# Patient Record
Sex: Female | Born: 1935 | Race: White | Hispanic: No | Smoking: Never smoker
Health system: Southern US, Community
[De-identification: ages and names within clinical notes are randomized; demographics above are authoritative.]

## PROBLEM LIST (undated history)

## (undated) DIAGNOSIS — IMO0002 Reserved for concepts with insufficient information to code with codable children: Secondary | ICD-10-CM

## (undated) DIAGNOSIS — E039 Hypothyroidism, unspecified: Secondary | ICD-10-CM

## (undated) DIAGNOSIS — I480 Paroxysmal atrial fibrillation: Secondary | ICD-10-CM

## (undated) DIAGNOSIS — Z972 Presence of dental prosthetic device (complete) (partial): Secondary | ICD-10-CM

## (undated) DIAGNOSIS — I10 Essential (primary) hypertension: Secondary | ICD-10-CM

## (undated) DIAGNOSIS — R58 Hemorrhage, not elsewhere classified: Secondary | ICD-10-CM

## (undated) DIAGNOSIS — J449 Chronic obstructive pulmonary disease, unspecified: Secondary | ICD-10-CM

## (undated) DIAGNOSIS — G4700 Insomnia, unspecified: Secondary | ICD-10-CM

## (undated) DIAGNOSIS — I82409 Acute embolism and thrombosis of unspecified deep veins of unspecified lower extremity: Secondary | ICD-10-CM

## (undated) DIAGNOSIS — R578 Other shock: Secondary | ICD-10-CM

## (undated) DIAGNOSIS — C3400 Malignant neoplasm of unspecified main bronchus: Secondary | ICD-10-CM

## (undated) DIAGNOSIS — I499 Cardiac arrhythmia, unspecified: Secondary | ICD-10-CM

## (undated) DIAGNOSIS — I272 Pulmonary hypertension, unspecified: Secondary | ICD-10-CM

## (undated) DIAGNOSIS — C3490 Malignant neoplasm of unspecified part of unspecified bronchus or lung: Secondary | ICD-10-CM

## (undated) DIAGNOSIS — I2699 Other pulmonary embolism without acute cor pulmonale: Secondary | ICD-10-CM

## (undated) DIAGNOSIS — I739 Peripheral vascular disease, unspecified: Secondary | ICD-10-CM

## (undated) DIAGNOSIS — E079 Disorder of thyroid, unspecified: Secondary | ICD-10-CM

## (undated) DIAGNOSIS — J9621 Acute and chronic respiratory failure with hypoxia: Secondary | ICD-10-CM

## (undated) DIAGNOSIS — K219 Gastro-esophageal reflux disease without esophagitis: Secondary | ICD-10-CM

## (undated) DIAGNOSIS — M1990 Unspecified osteoarthritis, unspecified site: Secondary | ICD-10-CM

## (undated) HISTORY — DX: Chronic obstructive pulmonary disease, unspecified: J44.9

## (undated) HISTORY — DX: Gastro-esophageal reflux disease without esophagitis: K21.9

## (undated) HISTORY — PX: TONSILLECTOMY: SUR1361

## (undated) HISTORY — DX: Disorder of thyroid, unspecified: E07.9

## (undated) HISTORY — DX: Peripheral vascular disease, unspecified: I73.9

## (undated) HISTORY — DX: Reserved for concepts with insufficient information to code with codable children: IMO0002

## (undated) HISTORY — DX: Essential (primary) hypertension: I10

---

## 1982-07-01 HISTORY — PX: ABDOMINAL HYSTERECTOMY: SHX81

## 2006-07-01 DIAGNOSIS — I82409 Acute embolism and thrombosis of unspecified deep veins of unspecified lower extremity: Secondary | ICD-10-CM

## 2006-07-01 DIAGNOSIS — I2699 Other pulmonary embolism without acute cor pulmonale: Secondary | ICD-10-CM

## 2006-07-01 DIAGNOSIS — I480 Paroxysmal atrial fibrillation: Secondary | ICD-10-CM

## 2006-07-01 HISTORY — DX: Other pulmonary embolism without acute cor pulmonale: I26.99

## 2006-07-01 HISTORY — DX: Acute embolism and thrombosis of unspecified deep veins of unspecified lower extremity: I82.409

## 2006-07-01 HISTORY — DX: Paroxysmal atrial fibrillation: I48.0

## 2013-01-29 HISTORY — PX: EYE SURGERY: SHX253

## 2013-03-31 HISTORY — PX: EYE SURGERY: SHX253

## 2013-06-08 DIAGNOSIS — IMO0002 Reserved for concepts with insufficient information to code with codable children: Secondary | ICD-10-CM

## 2013-06-08 HISTORY — DX: Reserved for concepts with insufficient information to code with codable children: IMO0002

## 2013-06-09 ENCOUNTER — Other Ambulatory Visit: Payer: Self-pay | Admitting: *Deleted

## 2013-06-09 DIAGNOSIS — L97909 Non-pressure chronic ulcer of unspecified part of unspecified lower leg with unspecified severity: Secondary | ICD-10-CM

## 2013-06-21 ENCOUNTER — Encounter: Payer: Self-pay | Admitting: Vascular Surgery

## 2013-06-22 ENCOUNTER — Encounter: Payer: Self-pay | Admitting: Vascular Surgery

## 2013-06-23 ENCOUNTER — Encounter: Payer: Self-pay | Admitting: Vascular Surgery

## 2013-06-23 ENCOUNTER — Encounter (HOSPITAL_COMMUNITY): Payer: Medicare Other

## 2013-06-23 ENCOUNTER — Ambulatory Visit (INDEPENDENT_AMBULATORY_CARE_PROVIDER_SITE_OTHER): Payer: Medicare Other | Admitting: Vascular Surgery

## 2013-06-23 ENCOUNTER — Ambulatory Visit (HOSPITAL_COMMUNITY)
Admission: RE | Admit: 2013-06-23 | Discharge: 2013-06-23 | Disposition: A | Discharge status: Home / Self Care | Payer: Medicare Other | Source: Ambulatory Visit | Attending: Vascular Surgery | Admitting: Vascular Surgery

## 2013-06-23 ENCOUNTER — Encounter (INDEPENDENT_AMBULATORY_CARE_PROVIDER_SITE_OTHER): Payer: Self-pay

## 2013-06-23 VITALS — BP 125/66 | HR 80 | Resp 16 | Ht 66.5 in | Wt 159.0 lb

## 2013-06-23 DIAGNOSIS — I7025 Atherosclerosis of native arteries of other extremities with ulceration: Secondary | ICD-10-CM | POA: Insufficient documentation

## 2013-06-23 DIAGNOSIS — I872 Venous insufficiency (chronic) (peripheral): Secondary | ICD-10-CM | POA: Insufficient documentation

## 2013-06-23 DIAGNOSIS — I83009 Varicose veins of unspecified lower extremity with ulcer of unspecified site: Secondary | ICD-10-CM | POA: Insufficient documentation

## 2013-06-23 DIAGNOSIS — L97909 Non-pressure chronic ulcer of unspecified part of unspecified lower leg with unspecified severity: Secondary | ICD-10-CM

## 2013-06-23 DIAGNOSIS — I809 Phlebitis and thrombophlebitis of unspecified site: Secondary | ICD-10-CM | POA: Insufficient documentation

## 2013-06-23 DIAGNOSIS — I739 Peripheral vascular disease, unspecified: Secondary | ICD-10-CM | POA: Insufficient documentation

## 2013-06-23 NOTE — Assessment & Plan Note (Signed)
This patient has evidence of bilateral chronic venous insufficiency. She has a small venous ulcer adjacent to her left medial malleolus. I have recommended dressing changes with wet-to-dry 2 x 2 with normal saline and then a discussed with her the importance of elevation and compression therapy. I've discussed the proper positioning for elevation. Until the ulcer is healed I have recommended that she was a 4 inch and 6 inch Ace on the left leg with graduated compression. Once the ulcer is healed she can get back in her compression stockings. I have explained that she could be considered for laser ablation of left greater saphenous vein but it would be worth a trial of thigh-high compression stockings for 3 months before proceeding with this. She states that she simply cannot wear the thigh-high stockings. I plan on seeing her back in 6 weeks. She knows to call sooner if she has problems. Based on the fact that she has biphasic Doppler signals in both feet I did not think an arterial workup was indicated at this time.

## 2013-06-23 NOTE — Progress Notes (Signed)
[facility] and [facility] Specialist of [facility]  Patient name: Brittney Thomas MRN: [id number] DOB: [birth year] Sex: female  REASON FOR CONSULT: Venous stasis ulcer left leg.  HPI: Brittney Thomas is a 79 y.o. female states that for approximately 2 weeks she has had an ulcer on the medial aspect of her left ankle. She had one previous ulcer 6 years ago which healed on its own. She does have a history of chronic venous insufficiency. She had a DVT of both lower extremities in a pulmonary embolus 6 years ago that was treated in [place]. She has been on Coumadin since that time. She developed a small wound on her left medial malleolus approximately 2 weeks ago according to the patient. She does not remember any specific injury to her leg. She does normally wear compression stockings but has not been wearing stockings on the left because of the ulcer. I have reviewed her records from the referring physician's office. She was seen on 05/31/2013 with this ulcer on her left medial malleolus. Her INR has been therapeutic on her Coumadin.  Her past medical history is noted to be significant for hypertension, hypothyroidism, history of esophageal reflux, and COPD.   Past Medical History  Diagnosis Date  . Ulcer 06-08-13    Left medial ankle  . Hypertension   . Thyroid disease     Hypo-Thyroidism  . Esophageal reflux   . COPD (chronic obstructive pulmonary disease)   . Peripheral vascular disease    History reviewed. No pertinent family history. SOCIAL HISTORY: History  Substance Use Topics  . Smoking status: Never Smoker   . Smokeless tobacco: Never Used  . Alcohol Use: No   Allergies  Allergen Reactions  . Poison Ivy Extract [employer] Poison Ivy]   . Sulfa Antibiotics   . Sulfacetamide Sodium    Current Outpatient Prescriptions  Medication Sig Dispense Refill  . acetaZOLAMIDE (DIAMOX) 250 MG tablet Take 250 mg by mouth 3 (three) times daily.      [name] amoxicillin (AMOXIL) 875 MG tablet       .  calcium-vitamin D (OSCAL WITH D) 500-200 MG-UNIT per tablet Take 1 tablet by mouth.      . carvedilol (COREG) 6.25 MG tablet Take 6.25 mg by mouth 2 (two) times daily with a meal.      . digoxin (LANOXIN) 0.25 MG tablet Take 0.25 mg by mouth daily.      . furosemide (LASIX) 40 MG tablet Take 40 mg by mouth.      . levothyroxine (SYNTHROID, LEVOTHROID) 88 MCG tablet Take 88 mcg by mouth daily before breakfast.      . lisinopril (PRINIVIL,ZESTRIL) 10 MG tablet Take 10 mg by mouth daily.      . Omega-3 Fatty Acids (SALMON OIL-1000 PO) Take by mouth.      . theophylline (THEO-24) 200 MG 24 hr capsule Take 200 mg by mouth daily.      [name] warfarin (COUMADIN) 2 MG tablet Take 2 mg by mouth daily.      [name] LORazepam (ATIVAN) 1 MG tablet Take 1 mg by mouth every 8 (eight) hours.       No current facility-administered medications for this visit.   REVIEW OF SYSTEMS: Arly.Keller ] denotes positive finding; [  ] denotes negative finding  CARDIOVASCULAR:  [ ]  chest pain   [ ]  chest pressure   [ ]  palpitations   [ ]  orthopnea   [ ]  dyspnea on exertion   [ ]  claudication   [ ]   rest pain   Arly.Keller ] DVT   [ ]  phlebitis PULMONARY:   [ ]  productive cough   [ ]  asthma   [ ]  wheezing NEUROLOGIC:   [ ]  weakness  [ ]  paresthesias  [ ]  aphasia  [ ]  amaurosis  [ ]  dizziness HEMATOLOGIC:   [ ]  bleeding problems   [ ]  clotting disorders MUSCULOSKELETAL:  [ ]  joint pain   [ ]  joint swelling Arly.Keller ] leg swelling GASTROINTESTINAL: [ ]   blood in stool  [ ]   hematemesis GENITOURINARY:  [ ]   dysuria  [ ]   hematuria PSYCHIATRIC:  [ ]  history of major depression INTEGUMENTARY:  [ ]  rashes  Arly.Keller ] ulcers CONSTITUTIONAL:  [ ]  fever   [ ]  chills  PHYSICAL EXAM: Filed Vitals:   06/23/13 1458  BP: 125/66  Pulse: 80  Resp: 16  Height: 5' 6.5" (1.689 m)  Weight: 159 lb (72.122 kg)  SpO2: 94%   Body mass index is 25.28 kg/(m^2). GENERAL: The patient is a well-nourished female, in no acute distress. The vital signs are documented  above. CARDIOVASCULAR: There is a regular rate and rhythm. I do not detect carotid bruits. She has palpable femoral pulses popliteal pulses and posterior tibial pulses bilaterally. There is mild bilateral lower extremity swelling. I was able to obtain biphasic dorsalis pedis and posterior tibial signals bilaterally. PULMONARY: There is good air exchange bilaterally without wheezing or rales. ABDOMEN: Soft and non-tender with normal pitched bowel sounds.  MUSCULOSKELETAL: There are no major deformities or cyanosis. NEUROLOGIC: No focal weakness or paresthesias are detected. SKIN: She has hyperpigmentation bilaterally consistent with chronic venous insufficiency. There is a small ulcer adjacent to her left medial malleolus which measures 1 cm in width and 2 cm in height. PSYCHIATRIC: The patient has a normal affect.  DATA:  I have independently interpreted her venous duplex scan. She has no evidence of DVT in the left lower extremity. She has no deep vein reflux on the left. She does have significant reflux in her left greater saphenous vein.  MEDICAL ISSUES:  Chronic venous insufficiency This patient has evidence of bilateral chronic venous insufficiency. She has a small venous ulcer adjacent to her left medial malleolus. I have recommended dressing changes with wet-to-dry 2 x 2 with normal saline and then a discussed with her the importance of elevation and compression therapy. I've discussed the proper positioning for elevation. Until the ulcer is healed I have recommended that she was a 4 inch and 6 inch Ace on the left leg with graduated compression. Once the ulcer is healed she can get back in her compression stockings. I have explained that she could be considered for laser ablation of left greater saphenous vein but it would be worth a trial of thigh-high compression stockings for 3 months before proceeding with this. She states that she simply cannot wear the thigh-high stockings. I plan on  seeing her back in 6 weeks. She knows to call sooner if she has problems. Based on the fact that she has biphasic Doppler signals in both feet I did not think an arterial workup was indicated at this time.   Braxson Hollingsworth S [facility] [place] Beeper: [phone]

## 2013-06-23 NOTE — Progress Notes (Signed)
[facility]ULAR [facility]IN SPECIALISTS OF [place]    

## 2013-07-12 ENCOUNTER — Encounter (HOSPITAL_COMMUNITY): Payer: Medicare Other

## 2013-07-12 ENCOUNTER — Encounter: Payer: Medicare Other | Admitting: Surgery

## 2013-08-04 ENCOUNTER — Ambulatory Visit: Payer: Medicare Other | Admitting: Vascular Surgery

## 2013-08-16 ENCOUNTER — Encounter: Payer: Self-pay | Admitting: Vascular Surgery

## 2013-08-17 ENCOUNTER — Encounter: Payer: Self-pay | Admitting: Vascular Surgery

## 2013-08-18 ENCOUNTER — Ambulatory Visit: Payer: Medicare Other | Admitting: Vascular Surgery

## 2013-08-24 ENCOUNTER — Encounter: Payer: Self-pay | Admitting: Vascular Surgery

## 2013-08-25 ENCOUNTER — Encounter: Payer: Self-pay | Admitting: Vascular Surgery

## 2013-08-25 ENCOUNTER — Ambulatory Visit (INDEPENDENT_AMBULATORY_CARE_PROVIDER_SITE_OTHER): Payer: Medicare Other | Admitting: Vascular Surgery

## 2013-08-25 VITALS — BP 163/92 | HR 68 | Ht 66.5 in | Wt 165.5 lb

## 2013-08-25 DIAGNOSIS — I872 Venous insufficiency (chronic) (peripheral): Secondary | ICD-10-CM

## 2013-08-25 NOTE — Assessment & Plan Note (Signed)
Her left leg ulcer has now completely healed. I have encouraged her to elevate her legs intermittently. In addition we have discussed the importance of keeping her legs well lubricated especially in the winter. I have also written her a prescription for knee-high compression stockings with a gradient of 15-20 mm mercury. I will see her back as needed.

## 2013-08-25 NOTE — Progress Notes (Signed)
   Patient name: Brittney Thomas MRN: [id number] DOB: [birth year] Sex: female  REASON FOR VISIT: Follow up a venous stasis ulcer.  HPI: Brittney Thomas is a 80 y.o. female who I saw on 06/23/2013 with a venous stasis ulcer of her left leg. She comes in for a 6 week follow up visit. Since I saw her last, the wound on her left leg has completely healed. She denies fever or chills.  REVIEW OF SYSTEMS: Valu.Nieves ] denotes positive finding; [  ] denotes negative finding  CARDIOVASCULAR:  [ ]  chest pain   [ ]  dyspnea on exertion    CONSTITUTIONAL:  [ ]  fever   [ ]  chills  PHYSICAL EXAM: Filed Vitals:   08/25/13 1404  BP: 163/92  Pulse: 68  Height: 5' 6.5" (1.689 m)  Weight: 165 lb 8 oz (75.07 kg)  SpO2: 98%   Body mass index is 26.32 kg/(m^2). GENERAL: The patient is a well-nourished female, in no acute distress. The vital signs are documented above. CARDIOVASCULAR: There is a regular rate and rhythm. PULMONARY: There is good air exchange bilaterally without wheezing or rales. She has varicose veins bilaterally and hyperpigmentation. The wound on her left medial malleolus is completely healed.  MEDICAL ISSUES:  Chronic venous insufficiency Her left leg ulcer has now completely healed. I have encouraged her to elevate her legs intermittently. In addition we have discussed the importance of keeping her legs well lubricated especially in the winter. I have also written her a prescription for knee-high compression stockings with a gradient of 15-20 mm mercury. I will see her back as needed.   [place] [facility] [place] Beeper: [phone]

## 2014-03-09 ENCOUNTER — Other Ambulatory Visit (HOSPITAL_COMMUNITY): Payer: Self-pay | Admitting: Internal Medicine

## 2014-03-09 DIAGNOSIS — R911 Solitary pulmonary nodule: Secondary | ICD-10-CM

## 2014-03-18 ENCOUNTER — Encounter (HOSPITAL_COMMUNITY): Payer: Self-pay

## 2014-03-18 ENCOUNTER — Ambulatory Visit (HOSPITAL_COMMUNITY)
Admission: RE | Admit: 2014-03-18 | Discharge: 2014-03-18 | Disposition: A | Discharge status: Home / Self Care | Payer: Medicare Other | Source: Ambulatory Visit | Attending: Internal Medicine | Admitting: Internal Medicine

## 2014-03-18 DIAGNOSIS — K8020 Calculus of gallbladder without cholecystitis without obstruction: Secondary | ICD-10-CM | POA: Insufficient documentation

## 2014-03-18 DIAGNOSIS — K7689 Other specified diseases of liver: Secondary | ICD-10-CM | POA: Diagnosis not present

## 2014-03-18 DIAGNOSIS — R918 Other nonspecific abnormal finding of lung field: Secondary | ICD-10-CM | POA: Insufficient documentation

## 2014-03-18 DIAGNOSIS — R911 Solitary pulmonary nodule: Secondary | ICD-10-CM

## 2014-03-18 DIAGNOSIS — R599 Enlarged lymph nodes, unspecified: Secondary | ICD-10-CM | POA: Insufficient documentation

## 2014-03-18 DIAGNOSIS — M899 Disorder of bone, unspecified: Secondary | ICD-10-CM | POA: Diagnosis not present

## 2014-03-18 DIAGNOSIS — M949 Disorder of cartilage, unspecified: Secondary | ICD-10-CM

## 2014-03-18 DIAGNOSIS — I517 Cardiomegaly: Secondary | ICD-10-CM | POA: Diagnosis not present

## 2014-03-18 DIAGNOSIS — E041 Nontoxic single thyroid nodule: Secondary | ICD-10-CM | POA: Diagnosis not present

## 2014-03-18 DIAGNOSIS — K5730 Diverticulosis of large intestine without perforation or abscess without bleeding: Secondary | ICD-10-CM | POA: Diagnosis not present

## 2014-03-18 LAB — GLUCOSE, CAPILLARY: GLUCOSE-CAPILLARY: 99 mg/dL (ref 70–99)

## 2014-03-18 MED ORDER — FLUDEOXYGLUCOSE F - 18 (FDG) INJECTION: 8.1000 | Freq: Once | INTRAVENOUS | Status: AC | PRN

## 2014-03-21 ENCOUNTER — Ambulatory Visit (HOSPITAL_COMMUNITY): Payer: Medicare Other

## 2014-03-31 ENCOUNTER — Encounter: Payer: Self-pay | Admitting: Pulmonary Disease

## 2014-04-08 ENCOUNTER — Institutional Professional Consult (permissible substitution): Payer: Medicare Other | Admitting: Pulmonary Disease

## 2014-04-15 ENCOUNTER — Encounter (HOSPITAL_COMMUNITY): Payer: Self-pay | Admitting: Pulmonary Disease

## 2014-04-15 ENCOUNTER — Other Ambulatory Visit (HOSPITAL_COMMUNITY): Payer: Self-pay | Admitting: Cardiology

## 2014-04-15 ENCOUNTER — Ambulatory Visit (INDEPENDENT_AMBULATORY_CARE_PROVIDER_SITE_OTHER): Payer: Medicare Other | Admitting: Pulmonary Disease

## 2014-04-15 ENCOUNTER — Ambulatory Visit (HOSPITAL_COMMUNITY): Payer: Medicare Other | Attending: Cardiovascular Disease | Admitting: Cardiology

## 2014-04-15 ENCOUNTER — Other Ambulatory Visit (INDEPENDENT_AMBULATORY_CARE_PROVIDER_SITE_OTHER): Payer: Medicare Other

## 2014-04-15 ENCOUNTER — Encounter: Payer: Self-pay | Admitting: Pulmonary Disease

## 2014-04-15 ENCOUNTER — Encounter (HOSPITAL_COMMUNITY): Payer: Self-pay

## 2014-04-15 ENCOUNTER — Ambulatory Visit (INDEPENDENT_AMBULATORY_CARE_PROVIDER_SITE_OTHER): Payer: Medicare Other

## 2014-04-15 VITALS — BP 144/82 | HR 116 | Ht 65.0 in | Wt 163.0 lb

## 2014-04-15 DIAGNOSIS — M25562 Pain in left knee: Secondary | ICD-10-CM

## 2014-04-15 DIAGNOSIS — I82812 Embolism and thrombosis of superficial veins of left lower extremities: Secondary | ICD-10-CM

## 2014-04-15 DIAGNOSIS — Z79899 Other long term (current) drug therapy: Secondary | ICD-10-CM | POA: Insufficient documentation

## 2014-04-15 DIAGNOSIS — D6852 Prothrombin gene mutation: Secondary | ICD-10-CM

## 2014-04-15 DIAGNOSIS — C3490 Malignant neoplasm of unspecified part of unspecified bronchus or lung: Secondary | ICD-10-CM

## 2014-04-15 DIAGNOSIS — I824Y2 Acute embolism and thrombosis of unspecified deep veins of left proximal lower extremity: Secondary | ICD-10-CM

## 2014-04-15 DIAGNOSIS — I482 Chronic atrial fibrillation, unspecified: Secondary | ICD-10-CM

## 2014-04-15 DIAGNOSIS — M7989 Other specified soft tissue disorders: Secondary | ICD-10-CM | POA: Insufficient documentation

## 2014-04-15 DIAGNOSIS — M79605 Pain in left leg: Secondary | ICD-10-CM | POA: Insufficient documentation

## 2014-04-15 DIAGNOSIS — I4891 Unspecified atrial fibrillation: Secondary | ICD-10-CM | POA: Insufficient documentation

## 2014-04-15 DIAGNOSIS — M79652 Pain in left thigh: Secondary | ICD-10-CM

## 2014-04-15 DIAGNOSIS — Z86718 Personal history of other venous thrombosis and embolism: Secondary | ICD-10-CM | POA: Diagnosis not present

## 2014-04-15 DIAGNOSIS — E041 Nontoxic single thyroid nodule: Secondary | ICD-10-CM

## 2014-04-15 DIAGNOSIS — M79662 Pain in left lower leg: Secondary | ICD-10-CM

## 2014-04-15 DIAGNOSIS — I824Y9 Acute embolism and thrombosis of unspecified deep veins of unspecified proximal lower extremity: Secondary | ICD-10-CM | POA: Insufficient documentation

## 2014-04-15 DIAGNOSIS — D6859 Other primary thrombophilia: Secondary | ICD-10-CM

## 2014-04-15 DIAGNOSIS — R918 Other nonspecific abnormal finding of lung field: Secondary | ICD-10-CM

## 2014-04-15 HISTORY — DX: Malignant neoplasm of unspecified part of unspecified bronchus or lung: C34.90

## 2014-04-15 LAB — CBC WITH DIFFERENTIAL/PLATELET
BASOS ABS: 0.1 10*3/uL (ref 0.0–0.1)
Basophils Relative: 1.6 % (ref 0.0–3.0)
Eosinophils Absolute: 0.1 10*3/uL (ref 0.0–0.7)
Eosinophils Relative: 1.5 % (ref 0.0–5.0)
HEMATOCRIT: 43.9 % (ref 36.0–46.0)
Hemoglobin: 14 g/dL (ref 12.0–15.0)
LYMPHS ABS: 0.8 10*3/uL (ref 0.7–4.0)
Lymphocytes Relative: 14.9 % (ref 12.0–46.0)
MCHC: 32 g/dL (ref 30.0–36.0)
MCV: 100.3 fl — ABNORMAL HIGH (ref 78.0–100.0)
MONOS PCT: 10.5 % (ref 3.0–12.0)
Monocytes Absolute: 0.6 10*3/uL (ref 0.1–1.0)
Neutro Abs: 3.8 10*3/uL (ref 1.4–7.7)
Neutrophils Relative %: 71.5 % (ref 43.0–77.0)
PLATELETS: 206 10*3/uL (ref 150.0–400.0)
RBC: 4.37 Mil/uL (ref 3.87–5.11)
RDW: 15.7 % — ABNORMAL HIGH (ref 11.5–15.5)
WBC: 5.3 10*3/uL (ref 4.0–10.5)

## 2014-04-15 LAB — PROTIME-INR
INR: 3.4 ratio — AB (ref 0.8–1.0)
PROTHROMBIN TIME: 36.7 s — AB (ref 9.6–13.1)

## 2014-04-15 LAB — POCT INR: INR: 3.5

## 2014-04-15 NOTE — Addendum Note (Signed)
Addended by: Len Blalock on: 04/15/2014 04:56 PM   Modules accepted: Orders

## 2014-04-15 NOTE — Progress Notes (Signed)
Weight today 74 kg, creatine 1.15 on 11/16/13 at primary care office.  Creatine clearance is 46.34 will bridge with Lovenox 80mg  every 12 hrs.

## 2014-04-15 NOTE — Patient Instructions (Addendum)
Stop your Coumadin today, No Coumadin.   04/16/14 Start taking Lovenox 80mg  once in the am, Lovenox 80mg  once in the pm 04/17/14 Lovenox 80mg  once in the am, Lovenox 80mg  once in the pm 04/18/14 Lovenox 80mg  once in the am, Lovenox 80mg  once in the pm 04/19/14 Take your last dosage of Lovenox 80mg  in the am, No Lovenox in the pm prior to procedure on 04/20/14. 04/20/14-Procedure Day No Lovenox.  Resume Lovenox twice daily after procedure once MD states safe to do so, and continue every 12 hrs.

## 2014-04-15 NOTE — Progress Notes (Signed)
Per RN in Coumadin clinic who personally talked with Dr. Lake Bells - he requested that patient start on Lovenox tomorrow full dose and he is aware that INR is 3.5 today.

## 2014-04-15 NOTE — Progress Notes (Signed)
Subjective:    Patient ID: Brittney Thomas, female    DOB: 1933-04-01, 80 y.o.   MRN: [id number]  HPI Chief Complaint  Patient presents with  . Advice Only    Referred by Dr. Delice Thomas for pulm nodules.      Brittney Thomas tells me that she fell in mid August and had a severe bruise and hematoma in her left leg.  Since then the swelling has improved but she has developed thigh pain and tenderness in the left leg. She hsa been on warfarin since 2008 when she had a DVT/PE and Afib.  Recently her warfarin was held briefly and then restarted with the hematoma in her leg.  She was off of warfarin for about 10-14 days and has been back on that medicine by 03/01/2014.  Around this time she began to become worried about having clots in her lungs and so she requested evaluation.  She had a CT angiogram which showed a mass so a PET CT was ordered which showed likely metastatic disease.    She tells me that in the last year she sometimes feels that she can't take a deep breath, and she has mild dyspnea on exertoin.  She can walk without having to stop but she has to pace herself.  Before the fall she was participating in aerobics three times a week in the senior center.    She was diagnosed with COPD probably 4-5 years ago when she was admitted for a respiratory problem.  No problems with cough or bronchitis recently.    She lost about 5 pounds when she injured herself and this has been stable.   Past Medical History  Diagnosis Date  . Ulcer 06-08-13    Left medial ankle  . Hypertension   . Thyroid disease     Hypo-Thyroidism  . Esophageal reflux   . COPD (chronic obstructive pulmonary disease)   . Peripheral vascular disease      Family History  Problem Relation Age of Onset  . Heart disease Maternal Grandmother   . Cancer Brother     kidney  . Stroke Brother      History   Social History  . Marital Status: Widowed    Spouse Name: N/A    Number of Children: N/A  . Years of Education: N/A    Occupational History  . Not on file.   Social History Main Topics  . Smoking status: Never Smoker   . Smokeless tobacco: Never Used  . Alcohol Use: No  . Drug Use: No  . Sexual Activity: Not on file   Other Topics Concern  . Not on file   Social History Narrative  . No narrative on file     Allergies  Allergen Reactions  . Poison Ivy Extract [employer] Poison Ivy]   . Sulfa Antibiotics   . Sulfacetamide Sodium      Outpatient Prescriptions Prior to Visit  Medication Sig Dispense Refill  . acetaZOLAMIDE (DIAMOX) 250 MG tablet Take 250 mg by mouth 3 (three) times daily.      . calcium-vitamin D (OSCAL WITH D) 500-200 MG-UNIT per tablet Take 1 tablet by mouth.      . carvedilol (COREG) 6.25 MG tablet Take 6.25 mg by mouth 2 (two) times daily with a meal.      . digoxin (LANOXIN) 0.25 MG tablet Take 0.25 mg by mouth daily.      . furosemide (LASIX) 40 MG tablet Take 40 mg by mouth.      [name]  levothyroxine (SYNTHROID, LEVOTHROID) 88 MCG tablet Take 88 mcg by mouth daily before breakfast.      . lisinopril (PRINIVIL,ZESTRIL) 10 MG tablet Take 10 mg by mouth daily.      [name] LORazepam (ATIVAN) 1 MG tablet Take 1 mg by mouth every 8 (eight) hours as needed.       . Omega-3 Fatty Acids (SALMON OIL-1000 PO) Take by mouth.      . theophylline (THEO-24) 200 MG 24 hr capsule Take 200 mg by mouth daily.      [name] warfarin (COUMADIN) 2 MG tablet Take 2 mg by mouth daily.      [name] amoxicillin (AMOXIL) 875 MG tablet        No facility-administered medications prior to visit.      Review of Systems  Constitutional: Negative for fever and unexpected weight change.  HENT: Positive for postnasal drip. Negative for congestion, dental problem, ear pain, nosebleeds, rhinorrhea, sinus pressure, sneezing, sore throat and trouble swallowing.   Eyes: Negative for redness and itching.  Respiratory: Positive for shortness of breath. Negative for cough, chest tightness and wheezing.   Cardiovascular:  Negative for palpitations and leg swelling.  Gastrointestinal: Negative for nausea and vomiting.  Genitourinary: Negative for dysuria.  Musculoskeletal: Negative for joint swelling.  Skin: Negative for rash.  Neurological: Negative for headaches.  Hematological: Does not bruise/bleed easily.  Psychiatric/Behavioral: Negative for dysphoric mood. The patient is not nervous/anxious.        Objective:   Physical Exam Filed Vitals:   04/15/14 0941  BP: 144/82  Pulse: 116  Height: 5\' 5"  (1.651 m)  Weight: 163 lb (73.936 kg)  SpO2: 94%   RA  Gen: chronically ill appearing, no acute distress HEENT: NCAT, PERRL, EOMi, OP clear, thyroid nodule palpable left side PULM: CTA B CV: RRR, no mgr, no JVD AB: BS+, soft, nontender, no hsm Ext: left leg swelling and bruising around knee, palpable tenderness left medial thigh Derm: no rash or skin breakdown Neuro: A&Ox4, CN II-XII intact, strength 5/5 in all 4 extremities  03/18/2014 PET CT images reviewed by me> extensive hypermetabolic mediastinal lymphadenopathy, left upper lobe lung mass, vertebral body lesion     Assessment & Plan:   Superficial thrombophlebitis Today we sent her for a left leg ultrasound because of her leg pain and swelling and she was found to have a large SUPERFICIAL left leg clot.  I am concerned that with her malignancy she may be hypercoagulable as this occurred in the setting of her INR of 3.5.   I am uncomfortable bridging her off of lovenox and I think in the setting of her malignancy she likely needs to be anticoagulated with lovenox anyway.  Plan: -refer to coumadin clnic, bridge indefinitely with lovenox -follow up with Heme-Onc for further instructions after we have a tissue diagnosis for her cancer  Lung mass I have reviewed the PET CT images with the patient and her daughter in clinic.  This finding is very worrisome for malignancy.  In the setting of never smoking, I am concerned about the possibility of  adenocarcinoma.  Normally I would have the vertebral body biopsied, but these specimens cannot undergo genetic testing due to sample processing issues.  So we need to obtain tissue from her chest.  Plan: -EBUS and ENB planned for next week at [facility] -bridge with lovenox as noted above  Thyroid nodule This was worrisome in appearance on the PET CT, but she apparently underwent a thyroid ultrasound last week and  was told that she didn't need a biopsy.  We will try to get records of this from her PCP.    Updated Medication List Outpatient Encounter Prescriptions as of 04/15/2014  Medication Sig  . acetaZOLAMIDE (DIAMOX) 250 MG tablet Take 250 mg by mouth 3 (three) times daily.  . calcium-vitamin D (OSCAL WITH D) 500-200 MG-UNIT per tablet Take 1 tablet by mouth.  . carvedilol (COREG) 6.25 MG tablet Take 6.25 mg by mouth 2 (two) times daily with a meal.  . digoxin (LANOXIN) 0.25 MG tablet Take 0.25 mg by mouth daily.  . furosemide (LASIX) 40 MG tablet Take 40 mg by mouth.  . levothyroxine (SYNTHROID, LEVOTHROID) 88 MCG tablet Take 88 mcg by mouth daily before breakfast.  . lisinopril (PRINIVIL,ZESTRIL) 10 MG tablet Take 10 mg by mouth daily.  [name] LORazepam (ATIVAN) 1 MG tablet Take 1 mg by mouth every 8 (eight) hours as needed.   . Omega-3 Fatty Acids (SALMON OIL-1000 PO) Take by mouth.  . theophylline (THEO-24) 200 MG 24 hr capsule Take 200 mg by mouth daily.  [name] warfarin (COUMADIN) 2 MG tablet Take 2 mg by mouth daily.  . [DISCONTINUED] amoxicillin (AMOXIL) 875 MG tablet

## 2014-04-15 NOTE — Progress Notes (Signed)
Lt LE Venous duplex performed. Prelim: Left GSV is thrombosed from the sapheno-femoral junction through to the ankle.  Results called.

## 2014-04-15 NOTE — Assessment & Plan Note (Signed)
Today we sent her for a left leg ultrasound because of her leg pain and swelling and she was found to have a large SUPERFICIAL left leg clot.  I am concerned that with her malignancy she may be hypercoagulable as this occurred in the setting of her INR of 3.5.   I am uncomfortable bridging her off of lovenox and I think in the setting of her malignancy she likely needs to be anticoagulated with lovenox anyway.  Plan: -refer to coumadin clnic, bridge indefinitely with lovenox -follow up with Heme-Onc for further instructions after we have a tissue diagnosis for her cancer

## 2014-04-15 NOTE — Assessment & Plan Note (Signed)
I have reviewed the PET CT images with the patient and her daughter in clinic.  This finding is very worrisome for malignancy.  In the setting of never smoking, I am concerned about the possibility of adenocarcinoma.  Normally I would have the vertebral body biopsied, but these specimens cannot undergo genetic testing due to sample processing issues.  So we need to obtain tissue from her chest.  Plan: -EBUS and ENB planned for next week at [facility] -bridge with lovenox as noted above

## 2014-04-15 NOTE — Patient Instructions (Addendum)
We are going to arrange an ultrasound of your left leg today We are going to arrange a bronchoscopy next week.   We will also make arrangements for you to come off of the warfarin We will see you back in 2 weeks or sooner if needed

## 2014-04-15 NOTE — Assessment & Plan Note (Signed)
This was worrisome in appearance on the PET CT, but she apparently underwent a thyroid ultrasound last week and was told that she didn't need a biopsy.  We will try to get records of this from her PCP.

## 2014-04-16 ENCOUNTER — Ambulatory Visit (INDEPENDENT_AMBULATORY_CARE_PROVIDER_SITE_OTHER): Payer: Medicare Other

## 2014-04-16 DIAGNOSIS — Z23 Encounter for immunization: Secondary | ICD-10-CM

## 2014-04-18 ENCOUNTER — Other Ambulatory Visit: Payer: Medicare Other

## 2014-04-19 ENCOUNTER — Ambulatory Visit (HOSPITAL_COMMUNITY): Admission: RE | Admit: 2014-04-19 | Payer: Medicare Other | Source: Ambulatory Visit

## 2014-04-19 ENCOUNTER — Encounter (HOSPITAL_COMMUNITY)
Admission: RE | Admit: 2014-04-19 | Discharge: 2014-04-19 | Disposition: A | Discharge status: Home / Self Care | Payer: Medicare Other | Source: Ambulatory Visit | Attending: Emergency Medicine | Admitting: Emergency Medicine

## 2014-04-19 ENCOUNTER — Ambulatory Visit (HOSPITAL_COMMUNITY)
Admission: RE | Admit: 2014-04-19 | Discharge: 2014-04-19 | Disposition: A | Discharge status: Home / Self Care | Payer: Medicare Other | Source: Ambulatory Visit | Attending: Pulmonary Disease | Admitting: Pulmonary Disease

## 2014-04-19 ENCOUNTER — Encounter (HOSPITAL_COMMUNITY): Payer: Self-pay

## 2014-04-19 ENCOUNTER — Telehealth: Payer: Self-pay | Admitting: Emergency Medicine

## 2014-04-19 DIAGNOSIS — R918 Other nonspecific abnormal finding of lung field: Secondary | ICD-10-CM

## 2014-04-19 DIAGNOSIS — E039 Hypothyroidism, unspecified: Secondary | ICD-10-CM | POA: Diagnosis not present

## 2014-04-19 DIAGNOSIS — K219 Gastro-esophageal reflux disease without esophagitis: Secondary | ICD-10-CM | POA: Diagnosis not present

## 2014-04-19 DIAGNOSIS — Z882 Allergy status to sulfonamides status: Secondary | ICD-10-CM | POA: Diagnosis not present

## 2014-04-19 DIAGNOSIS — Z7901 Long term (current) use of anticoagulants: Secondary | ICD-10-CM | POA: Diagnosis not present

## 2014-04-19 DIAGNOSIS — I10 Essential (primary) hypertension: Secondary | ICD-10-CM | POA: Diagnosis not present

## 2014-04-19 DIAGNOSIS — C3492 Malignant neoplasm of unspecified part of left bronchus or lung: Secondary | ICD-10-CM | POA: Diagnosis not present

## 2014-04-19 DIAGNOSIS — E041 Nontoxic single thyroid nodule: Secondary | ICD-10-CM | POA: Diagnosis not present

## 2014-04-19 DIAGNOSIS — R599 Enlarged lymph nodes, unspecified: Secondary | ICD-10-CM | POA: Diagnosis not present

## 2014-04-19 DIAGNOSIS — I739 Peripheral vascular disease, unspecified: Secondary | ICD-10-CM | POA: Diagnosis not present

## 2014-04-19 DIAGNOSIS — J449 Chronic obstructive pulmonary disease, unspecified: Secondary | ICD-10-CM | POA: Diagnosis present

## 2014-04-19 DIAGNOSIS — I4891 Unspecified atrial fibrillation: Secondary | ICD-10-CM | POA: Diagnosis not present

## 2014-04-19 HISTORY — DX: Acute embolism and thrombosis of unspecified deep veins of unspecified lower extremity: I82.409

## 2014-04-19 HISTORY — DX: Insomnia, unspecified: G47.00

## 2014-04-19 HISTORY — DX: Cardiac arrhythmia, unspecified: I49.9

## 2014-04-19 HISTORY — DX: Paroxysmal atrial fibrillation: I48.0

## 2014-04-19 HISTORY — DX: Hypothyroidism, unspecified: E03.9

## 2014-04-19 HISTORY — DX: Other pulmonary embolism without acute cor pulmonale: I26.99

## 2014-04-19 HISTORY — DX: Presence of dental prosthetic device (complete) (partial): Z97.2

## 2014-04-19 HISTORY — DX: Unspecified osteoarthritis, unspecified site: M19.90

## 2014-04-19 LAB — BASIC METABOLIC PANEL WITH GFR
Anion gap: 9 (ref 5–15)
BUN: 17 mg/dL (ref 6–23)
CO2: 31 meq/L (ref 19–32)
Calcium: 9.3 mg/dL (ref 8.4–10.5)
Chloride: 100 meq/L (ref 96–112)
Creatinine, Ser: 0.85 mg/dL (ref 0.50–1.10)
GFR calc Af Amer: 74 mL/min — ABNORMAL LOW
GFR calc non Af Amer: 63 mL/min — ABNORMAL LOW
Glucose, Bld: 101 mg/dL — ABNORMAL HIGH (ref 70–99)
Potassium: 4.2 meq/L (ref 3.7–5.3)
Sodium: 140 meq/L (ref 137–147)

## 2014-04-19 LAB — CBC
HCT: 43.4 % (ref 36.0–46.0)
Hemoglobin: 13.7 g/dL (ref 12.0–15.0)
MCH: 32.5 pg (ref 26.0–34.0)
MCHC: 31.6 g/dL (ref 30.0–36.0)
MCV: 103.1 fL — ABNORMAL HIGH (ref 78.0–100.0)
Platelets: 205 10*3/uL (ref 150–400)
RBC: 4.21 MIL/uL (ref 3.87–5.11)
RDW: 15.2 % (ref 11.5–15.5)
WBC: 5.1 10*3/uL (ref 4.0–10.5)

## 2014-04-19 NOTE — Progress Notes (Signed)
Anesthesia Chart Review:  Patient is a 80 year old female posted for video bronchoscopy with endobronchial ultrasound tomorrow by Dr. Lamonte Sakai. PAT was earlier today.  She was recently diagnosed with a LUL lung nodule that was hypermetabolic with left hilar and bilateral mediastinal lymphadenopathy with multiple skeletal lesions by 03/18/14 PET scan.  History includes non-smoker (exposure to second hand smoke), afib/PAF (saw cardiologist Dr. 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  PCP is [name], PA-C/Dr. Celedonio Miyamoto with [facility]--last visit for CPE on 11/16/13.  Primary pulmonologist is Dr. Simonne Maffucci.  He recommended she be bridged with Lovenox while off Coumadin for this procedure due to her chronic afib, history of DVT/PE, acute and occlusive superficial thrombus in the entire left GSV without evidence of LLE DVT by duplex 04/15/14, and concern for hypercoaguable state related to suspected malignancy.  Meds: Ativan, Omega 3 fatty acids, Diamox, Oscal with D, Coreg, Lanoxin, Lovenox (last dose 04/19/14 AM), Coumadin (on hold since 04/17/14), theophylline, lisinopril, levothyroxine, Lasix.  EKG on 04/19/14 is not yet confirmed by cardiology, but showed afib at 93 bpm, anteroseptal infarct (age undetermined).  I think there is also non-specific inferior T wave abnormality. Currently, there is no comparison EKG available. She had afib with non-specific T wave abnormality by 05/2007 EKGs in Care Everywhere but I do not have the actual tracings. No recent cardiac studies. Her PCP office told me that they do not have a prior EKG on file there. Her PAT RN did request old cardiology records, but without recent follow-up, I'm unsure of what will be available to send.  There are some 05/2007 records in [place].  05/19/07  echo (Care Everywhere) showed: Systemic hypertension. Atrial fibrillation. Contractile left ventricular dysfunction - moderate to severe. Decreased left ventricular ejection fraction (35-40%). Degenerative mitral valve disease. Mitral annular calcification. Mild mitral regurgitation. Severely dilated left atrium. Aortic sclerosis. Mild aortic regurgitation. Mildly dilated main pulmonary artery. Mild pulmonary regurgitation. Normal right ventricular contractile performance. Severe tricuspid regurgitation. Right ventricular and pulmonary arterial systolic pressure cannot be preicisely estimated from this examination but peak jet velocity is probably in the range of 2-2.5 m/s consistent with moderate elevation (40-45 mm Hg). Elevated central venous and right atrial pressures. Severely dilated right atrium.  CXR on 02/28/14 (PCP) showed: Probable 18 mm nodule at the left apex, otherwise no acute findings. Recommend chest CT to evaluate for bronchogenic carcinoma.  She is getting a super D Chest CT with contrast this afternoon.  Results are still pending.  Preoperative labs noted.  She is for PT/PTT on the day of surgery.    I was not asked to evaluate patient during her PAT visit, but she told her PAT RN that she was doing senior aerobics until she fell ~ late 01/2014.  She developed a leg hematoma, so her Coumadin was held for 10-14 days.  She was then concerned about recurrent PE and further chest imaging was ordered which found a incidental left lung lesion which led her to pulmonology and now this procedure.  She did not report any chest pain or new SOB at her visit.    I reviewed above with anesthesiologist Dr. Glennon Mac.  Patient with known afib since 2008, but no reported CAD history. EF was diminished on echo at that time, and previous long standing afib was suspected. To my knowledge, the  echo was not repeated or if so, not recently.  She had PCP evaluation earlier without mention of concern of acute CHF.   She was tolerating senior aerobics just a few months ago. There is now concern for lung cancer with need to confirm a definitive diagnosis.  Patient will be further evaluated by her assigned anesthesiologist on the day of surgery.  If HR is controlled and no acute CV/CHF symptoms then it is anticipated that she can undergo the planned procedure.  George Hugh [facility] Short Stay Center/Anesthesiology Phone [phone] 04/19/2014 3:42 PM

## 2014-04-19 NOTE — Telephone Encounter (Signed)
ATC # provided, NA and no option to leave msg, [facility]

## 2014-04-19 NOTE — Telephone Encounter (Signed)
ATC Debra at # provided, line rang busy. WCB

## 2014-04-19 NOTE — Pre-Procedure Instructions (Signed)
Brittney Thomas  04/19/2014   Your procedure is scheduled on:  Wednesday, October 21  Report to [facility] Admitting at [employer] AM.  Call this number if you have problems the morning of surgery: [phone]   Remember:   Do not eat food or drink liquids after midnight.Tuesday night   Take these medicines the morning of surgery with A SIP OF WATER: Carvedilol, digoxin,Levothyroxine, lorazepam if needed, theophylline, inhalers as needed.   Do not wear jewelry, make-up or nail polish.  Do not wear lotions, powders, or perfumes. You may wear deodorant.  Do not shave 48 hours prior to surgery. Men may shave face and neck.  Do not bring valuables to the hospital.  [facility] is not responsible   for any belongings or valuables.               Contacts, dentures or bridgework may not be worn into surgery.  Leave suitcase in the car. After surgery it may be brought to your room.  For patients admitted to the hospital, discharge time is determined by your   treatment team.               Patients discharged the day of surgery will not be allowed to drive  home.  Name and phone number of your driver:       Special Instructions: [place] - Preparing for Surgery  Before surgery, you can play an important role.  Because skin is not sterile, your skin needs to be as free of germs as possible.  You can reduce the number of germs on you skin by washing with CHG (chlorahexidine gluconate) soap before surgery.  CHG is an antiseptic cleaner which kills germs and bonds with the skin to continue killing germs even after washing.  Please DO NOT use if you have an allergy to CHG or antibacterial soaps.  If your skin becomes reddened/irritated stop using the CHG and inform your nurse when you arrive at Short Stay.  Do not shave (including legs and underarms) for at least 48 hours prior to the first CHG shower.  You may shave your face.  Please follow these instructions carefully:   1.  Shower  with CHG Soap the night before surgery and the   morning of Surgery.  2.  If you choose to wash your hair, wash your hair first as usual with your  normal shampoo.  3.  After you shampoo, rinse your hair and body thoroughly to remove the Shampoo.  4.  Use CHG as you would any other liquid soap.  You can apply chg directly   to the skin and wash gently with scrungie or a clean washcloth.  5.  Apply the CHG Soap to your body ONLY FROM THE NECK DOWN.    Do not use on open wounds or open sores.  Avoid contact with your eyes,   ears, mouth and genitals (private parts).  Wash genitals (private parts)   with your normal soap.  6.  Wash thoroughly, paying special attention to the area where your surgery   will be performed.  7.  Thoroughly rinse your body with warm water from the neck down.  8.  DO NOT shower/wash with your normal soap after using and rinsing off  the CHG Soap.  9.  Pat yourself dry with a clean towel.            10.  Wear clean pajamas.  11.  Place clean sheets on your bed the night of your first shower and do not  sleep with pets.  Day of Surgery  Do not apply any lotions/deoderants the morning of surgery.  Please wear clean clothes to the hospital/surgery center.     Please read over the following fact sheets that you were given: Pain Booklet, Coughing and Deep Breathing and Surgical Site Infection Prevention

## 2014-04-20 ENCOUNTER — Ambulatory Visit (HOSPITAL_COMMUNITY): Payer: Medicare Other

## 2014-04-20 ENCOUNTER — Ambulatory Visit (HOSPITAL_COMMUNITY)
Admission: RE | Admit: 2014-04-20 | Discharge: 2014-04-20 | Disposition: A | Discharge status: Home / Self Care | Payer: Medicare Other | Source: Ambulatory Visit | Attending: Emergency Medicine | Admitting: Emergency Medicine

## 2014-04-20 ENCOUNTER — Telehealth: Payer: Self-pay | Admitting: Emergency Medicine

## 2014-04-20 ENCOUNTER — Encounter (HOSPITAL_COMMUNITY): Payer: Self-pay | Admitting: Emergency Medicine

## 2014-04-20 ENCOUNTER — Encounter (HOSPITAL_COMMUNITY)
Admission: RE | Disposition: A | Discharge status: Home / Self Care | Payer: Self-pay | Source: Ambulatory Visit | Attending: Emergency Medicine

## 2014-04-20 ENCOUNTER — Encounter (HOSPITAL_COMMUNITY): Payer: Medicare Other | Admitting: Vascular Surgery

## 2014-04-20 ENCOUNTER — Ambulatory Visit (HOSPITAL_COMMUNITY): Payer: Medicare Other | Admitting: Anesthesiology

## 2014-04-20 DIAGNOSIS — K219 Gastro-esophageal reflux disease without esophagitis: Secondary | ICD-10-CM | POA: Insufficient documentation

## 2014-04-20 DIAGNOSIS — C3412 Malignant neoplasm of upper lobe, left bronchus or lung: Secondary | ICD-10-CM

## 2014-04-20 DIAGNOSIS — I10 Essential (primary) hypertension: Secondary | ICD-10-CM | POA: Insufficient documentation

## 2014-04-20 DIAGNOSIS — R918 Other nonspecific abnormal finding of lung field: Secondary | ICD-10-CM

## 2014-04-20 DIAGNOSIS — I4891 Unspecified atrial fibrillation: Secondary | ICD-10-CM | POA: Diagnosis not present

## 2014-04-20 DIAGNOSIS — J449 Chronic obstructive pulmonary disease, unspecified: Secondary | ICD-10-CM | POA: Insufficient documentation

## 2014-04-20 DIAGNOSIS — R599 Enlarged lymph nodes, unspecified: Secondary | ICD-10-CM | POA: Insufficient documentation

## 2014-04-20 DIAGNOSIS — C3492 Malignant neoplasm of unspecified part of left bronchus or lung: Secondary | ICD-10-CM | POA: Diagnosis not present

## 2014-04-20 DIAGNOSIS — E039 Hypothyroidism, unspecified: Secondary | ICD-10-CM | POA: Insufficient documentation

## 2014-04-20 DIAGNOSIS — Z882 Allergy status to sulfonamides status: Secondary | ICD-10-CM | POA: Insufficient documentation

## 2014-04-20 DIAGNOSIS — Z9889 Other specified postprocedural states: Secondary | ICD-10-CM

## 2014-04-20 DIAGNOSIS — Z7901 Long term (current) use of anticoagulants: Secondary | ICD-10-CM | POA: Insufficient documentation

## 2014-04-20 DIAGNOSIS — I739 Peripheral vascular disease, unspecified: Secondary | ICD-10-CM | POA: Insufficient documentation

## 2014-04-20 DIAGNOSIS — E041 Nontoxic single thyroid nodule: Secondary | ICD-10-CM | POA: Insufficient documentation

## 2014-04-20 HISTORY — PX: VIDEO BRONCHOSCOPY WITH ENDOBRONCHIAL NAVIGATION: SHX6175

## 2014-04-20 HISTORY — PX: VIDEO BRONCHOSCOPY WITH ENDOBRONCHIAL ULTRASOUND: SHX6177

## 2014-04-20 LAB — PROTIME-INR
INR: 1.35 (ref 0.00–1.49)
Prothrombin Time: 16.8 seconds — ABNORMAL HIGH (ref 11.6–15.2)

## 2014-04-20 LAB — APTT: aPTT: 36 seconds (ref 24–37)

## 2014-04-20 SURGERY — VIDEO BRONCHOSCOPY WITH ENDOBRONCHIAL NAVIGATION
Anesthesia: General

## 2014-04-20 MED ORDER — DEXTROSE 5 % IV SOLN
10.0000 mg | INTRAVENOUS | Status: DC | PRN
  Administered 2014-04-20: 10 ug/min via INTRAVENOUS

## 2014-04-20 MED ORDER — MIDAZOLAM HCL 5 MG/5ML IJ SOLN
INTRAMUSCULAR | Status: DC | PRN
  Administered 2014-04-20: 1 mg via INTRAVENOUS

## 2014-04-20 MED ORDER — SUCCINYLCHOLINE CHLORIDE 20 MG/ML IJ SOLN
INTRAMUSCULAR | Status: AC
  Filled 2014-04-20: qty 1

## 2014-04-20 MED ORDER — PROPOFOL 10 MG/ML IV BOLUS
INTRAVENOUS | Status: AC
  Filled 2014-04-20: qty 20

## 2014-04-20 MED ORDER — LIDOCAINE HCL (CARDIAC) 20 MG/ML IV SOLN
INTRAVENOUS | Status: DC | PRN
  Administered 2014-04-20: 60 mg via INTRAVENOUS

## 2014-04-20 MED ORDER — FENTANYL CITRATE 0.05 MG/ML IJ SOLN
INTRAMUSCULAR | Status: AC
  Filled 2014-04-20: qty 5

## 2014-04-20 MED ORDER — MIDAZOLAM HCL 2 MG/2ML IJ SOLN
INTRAMUSCULAR | Status: AC
Start: 2014-04-20 — End: 2014-04-20
  Filled 2014-04-20: qty 2

## 2014-04-20 MED ORDER — FENTANYL CITRATE 0.05 MG/ML IJ SOLN
INTRAMUSCULAR | Status: DC | PRN
  Administered 2014-04-20: 50 ug via INTRAVENOUS

## 2014-04-20 MED ORDER — GLYCOPYRROLATE 0.2 MG/ML IJ SOLN
INTRAMUSCULAR | Status: DC | PRN
  Administered 2014-04-20: 0.6 mg via INTRAVENOUS

## 2014-04-20 MED ORDER — PROPOFOL 10 MG/ML IV BOLUS
INTRAVENOUS | Status: DC | PRN
  Administered 2014-04-20: 80 mg via INTRAVENOUS

## 2014-04-20 MED ORDER — NEOSTIGMINE METHYLSULFATE 10 MG/10ML IV SOLN
INTRAVENOUS | Status: DC | PRN
  Administered 2014-04-20: 4 mg via INTRAVENOUS

## 2014-04-20 MED ORDER — ROCURONIUM BROMIDE 100 MG/10ML IV SOLN
INTRAVENOUS | Status: DC | PRN
  Administered 2014-04-20: 10 mg via INTRAVENOUS
  Administered 2014-04-20: 40 mg via INTRAVENOUS

## 2014-04-20 MED ORDER — LIDOCAINE HCL (CARDIAC) 20 MG/ML IV SOLN
INTRAVENOUS | Status: AC
  Filled 2014-04-20: qty 5

## 2014-04-20 MED ORDER — 0.9 % SODIUM CHLORIDE (POUR BTL) OPTIME
TOPICAL | Status: DC | PRN
  Administered 2014-04-20: 1000 mL

## 2014-04-20 MED ORDER — LACTATED RINGERS IV SOLN
INTRAVENOUS | Status: DC | PRN
  Administered 2014-04-20: 08:00:00 via INTRAVENOUS

## 2014-04-20 MED ORDER — ROCURONIUM BROMIDE 50 MG/5ML IV SOLN
INTRAVENOUS | Status: AC
  Filled 2014-04-20: qty 1

## 2014-04-20 MED ORDER — ONDANSETRON HCL 4 MG/2ML IJ SOLN
INTRAMUSCULAR | Status: DC | PRN
  Administered 2014-04-20: 4 mg via INTRAVENOUS

## 2014-04-20 MED ORDER — EPINEPHRINE HCL 1 MG/ML IJ SOLN
INTRAMUSCULAR | Status: AC
  Filled 2014-04-20: qty 1

## 2014-04-20 MED ORDER — FENTANYL CITRATE 0.05 MG/ML IJ SOLN
25.0000 ug | INTRAMUSCULAR | Status: DC | PRN
Start: 2014-04-20 — End: 2014-04-20

## 2014-04-20 MED ORDER — ARTIFICIAL TEARS OP OINT
TOPICAL_OINTMENT | OPHTHALMIC | Status: DC | PRN
  Administered 2014-04-20: 1 via OPHTHALMIC

## 2014-04-20 SURGICAL SUPPLY — 43 items
BLADE 10 SAFETY STRL DISP (BLADE) ×3 IMPLANT
BRUSH CYTOL CELLEBRITY 1.5X140 (MISCELLANEOUS) ×6 IMPLANT
BRUSH SUPERTRAX BIOPSY (INSTRUMENTS) ×3 IMPLANT
BRUSH SUPERTRAX NDL-TIP CYTO (INSTRUMENTS) IMPLANT
CANISTER SUCTION 2500CC (MISCELLANEOUS) ×3 IMPLANT
CHANNEL WORK EXTEND EDGE 180 (KITS) IMPLANT
CHANNEL WORK EXTEND EDGE 45 (KITS) IMPLANT
CHANNEL WORK EXTEND EDGE 90 (KITS) IMPLANT
CONT SPEC 4OZ CLIKSEAL STRL BL (MISCELLANEOUS) ×6 IMPLANT
COVER TABLE BACK 60X90 (DRAPES) ×3 IMPLANT
FILTER STRAW FLUID ASPIR (MISCELLANEOUS) IMPLANT
FORCEPS BIOP RJ4 1.8 (CUTTING FORCEPS) IMPLANT
FORCEPS BIOP SUPERTRX PREMAR (INSTRUMENTS) IMPLANT
GAUZE SPONGE 4X4 12PLY STRL (GAUZE/BANDAGES/DRESSINGS) ×3 IMPLANT
GLOVE BIO SURGEON STRL SZ 6.5 (GLOVE) ×2 IMPLANT
GLOVE BIO SURGEON STRL SZ8 (GLOVE) ×3 IMPLANT
GLOVE BIO SURGEONS STRL SZ 6.5 (GLOVE) ×1
GLOVE BIOGEL M STRL SZ7.5 (GLOVE) ×6 IMPLANT
GOWN STRL REUS W/ TWL LRG LVL3 (GOWN DISPOSABLE) ×2 IMPLANT
GOWN STRL REUS W/TWL LRG LVL3 (GOWN DISPOSABLE) ×4
KIT CLEAN ENDO COMPLIANCE (KITS) ×6 IMPLANT
KIT LOCATABLE GUIDE (CANNULA) IMPLANT
KIT MARKER FIDUCIAL DELIVERY (KITS) IMPLANT
KIT PROCEDURE EDGE 180 (KITS) IMPLANT
KIT PROCEDURE EDGE 45 (KITS) IMPLANT
KIT PROCEDURE EDGE 90 (KITS) ×3 IMPLANT
KIT ROOM TURNOVER OR (KITS) ×3 IMPLANT
MARKER SKIN DUAL TIP RULER LAB (MISCELLANEOUS) ×3 IMPLANT
NEEDLE BIOPSY TRANSBRONCH 21G (NEEDLE) IMPLANT
NEEDLE SUPERTRX PREMARK BIOPSY (NEEDLE) ×3 IMPLANT
NEEDLE SYS SONOTIP II EBUSTBNA (NEEDLE) ×3 IMPLANT
NS IRRIG 1000ML POUR BTL (IV SOLUTION) ×3 IMPLANT
OIL SILICONE PENTAX (PARTS (SERVICE/REPAIRS)) ×3 IMPLANT
PAD ARMBOARD 7.5X6 YLW CONV (MISCELLANEOUS) ×6 IMPLANT
PATCHES PATIENT (LABEL) ×3 IMPLANT
SYR 20CC LL (SYRINGE) ×3 IMPLANT
SYR 20ML ECCENTRIC (SYRINGE) ×6 IMPLANT
SYR 50ML SLIP (SYRINGE) ×3 IMPLANT
SYR 5ML LUER SLIP (SYRINGE) ×3 IMPLANT
TOWEL OR 17X24 6PK STRL BLUE (TOWEL DISPOSABLE) ×3 IMPLANT
TRAP SPECIMEN MUCOUS 40CC (MISCELLANEOUS) IMPLANT
TUBE CONNECTING 12'X1/4 (SUCTIONS) ×2
TUBE CONNECTING 12X1/4 (SUCTIONS) ×4 IMPLANT

## 2014-04-20 NOTE — Anesthesia Postprocedure Evaluation (Signed)
  Anesthesia Post-op Note  Patient: Brittney Thomas  Procedure(s) Performed: Procedure(s): VIDEO BRONCHOSCOPY WITH ENDOBRONCHIAL NAVIGATION (N/A) VIDEO BRONCHOSCOPY WITH ENDOBRONCHIAL ULTRASOUND (N/A)  Patient Location: PACU  Anesthesia Type:General  Level of Consciousness: awake and alert   Airway and Oxygen Therapy: Patient Spontanous Breathing  Post-op Pain: none  Post-op Assessment: Post-op Vital signs reviewed, Patient's Cardiovascular Status Stable and Respiratory Function Stable  Post-op Vital Signs: Reviewed  Filed Vitals:   04/20/14 1315  BP: 112/52  Pulse: 98  Temp:   Resp: 35    Complications: No apparent anesthesia complications

## 2014-04-20 NOTE — OR Nursing (Signed)
OR Nursing Note: Percepta RNA brushing sample was taken by rep to be sent out for genetic testing. Results will be reported back to MD usually in about 7 days.

## 2014-04-20 NOTE — Progress Notes (Signed)
Spoke with dr bynum regarding pts sats sats are 92-95 on r/a will occasionally drop to 82-85 comes back to mid 90,s after deep breaths/cough pt using I/S with good results dt bynum willcome by to evaluate pt

## 2014-04-20 NOTE — Progress Notes (Signed)
While RN was stepped away from bedside for chest xray, patient was noted to have decreased 02 saturations to 82.  Chin lift immediately preformed, no respiratory effort.  Anesthesia emergently paged, CRNA to bedside immediately.  Oral airway inserted and patient vigorously stimulated.  Respiratory effort remains very shallow with poor effort.  Dr. Ola Spurr to bedside.  Ordered to place on bi-pap.  Patient continuously stimulated to breathe.  Dr. Lake Bells at bedside.  Patient placed on ETCO2 monitoring.

## 2014-04-20 NOTE — Transfer of Care (Signed)
Immediate Anesthesia Transfer of Care Note  Patient: Brittney Thomas  Procedure(s) Performed: Procedure(s): VIDEO BRONCHOSCOPY WITH ENDOBRONCHIAL NAVIGATION (N/A) VIDEO BRONCHOSCOPY WITH ENDOBRONCHIAL ULTRASOUND (N/A)  Patient Location: PACU  Anesthesia Type:General  Level of Consciousness: awake, alert  and sedated  Airway & Oxygen Therapy: Patient connected to face mask oxygen  Post-op Assessment: Report given to PACU RN  Post vital signs: stable  Complications: No apparent anesthesia complications

## 2014-04-20 NOTE — Telephone Encounter (Signed)
A  We need to try to get the records of the thyroid ultrasound she had last week  Thanks  B   ------------------------  Ask pt where thyroid u/s was done when speaking to pt, so I know where to request records.  Thank you.

## 2014-04-20 NOTE — Progress Notes (Signed)
Received report from sharon rn as caregiver

## 2014-04-20 NOTE — Telephone Encounter (Signed)
lmtcb X1 on mother/daughter's number (same and only #) to schedule rov and make pt aware of what's going on.

## 2014-04-20 NOTE — Op Note (Signed)
Video Bronchoscopy with Endobronchial Ultrasound and Electronavigational Bronchoscopy Procedure Note  Date of Operation: 04/20/2014  Pre-op Diagnosis: Lung mass left upper lobe  Post-op Diagnosis: Non-small cell lung cancer  Surgeon: Simonne Maffucci  Assistants: Londell Moh  Anesthesia: General endotracheal anesthesia  Operation: Flexible video fiberoptic bronchoscopy with endobronchial ultrasound and biopsies.  Estimated Blood Loss: less than 50   Complications: None immediate  Indications and History: Brittney Thomas is a 80 y.o. female with a left upper lobe mass and mediastinal lymphadenopathy.  She is a life long nonsmoker seen in pulmonary clinic where it was recommended she have a bronchoscopy for further evaluation of the lesion.  The risks, benefits, complications, treatment options and expected outcomes were discussed with the patient.  The possibilities of pneumothorax, pneumonia, reaction to medication, pulmonary aspiration, perforation of a viscus, bleeding, failure to diagnose a condition and creating a complication requiring transfusion or operation were discussed with the patient who freely signed the consent.    Description of Procedure: The patient was examined in the preoperative area and history and data from the preprocedure consultation were reviewed. It was deemed appropriate to proceed.  The patient was taken to OR10 [facility] Pontes), identified as Brittney Thomas and the procedure verified as Flexible Video Fiberoptic Bronchoscopy.  A Time Out was held and the above information confirmed. After being taken to the operating room general anesthesia was initiated and the patient  was orally intubated. The video fiberoptic bronchoscope was introduced via the endotracheal tube and a general inspection was performed which showed only showed minor narrowing of the left upper lobe airways.  At this point 2 brushings of the right mainstem bronchus was performed for Percepta Gene  Profiling. The standard scope was then withdrawn and the endobronchial ultrasound was used to identify and characterize the peritracheal, hilar and bronchial lymph nodes. Inspection showed lymphadenopathy at 4L and 4R, 10R and 7. Using real-time ultrasound guidance Wang needle biopsies were take from Station 4L and 4R nodes and were sent for cytology.   Prior to the date of the procedure a high-resolution CT scan of the chest was performed. Utilizing [place] a virtual tracheobronchial tree was generated to allow the creation of distinct navigation pathways to the patient's parenchymal abnormalities.The extendable working channel and locator guide were introduced into the bronchoscope. The distinct navigation pathways prepared prior to this procedure were then utilized to navigate to within 0.9cm of patient's lesion(s) identified on CT scan. The extendable working channel was secured into place and the locator guide was withdrawn. Under fluoroscopic guidance transbronchial needle brushings, transbronchial Wang needle biopsies, and transbronchial forceps biopsies were performed to be sent for cytology and pathology. A bronchioalveolar lavage was performed in the left upper lobe and sent for cytology. At the end of the procedure a general airway inspection was performed and there was no evidence of active bleeding. The bronchoscope was removed.  The patient tolerated the procedure well. There was no significant blood loss and there were no obvious complications. A post-procedural chest x-ray is pending.  Samples: 1. Transbronchial Wang needle biopsy from left upper lobe 2. Transbronchial forceps biopsies from left upper lobe 3. Bronchoalveolar lavage from left upper lobe 4. Wang needle biopsies from 4L node 5. Wang needle biopsies from 4R node 6. Endobronchial brushing from the right mainstem bronchus   Plans:  The patient will be discharged from the PACU to home when recovered from  anesthesia. We will review the cytology, pathology and microbiology results with the  patient when they become available. Outpatient followup will be with Dr. Lake Bells in 1 week.    Roselie Awkward, MD [facility] Pager: [phone] Cell: [phone] If no response, call [phone]  @today @

## 2014-04-20 NOTE — H&P (View-Only) (Signed)
Subjective:    Patient ID: Brittney Thomas, female    DOB: 1933/01/08, 80 y.o.   MRN: [id number]  HPI Chief Complaint  Patient presents with  . Advice Only    Referred by Dr. Delice Lesch for pulm nodules.      Ms. Merendino tells me that she fell in mid August and had a severe bruise and hematoma in her left leg.  Since then the swelling has improved but she has developed thigh pain and tenderness in the left leg. She hsa been on warfarin since 2008 when she had a DVT/PE and Afib.  Recently her warfarin was held briefly and then restarted with the hematoma in her leg.  She was off of warfarin for about 10-14 days and has been back on that medicine by 03/01/2014.  Around this time she began to become worried about having clots in her lungs and so she requested evaluation.  She had a CT angiogram which showed a mass so a PET CT was ordered which showed likely metastatic disease.    She tells me that in the last year she sometimes feels that she can't take a deep breath, and she has mild dyspnea on exertoin.  She can walk without having to stop but she has to pace herself.  Before the fall she was participating in aerobics three times a week in the senior center.    She was diagnosed with COPD probably 4-5 years ago when she was admitted for a respiratory problem.  No problems with cough or bronchitis recently.    She lost about 5 pounds when she injured herself and this has been stable.   Past Medical History  Diagnosis Date  . Ulcer 06-08-13    Left medial ankle  . Hypertension   . Thyroid disease     Hypo-Thyroidism  . Esophageal reflux   . COPD (chronic obstructive pulmonary disease)   . Peripheral vascular disease      Family History  Problem Relation Age of Onset  . Heart disease Maternal Grandmother   . Cancer Brother     kidney  . Stroke Brother      History   Social History  . Marital Status: Widowed    Spouse Name: N/A    Number of Children: N/A  . Years of Education: N/A    Occupational History  . Not on file.   Social History Main Topics  . Smoking status: Never Smoker   . Smokeless tobacco: Never Used  . Alcohol Use: No  . Drug Use: No  . Sexual Activity: Not on file   Other Topics Concern  . Not on file   Social History Narrative  . No narrative on file     Allergies  Allergen Reactions  . Poison Ivy Extract [employer] Poison Ivy]   . Sulfa Antibiotics   . Sulfacetamide Sodium      Outpatient Prescriptions Prior to Visit  Medication Sig Dispense Refill  . acetaZOLAMIDE (DIAMOX) 250 MG tablet Take 250 mg by mouth 3 (three) times daily.      . calcium-vitamin D (OSCAL WITH D) 500-200 MG-UNIT per tablet Take 1 tablet by mouth.      . carvedilol (COREG) 6.25 MG tablet Take 6.25 mg by mouth 2 (two) times daily with a meal.      . digoxin (LANOXIN) 0.25 MG tablet Take 0.25 mg by mouth daily.      . furosemide (LASIX) 40 MG tablet Take 40 mg by mouth.      [name]  levothyroxine (SYNTHROID, LEVOTHROID) 88 MCG tablet Take 88 mcg by mouth daily before breakfast.      . lisinopril (PRINIVIL,ZESTRIL) 10 MG tablet Take 10 mg by mouth daily.      [name] LORazepam (ATIVAN) 1 MG tablet Take 1 mg by mouth every 8 (eight) hours as needed.       . Omega-3 Fatty Acids (SALMON OIL-1000 PO) Take by mouth.      . theophylline (THEO-24) 200 MG 24 hr capsule Take 200 mg by mouth daily.      [name] warfarin (COUMADIN) 2 MG tablet Take 2 mg by mouth daily.      [name] amoxicillin (AMOXIL) 875 MG tablet        No facility-administered medications prior to visit.      Review of Systems  Constitutional: Negative for fever and unexpected weight change.  HENT: Positive for postnasal drip. Negative for congestion, dental problem, ear pain, nosebleeds, rhinorrhea, sinus pressure, sneezing, sore throat and trouble swallowing.   Eyes: Negative for redness and itching.  Respiratory: Positive for shortness of breath. Negative for cough, chest tightness and wheezing.   Cardiovascular:  Negative for palpitations and leg swelling.  Gastrointestinal: Negative for nausea and vomiting.  Genitourinary: Negative for dysuria.  Musculoskeletal: Negative for joint swelling.  Skin: Negative for rash.  Neurological: Negative for headaches.  Hematological: Does not bruise/bleed easily.  Psychiatric/Behavioral: Negative for dysphoric mood. The patient is not nervous/anxious.        Objective:   Physical Exam Filed Vitals:   04/15/14 0941  BP: 144/82  Pulse: 116  Height: 5\' 5"  (1.651 m)  Weight: 163 lb (73.936 kg)  SpO2: 94%   RA  Gen: chronically ill appearing, no acute distress HEENT: NCAT, PERRL, EOMi, OP clear, thyroid nodule palpable left side PULM: CTA B CV: RRR, no mgr, no JVD AB: BS+, soft, nontender, no hsm Ext: left leg swelling and bruising around knee, palpable tenderness left medial thigh Derm: no rash or skin breakdown Neuro: A&Ox4, CN II-XII intact, strength 5/5 in all 4 extremities  03/18/2014 PET CT images reviewed by me> extensive hypermetabolic mediastinal lymphadenopathy, left upper lobe lung mass, vertebral body lesion     Assessment & Plan:   Superficial thrombophlebitis Today we sent her for a left leg ultrasound because of her leg pain and swelling and she was found to have a large SUPERFICIAL left leg clot.  I am concerned that with her malignancy she may be hypercoagulable as this occurred in the setting of her INR of 3.5.   I am uncomfortable bridging her off of lovenox and I think in the setting of her malignancy she likely needs to be anticoagulated with lovenox anyway.  Plan: -refer to coumadin clnic, bridge indefinitely with lovenox -follow up with Heme-Onc for further instructions after we have a tissue diagnosis for her cancer  Lung mass I have reviewed the PET CT images with the patient and her daughter in clinic.  This finding is very worrisome for malignancy.  In the setting of never smoking, I am concerned about the possibility of  adenocarcinoma.  Normally I would have the vertebral body biopsied, but these specimens cannot undergo genetic testing due to sample processing issues.  So we need to obtain tissue from her chest.  Plan: -EBUS and ENB planned for next week at [facility] -bridge with lovenox as noted above  Thyroid nodule This was worrisome in appearance on the PET CT, but she apparently underwent a thyroid ultrasound last week and  was told that she didn't need a biopsy.  We will try to get records of this from her PCP.    Updated Medication List Outpatient Encounter Prescriptions as of 04/15/2014  Medication Sig  . acetaZOLAMIDE (DIAMOX) 250 MG tablet Take 250 mg by mouth 3 (three) times daily.  . calcium-vitamin D (OSCAL WITH D) 500-200 MG-UNIT per tablet Take 1 tablet by mouth.  . carvedilol (COREG) 6.25 MG tablet Take 6.25 mg by mouth 2 (two) times daily with a meal.  . digoxin (LANOXIN) 0.25 MG tablet Take 0.25 mg by mouth daily.  . furosemide (LASIX) 40 MG tablet Take 40 mg by mouth.  . levothyroxine (SYNTHROID, LEVOTHROID) 88 MCG tablet Take 88 mcg by mouth daily before breakfast.  . lisinopril (PRINIVIL,ZESTRIL) 10 MG tablet Take 10 mg by mouth daily.  [name] LORazepam (ATIVAN) 1 MG tablet Take 1 mg by mouth every 8 (eight) hours as needed.   . Omega-3 Fatty Acids (SALMON OIL-1000 PO) Take by mouth.  . theophylline (THEO-24) 200 MG 24 hr capsule Take 200 mg by mouth daily.  [name] warfarin (COUMADIN) 2 MG tablet Take 2 mg by mouth daily.  . [DISCONTINUED] amoxicillin (AMOXIL) 875 MG tablet

## 2014-04-20 NOTE — Telephone Encounter (Signed)
Per BQ- Pt will need thoracic oncology referral for dx of lung cancer with Dr. Julien Nordmann. Pt's daughter, Peggye Fothergill,  is aware of the diagnosis.   Order placed.  Called and left message for daughter to make aware of the referral that has been placed. F/u will need to be scheduled for BQ or TP in 1 week. Per BQ ok to overbook.   Will forward to [place] to make aware.

## 2014-04-20 NOTE — Telephone Encounter (Signed)
Called and spoke to Brittney Thomas. Brittney Thomas stated nothing further is needed now. Pt was prepped this morning for surgery.

## 2014-04-20 NOTE — Progress Notes (Signed)
Dr Lake Bells here to see pt pt awake orioented bi-pap d/c per dr Lake Bells placed on o2 n/c at 2l will continue to [facility]

## 2014-04-20 NOTE — Interval H&P Note (Signed)
PCCM INterval Note  Pt presnets for FOB under general anesthesia. All questions answered. She understands the risks and benefits.   Filed Vitals:   04/20/14 0713  BP: 138/79  Pulse: 78  Temp: 97.1 F (36.2 C)  TempSrc: Oral  Resp: 16  Weight: 72.893 kg (160 lb 11.2 oz)  SpO2: 93%    Recent Labs Lab 04/15/14 1057 04/19/14 1224  HGB 14.0 13.7  HCT 43.9 43.4  WBC 5.3 5.1  PLT 206.0 205    Recent Labs Lab 04/19/14 1224  NA 140  K 4.2  CL 100  CO2 31  GLUCOSE 101*  BUN 17  CREATININE 0.85  CALCIUM 9.3    Recent Labs Lab 04/15/14 1057 04/15/14 1557 04/20/14 0655  INR 3.4* 3.5 1.35   Plan >> Will proceed with FOB + EBUS, ENB and bx's  Baltazar Apo, MD, PhD 04/20/2014, 8:29 AM [facility] [phone] or if no answer [phone]

## 2014-04-20 NOTE — Anesthesia Preprocedure Evaluation (Addendum)
Anesthesia Evaluation  Patient identified by MRN, date of birth, ID band Patient awake    Reviewed: Allergy & Precautions, H&P , NPO status , Patient's Chart, lab work & pertinent test results, reviewed documented beta blocker date and time   Airway Mallampati: II TM Distance: >3 FB Neck ROM: Full    Dental no notable dental hx. (+) Edentulous Upper, Partial Lower, Dental Advisory Given   Pulmonary COPD COPD inhaler,  breath sounds clear to auscultation  Pulmonary exam normal       Cardiovascular hypertension, Pt. on medications and Pt. on home beta blockers + Peripheral Vascular Disease + dysrhythmias Atrial Fibrillation Rhythm:Irregular Rate:Normal     Neuro/Psych negative neurological ROS  negative psych ROS   GI/Hepatic negative GI ROS, Neg liver ROS,   Endo/Other  Hypothyroidism   Renal/GU negative Renal ROS  negative genitourinary   Musculoskeletal  (+) Arthritis -,   Abdominal (+) + obese,  Abdomen: soft. Bowel sounds: normal.  Peds  Hematology negative hematology ROS (+)   Anesthesia Other Findings   Reproductive/Obstetrics negative OB ROS                       Anesthesia Physical Anesthesia Plan  ASA: III  Anesthesia Plan: General   Post-op Pain Management:    Induction: Intravenous  Airway Management Planned: Oral ETT  Additional Equipment:   Intra-op Plan:   Post-operative Plan: Extubation in OR  Informed Consent: I have reviewed the patients History and Physical, chart, labs and discussed the procedure including the risks, benefits and alternatives for the proposed anesthesia with the patient or authorized representative who has indicated his/her understanding and acceptance.   Dental advisory given  Plan Discussed with: CRNA  Anesthesia Plan Comments:         Anesthesia Quick Evaluation

## 2014-04-20 NOTE — Progress Notes (Signed)
Dr.Byrum and PA at bedside, monitored pt for a significant amount of time without oxygen on and pt remained 90-97% while monitored, no distress noted, pt cleared to be discharged home and to call MD office if she has any difficulties. Pt and family verbalized understanding, IS sent home with patient.

## 2014-04-20 NOTE — Discharge Instructions (Signed)
Start taking Lovenox again 04/21/2014 in the morning   Flexible Bronchoscopy, Care After Refer to this sheet in the next few weeks. These instructions provide you with information on caring for yourself after your procedure. Your health care provider may also give you more specific instructions. Your treatment has been planned according to current medical practices, but problems sometimes occur. Call your health care provider if you have any problems or questions after your procedure.  WHAT TO EXPECT AFTER THE PROCEDURE It is normal to have the following symptoms for 24-48 hours after the procedure:   Increased cough.  Low-grade fever.  Sore throat or hoarse voice.  Small streaks of blood in your thick spit (sputum) if tissue samples were taken (biopsy). HOME CARE INSTRUCTIONS   Do not eat or drink anything for 2 hours after your procedure. Your nose and throat were numbed by medicine. If you try to eat or drink before the medicine wears off, food or drink could go into your lungs or you could burn yourself. After the numbness is gone and your cough and gag reflexes have returned, you may eat soft food and drink liquids slowly.   The day after the procedure, you can go back to your normal diet.   You may resume normal activities.   Keep all follow-up visits as directed by your health care provider. It is important to keep all your appointments, especially if tissue samples were taken for testing (biopsy). SEEK IMMEDIATE MEDICAL CARE IF:   You have increasing shortness of breath.   You become light-headed or faint.   You have chest pain.   You have any new concerning symptoms.  You cough up more than a small amount of blood.  The amount of blood you cough up increases. MAKE SURE YOU:  Understand these instructions.  Will watch your condition.  Will get help right away if you are not doing well or get worse. Document Released: 01/04/2005 Document Revised: 11/01/2013  Document Reviewed: 02/19/2013 [facility] Patient Information 2015 [place], [place]. This information is not intended to replace advice given to you by your health care provider. Make sure you discuss any questions you have with your health care provider.  Call our office for any problems or questions. [phone]

## 2014-04-20 NOTE — Telephone Encounter (Signed)
Patient returning [place] call. [phone]

## 2014-04-20 NOTE — Telephone Encounter (Signed)
Spoke with pt, she is scheduled for Monday with TP at 9:00.  Nothing further needed.

## 2014-04-20 NOTE — Anesthesia Procedure Notes (Signed)
Procedure Name: Intubation Date/Time: 04/20/2014 8:45 AM Performed by: Maeola Harman Pre-anesthesia Checklist: Patient identified, Emergency Drugs available, Suction available, Patient being monitored and Timeout performed Patient Re-evaluated:Patient Re-evaluated prior to inductionOxygen Delivery Method: Circle system utilized Preoxygenation: Pre-oxygenation with 100% oxygen Intubation Type: IV induction Ventilation: Mask ventilation without difficulty Laryngoscope Size: Mac and 3 Grade View: Grade I Tube type: Oral Tube size: 8.5 mm Number of attempts: 1 Airway Equipment and Method: Stylet Placement Confirmation: positive ETCO2,  ETT inserted through vocal cords under direct vision and breath sounds checked- equal and bilateral Secured at: 21 cm Tube secured with: Tape Dental Injury: Teeth and Oropharynx as per pre-operative assessment

## 2014-04-20 NOTE — Progress Notes (Signed)
Spoke with Dr. Lamonte Sakai to ask about orders.  Reported last CXR was in August, and we were doing labs per anesthesia, and no order for consent. He stated no CXR needed, and he will fill out consent when he gets here if we just put a blank one on the chart.

## 2014-04-21 ENCOUNTER — Telehealth: Payer: Self-pay | Admitting: *Deleted

## 2014-04-21 NOTE — Telephone Encounter (Signed)
Called to set up appt with Dr. Julien Nordmann.  Left vm message to call with my name and phone number

## 2014-04-22 ENCOUNTER — Encounter (HOSPITAL_COMMUNITY): Payer: Self-pay | Admitting: Emergency Medicine

## 2014-04-25 ENCOUNTER — Ambulatory Visit: Payer: Medicare Other | Admitting: Adult Health

## 2014-04-25 ENCOUNTER — Telehealth: Payer: Self-pay | Admitting: *Deleted

## 2014-04-25 ENCOUNTER — Other Ambulatory Visit: Payer: Self-pay | Admitting: *Deleted

## 2014-04-25 DIAGNOSIS — R918 Other nonspecific abnormal finding of lung field: Secondary | ICD-10-CM

## 2014-04-25 NOTE — Telephone Encounter (Signed)
Called patient gave her an appt with Dr. Julien Nordmann on 05/03/14 at 11:30 labs and 11:45 with Dr. Julien Nordmann.  She verbalized understanding of appt time and place

## 2014-04-29 ENCOUNTER — Encounter: Payer: Self-pay | Admitting: Adult Health

## 2014-04-29 ENCOUNTER — Other Ambulatory Visit (INDEPENDENT_AMBULATORY_CARE_PROVIDER_SITE_OTHER): Payer: Medicare Other

## 2014-04-29 ENCOUNTER — Ambulatory Visit (INDEPENDENT_AMBULATORY_CARE_PROVIDER_SITE_OTHER): Payer: Medicare Other | Admitting: Adult Health

## 2014-04-29 VITALS — BP 124/64 | HR 99 | Temp 96.6°F | Ht 67.0 in | Wt 161.8 lb

## 2014-04-29 DIAGNOSIS — R918 Other nonspecific abnormal finding of lung field: Secondary | ICD-10-CM

## 2014-04-29 DIAGNOSIS — I809 Phlebitis and thrombophlebitis of unspecified site: Secondary | ICD-10-CM

## 2014-04-29 DIAGNOSIS — R300 Dysuria: Secondary | ICD-10-CM

## 2014-04-29 LAB — URINALYSIS, ROUTINE W REFLEX MICROSCOPIC
Bilirubin Urine: NEGATIVE
Ketones, ur: NEGATIVE
NITRITE: NEGATIVE
SPECIFIC GRAVITY, URINE: 1.01 (ref 1.000–1.030)
Urine Glucose: NEGATIVE
Urobilinogen, UA: 0.2 (ref 0.0–1.0)
pH: 6.5 (ref 5.0–8.0)

## 2014-04-29 NOTE — Addendum Note (Signed)
Addended by: Parke Poisson E on: 04/29/2014 04:56 PM   Modules accepted: Orders

## 2014-04-29 NOTE — Progress Notes (Signed)
Subjective:    Patient ID: Brittney Thomas, female    DOB: 11-Sep-1932, 80 y.o.   MRN: [id number]  HPI  Brittney Thomas tells me that she fell in mid August and had a severe bruise and hematoma in her left leg.  Since then the swelling has improved but she has developed thigh pain and tenderness in the left leg. She hsa been on warfarin since 2008 when she had a DVT/PE and Afib.  Recently her warfarin was held briefly and then restarted with the hematoma in her leg.  She was off of warfarin for about 10-14 days and has been back on that medicine by 03/01/2014.  Around this time she began to become worried about having clots in her lungs and so she requested evaluation.  She had a CT angiogram which showed a mass so a PET CT was ordered which showed likely metastatic disease.    She tells me that in the last year she sometimes feels that she can't take a deep breath, and she has mild dyspnea on exertoin.  She can walk without having to stop but she has to pace herself.  Before the fall she was participating in aerobics three times a week in the senior center.    She was diagnosed with COPD probably 4-5 years ago when she was admitted for a respiratory problem.  No problems with cough or bronchitis recently.   >set up for EBUS   04/29/2014 Follow up  Patient returns for follow-up after recent lung biopsy Patient had a CT angiogram which showed a mass so a PET CT was ordered which showed likely metastatic disease.   She was set up for an EBUS on 04/20/14  Pathology showed positive malignant cells consistent with non-small cell carcinoma, features favor adenocarcinoma She is a never smoker Patient has been set up with oncology for next week I reviewed the results with her and her daughter Support provided She denies any hemoptysis, chest pain, orthopnea, PND or increased leg swelling. No fever She is on twice daily, Lovenox due to a superficial DVT in the setting of known malignancy. She has seen a little  bit of pink tinged urine over the last few days. She denies any dysuria, frank hematuria or back pain.     Review of Systems Constitutional:   No  weight loss, night sweats,  Fevers, chills, fatigue, or  lassitude.  HEENT:   No headaches,  Difficulty swallowing,  Tooth/dental problems, or  Sore throat,                No sneezing, itching, ear ache, nasal congestion, post nasal drip,   CV:  No chest pain,  Orthopnea, PND,   anasarca, dizziness, palpitations, syncope.   GI  No heartburn, indigestion, abdominal pain, nausea, vomiting, diarrhea, change in bowel habits, loss of appetite, bloody stools.   Resp:  .  No chest wall deformity  Skin: no rash or lesions.  GU: no dysuria, change in color of urine, no urgency or frequency.  No flank pain, no hematuria   MS:  No joint pain or swelling.  No decreased range of motion.  No back pain.  Psych:  No change in mood or affect. No depression or anxiety.  No memory loss.         Objective:   Physical Exam GEN: A/Ox3; pleasant , NAD, frail and elderly   HEENT:  /AT,  EACs-clear, TMs-wnl, NOSE-clear, THROAT-clear, no lesions, no postnasal drip or exudate noted.  NECK:  Supple w/ fair ROM; no JVD; normal carotid impulses w/o bruits; no thyromegaly or nodules palpated; no lymphadenopathy.  RESP  Clear  P & A; w/o, wheezes/ rales/ or rhonchi.no accessory muscle use, no dullness to percussion  CARD:  RRR, no m/r/g  , 1+ peripheral edema, pulses intact, no cyanosis or clubbing. Venous insufficency changes   GI:   Soft & nt; nml bowel sounds; no organomegaly or masses detected.  Musco: Warm bil, no deformities or joint swelling noted.   Neuro: alert, no focal deficits noted.    Skin: Warm, no lesions or rashes         Assessment & Plan:

## 2014-04-29 NOTE — Assessment & Plan Note (Signed)
Patient is to continue on Lovenox injections. Considering her underlying malignancy Follow-up with heme/Onc as planned next week

## 2014-04-29 NOTE — Progress Notes (Signed)
I agree with this plan.

## 2014-04-29 NOTE — Patient Instructions (Signed)
Continue on current regimen Follow-up Dr. Spero Curb in 6  weeks and as needed Follow up with Dr. Julien Nordmann next week as planned and As needed

## 2014-04-29 NOTE — Assessment & Plan Note (Signed)
Left upper lobe lung mass with positive PET scan, status post biopsy positive for non-small cell carcinoma, features favor adenocarcinoma. Patient has been referred to oncology with an upcoming appointment on November 3 with Dr. Earlie Server Results were reviewed with patient and her daughter Support provided Patient is to follow-up with Dr. Curt Jews in 6 weeks and as needed

## 2014-05-01 LAB — URINE CULTURE: Colony Count: 70000

## 2014-05-03 ENCOUNTER — Other Ambulatory Visit (HOSPITAL_BASED_OUTPATIENT_CLINIC_OR_DEPARTMENT_OTHER): Payer: Medicare Other

## 2014-05-03 ENCOUNTER — Telehealth: Payer: Self-pay | Admitting: Internal Medicine

## 2014-05-03 ENCOUNTER — Other Ambulatory Visit: Payer: Self-pay | Admitting: Internal Medicine

## 2014-05-03 ENCOUNTER — Encounter: Payer: Self-pay | Admitting: Internal Medicine

## 2014-05-03 ENCOUNTER — Ambulatory Visit (HOSPITAL_BASED_OUTPATIENT_CLINIC_OR_DEPARTMENT_OTHER): Payer: Medicare Other | Admitting: Internal Medicine

## 2014-05-03 VITALS — BP 141/67 | HR 103 | Temp 97.8°F | Resp 16 | Ht 67.0 in | Wt 154.7 lb

## 2014-05-03 DIAGNOSIS — C3400 Malignant neoplasm of unspecified main bronchus: Secondary | ICD-10-CM

## 2014-05-03 DIAGNOSIS — R918 Other nonspecific abnormal finding of lung field: Secondary | ICD-10-CM

## 2014-05-03 DIAGNOSIS — C3412 Malignant neoplasm of upper lobe, left bronchus or lung: Secondary | ICD-10-CM

## 2014-05-03 DIAGNOSIS — C7951 Secondary malignant neoplasm of bone: Secondary | ICD-10-CM

## 2014-05-03 DIAGNOSIS — C3402 Malignant neoplasm of left main bronchus: Secondary | ICD-10-CM

## 2014-05-03 HISTORY — DX: Malignant neoplasm of unspecified main bronchus: C34.00

## 2014-05-03 LAB — CBC WITH DIFFERENTIAL/PLATELET
BASO%: 2.9 % — ABNORMAL HIGH (ref 0.0–2.0)
Basophils Absolute: 0.1 10*3/uL (ref 0.0–0.1)
EOS%: 2.8 % (ref 0.0–7.0)
Eosinophils Absolute: 0.1 10*3/uL (ref 0.0–0.5)
HCT: 40.4 % (ref 34.8–46.6)
HEMOGLOBIN: 12.6 g/dL (ref 11.6–15.9)
LYMPH#: 0.8 10*3/uL — AB (ref 0.9–3.3)
LYMPH%: 19.5 % (ref 14.0–49.7)
MCH: 31.6 pg (ref 25.1–34.0)
MCHC: 31.1 g/dL — ABNORMAL LOW (ref 31.5–36.0)
MCV: 101.5 fL — ABNORMAL HIGH (ref 79.5–101.0)
MONO#: 0.6 10*3/uL (ref 0.1–0.9)
MONO%: 14.4 % — ABNORMAL HIGH (ref 0.0–14.0)
NEUT#: 2.5 10*3/uL (ref 1.5–6.5)
NEUT%: 60.4 % (ref 38.4–76.8)
Platelets: 197 10*3/uL (ref 145–400)
RBC: 3.97 10*6/uL (ref 3.70–5.45)
RDW: 14.8 % — ABNORMAL HIGH (ref 11.2–14.5)
WBC: 4.2 10*3/uL (ref 3.9–10.3)

## 2014-05-03 LAB — COMPREHENSIVE METABOLIC PANEL (CC13)
ALT: 6 U/L (ref 0–55)
ANION GAP: 11 meq/L (ref 3–11)
AST: 14 U/L (ref 5–34)
Albumin: 3.9 g/dL (ref 3.5–5.0)
Alkaline Phosphatase: 75 U/L (ref 40–150)
BUN: 75.3 mg/dL — ABNORMAL HIGH (ref 7.0–26.0)
CHLORIDE: 106 meq/L (ref 98–109)
CO2: 30 meq/L — AB (ref 22–29)
CREATININE: 2.9 mg/dL — AB (ref 0.6–1.1)
Calcium: 9.7 mg/dL (ref 8.4–10.4)
GLUCOSE: 100 mg/dL (ref 70–140)
Potassium: 3.5 mEq/L (ref 3.5–5.1)
Sodium: 147 mEq/L — ABNORMAL HIGH (ref 136–145)
Total Bilirubin: 0.67 mg/dL (ref 0.20–1.20)
Total Protein: 7.2 g/dL (ref 6.4–8.3)

## 2014-05-03 MED ORDER — CYANOCOBALAMIN 1000 MCG/ML IJ SOLN
INTRAMUSCULAR | Status: AC
  Filled 2014-05-03: qty 1

## 2014-05-03 MED ORDER — DEXAMETHASONE 4 MG PO TABS: ORAL_TABLET | ORAL | Status: DC

## 2014-05-03 MED ORDER — FOLIC ACID 1 MG PO TABS: 1.0000 mg | ORAL_TABLET | Freq: Every day | ORAL | Status: AC

## 2014-05-03 MED ORDER — CYANOCOBALAMIN 1000 MCG/ML IJ SOLN
1000.0000 ug | Freq: Once | INTRAMUSCULAR | Status: AC
  Administered 2014-05-03: 1000 ug via INTRAMUSCULAR

## 2014-05-03 MED ORDER — PROCHLORPERAZINE MALEATE 10 MG PO TABS: 10.0000 mg | ORAL_TABLET | Freq: Four times a day (QID) | ORAL | Status: DC | PRN

## 2014-05-03 NOTE — Telephone Encounter (Signed)
gv adn printed appt sched and avs for pt for NOV adn Dec...sed added tx.

## 2014-05-03 NOTE — Progress Notes (Signed)
Quick Note:  Call patient with the result and arrange for IV fluid for worsening renal function. Encourage PO fluid intake. ______

## 2014-05-03 NOTE — Progress Notes (Signed)
[place] Telephone:([phone]) [phone]   Fax:([phone]) [phone]  CONSULT NOTE  REFERRING PHYSICIAN: Dr. Simonne Maffucci  REASON FOR CONSULTATION:  80 years old white female recently diagnosed with lung cancer.  HPI Brittney Thomas is a 80 y.o. female and never smoker with a past medical history significant for atrial fibrillation, history of deep venous thrombosis and pulmonary embolism in 2008 and has been on Coumadin since that time hypertension and history of venous insufficiency. The patient mentions that in August 2015 she fell at home and bruised the left leg. She had a hematoma and her Coumadin was discontinued. Few weeks later before resuming her Coumadin the patient requested imaging studies to rule out any new pulmonary embolism. CT angiogram of the chest was performed at [facility] and it showed no evidence for pulmonary embolism but there was left upper lobe lung nodule with questionable mediastinal lymphadenopathy. The patient was referred to Dr. Lamonte Sakai and a PET scan was performed on 03/18/2014 and it showed in the left upper lobe (image 15 of series 8) there is a 2.5 x 2.1 cm pulmonary nodule with macrolobulated margins and spiculated borders that is hypermetabolic (SUVmax = 7.3), with some associated postobstructive changes in the left apex, most compatible with a primary bronchogenic malignancy. There is extensive hypermetabolic (SUVmax = 4.0) lymphadenopathy in the left hilum which is difficult to discretely measure on today's noncontrast images. In addition, there is extensive hypermetabolic lymphadenopathy, most notably in the low left paratracheal station (SUVmax = 4.2) and in the high right paratracheal station (SUVmax = 4.9)where there is an 11 mm lymph node. There are also innumerable tiny 2-3 mm pulmonary nodules scattered throughout the lungs bilaterally which are nonspecific. The PET scan also showed Several hypermetabolic osseous lesions are noted, including a  predominantly lytic lesion in the posterior aspect of the T7 vertebral body on the right side immediately anterior to the pedicle measuring 11 x 7 mm (SUVmax = 3.8)and a mixed lytic and sclerotic lesion in the anterior aspect of the right ilium measuring approximately 10 x 8 mm (SUVmax = 4.6). Findings are compatible with T1b, N3, M1b (i.e.,stage IV) disease. There was also a 2.6 x 2.3 cm nodule in the left lobe of the thyroid gland which has some heterogeneous internal calcifications and is hypermetabolic (SUVmax = 4.6). On 04/20/2014 the patient underwent flexible video fiberoptic bronchoscopy with endobronchial ultrasound and biopsies under the care of Dr. Lake Bells. The final cytology (Accession: [id number]) of the fine-needle aspiration of the 4R lymph node showed malignant cells consistent with adenocarcinoma. There was insufficient material for molecular testing. The patient was referred to me today for further evaluation and recommendation regarding treatment of her condition. When seen today she was accompanied by her daughter [name]. The patient is anxious and continues to complain of shortness of breath at baseline and increased with exertion as well as cough productive of whitish sputum. She denied having any significant nausea or vomiting, she has lost around 5 pounds in the last few months with no significant night sweats. She has no headache or blurry vision. Family history significant for a brother who died from kidney cancer, father had parkinsonism and mother died from old age at age 28. The patient is a widow and has 2 children. She was accompanied by her daughter [name]. She used to work in [employer], manufacturing. She is a never smoker and no history of alcohol or drug abuse.  HPI  Past Medical History  Diagnosis Date  .  Ulcer 06-08-13    Left medial ankle  . Hypertension   . Thyroid disease     Hypo-Thyroidism  . Esophageal reflux   . COPD (chronic obstructive pulmonary disease)   . Peripheral  vascular disease   . Hypothyroidism   . Insomnia     takes lorazepam  . Arthritis   . Dysrhythmia   . DVT (deep venous thrombosis) 2008  . Paroxysmal a-fib 2008  . PE (pulmonary thromboembolism) 2008  . Wears dentures     Past Surgical History  Procedure Laterality Date  . Tonsillectomy    . Eye surgery Right Aug. 2014    Cataract  . Eye surgery Left Oct. 2014    Cataract  . Abdominal hysterectomy  1984    Total  . Video bronchoscopy with endobronchial navigation N/A 04/20/2014    Procedure: VIDEO BRONCHOSCOPY WITH ENDOBRONCHIAL NAVIGATION;  Surgeon: Collene Gobble, MD;  Location: [place];  Service: Thoracic;  Laterality: N/A;  . Video bronchoscopy with endobronchial ultrasound N/A 04/20/2014    Procedure: VIDEO BRONCHOSCOPY WITH ENDOBRONCHIAL ULTRASOUND;  Surgeon: Collene Gobble, MD;  Location: [facility];  Service: Thoracic;  Laterality: N/A;    Family History  Problem Relation Age of Onset  . Heart disease Maternal Grandmother   . Cancer Brother     kidney  . Stroke Brother     Social History History  Substance Use Topics  . Smoking status: Never Smoker   . Smokeless tobacco: Never Used  . Alcohol Use: No    Allergies  Allergen Reactions  . Poison Ivy Extract [employer] Poison Ivy]   . Sulfa Antibiotics Hives  . Sulfacetamide Sodium     Current Outpatient Prescriptions  Medication Sig Dispense Refill  . acetaZOLAMIDE (DIAMOX) 250 MG tablet Take 250 mg by mouth daily.     . calcium-vitamin D (OSCAL WITH D) 500-200 MG-UNIT per tablet Take 1 tablet by mouth.    . carvedilol (COREG) 6.25 MG tablet Take 6.25 mg by mouth 2 (two) times daily with a meal.    . digoxin (LANOXIN) 0.25 MG tablet Take 0.25 mg by mouth daily.    [name] enoxaparin (LOVENOX) 80 MG/0.8ML injection Inject 80 mg into the skin every 12 (twelve) hours. For 30 days    . furosemide (LASIX) 40 MG tablet Take 20 mg by mouth daily.     [name] levothyroxine (SYNTHROID, LEVOTHROID) 88 MCG tablet Take 88 mcg by  mouth daily before breakfast.    . lisinopril (PRINIVIL,ZESTRIL) 10 MG tablet Take 10 mg by mouth daily.    [name] LORazepam (ATIVAN) 1 MG tablet Take 1 mg by mouth every 8 (eight) hours as needed for anxiety.     . Omega-3 Fatty Acids (SALMON OIL-1000 PO) Take by mouth.    . theophylline (THEO-24) 200 MG 24 hr capsule Take 200 mg by mouth 2 (two) times daily.      Current Facility-Administered Medications  Medication Dose Route Frequency Provider Last Rate Last Dose  . cyanocobalamin ((VITAMIN B-12)) injection 1,000 mcg  1,000 mcg Intramuscular Once Curt Bears, MD        Review of Systems  Constitutional: positive for fatigue and weight loss Eyes: negative Ears, nose, mouth, throat, and face: negative Respiratory: positive for cough, dyspnea on exertion and sputum Cardiovascular: negative Gastrointestinal: negative Genitourinary:negative Integument/breast: negative Hematologic/lymphatic: negative Musculoskeletal:negative Neurological: negative Behavioral/Psych: negative Endocrine: negative Allergic/Immunologic: negative  Physical Exam  QMG:QQPYP, healthy, no distress, well nourished, well developed and anxious SKIN: skin color, texture,  turgor are normal, no rashes or significant lesions HEAD: Normocephalic, No masses, lesions, tenderness or abnormalities EYES: normal, PERRLA EARS: External ears normal, Canals clear OROPHARYNX:no exudate, no erythema and lips, buccal mucosa, and tongue normal  NECK: supple, no adenopathy, no JVD LYMPH:  no palpable lymphadenopathy, no hepatosplenomegaly BREAST:not examined LUNGS: clear to auscultation , and palpation HEART: regular rate & rhythm, no murmurs and no gallops ABDOMEN:abdomen soft and non-tender BACK: Back symmetric, no curvature., No CVA tenderness EXTREMITIES:no joint deformities, effusion, or inflammation, no edema, no skin discoloration  NEURO: alert & oriented x 3 with fluent speech, no focal motor/sensory  deficits  PERFORMANCE STATUS: ECOG 1  LABORATORY DATA: Lab Results  Component Value Date   WBC 4.2 05/03/2014   HGB 12.6 05/03/2014   HCT 40.4 05/03/2014   MCV 101.5* 05/03/2014   PLT 197 05/03/2014      Chemistry      Component Value Date/Time   NA 147* 05/03/2014 1146   NA 140 04/19/2014 1224   K 3.5 05/03/2014 1146   K 4.2 04/19/2014 1224   CL 100 04/19/2014 1224   CO2 30* 05/03/2014 1146   CO2 31 04/19/2014 1224   BUN 75.3* 05/03/2014 1146   BUN 17 04/19/2014 1224   CREATININE 2.9* 05/03/2014 1146   CREATININE 0.85 04/19/2014 1224      Component Value Date/Time   CALCIUM 9.7 05/03/2014 1146   CALCIUM 9.3 04/19/2014 1224   ALKPHOS 75 05/03/2014 1146   AST 14 05/03/2014 1146   ALT 6 05/03/2014 1146   BILITOT 0.67 05/03/2014 1146       RADIOGRAPHIC STUDIES: Dg Chest Port 1 View  04/20/2014   CLINICAL DATA:  Post bronchoscopy  EXAM: PORTABLE CHEST - 1 VIEW  COMPARISON:  CT chest 04/19/2014  FINDINGS: Left upper lobe mass lesion with left hilar and left mediastinal adenopathy. No pneumothorax post bronchoscopy  Hypoventilation with bibasilar atelectasis left greater than right. No significant effusion.  IMPRESSION: Left upper lobe mass and adenopathy. No pneumothorax post bronchoscopy  Hypoventilation  These results will be called to the ordering clinician or representative by the Radiologist Assistant, and communication documented in the PACS or zVision Dashboard.   Electronically Signed   By: Franchot Gallo M.D.   On: 04/20/2014 12:06   Ct Super D Chest Wo Contrast  04/19/2014   CLINICAL DATA:  Lung mass.  EXAM: CT CHEST WITHOUT CONTRAST  TECHNIQUE: Multidetector CT imaging of the chest was performed using thin slice collimation for electromagnetic bronchoscopy planning purposes, without intravenous contrast.  COMPARISON:  PET CT 03/18/2014  FINDINGS: Spiculated left upper lobe/ apical mass again noted measuring 2.4 x 1.9 cm, stable since prior study. Associated left  hilar fullness/adenopathy noted, difficult to measure without intravenous contrast. Associated AP window adenopathy and pretracheal adenopathy, stable.  Cardiomegaly. Scattered aortic calcifications without aneurysm. No pleural effusions. There are clustered nodular densities noted within the inferior right upper lobe, right middle lobe and right lower lobe, stable since prior study, likely postinflammatory.  Esophagus is air-filled, stable since prior study. Chest wall soft tissues are unremarkable. Stable left thyroid nodule/lesion.  Lytic lesion within the T7 vertebral body again noted as seen on prior PET CT. Degenerative changes and scoliosis in the thoracic spine.  IMPRESSION: Spiculated left upper lobe lesion again noted, stable. Associated left hilar and mediastinal adenopathy are stable.   Electronically Signed   By: Rolm Baptise M.D.   On: 04/19/2014 16:35   Dg C-arm Bronchoscopy  04/20/2014  CLINICAL DATA:    C-ARM BRONCHOSCOPY  Fluoroscopy was utilized by the requesting physician.  No radiographic  interpretation.     ASSESSMENT: This is a very pleasant 80 years old white female recently diagnosed with stage IV (T1b, N3, M1b) non-small cell lung cancer consistent with adenocarcinoma, presented with left upper lobe lung mass in addition to bilateral mediastinal lymphadenopathy and bone metastasis diagnosed in October 2015.    PLAN: I had a lengthy discussion with the patient and her daughter today about her current disease is stage, prognosis and treatment options. I explained to the patient that she has incurable condition and/or the treatment would be of palliative nature. I recommended for the patient to complete the staging workup by ordering MRI of the brain to rule out brain metastasis. There was insufficient material for molecular biomarker testing and in a patient with an every smoking history there is a high chance for EGFR mutation oral gene translocation. I recommended for her to  have repeat biopsy was CT-guided core biopsy of the left upper lobe lung mass by interventional radiology mainly for molecular studies. The patient agreed to this option. Her lung cancer has been diagnosed for more than 2 months now and the patient becomes symptomatic. She was given the option of palliative care and hospice referral versus proceeding with systemic chemotherapy with carboplatin and Alimta until the molecular studies are available. The patient would like to proceed with the systemic chemotherapy at this point. She will be treated with carboplatin for AUC of 5 and Alimta 500 MG/M2 every 3 weeks. I discussed with the patient adverse effect of the chemotherapy including but not limited to alopecia, myelosuppression, nausea and vomiting, peripheral neuropathy, liver or renal dysfunction. She is expected to start the first cycle of this treatment next week. [place] molecular study showed evidence for EGFR mutation oral gene translocation, we will switch her treatment to the oral target lesion. The patient would have a chemotherapy education class before starting the first dose of her treatment. She will receive vitamin B 12 injection today. I will call her pharmacy was prescription for Compazine 10 mg by mouth every 6 hours as needed for nausea, folic acid 1 mg by mouth daily in addition to Decadron 4 mg by mouth twice a day the day before, day of and day after the chemotherapy every 3 weeks. She would come back for follow-up visit in 2 weeks for reevaluation and management of any adverse effect of her treatment. I gave the patient and her daughter the time to ask questions and I answered them completely to their satisfactions. She was advised to call immediately if she has any concerning symptoms in the interval. The patient voices understanding of current disease status and treatment options and is in agreement with the current care plan.  All questions were answered. The patient knows to call the  clinic with any problems, questions or concerns. We can certainly see the patient much sooner if necessary.  Thank you so much for allowing me to participate in the care of [employer]. I will continue to follow up the patient with you and assist in her care.  I spent 55 minutes counseling the patient face to face. The total time spent in the appointment was 80 minutes.  Disclaimer: This note was dictated with voice recognition software. Similar sounding words can inadvertently be transcribed and may not be corrected upon review.   Emilea Goga K. 05/03/2014, 1:02 PM

## 2014-05-06 ENCOUNTER — Telehealth: Payer: Self-pay | Admitting: *Deleted

## 2014-05-06 ENCOUNTER — Other Ambulatory Visit: Payer: Self-pay | Admitting: *Deleted

## 2014-05-06 ENCOUNTER — Other Ambulatory Visit: Payer: Medicare Other

## 2014-05-06 ENCOUNTER — Ambulatory Visit (HOSPITAL_BASED_OUTPATIENT_CLINIC_OR_DEPARTMENT_OTHER): Payer: Medicare Other

## 2014-05-06 DIAGNOSIS — C3412 Malignant neoplasm of upper lobe, left bronchus or lung: Secondary | ICD-10-CM

## 2014-05-06 DIAGNOSIS — R944 Abnormal results of kidney function studies: Secondary | ICD-10-CM

## 2014-05-06 DIAGNOSIS — C3400 Malignant neoplasm of unspecified main bronchus: Secondary | ICD-10-CM

## 2014-05-06 MED ORDER — SODIUM CHLORIDE 0.9 % IV SOLN
1000.0000 mL | Freq: Once | INTRAVENOUS | Status: DC
  Administered 2014-05-06: 1000 mL via INTRAVENOUS

## 2014-05-06 NOTE — Patient Instructions (Signed)
Dehydration, Adult Dehydration is when you lose more fluids from the body than you take in. Vital organs like the kidneys, brain, and heart cannot function without a proper amount of fluids and salt. Any loss of fluids from the body can cause dehydration.  CAUSES   Vomiting.  Diarrhea.  Excessive sweating.  Excessive urine output.  Fever. SYMPTOMS  Mild dehydration  Thirst.  Dry lips.  Slightly dry mouth. Moderate dehydration  Very dry mouth.  Sunken eyes.  Skin does not bounce back quickly when lightly pinched and released.  Dark urine and decreased urine production.  Decreased tear production.  Headache. Severe dehydration  Very dry mouth.  Extreme thirst.  Rapid, weak pulse (more than 100 beats per minute at rest).  Cold hands and feet.  Not able to sweat in spite of heat and temperature.  Rapid breathing.  Blue lips.  Confusion and lethargy.  Difficulty being awakened.  Minimal urine production.  No tears. DIAGNOSIS  Your caregiver will diagnose dehydration based on your symptoms and your exam. Blood and urine tests will help confirm the diagnosis. The diagnostic evaluation should also identify the cause of dehydration. TREATMENT  Treatment of mild or moderate dehydration can often be done at home by increasing the amount of fluids that you drink. It is best to drink small amounts of fluid more often. Drinking too much at one time can make vomiting worse. Refer to the home care instructions below. Severe dehydration needs to be treated at the hospital where you will probably be given intravenous (IV) fluids that contain water and electrolytes. HOME CARE INSTRUCTIONS   Ask your caregiver about specific rehydration instructions.  Drink enough fluids to keep your urine clear or pale yellow.  Drink small amounts frequently if you have nausea and vomiting.  Eat as you normally do.  Avoid:  Foods or drinks high in sugar.  Carbonated  drinks.  Juice.  Extremely hot or cold fluids.  Drinks with caffeine.  Fatty, greasy foods.  Alcohol.  Tobacco.  Overeating.  Gelatin desserts.  Wash your hands well to avoid spreading bacteria and viruses.  Only take over-the-counter or prescription medicines for pain, discomfort, or fever as directed by your caregiver.  Ask your caregiver if you should continue all prescribed and over-the-counter medicines.  Keep all follow-up appointments with your caregiver. SEEK MEDICAL CARE IF:  You have abdominal pain and it increases or stays in one area (localizes).  You have a rash, stiff neck, or severe headache.  You are irritable, sleepy, or difficult to awaken.  You are weak, dizzy, or extremely thirsty. SEEK IMMEDIATE MEDICAL CARE IF:   You are unable to keep fluids down or you get worse despite treatment.  You have frequent episodes of vomiting or diarrhea.  You have blood or green matter (bile) in your vomit.  You have blood in your stool or your stool looks black and tarry.  You have not urinated in 6 to 8 hours, or you have only urinated a small amount of very dark urine.  You have a fever.  You faint. MAKE SURE YOU:   Understand these instructions.  Will watch your condition.  Will get help right away if you are not doing well or get worse. Document Released: 06/17/2005 Document Revised: 09/09/2011 Document Reviewed: 02/04/2011 Exit[employer] Patient Information 2015 [employer]. This information is not intended to replace advice given to you by your health care provider. Make sure you discuss any questions you have with your health care   provider.  

## 2014-05-06 NOTE — Telephone Encounter (Signed)
Per Dr Vista Mink, pt needs IVF and increased oral PO fluid intake due to increased Creatinine.  Called and spoke to pt, she states she is "trying to drink oral fluids at home".  Informed her that Dr Vista Mink recommends that pt get IVF, can try to arrange for IVF today after chemo education.  She states she will talk about it when she arrives to chemo education and decide then.

## 2014-05-09 ENCOUNTER — Other Ambulatory Visit: Payer: Self-pay | Admitting: Medical Oncology

## 2014-05-09 DIAGNOSIS — C3400 Malignant neoplasm of unspecified main bronchus: Secondary | ICD-10-CM

## 2014-05-10 ENCOUNTER — Other Ambulatory Visit: Payer: Self-pay | Admitting: Internal Medicine

## 2014-05-10 ENCOUNTER — Other Ambulatory Visit (HOSPITAL_BASED_OUTPATIENT_CLINIC_OR_DEPARTMENT_OTHER): Payer: Medicare Other

## 2014-05-10 ENCOUNTER — Encounter (HOSPITAL_COMMUNITY): Payer: Self-pay

## 2014-05-10 ENCOUNTER — Ambulatory Visit (HOSPITAL_BASED_OUTPATIENT_CLINIC_OR_DEPARTMENT_OTHER): Payer: Medicare Other

## 2014-05-10 DIAGNOSIS — Z5111 Encounter for antineoplastic chemotherapy: Secondary | ICD-10-CM

## 2014-05-10 DIAGNOSIS — C3400 Malignant neoplasm of unspecified main bronchus: Secondary | ICD-10-CM

## 2014-05-10 DIAGNOSIS — C3412 Malignant neoplasm of upper lobe, left bronchus or lung: Secondary | ICD-10-CM

## 2014-05-10 DIAGNOSIS — C3402 Malignant neoplasm of left main bronchus: Secondary | ICD-10-CM

## 2014-05-10 LAB — CBC WITH DIFFERENTIAL/PLATELET
BASO%: 0.3 % (ref 0.0–2.0)
Basophils Absolute: 0 10*3/uL (ref 0.0–0.1)
EOS%: 0 % (ref 0.0–7.0)
Eosinophils Absolute: 0 10*3/uL (ref 0.0–0.5)
HCT: 41.5 % (ref 34.8–46.6)
HGB: 13.2 g/dL (ref 11.6–15.9)
LYMPH%: 12.4 % — AB (ref 14.0–49.7)
MCH: 31.8 pg (ref 25.1–34.0)
MCHC: 31.9 g/dL (ref 31.5–36.0)
MCV: 99.8 fL (ref 79.5–101.0)
MONO#: 0.4 10*3/uL (ref 0.1–0.9)
MONO%: 10.3 % (ref 0.0–14.0)
NEUT#: 3.1 10*3/uL (ref 1.5–6.5)
NEUT%: 77 % — ABNORMAL HIGH (ref 38.4–76.8)
Platelets: 200 10*3/uL (ref 145–400)
RBC: 4.15 10*6/uL (ref 3.70–5.45)
RDW: 14.2 % (ref 11.2–14.5)
WBC: 4 10*3/uL (ref 3.9–10.3)
lymph#: 0.5 10*3/uL — ABNORMAL LOW (ref 0.9–3.3)

## 2014-05-10 LAB — COMPREHENSIVE METABOLIC PANEL (CC13)
ALT: 9 U/L (ref 0–55)
AST: 15 U/L (ref 5–34)
Albumin: 4.1 g/dL (ref 3.5–5.0)
Alkaline Phosphatase: 84 U/L (ref 40–150)
Anion Gap: 11 mEq/L (ref 3–11)
BILIRUBIN TOTAL: 0.56 mg/dL (ref 0.20–1.20)
BUN: 34.6 mg/dL — ABNORMAL HIGH (ref 7.0–26.0)
CO2: 31 meq/L — AB (ref 22–29)
Calcium: 9.8 mg/dL (ref 8.4–10.4)
Chloride: 100 mEq/L (ref 98–109)
Creatinine: 1.4 mg/dL — ABNORMAL HIGH (ref 0.6–1.1)
Glucose: 126 mg/dl (ref 70–140)
Potassium: 3.5 mEq/L (ref 3.5–5.1)
SODIUM: 141 meq/L (ref 136–145)
TOTAL PROTEIN: 7.7 g/dL (ref 6.4–8.3)

## 2014-05-10 MED ORDER — DEXAMETHASONE SODIUM PHOSPHATE 20 MG/5ML IJ SOLN
INTRAMUSCULAR | Status: AC
  Filled 2014-05-10: qty 5

## 2014-05-10 MED ORDER — DEXAMETHASONE SODIUM PHOSPHATE 20 MG/5ML IJ SOLN
20.0000 mg | Freq: Once | INTRAMUSCULAR | Status: AC
  Administered 2014-05-10: 20 mg via INTRAVENOUS

## 2014-05-10 MED ORDER — ONDANSETRON 16 MG/50ML IVPB (CHCC)
16.0000 mg | Freq: Once | INTRAVENOUS | Status: AC
  Administered 2014-05-10: 16 mg via INTRAVENOUS

## 2014-05-10 MED ORDER — SODIUM CHLORIDE 0.9 % IV SOLN
305.5000 mg | Freq: Once | INTRAVENOUS | Status: AC
  Administered 2014-05-10: 310 mg via INTRAVENOUS
  Filled 2014-05-10: qty 31

## 2014-05-10 MED ORDER — ONDANSETRON 16 MG/50ML IVPB (CHCC)
INTRAVENOUS | Status: AC
  Filled 2014-05-10: qty 16

## 2014-05-10 MED ORDER — SODIUM CHLORIDE 0.9 % IV SOLN
400.0000 mg/m2 | Freq: Once | INTRAVENOUS | Status: AC
  Administered 2014-05-10: 725 mg via INTRAVENOUS
  Filled 2014-05-10: qty 29

## 2014-05-10 MED ORDER — SODIUM CHLORIDE 0.9 % IV SOLN
Freq: Once | INTRAVENOUS | Status: AC
  Administered 2014-05-10: 10:00:00 via INTRAVENOUS

## 2014-05-10 NOTE — Patient Instructions (Signed)
[place] Discharge Instructions for Patients Receiving Chemotherapy  Today you received the following chemotherapy agents Alimta and Carboplatin  To help prevent nausea and vomiting after your treatment, we encourage you to take your nausea medication as prescribed.   If you develop nausea and vomiting that is not controlled by your nausea medication, call the clinic.   BELOW ARE SYMPTOMS THAT SHOULD BE REPORTED IMMEDIATELY:  *FEVER GREATER THAN 100.5 F  *CHILLS WITH OR WITHOUT FEVER  NAUSEA AND VOMITING THAT IS NOT CONTROLLED WITH YOUR NAUSEA MEDICATION  *UNUSUAL SHORTNESS OF BREATH  *UNUSUAL BRUISING OR BLEEDING  TENDERNESS IN MOUTH AND THROAT WITH OR WITHOUT PRESENCE OF ULCERS  *URINARY PROBLEMS  *BOWEL PROBLEMS  UNUSUAL RASH Items with * indicate a potential emergency and should be followed up as soon as possible.  Feel free to call the clinic you have any questions or concerns. The clinic phone number is [phone].

## 2014-05-11 ENCOUNTER — Telehealth: Payer: Self-pay | Admitting: *Deleted

## 2014-05-11 ENCOUNTER — Other Ambulatory Visit: Payer: Self-pay | Admitting: Radiology

## 2014-05-11 ENCOUNTER — Telehealth: Payer: Self-pay | Admitting: Nutrition

## 2014-05-11 NOTE — Telephone Encounter (Signed)
Contacted patient by phone to make appointment at her request.  Patient was not available.  I left message on voice mail for patient to call me.

## 2014-05-11 NOTE — Telephone Encounter (Signed)
Attempted to call pt for post chemo follow up unsuccessfully.  Unable to leave message due to phone just ringing, and no voice mail available.

## 2014-05-12 ENCOUNTER — Other Ambulatory Visit: Payer: Self-pay | Admitting: Radiology

## 2014-05-12 ENCOUNTER — Telehealth: Payer: Self-pay | Admitting: Nutrition

## 2014-05-12 NOTE — Telephone Encounter (Signed)
Patient had questions regarding her diet while on lovenox.  She would like to drink green tea occasionally and wanted to be sure it would not interfere with lovenox.  Contacted Chris with pharmacy who confirmed this should not be a problem.   Patient informed and was delighted!

## 2014-05-12 NOTE — Progress Notes (Signed)
Quick Note:  Called spoke with patient, advised of lab results / recs as stated by TP. Pt verbalized her understanding and denied any questions. Pt reports symptoms the day after appt. ______

## 2014-05-13 ENCOUNTER — Encounter (HOSPITAL_COMMUNITY): Payer: Self-pay

## 2014-05-13 ENCOUNTER — Ambulatory Visit (HOSPITAL_COMMUNITY)
Admission: RE | Admit: 2014-05-13 | Discharge: 2014-05-13 | Disposition: A | Discharge status: Home / Self Care | Payer: Medicare Other | Source: Ambulatory Visit | Attending: Internal Medicine | Admitting: Internal Medicine

## 2014-05-13 DIAGNOSIS — I10 Essential (primary) hypertension: Secondary | ICD-10-CM | POA: Diagnosis not present

## 2014-05-13 DIAGNOSIS — Z86718 Personal history of other venous thrombosis and embolism: Secondary | ICD-10-CM | POA: Diagnosis not present

## 2014-05-13 DIAGNOSIS — J449 Chronic obstructive pulmonary disease, unspecified: Secondary | ICD-10-CM | POA: Diagnosis not present

## 2014-05-13 DIAGNOSIS — J984 Other disorders of lung: Secondary | ICD-10-CM

## 2014-05-13 DIAGNOSIS — C3412 Malignant neoplasm of upper lobe, left bronchus or lung: Secondary | ICD-10-CM | POA: Insufficient documentation

## 2014-05-13 DIAGNOSIS — Z86711 Personal history of pulmonary embolism: Secondary | ICD-10-CM | POA: Insufficient documentation

## 2014-05-13 DIAGNOSIS — C3402 Malignant neoplasm of left main bronchus: Secondary | ICD-10-CM

## 2014-05-13 DIAGNOSIS — R911 Solitary pulmonary nodule: Secondary | ICD-10-CM | POA: Diagnosis present

## 2014-05-13 LAB — APTT: APTT: 29 s (ref 24–37)

## 2014-05-13 LAB — CBC
HEMATOCRIT: 40.7 % (ref 36.0–46.0)
Hemoglobin: 13.4 g/dL (ref 12.0–15.0)
MCH: 32.2 pg (ref 26.0–34.0)
MCHC: 32.9 g/dL (ref 30.0–36.0)
MCV: 97.8 fL (ref 78.0–100.0)
PLATELETS: 176 10*3/uL (ref 150–400)
RBC: 4.16 MIL/uL (ref 3.87–5.11)
RDW: 14.3 % (ref 11.5–15.5)
WBC: 5.1 10*3/uL (ref 4.0–10.5)

## 2014-05-13 LAB — PROTIME-INR
INR: 1.03 (ref 0.00–1.49)
PROTHROMBIN TIME: 13.7 s (ref 11.6–15.2)

## 2014-05-13 MED ORDER — MIDAZOLAM HCL 2 MG/2ML IJ SOLN
INTRAMUSCULAR | Status: AC | PRN
  Administered 2014-05-13: 0.5 mg via INTRAVENOUS

## 2014-05-13 MED ORDER — NALOXONE HCL 0.4 MG/ML IJ SOLN
INTRAMUSCULAR | Status: AC
  Filled 2014-05-13: qty 1

## 2014-05-13 MED ORDER — LIDOCAINE HCL 1 % IJ SOLN
INTRAMUSCULAR | Status: AC
  Filled 2014-05-13: qty 20

## 2014-05-13 MED ORDER — MIDAZOLAM HCL 2 MG/2ML IJ SOLN
INTRAMUSCULAR | Status: AC
  Filled 2014-05-13: qty 4

## 2014-05-13 MED ORDER — FENTANYL CITRATE 0.05 MG/ML IJ SOLN
INTRAMUSCULAR | Status: AC | PRN
  Administered 2014-05-13: 25 ug via INTRAVENOUS

## 2014-05-13 MED ORDER — FLUMAZENIL 0.5 MG/5ML IV SOLN
INTRAVENOUS | Status: AC
  Filled 2014-05-13: qty 5

## 2014-05-13 MED ORDER — FENTANYL CITRATE 0.05 MG/ML IJ SOLN
INTRAMUSCULAR | Status: AC
  Filled 2014-05-13: qty 4

## 2014-05-13 MED ORDER — SODIUM CHLORIDE 0.9 % IV SOLN
INTRAVENOUS | Status: DC
  Administered 2014-05-13: 09:00:00 via INTRAVENOUS

## 2014-05-13 NOTE — H&P (Signed)
Chief Complaint: "I am here for a lung biopsy."  Referring Physician(s): Mohamed,Mohamed  History of Present Illness: Brittney Thomas is a 80 y.o. female with recently diagnosed adenocarcinoma s/p bronchoscopy of LUL lung mass. IR received request for repeat LUL biopsy for additional molecular testing. She denies any chest pain, shortness of breath or palpitations. She denies any active signs of bleeding or excessive bruising. She denies any recent fever or chills. The patient denies any history of sleep apnea or chronic oxygen use. She has previously tolerated sedation without complications. She is on Lovenox for history of B/L DVT and PE and has held this medication in preparation for today. She has history of PAF and is on digoxin and denies any palpitations, lightheadedness or dizziness.     Past Medical History  Diagnosis Date  . Ulcer 06-08-13    Left medial ankle  . Hypertension   . Thyroid disease     Hypo-Thyroidism  . Esophageal reflux   . COPD (chronic obstructive pulmonary disease)   . Peripheral vascular disease   . Hypothyroidism   . Insomnia     takes lorazepam  . Arthritis   . Dysrhythmia   . DVT (deep venous thrombosis) 2008  . Paroxysmal a-fib 2008  . PE (pulmonary thromboembolism) 2008  . Wears dentures     Past Surgical History  Procedure Laterality Date  . Tonsillectomy    . Eye surgery Right Aug. 2014    Cataract  . Eye surgery Left Oct. 2014    Cataract  . Abdominal hysterectomy  1984    Total  . Video bronchoscopy with endobronchial navigation N/A 04/20/2014    Procedure: VIDEO BRONCHOSCOPY WITH ENDOBRONCHIAL NAVIGATION;  Surgeon: Collene Gobble, MD;  Location: [facility];  Service: Thoracic;  Laterality: N/A;  . Video bronchoscopy with endobronchial ultrasound N/A 04/20/2014    Procedure: VIDEO BRONCHOSCOPY WITH ENDOBRONCHIAL ULTRASOUND;  Surgeon: Collene Gobble, MD;  Location: [facility];  Service: Thoracic;  Laterality: N/A;    Allergies: Poison ivy  extract; Sulfa antibiotics; and Sulfacetamide sodium  Medications: Prior to Admission medications   Medication Sig Start Date End Date Taking? Authorizing Provider  acetaminophen (TYLENOL) 500 MG tablet Take 500 mg by mouth every 6 (six) hours as needed (pain).   Yes Historical Provider, MD  acetaZOLAMIDE (DIAMOX) 250 MG tablet Take 250 mg by mouth every evening.    Yes Historical Provider, MD  calcium-vitamin D (OSCAL WITH D) 500-200 MG-UNIT per tablet Take 1 tablet by mouth daily with breakfast.    Yes Historical Provider, MD  carvedilol (COREG) 6.25 MG tablet Take 6.25 mg by mouth 2 (two) times daily.    Yes Historical Provider, MD  digoxin (LANOXIN) 0.25 MG tablet Take 0.25 mg by mouth every evening.    Yes Historical Provider, MD  enoxaparin (LOVENOX) 80 MG/0.8ML injection Inject 80 mg into the skin every 12 (twelve) hours.  04/16/14  Yes Historical Provider, MD  folic acid (FOLVITE) 1 MG tablet Take 1 tablet (1 mg total) by mouth daily. 05/03/14  Yes Curt Bears, MD  furosemide (LASIX) 40 MG tablet Take 20 mg by mouth every morning.    Yes Historical Provider, MD  levothyroxine (SYNTHROID, LEVOTHROID) 88 MCG tablet Take 88 mcg by mouth daily before breakfast.   Yes Historical Provider, MD  lisinopril (PRINIVIL,ZESTRIL) 10 MG tablet Take 10 mg by mouth at bedtime.    Yes Historical Provider, MD  LORazepam (ATIVAN) 1 MG tablet Take 0.5 mg by mouth  at bedtime as needed for sleep.    Yes Historical Provider, MD  Omega-3 Fatty Acids (SALMON OIL-1000) 200 MG CAPS Take 1,000 mg by mouth every evening.   Yes Historical Provider, MD  prochlorperazine (COMPAZINE) 10 MG tablet Take 1 tablet (10 mg total) by mouth every 6 (six) hours as needed for nausea or vomiting. 05/03/14  Yes Curt Bears, MD  theophylline (THEODUR) 200 MG 12 hr tablet Take 200 mg by mouth 2 (two) times daily.   Yes Historical Provider, MD  dexamethasone (DECADRON) 4 MG tablet 4 mg by mouth twice a day the day before, day of  and day after the chemotherapy every 3 weeks Patient taking differently: Take 4 mg by mouth See admin instructions. 4 mg by mouth twice a day the day before, day of and day after the chemotherapy every 3 weeks 05/03/14   Curt Bears, MD  Omega-3 Fatty Acids (SALMON OIL-1000 PO) Take by mouth.    Historical Provider, MD  theophylline (THEO-24) 200 MG 24 hr capsule Take 200 mg by mouth 2 (two) times daily.     Historical Provider, MD    Family History  Problem Relation Age of Onset  . Heart disease Maternal Grandmother   . Cancer Brother     kidney  . Stroke Brother     History   Social History  . Marital Status: Widowed    Spouse Name: N/A    Number of Children: N/A  . Years of Education: N/A   Social History Main Topics  . Smoking status: Never Smoker   . Smokeless tobacco: Never Used  . Alcohol Use: No  . Drug Use: No  . Sexual Activity: No   Other Topics Concern  . None   Social History Narrative   Review of Systems: A 12 point ROS discussed and pertinent positives are indicated in the HPI above.  All other systems are negative.  Review of Systems  Vital Signs: BP 124/72 mmHg  Pulse 105  Temp(Src) 97.7 F (36.5 C) (Oral)  Resp 18  Ht 5\' 6"  (1.676 m)  Wt 154 lb (69.854 kg)  BMI 24.87 kg/m2  SpO2 96%  Physical Exam  Constitutional: She is oriented to person, place, and time. No distress.  HENT:  Head: Normocephalic and atraumatic.  Neck: No tracheal deviation present.  Cardiovascular:  Tachycardic, irregular rate with inspiration without M/G/R  Pulmonary/Chest: Effort normal and breath sounds normal. No respiratory distress. She has no wheezes. She has no rales.  Abdominal: Soft. Bowel sounds are normal. She exhibits no distension. There is no tenderness.  Neurological: She is alert and oriented to person, place, and time.  Skin: Skin is warm and dry. She is not diaphoretic.  Psychiatric: She has a normal mood and affect. Her behavior is normal. Thought  content normal.    Imaging: Dg Chest Port 1 View  04/20/2014   CLINICAL DATA:  Post bronchoscopy  EXAM: PORTABLE CHEST - 1 VIEW  COMPARISON:  CT chest 04/19/2014  FINDINGS: Left upper lobe mass lesion with left hilar and left mediastinal adenopathy. No pneumothorax post bronchoscopy  Hypoventilation with bibasilar atelectasis left greater than right. No significant effusion.  IMPRESSION: Left upper lobe mass and adenopathy. No pneumothorax post bronchoscopy  Hypoventilation  These results will be called to the ordering clinician or representative by the Radiologist Assistant, and communication documented in the PACS or zVision Dashboard.   Electronically Signed   By: Franchot Gallo M.D.   On: 04/20/2014 12:06  Ct Super D Chest Wo Contrast  04/19/2014   CLINICAL DATA:  Lung mass.  EXAM: CT CHEST WITHOUT CONTRAST  TECHNIQUE: Multidetector CT imaging of the chest was performed using thin slice collimation for electromagnetic bronchoscopy planning purposes, without intravenous contrast.  COMPARISON:  PET CT 03/18/2014  FINDINGS: Spiculated left upper lobe/ apical mass again noted measuring 2.4 x 1.9 cm, stable since prior study. Associated left hilar fullness/adenopathy noted, difficult to measure without intravenous contrast. Associated AP window adenopathy and pretracheal adenopathy, stable.  Cardiomegaly. Scattered aortic calcifications without aneurysm. No pleural effusions. There are clustered nodular densities noted within the inferior right upper lobe, right middle lobe and right lower lobe, stable since prior study, likely postinflammatory.  Esophagus is air-filled, stable since prior study. Chest wall soft tissues are unremarkable. Stable left thyroid nodule/lesion.  Lytic lesion within the T7 vertebral body again noted as seen on prior PET CT. Degenerative changes and scoliosis in the thoracic spine.  IMPRESSION: Spiculated left upper lobe lesion again noted, stable. Associated left hilar and  mediastinal adenopathy are stable.   Electronically Signed   By: Rolm Baptise M.D.   On: 04/19/2014 16:35   Dg C-arm Bronchoscopy  04/20/2014   CLINICAL DATA:    C-ARM BRONCHOSCOPY  Fluoroscopy was utilized by the requesting physician.  No radiographic  interpretation.     Labs:  CBC:  Recent Labs  04/15/14 1057 04/19/14 1224 05/03/14 1146 05/10/14 0911  WBC 5.3 5.1 4.2 4.0  HGB 14.0 13.7 12.6 13.2  HCT 43.9 43.4 40.4 41.5  PLT 206.0 205 197 200    COAGS:  Recent Labs  04/15/14 1057 04/15/14 1557 04/20/14 0655  INR 3.4* 3.5 1.35  APTT  --   --  36    BMP:  Recent Labs  04/19/14 1224 05/03/14 1146 05/10/14 0911  NA 140 147* 141  K 4.2 3.5 3.5  CL 100  --   --   CO2 31 30* 31*  GLUCOSE 101* 100 126  BUN 17 75.3* 34.6*  CALCIUM 9.3 9.7 9.8  CREATININE 0.85 2.9* 1.4*  GFRNONAA 63*  --   --   GFRAA 74*  --   --     LIVER FUNCTION TESTS:  Recent Labs  05/03/14 1146 05/10/14 0911  BILITOT 0.67 0.56  AST 14 15  ALT 6 9  ALKPHOS 75 84  PROT 7.2 7.7  ALBUMIN 3.9 4.1   Assessment and Plan: LUL lung mass s/p bronchoscopy revealed adenocarcinoma Request for repeat CT guided LUL lung mass biopsy for additional molecular testing History of DVT b/l, PE on lovenox-held yesterday and today PAF on digoxin.  Patient has been NPO, labs reviewed Risks and Benefits discussed with the patient. All of the patient's questions were answered, patient is agreeable to proceed. Consent signed and in chart.    SignedHedy Jacob 05/13/2014, 8:23 AM

## 2014-05-13 NOTE — Discharge Instructions (Signed)
Needle Biopsy of Lung, Care After °Refer to this sheet in the next few weeks. These instructions provide you with information on caring for yourself after your procedure. Your health care provider may also give you more specific instructions. Your treatment has been planned according to current medical practices, but problems sometimes occur. Call your health care provider if you have any problems or questions after your procedure. °WHAT TO EXPECT AFTER THE PROCEDURE °· A bandage will be applied over the area where the needle was inserted. You may be asked to apply pressure to the bandage for several minutes to ensure there is minimal bleeding. °· In most cases, you can leave when your needle biopsy procedure is completed. Do not drive yourself home. Someone else should take you home. °· If you received an IV sedative or general anesthetic, you will be taken to a comfortable place to relax while the medicine wears off. °· If you have upcoming travel scheduled, talk to your health care provider about when it is safe to travel by air after the procedure. °HOME CARE INSTRUCTIONS °· Expect to take it easy for the rest of the day. °· Protect the area where you received the needle biopsy by keeping the bandage in place for as long as instructed. °· You may feel some mild pain or discomfort in the area, but this should stop in a day or two. °· Take medicines only as directed by your health care provider. °SEEK MEDICAL CARE IF:  °· You have pain at the biopsy site that worsens or is not helped by medicine. °· You have swelling or drainage at the needle biopsy site. °· You have a fever. °SEEK IMMEDIATE MEDICAL CARE IF:  °· You have new or worsening shortness of breath. °· You have chest pain. °· You are coughing up blood. °· You have bleeding that does not stop with pressure or a bandage. °· You develop light-headedness or fainting. °Document Released: 04/14/2007 Document Revised: 11/01/2013 Document Reviewed:  11/09/2012 °[employer]® Patient Information ©2015 [employer]. This information is not intended to replace advice given to you by your health care provider. Make sure you discuss any questions you have with your health care provider. ° °

## 2014-05-13 NOTE — Sedation Documentation (Signed)
Patient denies pain and is resting comfortably.  

## 2014-05-13 NOTE — Sedation Documentation (Signed)
Procedure complete, pt tolerated well, a/o.

## 2014-05-13 NOTE — Procedures (Signed)
Successful LUL MASS 18G CORE BX NO COMP STABLE FULL REPORT IN PACS PATH PENDING

## 2014-05-16 ENCOUNTER — Other Ambulatory Visit: Payer: Self-pay | Admitting: Medical Oncology

## 2014-05-16 ENCOUNTER — Other Ambulatory Visit (HOSPITAL_BASED_OUTPATIENT_CLINIC_OR_DEPARTMENT_OTHER): Payer: Medicare Other

## 2014-05-16 ENCOUNTER — Ambulatory Visit (HOSPITAL_BASED_OUTPATIENT_CLINIC_OR_DEPARTMENT_OTHER): Payer: Medicare Other | Admitting: Physician Assistant

## 2014-05-16 ENCOUNTER — Telehealth: Payer: Self-pay | Admitting: Internal Medicine

## 2014-05-16 VITALS — BP 122/66 | HR 110 | Temp 97.7°F | Resp 18 | Ht 66.0 in | Wt 152.1 lb

## 2014-05-16 DIAGNOSIS — C3412 Malignant neoplasm of upper lobe, left bronchus or lung: Secondary | ICD-10-CM

## 2014-05-16 DIAGNOSIS — C3400 Malignant neoplasm of unspecified main bronchus: Secondary | ICD-10-CM

## 2014-05-16 LAB — CBC WITH DIFFERENTIAL/PLATELET
BASO%: 0.5 % (ref 0.0–2.0)
Basophils Absolute: 0 10*3/uL (ref 0.0–0.1)
EOS ABS: 0.1 10*3/uL (ref 0.0–0.5)
EOS%: 2.7 % (ref 0.0–7.0)
HCT: 39.6 % (ref 34.8–46.6)
HGB: 12.5 g/dL (ref 11.6–15.9)
LYMPH%: 29.6 % (ref 14.0–49.7)
MCH: 31.6 pg (ref 25.1–34.0)
MCHC: 31.6 g/dL (ref 31.5–36.0)
MCV: 100 fL (ref 79.5–101.0)
MONO#: 0 10*3/uL — ABNORMAL LOW (ref 0.1–0.9)
MONO%: 0.8 % (ref 0.0–14.0)
NEUT#: 2.5 10*3/uL (ref 1.5–6.5)
NEUT%: 66.4 % (ref 38.4–76.8)
Platelets: 120 10*3/uL — ABNORMAL LOW (ref 145–400)
RBC: 3.96 10*6/uL (ref 3.70–5.45)
RDW: 14.2 % (ref 11.2–14.5)
WBC: 3.7 10*3/uL — AB (ref 3.9–10.3)
lymph#: 1.1 10*3/uL (ref 0.9–3.3)

## 2014-05-16 LAB — COMPREHENSIVE METABOLIC PANEL (CC13)
ALBUMIN: 3.7 g/dL (ref 3.5–5.0)
ALT: 29 U/L (ref 0–55)
AST: 24 U/L (ref 5–34)
Alkaline Phosphatase: 78 U/L (ref 40–150)
Anion Gap: 6 mEq/L (ref 3–11)
BILIRUBIN TOTAL: 0.79 mg/dL (ref 0.20–1.20)
BUN: 31.3 mg/dL — ABNORMAL HIGH (ref 7.0–26.0)
CO2: 26 mEq/L (ref 22–29)
Calcium: 9.1 mg/dL (ref 8.4–10.4)
Chloride: 107 mEq/L (ref 98–109)
Creatinine: 1.1 mg/dL (ref 0.6–1.1)
GLUCOSE: 86 mg/dL (ref 70–140)
POTASSIUM: 4.3 meq/L (ref 3.5–5.1)
Sodium: 139 mEq/L (ref 136–145)
Total Protein: 6.7 g/dL (ref 6.4–8.3)

## 2014-05-16 NOTE — Assessment & Plan Note (Signed)
The patient is a very pleasant 80 year old Caucasian female recently diagnosed with stage IV (T1b, N3, M1 B) non-small cell lung cancer consistent with adenocarcinoma. CT biopsy on 05/13/2014 Accession number [id number] revealed invasive adenocarcinoma.she is status post 1 cycle systemic chemotherapy with carboplatin and Alimta. Overall she tolerated this first cycle relatively well with the exception of some mild fatigue. Patient was discussed with also seen by Dr. Julien Nordmann. She will continue with her weekly labs and follow-up in 2 weeks prior to the start of cycle #2.

## 2014-05-16 NOTE — Progress Notes (Signed)
No images are attached to the encounter. No scans are attached to the encounter. No scans are attached to the encounter. [place] VISIT PROGRESS NOTE  Brittney Thomas [address] [place] [address]  DIAGNOSIS: Lung cancer, main bronchus   Staging form: Lung, AJCC 7th Edition     Clinical stage from 05/03/2014: Stage IV (T1a, N3, M1b) - Unsigned  PRIOR THERAPY: None  CURRENT THERAPY: systemic chemotherapy with carboplatin for an before meals of 5 mm 500 mg/m given every 3 weeks. Status post one cycle.  INTERVAL HISTORY: Brittney Thomas 80 y.o. female returns for a scheduled regular symptom management visit for followup of her recently diagnosed stage IV (T1b, N3, M1b) non-small cell lung cancer, adenocarcinoma. She is accompanied by her daughter today. Overall she tolerated her first cycle of chemotherapy without any significant difficulty. She does report some fatigue. She denied any fever, chills, cough, shortness of breath or hemoptysis. She denied nausea, vomiting, diarrhea constipation. She denied any significant weight loss or night sweats. She had a CT guided biopsy on 05/13/2014 and presents to discuss the results.  MEDICAL HISTORY: Past Medical History  Diagnosis Date  . Ulcer 06-08-13    Left medial ankle  . Hypertension   . Thyroid disease     Hypo-Thyroidism  . Esophageal reflux   . COPD (chronic obstructive pulmonary disease)   . Peripheral vascular disease   . Hypothyroidism   . Insomnia     takes lorazepam  . Arthritis   . Dysrhythmia   . DVT (deep venous thrombosis) 2008  . Paroxysmal a-fib 2008  . PE (pulmonary thromboembolism) 2008  . Wears dentures     ALLERGIES:  is allergic to poison ivy extract; sulfa antibiotics; and sulfacetamide sodium.  MEDICATIONS:  Current Outpatient Prescriptions  Medication Sig Dispense Refill  . acetaZOLAMIDE (DIAMOX) 250 MG tablet Take 250 mg by mouth every evening.     . calcium-vitamin D (OSCAL  WITH D) 500-200 MG-UNIT per tablet Take 1 tablet by mouth daily with breakfast.     . carvedilol (COREG) 6.25 MG tablet Take 6.25 mg by mouth 2 (two) times daily.     [name] dexamethasone (DECADRON) 4 MG tablet 4 mg by mouth twice a day the day before, day of and day after the chemotherapy every 3 weeks (Patient taking differently: Take 4 mg by mouth See admin instructions. 4 mg by mouth twice a day the day before, day of and day after the chemotherapy every 3 weeks) 40 tablet 1  . digoxin (LANOXIN) 0.25 MG tablet Take 0.25 mg by mouth every evening.     . enoxaparin (LOVENOX) 80 MG/0.8ML injection Inject 80 mg into the skin every 12 (twelve) hours.     . folic acid (FOLVITE) 1 MG tablet Take 1 tablet (1 mg total) by mouth daily. 30 tablet 3  . furosemide (LASIX) 40 MG tablet Take 20 mg by mouth every morning.     [name] levothyroxine (SYNTHROID, LEVOTHROID) 88 MCG tablet Take 88 mcg by mouth daily before breakfast.    . lisinopril (PRINIVIL,ZESTRIL) 10 MG tablet Take 10 mg by mouth at bedtime.     [name] LORazepam (ATIVAN) 1 MG tablet Take 0.5 mg by mouth at bedtime as needed for sleep.     . Omega-3 Fatty Acids (SALMON OIL-1000 PO) Take by mouth.    . Omega-3 Fatty Acids (SALMON OIL-1000) 200 MG CAPS Take 1,000 mg by mouth every evening.    . theophylline (  THEODUR) 200 MG 12 hr tablet Take 200 mg by mouth 2 (two) times daily.    [name] acetaminophen (TYLENOL) 500 MG tablet Take 500 mg by mouth every 6 (six) hours as needed (pain).    . prochlorperazine (COMPAZINE) 10 MG tablet Take 1 tablet (10 mg total) by mouth every 6 (six) hours as needed for nausea or vomiting. 30 tablet 0   No current facility-administered medications for this visit.    SURGICAL HISTORY:  Past Surgical History  Procedure Laterality Date  . Tonsillectomy    . Eye surgery Right Aug. 2014    Cataract  . Eye surgery Left Oct. 2014    Cataract  . Abdominal hysterectomy  1984    Total  . Video bronchoscopy with endobronchial navigation  N/A 04/20/2014    Procedure: VIDEO BRONCHOSCOPY WITH ENDOBRONCHIAL NAVIGATION;  Surgeon: Collene Gobble, MD;  Location: [facility];  Service: Thoracic;  Laterality: N/A;  . Video bronchoscopy with endobronchial ultrasound N/A 04/20/2014    Procedure: VIDEO BRONCHOSCOPY WITH ENDOBRONCHIAL ULTRASOUND;  Surgeon: Collene Gobble, MD;  Location: [place] [facility];  Service: Thoracic;  Laterality: N/A;    REVIEW OF SYSTEMS:  Review of Systems  Constitutional: Positive for malaise/fatigue. Negative for fever, chills, weight loss and diaphoresis.  HENT: Negative for congestion, ear discharge, ear pain, hearing loss, nosebleeds, sore throat and tinnitus.   Eyes: Negative for blurred vision, double vision, photophobia, pain, discharge and redness.  Respiratory: Negative for cough, hemoptysis, sputum production, shortness of breath, wheezing and stridor.   Cardiovascular: Negative for chest pain, palpitations, orthopnea, claudication, leg swelling and PND.  Gastrointestinal: Negative for heartburn, nausea, vomiting, abdominal pain, diarrhea, constipation, blood in stool and melena.  Genitourinary: Negative.   Musculoskeletal: Negative.   Skin: Negative.   Neurological: Negative for dizziness, tingling, focal weakness, seizures, weakness and headaches.  Endo/Heme/Allergies: Does not bruise/bleed easily.  Psychiatric/Behavioral: Negative for depression. The patient is not nervous/anxious and does not have insomnia.      PHYSICAL EXAMINATION: Physical Exam  Constitutional: She is oriented to person, place, and time and well-developed, well-nourished, and in no distress.  HENT:  Head: Normocephalic and atraumatic.  Mouth/Throat: Oropharynx is clear and moist.  Eyes: Pupils are equal, round, and reactive to light.  Neck: Normal range of motion. Neck supple. No JVD present. No tracheal deviation present. No thyromegaly present.  Cardiovascular: Normal rate, regular rhythm, normal heart sounds and intact distal pulses.   Exam reveals no gallop and no friction rub.   No murmur heard. Pulmonary/Chest: Effort normal and breath sounds normal. No respiratory distress. She has no wheezes. She has no rales.  Abdominal: Soft. Bowel sounds are normal. She exhibits no distension and no mass. There is no tenderness.  Musculoskeletal: Normal range of motion. She exhibits no edema or tenderness.  Lymphadenopathy:    She has no cervical adenopathy.  Neurological: She is alert and oriented to person, place, and time. She has normal reflexes. Gait normal.  Skin: Skin is warm and dry. No rash noted.    ECOG PERFORMANCE STATUS: 1 - Symptomatic but completely ambulatory  Blood pressure 122/66, pulse 110, temperature 97.7 F (36.5 C), temperature source Oral, resp. rate 18, height 5\' 6"  (1.676 m), weight 152 lb 1.6 oz (68.992 kg), SpO2 95 %.  LABORATORY DATA: Lab Results  Component Value Date   WBC 3.7* 05/16/2014   HGB 12.5 05/16/2014   HCT 39.6 05/16/2014   MCV 100.0 05/16/2014   PLT 120* 05/16/2014  Chemistry      Component Value Date/Time   NA 139 05/16/2014 0904   NA 140 04/19/2014 1224   K 4.3 05/16/2014 0904   K 4.2 04/19/2014 1224   CL 100 04/19/2014 1224   CO2 26 05/16/2014 0904   CO2 31 04/19/2014 1224   BUN 31.3* 05/16/2014 0904   BUN 17 04/19/2014 1224   CREATININE 1.1 05/16/2014 0904   CREATININE 0.85 04/19/2014 1224      Component Value Date/Time   CALCIUM 9.1 05/16/2014 0904   CALCIUM 9.3 04/19/2014 1224   ALKPHOS 78 05/16/2014 0904   AST 24 05/16/2014 0904   ALT 29 05/16/2014 0904   BILITOT 0.79 05/16/2014 0904       RADIOGRAPHIC STUDIES:  Ct Biopsy  05/16/2014   CLINICAL DATA:  Left upper lobe PET positive nodule  EXAM: CT GUIDED CORE BIOPSY OF LEFT UPPER LOBE NODULE  ANESTHESIA/SEDATION: 0.5  Mg IV Versed; 25 mcg IV Fentanyl  Total Moderate Sedation Time: 15 minutes.  PROCEDURE: The procedure risks, benefits, and alternatives were explained to the patient. Questions regarding  the procedure were encouraged and answered. The patient understands and consents to the procedure.  The left anterior chest was prepped with Betadinein a sterile fashion, and a sterile drape was applied covering the operative field. A sterile gown and sterile gloves were used for the procedure. Local anesthesia was provided with 1% Lidocaine.  Previous imaging reviewed. Patient positioned supine. Noncontrast localization CT performed. Anterior left upper lobe spiculated nodule was localized. Under sterile conditions and local anesthesia, a 17 gauge 6.8 cm access needle was advanced from an anterior intercostal approach to the left upper lobe nodule. Needle position confirmed with CT. 3 18 gauge core biopsies obtained.  To aide in the pneumothorax prevention, the [employer] tract sealant device was utilized upon needle removal.  Postprocedure imaging demonstrates no significant hemorrhage, effusion or pneumothorax.  Complications: None immediate  FINDINGS: Imaging confirms needle placement into the left upper lobe mass for core biopsy  IMPRESSION: Successful CT-guided left upper lobe mass 18 gauge core biopsies.   Electronically Signed   By: Daryll Brod M.D.   On: 05/13/2014 10:07   Dg Chest Port 1 View  05/13/2014   CLINICAL DATA:  80 year old with left upper lobe mass  EXAM: PORTABLE CHEST - 1 VIEW  COMPARISON:  Correlation with biopsy chest CT from the same day.  FINDINGS: The cardiac silhouette is enlarged. Mediastinal contours are unchanged. Atherosclerotic aortic calcifications are present  A in the medial aspect of the left upper lobe a focal opacity/mass is noted. There is no pneumothorax status post biopsy of this mass. Linear opacities in the lung bases likely correspond to atelectasis. There is no pleural effusion. There is no overt pulmonary edema.  There is no acute osseous abnormality.  IMPRESSION: No pneumothorax status post left upper lobe mass biopsy.   Electronically Signed   By: Rosemarie Ax   On: 05/13/2014 11:26   Dg Chest Port 1 View  04/20/2014   CLINICAL DATA:  Post bronchoscopy  EXAM: PORTABLE CHEST - 1 VIEW  COMPARISON:  CT chest 04/19/2014  FINDINGS: Left upper lobe mass lesion with left hilar and left mediastinal adenopathy. No pneumothorax post bronchoscopy  Hypoventilation with bibasilar atelectasis left greater than right. No significant effusion.  IMPRESSION: Left upper lobe mass and adenopathy. No pneumothorax post bronchoscopy  Hypoventilation  These results will be called to the ordering clinician or representative by the Radiologist Assistant, and communication documented  in the PACS or zVision Dashboard.   Electronically Signed   By: Franchot Gallo M.D.   On: 04/20/2014 12:06   Ct Super D Chest Wo Contrast  04/19/2014   CLINICAL DATA:  Lung mass.  EXAM: CT CHEST WITHOUT CONTRAST  TECHNIQUE: Multidetector CT imaging of the chest was performed using thin slice collimation for electromagnetic bronchoscopy planning purposes, without intravenous contrast.  COMPARISON:  PET CT 03/18/2014  FINDINGS: Spiculated left upper lobe/ apical mass again noted measuring 2.4 x 1.9 cm, stable since prior study. Associated left hilar fullness/adenopathy noted, difficult to measure without intravenous contrast. Associated AP window adenopathy and pretracheal adenopathy, stable.  Cardiomegaly. Scattered aortic calcifications without aneurysm. No pleural effusions. There are clustered nodular densities noted within the inferior right upper lobe, right middle lobe and right lower lobe, stable since prior study, likely postinflammatory.  Esophagus is air-filled, stable since prior study. Chest wall soft tissues are unremarkable. Stable left thyroid nodule/lesion.  Lytic lesion within the T7 vertebral body again noted as seen on prior PET CT. Degenerative changes and scoliosis in the thoracic spine.  IMPRESSION: Spiculated left upper lobe lesion again noted, stable. Associated left hilar and  mediastinal adenopathy are stable.   Electronically Signed   By: Rolm Baptise M.D.   On: 04/19/2014 16:35   Dg C-arm Bronchoscopy  04/20/2014   CLINICAL DATA:    C-ARM BRONCHOSCOPY  Fluoroscopy was utilized by the requesting physician.  No radiographic  interpretation.      ASSESSMENT/PLAN:  No problem-specific assessment & plan notes found for this encounter.   Awilda Metro E, PA-C 05/16/2014     All questions were answered. The patient knows to call the clinic with any problems, questions or concerns. We can certainly see the patient much sooner if necessary.

## 2014-05-16 NOTE — Telephone Encounter (Signed)
s.w pt daughter and advised on 11.30 and 12.1 appt s....ok and aware....aware all other appts are still the same

## 2014-05-20 ENCOUNTER — Ambulatory Visit (HOSPITAL_COMMUNITY)
Admission: RE | Admit: 2014-05-20 | Discharge: 2014-05-20 | Disposition: A | Discharge status: Home / Self Care | Payer: Medicare Other | Source: Ambulatory Visit | Attending: Internal Medicine | Admitting: Internal Medicine

## 2014-05-20 DIAGNOSIS — C3490 Malignant neoplasm of unspecified part of unspecified bronchus or lung: Secondary | ICD-10-CM | POA: Insufficient documentation

## 2014-05-20 DIAGNOSIS — M479 Spondylosis, unspecified: Secondary | ICD-10-CM | POA: Insufficient documentation

## 2014-05-20 DIAGNOSIS — C3402 Malignant neoplasm of left main bronchus: Secondary | ICD-10-CM

## 2014-05-20 DIAGNOSIS — G319 Degenerative disease of nervous system, unspecified: Secondary | ICD-10-CM | POA: Diagnosis not present

## 2014-05-20 MED ORDER — GADOBENATE DIMEGLUMINE 529 MG/ML IV SOLN
15.0000 mL | Freq: Once | INTRAVENOUS | Status: AC | PRN
  Administered 2014-05-20: 14 mL via INTRAVENOUS

## 2014-05-24 ENCOUNTER — Ambulatory Visit (HOSPITAL_BASED_OUTPATIENT_CLINIC_OR_DEPARTMENT_OTHER): Payer: Medicare Other

## 2014-05-24 ENCOUNTER — Telehealth: Payer: Self-pay | Admitting: Pulmonary Disease

## 2014-05-24 ENCOUNTER — Telehealth: Payer: Self-pay | Admitting: *Deleted

## 2014-05-24 ENCOUNTER — Other Ambulatory Visit: Payer: Self-pay | Admitting: *Deleted

## 2014-05-24 ENCOUNTER — Other Ambulatory Visit (HOSPITAL_BASED_OUTPATIENT_CLINIC_OR_DEPARTMENT_OTHER): Payer: Medicare Other

## 2014-05-24 DIAGNOSIS — C3412 Malignant neoplasm of upper lobe, left bronchus or lung: Secondary | ICD-10-CM

## 2014-05-24 DIAGNOSIS — D709 Neutropenia, unspecified: Secondary | ICD-10-CM

## 2014-05-24 DIAGNOSIS — C3400 Malignant neoplasm of unspecified main bronchus: Secondary | ICD-10-CM

## 2014-05-24 DIAGNOSIS — Z5189 Encounter for other specified aftercare: Secondary | ICD-10-CM

## 2014-05-24 LAB — CBC WITH DIFFERENTIAL/PLATELET
BASO%: 0 % (ref 0.0–2.0)
Basophils Absolute: 0 10*3/uL (ref 0.0–0.1)
EOS%: 4.5 % (ref 0.0–7.0)
Eosinophils Absolute: 0.1 10*3/uL (ref 0.0–0.5)
HCT: 26.2 % — ABNORMAL LOW (ref 34.8–46.6)
HGB: 8.4 g/dL — ABNORMAL LOW (ref 11.6–15.9)
LYMPH%: 76.7 % — AB (ref 14.0–49.7)
MCH: 31.7 pg (ref 25.1–34.0)
MCHC: 32.1 g/dL (ref 31.5–36.0)
MCV: 98.9 fL (ref 79.5–101.0)
MONO#: 0.1 10*3/uL (ref 0.1–0.9)
MONO%: 6 % (ref 0.0–14.0)
NEUT#: 0.2 10*3/uL — CL (ref 1.5–6.5)
NEUT%: 12.8 % — ABNORMAL LOW (ref 38.4–76.8)
Platelets: 12 10*3/uL — ABNORMAL LOW (ref 145–400)
RBC: 2.65 10*6/uL — AB (ref 3.70–5.45)
RDW: 13.2 % (ref 11.2–14.5)
WBC: 1.3 10*3/uL — ABNORMAL LOW (ref 3.9–10.3)
lymph#: 1 10*3/uL (ref 0.9–3.3)

## 2014-05-24 LAB — COMPREHENSIVE METABOLIC PANEL (CC13)
ALBUMIN: 3.5 g/dL (ref 3.5–5.0)
ALT: 26 U/L (ref 0–55)
ANION GAP: 8 meq/L (ref 3–11)
AST: 22 U/L (ref 5–34)
Alkaline Phosphatase: 94 U/L (ref 40–150)
BILIRUBIN TOTAL: 0.4 mg/dL (ref 0.20–1.20)
BUN: 33.4 mg/dL — ABNORMAL HIGH (ref 7.0–26.0)
CO2: 27 meq/L (ref 22–29)
Calcium: 8.7 mg/dL (ref 8.4–10.4)
Chloride: 104 mEq/L (ref 98–109)
Creatinine: 1.1 mg/dL (ref 0.6–1.1)
GLUCOSE: 85 mg/dL (ref 70–140)
POTASSIUM: 3.9 meq/L (ref 3.5–5.1)
SODIUM: 139 meq/L (ref 136–145)
TOTAL PROTEIN: 6.3 g/dL — AB (ref 6.4–8.3)

## 2014-05-24 MED ORDER — TBO-FILGRASTIM 300 MCG/0.5ML ~~LOC~~ SOSY
300.0000 ug | PREFILLED_SYRINGE | Freq: Once | SUBCUTANEOUS | Status: AC
  Administered 2014-05-24: 300 ug via SUBCUTANEOUS
  Filled 2014-05-24: qty 0.5

## 2014-05-24 NOTE — Progress Notes (Signed)
Reviewed neutropenic and thrombocytic precautions with pt and daughter. They both voiced understanding. Written information provided at discharge.

## 2014-05-24 NOTE — Telephone Encounter (Signed)
Per Dr Vista Mink, ANC 0.2, WBC 1.3, granix 313mcg x 2 days and bleeding precautions for plts 12.  Called and left msg to call us back.

## 2014-05-24 NOTE — Telephone Encounter (Signed)
PT.'S DAUGHTER FEELS THE LUMPS ARE FROM HER MOTHER'S LOVENOX INJECTIONS WHICH SHE RECEIVES TWICE A DAY. VERBAL ORDER AND READ BACK TO DR.MOHAMED- PT. NEEDS TO CONTACT THE PHYSICIAN WHO ORDERED THE LOVENOX. NOTIFIED PT. AND HER DAUGHTER OF DR.MOHAMED'S INSTRUCTIONS. THEY WILL CONTACT DR.MCQUAID TODAY.

## 2014-05-24 NOTE — Telephone Encounter (Signed)
Pt's daughter is bringing pt in for injection.  Communicated with injection RN to give bleeding precautions and injection appt for tomorrow.

## 2014-05-24 NOTE — Patient Instructions (Signed)
Thrombocytopenia Thrombocytopenia is a condition in which there is an abnormally small number of platelets in your blood. Platelets are also called thrombocytes. Platelets are needed for blood clotting. CAUSES Thrombocytopenia is caused by:   Decreased production of platelets. This can be caused by:  Aplastic anemia in which your bone marrow quits making blood cells.  Cancer in the bone marrow.  Use of certain medicines, including chemotherapy.  Infection in the bone marrow.  Heavy alcohol consumption.  Increased destruction of platelets. This can be caused by:  Certain immune diseases.  Use of certain drugs.  Certain blood clotting disorders.  Certain inherited disorders.  Certain bleeding disorders.  Pregnancy.  Having an enlarged spleen (hypersplenism). In hypersplenism, the spleen gathers up platelets from circulation. This means the platelets are not available to help with blood clotting. The spleen can enlarge due to cirrhosis or other conditions. SYMPTOMS  The symptoms of thrombocytopenia are side effects of poor blood clotting. Some of these are:  Abnormal bleeding.  Nosebleeds.  Heavy menstrual periods.  Blood in the urine or stools.  Purpura. This is a purplish discoloration in the skin produced by small bleeding vessels near the surface of the skin.  Bruising.  A rash that may be petechial. This looks like pinpoint, purplish-red spots on the skin and mucous membranes. It is caused by bleeding from small blood vessels (capillaries). DIAGNOSIS  Your caregiver will make this diagnosis based on your exam and blood tests. Sometimes, a bone marrow study is done to look for the original cells (megakaryocytes) that make platelets. TREATMENT  Treatment depends on the cause of the condition.  Medicines may be given to help protect your platelets from being destroyed.  In some cases, a replacement (transfusion) of platelets may be required to stop or prevent  bleeding.  Sometimes, the spleen must be surgically removed. HOME CARE INSTRUCTIONS   Check the skin and linings inside your mouth for bruising or bleeding as directed by your caregiver.  Check your sputum, urine, and stool for blood as directed by your caregiver.  Do not return to any activities that could cause bumps or bruises until your caregiver says it is okay.  Take extra care not to cut yourself when shaving or when using scissors, needles, knives, and other tools.  Take extra care not to burn yourself when ironing or cooking.  Ask your caregiver if it is okay for you to drink alcohol.  Only take over-the-counter or prescription medicines as directed by your caregiver.  Notify all your caregivers, including dentists and eye doctors, about your condition. SEEK IMMEDIATE MEDICAL CARE IF:   You develop active bleeding from anywhere in your body.  You develop unexplained bruising or bleeding.  You have blood in your sputum, urine, or stool. MAKE SURE YOU:  Understand these instructions.  Will watch your condition.  Will get help right away if you are not doing well or get worse. Document Released: 06/17/2005 Document Revised: 09/09/2011 Document Reviewed: 04/19/2011 [facility] Patient Information 2015 [place], [place]. This information is not intended to replace advice given to you by your health care provider. Make sure you discuss any questions you have with your health care provider.   Neutropenia Neutropenia is a condition that occurs when the level of a certain type of white blood cell (neutrophil) in your body becomes lower than normal. Neutrophils are made in the bone marrow and fight infections. These cells protect against bacteria and viruses. The fewer neutrophils you have, and the longer your  body remains without them, the greater your risk of getting a severe infection becomes. CAUSES  The cause of neutropenia may be hard to determine. However, it is usually due  to 3 main problems:   Decreased production of neutrophils. This may be due to:  Certain medicines such as chemotherapy.  Genetic problems.  Cancer.  Radiation treatments.  Vitamin deficiency.  Some pesticides.  Increased destruction of neutrophils. This may be due to:  Overwhelming infections.  Hemolytic anemia. This is when the body destroys its own blood cells.  Chemotherapy.  Neutrophils moving to areas of the body where they cannot fight infections. This may be due to:  Dialysis procedures.  Conditions where the spleen becomes enlarged. Neutrophils are held in the spleen and are not available to the rest of the body.  Overwhelming infections. The neutrophils are held in the area of the infection and are not available to the rest of the body. SYMPTOMS  There are no specific symptoms of neutropenia. The lack of neutrophils can result in an infection, and an infection can cause various problems. DIAGNOSIS  Diagnosis is made by a blood test. A complete blood count is performed. The normal level of neutrophils in human blood differs with age and race. Infants have lower counts than older children and adults. African Americans have lower counts than Caucasians or Asians. The average adult level is 1500 cells/mm3 of blood. Neutrophil counts are interpreted as follows:  Greater than 1000 cells/mm3 gives normal protection against infection.  500 to 1000 cells/mm3 gives an increased risk for infection.  200 to 500 cells/mm3 is a greater risk for severe infection.  Lower than 200 cells/mm3 is a marked risk of infection. This may require hospitalization and treatment with antibiotic medicines. TREATMENT  Treatment depends on the underlying cause, severity, and presence of infections or symptoms. It also depends on your health. Your caregiver will discuss the treatment plan with you. Mild cases are often easily treated and have a good outcome. Preventative measures may also be  started to limit your risk of infections. Treatment can include:  Taking antibiotics.  Stopping medicines that are known to cause neutropenia.  Correcting nutritional deficiencies by eating green vegetables to supply folic acid and taking vitamin B supplements.  Stopping exposure to pesticides if your neutropenia is related to pesticide exposure.  Taking a blood growth factor called sargramostim, pegfilgrastim, or filgrastim if you are undergoing chemotherapy for cancer. This stimulates white blood cell production.  Removal of the spleen if you have Felty's syndrome and have repeated infections. HOME CARE INSTRUCTIONS   Follow your caregiver's instructions about when you need to have blood work done.  Wash your hands often. Make sure others who come in contact with you also wash their hands.  Wash raw fruits and vegetables before eating them. They can carry bacteria and fungi.  Avoid people with colds or spreadable (contagious) diseases (chickenpox, herpes zoster, influenza).  Avoid large crowds.  Avoid construction areas. The dust can release fungus into the air.  Be cautious around children in daycare or school environments.  Take care of your respiratory system by coughing and deep breathing.  Bathe daily.  Protect your skin from cuts and burns.  Do not work in the garden or with flowers and plants.  Care for the mouth before and after meals by brushing with a soft toothbrush. If you have mucositis, do not use mouthwash. Mouthwash contains alcohol and can dry out the mouth even more.  Clean the area  between the genitals and the anus (perineal area) after urination and bowel movements. Women need to wipe from front to back.  Use a water soluble lubricant during sexual intercourse and practice good hygiene after. Do not have intercourse if you are severely neutropenic. Check with your caregiver for guidelines.  Exercise daily as tolerated.  Avoid people who were  vaccinated with a live vaccine in the past 30 days. You should not receive live vaccines (polio, typhoid).  Do not provide direct care for pets. Avoid animal droppings. Do not clean litter boxes and bird cages.  Do not share food utensils.  Do not use tampons, enemas, or rectal suppositories unless directed by your caregiver.  Use an electric razor to remove hair.  Wash your hands after handling magazines, letters, and newspapers. SEEK IMMEDIATE MEDICAL CARE IF:   You have a fever.  You have chills or start to shake.  You feel nauseous or vomit.  You develop mouth sores.  You develop aches and pains.  You have redness and swelling around open wounds.  Your skin is warm to the touch.  You have pus coming from your wounds.  You develop swollen lymph nodes.  You feel weak or fatigued.  You develop red streaks on the skin. MAKE SURE YOU:  Understand these instructions.  Will watch your condition.  Will get help right away if you are not doing well or get worse. Document Released: 12/07/2001 Document Revised: 09/09/2011 Document Reviewed: 01/04/2011 [facility] Patient Information 2015 [place], [place]. This information is not intended to replace advice given to you by your health care provider. Make sure you discuss any questions you have with your health care provider.

## 2014-05-24 NOTE — Telephone Encounter (Signed)
lmtcb for pt.  

## 2014-05-25 ENCOUNTER — Other Ambulatory Visit: Payer: Self-pay | Admitting: Physician Assistant

## 2014-05-25 ENCOUNTER — Ambulatory Visit (HOSPITAL_BASED_OUTPATIENT_CLINIC_OR_DEPARTMENT_OTHER): Payer: Medicare Other

## 2014-05-25 ENCOUNTER — Telehealth: Payer: Self-pay | Admitting: *Deleted

## 2014-05-25 VITALS — BP 110/53 | HR 56 | Temp 97.4°F

## 2014-05-25 DIAGNOSIS — D709 Neutropenia, unspecified: Secondary | ICD-10-CM

## 2014-05-25 DIAGNOSIS — Z5189 Encounter for other specified aftercare: Secondary | ICD-10-CM

## 2014-05-25 DIAGNOSIS — C3412 Malignant neoplasm of upper lobe, left bronchus or lung: Secondary | ICD-10-CM

## 2014-05-25 MED ORDER — TBO-FILGRASTIM 300 MCG/0.5ML ~~LOC~~ SOSY
300.0000 ug | PREFILLED_SYRINGE | Freq: Once | SUBCUTANEOUS | Status: AC
  Administered 2014-05-25: 300 ug via SUBCUTANEOUS
  Filled 2014-05-25: qty 0.5

## 2014-05-25 NOTE — Patient Instructions (Signed)

## 2014-05-25 NOTE — Telephone Encounter (Signed)
She had biopsy on the left, and it is unclear whether her symptoms are related.  The biopsy was high up in the chest, and she is describing abdominal discomfort.  I really think she needs to be evaluated, with exam and xray.  If she can see her primary today that is good, since our office is closed.  If she gets worse, she may need to go to ER for evaluation over the holiday.

## 2014-05-25 NOTE — Telephone Encounter (Signed)
Pt's daughter called left msg stating that pt was dizzy this morning and feeling nauseated.  They wanted to know if this was r/t the granix.  Called and spoke to pt.  No fever, she is not feeling nauseated anymore but her head feels dizzy.  She has been eating and drinking.  Informed Dr Vista Mink, advised that when she comes in for injection appt today and she is still feeling dizzy she may need to let our triage department see her and possible have her seen in our symptom mgmt clinic.  She verbalized understanding.

## 2014-05-25 NOTE — Telephone Encounter (Signed)
Pt states that she is having left sided abdominal soreness and "tightness" since biopsy 04/20/14 Pt states that the pain is starting to radiate over to the right side this morning.  Pt states that it is very tender to the touch and bruised.   Pt reports that she has been getting Lovenox injections in her abdomen. I asked the patient if she notices this bruising and pain AFTER she gets an injection as these could be coming from the medication, pt states she is unsure.  Pt cannot come in for OV this AM, would like to know if this could be coming from the injections or if it could be from the Biopsy? Pt advised that she may need to go see her PCP this afternoon once transportation is available to her.  Please advise Dr Gwenette Greet as Dr Lake Bells is unavailable. Thanks.

## 2014-05-25 NOTE — Telephone Encounter (Signed)
Pt aware of rec's per [facility] Pt states that she is very dizzy now and does not know what to do. I advised the patient to contact her PCP to see if she can get an appt today, pt states that she does not know if they are open. Advised the patient to just call them to check and see--pt states that she does not know what she is going to do. Pt was advised to try to contact her daughter and son-in-law again to come and get her, pt states that she has attempted to call her kids and no one is answering. Pt states that she is too dizzy to get up and walk across the room to get her nausea medication. Offered to call the patient's daughter for her, pt refused stating that she does not want me to call. I advised the patient that if her symptoms are this severe that she needs to call EMS to come to her home. Pt states that she does not know what she is going to do. I advised the patient that there was nothing more over the phone that I could offer her other than the rec's already given. I asked the pt again if she was going to contact her PCP, family or EMS for help, pt stated again that she did not know what she was going to do and would decide. Pt then stated that her son-in-law was supposed to be coming to get her today at 3:15 for her Lovenox injection but she has not heard from him. Refused again to let me contact her family for her.  I reiterated hat rec's per Dr Gwenette Greet and advised the pt to seek emergency care , pt verbalized understanding. Will send to [facility]Q as FYI.

## 2014-05-27 ENCOUNTER — Encounter (HOSPITAL_COMMUNITY): Payer: Self-pay | Admitting: *Deleted

## 2014-05-27 ENCOUNTER — Inpatient Hospital Stay (HOSPITAL_COMMUNITY): Payer: Medicare Other

## 2014-05-27 ENCOUNTER — Inpatient Hospital Stay (HOSPITAL_COMMUNITY)
Admission: EM | Admit: 2014-05-27 | Discharge: 2014-06-06 | DRG: 314 | Disposition: A | Discharge status: Skilled Nursing Facility | Payer: Medicare Other | Attending: Pulmonary Disease | Admitting: Pulmonary Disease

## 2014-05-27 ENCOUNTER — Other Ambulatory Visit: Payer: Self-pay

## 2014-05-27 ENCOUNTER — Emergency Department (HOSPITAL_COMMUNITY): Payer: Medicare Other

## 2014-05-27 ENCOUNTER — Telehealth: Payer: Self-pay | Admitting: *Deleted

## 2014-05-27 DIAGNOSIS — Z91048 Other nonmedicinal substance allergy status: Secondary | ICD-10-CM

## 2014-05-27 DIAGNOSIS — C3400 Malignant neoplasm of unspecified main bronchus: Secondary | ICD-10-CM | POA: Diagnosis present

## 2014-05-27 DIAGNOSIS — I4819 Other persistent atrial fibrillation: Secondary | ICD-10-CM

## 2014-05-27 DIAGNOSIS — I82890 Acute embolism and thrombosis of other specified veins: Secondary | ICD-10-CM | POA: Diagnosis present

## 2014-05-27 DIAGNOSIS — I872 Venous insufficiency (chronic) (peripheral): Secondary | ICD-10-CM

## 2014-05-27 DIAGNOSIS — K219 Gastro-esophageal reflux disease without esophagitis: Secondary | ICD-10-CM | POA: Diagnosis present

## 2014-05-27 DIAGNOSIS — I7090 Unspecified atherosclerosis: Secondary | ICD-10-CM | POA: Diagnosis present

## 2014-05-27 DIAGNOSIS — E039 Hypothyroidism, unspecified: Secondary | ICD-10-CM | POA: Diagnosis present

## 2014-05-27 DIAGNOSIS — M1990 Unspecified osteoarthritis, unspecified site: Secondary | ICD-10-CM | POA: Diagnosis present

## 2014-05-27 DIAGNOSIS — R609 Edema, unspecified: Secondary | ICD-10-CM

## 2014-05-27 DIAGNOSIS — D500 Iron deficiency anemia secondary to blood loss (chronic): Secondary | ICD-10-CM

## 2014-05-27 DIAGNOSIS — S301XXD Contusion of abdominal wall, subsequent encounter: Secondary | ICD-10-CM

## 2014-05-27 DIAGNOSIS — I517 Cardiomegaly: Secondary | ICD-10-CM | POA: Diagnosis present

## 2014-05-27 DIAGNOSIS — R58 Hemorrhage, not elsewhere classified: Secondary | ICD-10-CM | POA: Diagnosis present

## 2014-05-27 DIAGNOSIS — R918 Other nonspecific abnormal finding of lung field: Secondary | ICD-10-CM

## 2014-05-27 DIAGNOSIS — N179 Acute kidney failure, unspecified: Secondary | ICD-10-CM

## 2014-05-27 DIAGNOSIS — I482 Chronic atrial fibrillation: Secondary | ICD-10-CM | POA: Diagnosis present

## 2014-05-27 DIAGNOSIS — D61818 Other pancytopenia: Secondary | ICD-10-CM

## 2014-05-27 DIAGNOSIS — R0602 Shortness of breath: Secondary | ICD-10-CM

## 2014-05-27 DIAGNOSIS — Z86711 Personal history of pulmonary embolism: Secondary | ICD-10-CM | POA: Diagnosis not present

## 2014-05-27 DIAGNOSIS — R42 Dizziness and giddiness: Secondary | ICD-10-CM | POA: Diagnosis present

## 2014-05-27 DIAGNOSIS — R001 Bradycardia, unspecified: Secondary | ICD-10-CM | POA: Diagnosis present

## 2014-05-27 DIAGNOSIS — Z90710 Acquired absence of both cervix and uterus: Secondary | ICD-10-CM

## 2014-05-27 DIAGNOSIS — Z881 Allergy status to other antibiotic agents status: Secondary | ICD-10-CM | POA: Diagnosis not present

## 2014-05-27 DIAGNOSIS — R578 Other shock: Secondary | ICD-10-CM | POA: Diagnosis present

## 2014-05-27 DIAGNOSIS — E875 Hyperkalemia: Secondary | ICD-10-CM | POA: Diagnosis present

## 2014-05-27 DIAGNOSIS — I10 Essential (primary) hypertension: Secondary | ICD-10-CM | POA: Diagnosis present

## 2014-05-27 DIAGNOSIS — E041 Nontoxic single thyroid nodule: Secondary | ICD-10-CM | POA: Diagnosis present

## 2014-05-27 DIAGNOSIS — F329 Major depressive disorder, single episode, unspecified: Secondary | ICD-10-CM | POA: Diagnosis present

## 2014-05-27 DIAGNOSIS — J449 Chronic obstructive pulmonary disease, unspecified: Secondary | ICD-10-CM | POA: Diagnosis present

## 2014-05-27 DIAGNOSIS — Z7901 Long term (current) use of anticoagulants: Secondary | ICD-10-CM

## 2014-05-27 DIAGNOSIS — K661 Hemoperitoneum: Secondary | ICD-10-CM | POA: Diagnosis present

## 2014-05-27 DIAGNOSIS — I480 Paroxysmal atrial fibrillation: Secondary | ICD-10-CM | POA: Diagnosis present

## 2014-05-27 DIAGNOSIS — I481 Persistent atrial fibrillation: Secondary | ICD-10-CM

## 2014-05-27 DIAGNOSIS — I739 Peripheral vascular disease, unspecified: Secondary | ICD-10-CM | POA: Diagnosis present

## 2014-05-27 DIAGNOSIS — I959 Hypotension, unspecified: Secondary | ICD-10-CM | POA: Diagnosis present

## 2014-05-27 DIAGNOSIS — C801 Malignant (primary) neoplasm, unspecified: Secondary | ICD-10-CM

## 2014-05-27 DIAGNOSIS — C3490 Malignant neoplasm of unspecified part of unspecified bronchus or lung: Secondary | ICD-10-CM | POA: Diagnosis present

## 2014-05-27 DIAGNOSIS — I824Y9 Acute embolism and thrombosis of unspecified deep veins of unspecified proximal lower extremity: Secondary | ICD-10-CM | POA: Diagnosis present

## 2014-05-27 DIAGNOSIS — C3402 Malignant neoplasm of left main bronchus: Secondary | ICD-10-CM

## 2014-05-27 DIAGNOSIS — I809 Phlebitis and thrombophlebitis of unspecified site: Secondary | ICD-10-CM | POA: Diagnosis present

## 2014-05-27 DIAGNOSIS — R410 Disorientation, unspecified: Secondary | ICD-10-CM | POA: Diagnosis present

## 2014-05-27 DIAGNOSIS — Z95828 Presence of other vascular implants and grafts: Secondary | ICD-10-CM

## 2014-05-27 DIAGNOSIS — J9601 Acute respiratory failure with hypoxia: Secondary | ICD-10-CM

## 2014-05-27 DIAGNOSIS — I4891 Unspecified atrial fibrillation: Secondary | ICD-10-CM | POA: Diagnosis present

## 2014-05-27 DIAGNOSIS — G9340 Encephalopathy, unspecified: Secondary | ICD-10-CM

## 2014-05-27 DIAGNOSIS — R579 Shock, unspecified: Secondary | ICD-10-CM

## 2014-05-27 DIAGNOSIS — Z86718 Personal history of other venous thrombosis and embolism: Secondary | ICD-10-CM

## 2014-05-27 DIAGNOSIS — D6859 Other primary thrombophilia: Secondary | ICD-10-CM | POA: Diagnosis present

## 2014-05-27 HISTORY — DX: Other shock: R57.8

## 2014-05-27 LAB — COMPREHENSIVE METABOLIC PANEL
ALBUMIN: 3.1 g/dL — AB (ref 3.5–5.2)
ALK PHOS: 86 U/L (ref 39–117)
ALT: 383 U/L — AB (ref 0–35)
ALT: 359 U/L — ABNORMAL HIGH (ref 0–35)
AST: 315 U/L — ABNORMAL HIGH (ref 0–37)
AST: 249 U/L — AB (ref 0–37)
Albumin: 3 g/dL — ABNORMAL LOW (ref 3.5–5.2)
Alkaline Phosphatase: 95 U/L (ref 39–117)
Anion gap: 14 (ref 5–15)
Anion gap: 12 (ref 5–15)
BUN: 59 mg/dL — ABNORMAL HIGH (ref 6–23)
BUN: 57 mg/dL — ABNORMAL HIGH (ref 6–23)
CALCIUM: 8 mg/dL — AB (ref 8.4–10.5)
CO2: 21 meq/L (ref 19–32)
CO2: 21 mEq/L (ref 19–32)
Calcium: 8.5 mg/dL (ref 8.4–10.5)
Chloride: 95 mEq/L — ABNORMAL LOW (ref 96–112)
Chloride: 102 mEq/L (ref 96–112)
Creatinine, Ser: 4.3 mg/dL — ABNORMAL HIGH (ref 0.50–1.10)
Creatinine, Ser: 3.67 mg/dL — ABNORMAL HIGH (ref 0.50–1.10)
GFR calc non Af Amer: 11 mL/min — ABNORMAL LOW (ref >90)
GFR, EST AFRICAN AMERICAN: 10 mL/min — AB (ref >90)
GFR, EST AFRICAN AMERICAN: 13 mL/min — AB (ref >90)
GFR, EST NON AFRICAN AMERICAN: 9 mL/min — AB (ref >90)
Glucose, Bld: 119 mg/dL — ABNORMAL HIGH (ref 70–99)
Glucose, Bld: 120 mg/dL — ABNORMAL HIGH (ref 70–99)
POTASSIUM: 4.2 meq/L (ref 3.7–5.3)
Potassium: 4.5 mEq/L (ref 3.7–5.3)
SODIUM: 130 meq/L — AB (ref 137–147)
SODIUM: 135 meq/L — AB (ref 137–147)
Total Bilirubin: 0.5 mg/dL (ref 0.3–1.2)
Total Bilirubin: 0.6 mg/dL (ref 0.3–1.2)
Total Protein: 5.9 g/dL — ABNORMAL LOW (ref 6.0–8.3)
Total Protein: 6.1 g/dL (ref 6.0–8.3)

## 2014-05-27 LAB — CBC WITH DIFFERENTIAL/PLATELET
Basophils Absolute: 0 10*3/uL (ref 0.0–0.1)
Basophils Relative: 0 % (ref 0–1)
EOS ABS: 0 10*3/uL (ref 0.0–0.7)
Eosinophils Relative: 0 % (ref 0–5)
HCT: 16 % — ABNORMAL LOW (ref 36.0–46.0)
Hemoglobin: 5.2 g/dL — CL (ref 12.0–15.0)
LYMPHS ABS: 1 10*3/uL (ref 0.7–4.0)
Lymphocytes Relative: 22 % (ref 12–46)
MCH: 32.1 pg (ref 26.0–34.0)
MCHC: 32.5 g/dL (ref 30.0–36.0)
MCV: 98.8 fL (ref 78.0–100.0)
MONO ABS: 0.9 10*3/uL (ref 0.1–1.0)
Monocytes Relative: 21 % — ABNORMAL HIGH (ref 3–12)
NEUTROS PCT: 57 % (ref 43–77)
Neutro Abs: 2.6 10*3/uL (ref 1.7–7.7)
PLATELETS: 46 10*3/uL — AB (ref 150–400)
RBC: 1.62 MIL/uL — ABNORMAL LOW (ref 3.87–5.11)
RDW: 13.2 % (ref 11.5–15.5)
WBC: 4.5 10*3/uL (ref 4.0–10.5)

## 2014-05-27 LAB — URINALYSIS, ROUTINE W REFLEX MICROSCOPIC
GLUCOSE, UA: NEGATIVE mg/dL
HGB URINE DIPSTICK: NEGATIVE
Ketones, ur: NEGATIVE mg/dL
Nitrite: NEGATIVE
Protein, ur: 30 mg/dL — AB
Specific Gravity, Urine: 1.024 (ref 1.005–1.030)
UROBILINOGEN UA: 1 mg/dL (ref 0.0–1.0)
pH: 5 (ref 5.0–8.0)

## 2014-05-27 LAB — CBC
HCT: 21.1 % — ABNORMAL LOW (ref 36.0–46.0)
HEMOGLOBIN: 7 g/dL — AB (ref 12.0–15.0)
MCH: 31.5 pg (ref 26.0–34.0)
MCHC: 33.2 g/dL (ref 30.0–36.0)
MCV: 95 fL (ref 78.0–100.0)
Platelets: 53 10*3/uL — ABNORMAL LOW (ref 150–400)
RBC: 2.22 MIL/uL — ABNORMAL LOW (ref 3.87–5.11)
RDW: 15 % (ref 11.5–15.5)
WBC: 6.7 10*3/uL (ref 4.0–10.5)

## 2014-05-27 LAB — BLOOD GAS, ARTERIAL
Acid-base deficit: 4.6 mmol/L — ABNORMAL HIGH (ref 0.0–2.0)
BICARBONATE: 21.3 meq/L (ref 20.0–24.0)
Drawn by: 331471
O2 Saturation: 94.6 %
PATIENT TEMPERATURE: 98.6
TCO2: 21.2 mmol/L (ref 0–100)
pCO2 arterial: 47.5 mmHg — ABNORMAL HIGH (ref 35.0–45.0)
pH, Arterial: 7.273 — ABNORMAL LOW (ref 7.350–7.450)
pO2, Arterial: 75.6 mmHg — ABNORMAL LOW (ref 80.0–100.0)

## 2014-05-27 LAB — PHOSPHORUS: Phosphorus: 3.3 mg/dL (ref 2.3–4.6)

## 2014-05-27 LAB — POC OCCULT BLOOD, ED: FECAL OCCULT BLD: NEGATIVE

## 2014-05-27 LAB — CORTISOL: Cortisol, Plasma: 28.4 ug/dL

## 2014-05-27 LAB — PROCALCITONIN: PROCALCITONIN: 1.23 ng/mL

## 2014-05-27 LAB — ABO/RH: ABO/RH(D): O POS

## 2014-05-27 LAB — APTT: aPTT: 43 seconds — ABNORMAL HIGH (ref 24–37)

## 2014-05-27 LAB — STREP PNEUMONIAE URINARY ANTIGEN: Strep Pneumo Urinary Antigen: NEGATIVE

## 2014-05-27 LAB — MRSA PCR SCREENING: MRSA BY PCR: POSITIVE — AB

## 2014-05-27 LAB — MAGNESIUM: Magnesium: 1.9 mg/dL (ref 1.5–2.5)

## 2014-05-27 LAB — PROTIME-INR
INR: 1.12 (ref 0.00–1.49)
Prothrombin Time: 14.5 seconds (ref 11.6–15.2)

## 2014-05-27 LAB — URINE MICROSCOPIC-ADD ON

## 2014-05-27 LAB — TROPONIN I: Troponin I: <0.3 ng/mL (ref <0.30)

## 2014-05-27 LAB — PREPARE RBC (CROSSMATCH)

## 2014-05-27 LAB — I-STAT CG4 LACTIC ACID, ED: LACTIC ACID, VENOUS: 1.73 mmol/L (ref 0.5–2.2)

## 2014-05-27 MED ORDER — SODIUM CHLORIDE 0.9 % IV SOLN
10.0000 mL/h | Freq: Once | INTRAVENOUS | Status: AC
  Administered 2014-05-27: 10 mL/h via INTRAVENOUS

## 2014-05-27 MED ORDER — PHENYLEPHRINE HCL 10 MG/ML IJ SOLN
30.0000 ug/min | INTRAVENOUS | Status: DC
  Administered 2014-05-27: 30 ug/min via INTRAVENOUS
  Administered 2014-05-27: 60 ug/min via INTRAVENOUS
  Administered 2014-05-27: 100 ug/min via INTRAVENOUS
  Filled 2014-05-27 (×3): qty 1

## 2014-05-27 MED ORDER — SODIUM CHLORIDE 0.9 % IV SOLN
1000.0000 mL | Freq: Once | INTRAVENOUS | Status: AC
  Administered 2014-05-27: 1000 mL via INTRAVENOUS

## 2014-05-27 MED ORDER — PHENYLEPHRINE HCL 10 MG/ML IJ SOLN
30.0000 ug/min | INTRAVENOUS | Status: DC
  Administered 2014-05-28: 100 ug/min via INTRAVENOUS
  Administered 2014-05-28: 130 ug/min via INTRAVENOUS
  Administered 2014-05-28: 100 ug/min via INTRAVENOUS
  Administered 2014-05-28: 140 ug/min via INTRAVENOUS
  Administered 2014-05-29: 125 ug/min via INTRAVENOUS
  Administered 2014-05-29: 200 ug/min via INTRAVENOUS
  Administered 2014-05-29: 160 ug/min via INTRAVENOUS
  Administered 2014-05-30 (×3): 125 ug/min via INTRAVENOUS
  Administered 2014-05-30: 190 ug/min via INTRAVENOUS
  Administered 2014-05-30 (×2): 200 ug/min via INTRAVENOUS
  Administered 2014-05-31: 90 ug/min via INTRAVENOUS
  Administered 2014-05-31: 75 ug/min via INTRAVENOUS
  Administered 2014-05-31: 175 ug/min via INTRAVENOUS
  Administered 2014-05-31: 50 ug/min via INTRAVENOUS
  Administered 2014-05-31: 100 ug/min via INTRAVENOUS
  Filled 2014-05-27 (×20): qty 4

## 2014-05-27 MED ORDER — PIPERACILLIN-TAZOBACTAM IN DEX 2-0.25 GM/50ML IV SOLN
2.2500 g | Freq: Four times a day (QID) | INTRAVENOUS | Status: DC
  Administered 2014-05-27 – 2014-05-30 (×11): 2.25 g via INTRAVENOUS
  Filled 2014-05-27 (×13): qty 50

## 2014-05-27 MED ORDER — LEVOTHYROXINE SODIUM 100 MCG IV SOLR
44.0000 ug | Freq: Every day | INTRAVENOUS | Status: DC
  Administered 2014-05-27 – 2014-05-31 (×5): 44 ug via INTRAVENOUS
  Filled 2014-05-27 (×5): qty 5

## 2014-05-27 MED ORDER — SODIUM CHLORIDE 0.9 % IV BOLUS (SEPSIS)
500.0000 mL | Freq: Once | INTRAVENOUS | Status: AC
Start: 2014-05-27 — End: 2014-05-27
  Administered 2014-05-27: 500 mL via INTRAVENOUS

## 2014-05-27 MED ORDER — SODIUM CHLORIDE 0.9 % IV SOLN
1000.0000 mL | INTRAVENOUS | Status: DC
  Administered 2014-05-27 – 2014-05-29 (×3): 1000 mL via INTRAVENOUS

## 2014-05-27 MED ORDER — ACETAMINOPHEN 10 MG/ML IV SOLN
1000.0000 mg | Freq: Once | INTRAVENOUS | Status: AC
  Administered 2014-05-27: 1000 mg via INTRAVENOUS
  Filled 2014-05-27: qty 100

## 2014-05-27 MED ORDER — CHLORHEXIDINE GLUCONATE 0.12 % MT SOLN
15.0000 mL | Freq: Two times a day (BID) | OROMUCOSAL | Status: DC
  Administered 2014-05-27 – 2014-06-05 (×16): 15 mL via OROMUCOSAL
  Filled 2014-05-27 (×19): qty 15

## 2014-05-27 MED ORDER — SODIUM CHLORIDE 0.9 % IV SOLN
INTRAVENOUS | Status: DC
  Administered 2014-05-27: 100 mL/h via INTRAVENOUS
  Administered 2014-05-28 (×2): via INTRAVENOUS

## 2014-05-27 MED ORDER — SODIUM CHLORIDE 0.9 % IV BOLUS (SEPSIS)
1000.0000 mL | Freq: Once | INTRAVENOUS | Status: AC
  Administered 2014-05-27: 1000 mL via INTRAVENOUS

## 2014-05-27 NOTE — Progress Notes (Signed)
eLink Physician-Brief Progress Note Patient Name: Brittney Thomas DOB: [birth year] MRN: [id number]   Date of Service  05/27/2014  HPI/Events of Note  CT abdomen result reviewed> rectus sheath hematoma, also question of infectious process given stranding History and exam were not supportive of infection, but given shock will treat for infection  eICU Interventions  Start imipenem Check procalcitonin> would consider early discontinuation of antibiotics if low     Intervention Category Major Interventions: Hypotension - evaluation and management;Infection - evaluation and management Intermediate Interventions: Diagnostic test evaluation  Gwen Edler 05/27/2014, 6:21 PM

## 2014-05-27 NOTE — Progress Notes (Signed)
eLink Physician-Brief Progress Note Patient Name: Brittney Thomas DOB: 30-Jun-1933 MRN: [id number]   Date of Service  05/27/2014  HPI/Events of Note  LE doppler > Left leg superficial thrombus, no DVT  eICU Interventions  Hold off on IVC filter as no DVT Serial leg exams, consider repeat LE doppler next week       MCQUAID, DOUGLAS 05/27/2014, 6:15 PM

## 2014-05-27 NOTE — Progress Notes (Signed)
VASCULAR LAB PRELIMINARY  PRELIMINARY  PRELIMINARY  PRELIMINARY  Bilateral lower extremity venous duplex completed.    Preliminary report:  Right:  No evidence of DVT, superficial thrombosis, or Baker's cyst. Left - No evidence of DVT. Positive for known superficial thrombus of the entire greater saphenous vein originally noted 10/16/ 2015. No evidence of a left Baker's cyst.  Shawntez Dickison, RVS 05/27/2014, 5:04 PM

## 2014-05-27 NOTE — Progress Notes (Signed)
Changed phenylephrine from standard to quadruple strength. Communicated this by phone to the patient's nurse, Lattie Haw.  Romeo Rabon, PharmD, pager [phone]. 05/27/2014,11:41 PM.

## 2014-05-27 NOTE — Progress Notes (Signed)
Ur completed     

## 2014-05-27 NOTE — ED Provider Notes (Signed)
CSN: [id number]     Arrival date & time 05/27/14  5462 History   First MD Initiated Contact with Patient 05/27/14 1006     Chief Complaint  Patient presents with  . Hypotension     (Consider location/radiation/quality/duration/timing/severity/associated sxs/prior Treatment) The history is provided by the patient and the EMS personnel.   80 year old female with history of lung cancer being treated with chemotherapy was brought in by EMS because of hypotension. There were called to the home because of possible loss of consciousness. Patient states that she has been dizzy and lightheaded for several days but she denies chest pain, heaviness, tightness, pressure. She denies hurting anywhere. There's been no nausea or vomiting or diarrhea. She denies fever or chills. EMS noted initial blood pressure 68/40 which increases 73/30 after 500 mL fluid bolus.  Past Medical History  Diagnosis Date  . Ulcer 06-08-13    Left medial ankle  . Hypertension   . Thyroid disease     Hypo-Thyroidism  . Esophageal reflux   . COPD (chronic obstructive pulmonary disease)   . Peripheral vascular disease   . Hypothyroidism   . Insomnia     takes lorazepam  . Arthritis   . Dysrhythmia   . DVT (deep venous thrombosis) 2008  . Paroxysmal a-fib 2008  . PE (pulmonary thromboembolism) 2008  . Wears dentures    Past Surgical History  Procedure Laterality Date  . Tonsillectomy    . Eye surgery Right Aug. 2014    Cataract  . Eye surgery Left Oct. 2014    Cataract  . Abdominal hysterectomy  1984    Total  . Video bronchoscopy with endobronchial navigation N/A 04/20/2014    Procedure: VIDEO BRONCHOSCOPY WITH ENDOBRONCHIAL NAVIGATION;  Surgeon: Collene Gobble, MD;  Location: [facility];  Service: Thoracic;  Laterality: N/A;  . Video bronchoscopy with endobronchial ultrasound N/A 04/20/2014    Procedure: VIDEO BRONCHOSCOPY WITH ENDOBRONCHIAL ULTRASOUND;  Surgeon: Collene Gobble, MD;  Location: [facility] OR;  Service:  Thoracic;  Laterality: N/A;   Family History  Problem Relation Age of Onset  . Heart disease Maternal Grandmother   . Cancer Brother     kidney  . Stroke Brother    History  Substance Use Topics  . Smoking status: Never Smoker   . Smokeless tobacco: Never Used  . Alcohol Use: No   OB History    No data available     Review of Systems  All other systems reviewed and are negative.     Allergies  Poison ivy extract; Sulfa antibiotics; and Sulfacetamide sodium  Home Medications   Prior to Admission medications   Medication Sig Start Date End Date Taking? Authorizing Provider  acetaminophen (TYLENOL) 500 MG tablet Take 500 mg by mouth every 6 (six) hours as needed (pain).    Historical Provider, MD  acetaZOLAMIDE (DIAMOX) 250 MG tablet Take 250 mg by mouth every evening.     Historical Provider, MD  calcium-vitamin D (OSCAL WITH D) 500-200 MG-UNIT per tablet Take 1 tablet by mouth daily with breakfast.     Historical Provider, MD  carvedilol (COREG) 6.25 MG tablet Take 6.25 mg by mouth 2 (two) times daily.     Historical Provider, MD  dexamethasone (DECADRON) 4 MG tablet 4 mg by mouth twice a day the day before, day of and day after the chemotherapy every 3 weeks Patient taking differently: Take 4 mg by mouth See admin instructions. 4 mg by mouth twice a day the day  before, day of and day after the chemotherapy every 3 weeks 05/03/14   Curt Bears, MD  digoxin (LANOXIN) 0.25 MG tablet Take 0.25 mg by mouth every evening.     Historical Provider, MD  enoxaparin (LOVENOX) 80 MG/0.8ML injection Inject 80 mg into the skin every 12 (twelve) hours.  04/16/14   Historical Provider, MD  folic acid (FOLVITE) 1 MG tablet Take 1 tablet (1 mg total) by mouth daily. 05/03/14   Curt Bears, MD  furosemide (LASIX) 40 MG tablet Take 20 mg by mouth every morning.     Historical Provider, MD  levothyroxine (SYNTHROID, LEVOTHROID) 88 MCG tablet Take 88 mcg by mouth daily before breakfast.     Historical Provider, MD  lisinopril (PRINIVIL,ZESTRIL) 10 MG tablet Take 10 mg by mouth at bedtime.     Historical Provider, MD  LORazepam (ATIVAN) 1 MG tablet Take 0.5 mg by mouth at bedtime as needed for sleep.     Historical Provider, MD  Omega-3 Fatty Acids (SALMON OIL-1000 PO) Take by mouth.    Historical Provider, MD  Omega-3 Fatty Acids (SALMON OIL-1000) 200 MG CAPS Take 1,000 mg by mouth every evening.    Historical Provider, MD  prochlorperazine (COMPAZINE) 10 MG tablet Take 1 tablet (10 mg total) by mouth every 6 (six) hours as needed for nausea or vomiting. 05/03/14   Curt Bears, MD  theophylline (THEODUR) 200 MG 12 hr tablet Take 200 mg by mouth 2 (two) times daily.    Historical Provider, MD   BP 78/36 mmHg  Pulse 98  Temp(Src) 98.2 F (36.8 C) (Oral)  Resp 18  SpO2 93% Physical Exam  Nursing note and vitals reviewed.  80 year old female, resting comfortably and in no acute distress. Vital signs are significant for hypertension. Oxygen saturation is 90%, which is hypoxic. Head is normocephalic and atraumatic. PERRLA, EOMI. Oropharynx is clear. Neck is nontender and supple without adenopathy or JVD. Back is nontender and there is no CVA tenderness. Lungs are clear without rales, wheezes, or rhonchi. Chest is nontender. Heart has an irregular rhythm without murmur. Heart rate is 108. Abdomen is soft, flat, nontender and mildly distended. Bladder is distended to the umbilicus. Ecchymoses are present from enoxaparin injections. There are no other masses or hepatosplenomegaly and peristalsis is normoactive. Extremities have 2+ edema, full range of motion is present. Venous stasis changes are present. Skin is warm and dry without rash. Neurologic: Mental status is normal, cranial nerves are intact, there are no motor or sensory deficits.  ED Course  Procedures (including critical care time) Labs Review Results for orders placed or performed during the hospital encounter of  05/27/14  Comprehensive metabolic panel  Result Value Ref Range   Sodium 130 (L) 137 - 147 mEq/L   Potassium 4.2 3.7 - 5.3 mEq/L   Chloride 95 (L) 96 - 112 mEq/L   CO2 21 19 - 32 mEq/L   Glucose, Bld 119 (H) 70 - 99 mg/dL   BUN 59 (H) 6 - 23 mg/dL   Creatinine, Ser 4.30 (H) 0.50 - 1.10 mg/dL   Calcium 8.5 8.4 - 10.5 mg/dL   Total Protein 5.9 (L) 6.0 - 8.3 g/dL   Albumin 3.0 (L) 3.5 - 5.2 g/dL   AST 315 (H) 0 - 37 U/L   ALT 383 (H) 0 - 35 U/L   Alkaline Phosphatase 86 39 - 117 U/L   Total Bilirubin 0.5 0.3 - 1.2 mg/dL   GFR calc non Af Amer 9 (L) >  90 mL/min   GFR calc Af Amer 10 (L) >90 mL/min   Anion gap 14 5 - 15  CBC with Differential  Result Value Ref Range   WBC 4.5 4.0 - 10.5 K/uL   RBC 1.62 (L) 3.87 - 5.11 MIL/uL   Hemoglobin 5.2 (LL) 12.0 - 15.0 g/dL   HCT 16.0 (L) 36.0 - 46.0 %   MCV 98.8 78.0 - 100.0 fL   MCH 32.1 26.0 - 34.0 pg   MCHC 32.5 30.0 - 36.0 g/dL   RDW 13.2 11.5 - 15.5 %   Platelets 46 (L) 150 - 400 K/uL   Neutrophils Relative % 57 43 - 77 %   Lymphocytes Relative 22 12 - 46 %   Monocytes Relative 21 (H) 3 - 12 %   Eosinophils Relative 0 0 - 5 %   Basophils Relative 0 0 - 1 %   Neutro Abs 2.6 1.7 - 7.7 K/uL   Lymphs Abs 1.0 0.7 - 4.0 K/uL   Monocytes Absolute 0.9 0.1 - 1.0 K/uL   Eosinophils Absolute 0.0 0.0 - 0.7 K/uL   Basophils Absolute 0.0 0.0 - 0.1 K/uL   WBC Morphology DOHLE BODIES   Troponin I  Result Value Ref Range   Troponin I <0.30 <0.30 ng/mL  Urinalysis, Routine w reflex microscopic  Result Value Ref Range   Color, Urine AMBER (A) YELLOW   APPearance TURBID (A) CLEAR   Specific Gravity, Urine 1.024 1.005 - 1.030   pH 5.0 5.0 - 8.0   Glucose, UA NEGATIVE NEGATIVE mg/dL   Hgb urine dipstick NEGATIVE NEGATIVE   Bilirubin Urine MODERATE (A) NEGATIVE   Ketones, ur NEGATIVE NEGATIVE mg/dL   Protein, ur 30 (A) NEGATIVE mg/dL   Urobilinogen, UA 1.0 0.0 - 1.0 mg/dL   Nitrite NEGATIVE NEGATIVE   Leukocytes, UA SMALL (A) NEGATIVE  Urine  microscopic-add on  Result Value Ref Range   Urine-Other AMORPHOUS URATES/PHOSPHATES   Blood gas, arterial  Result Value Ref Range   O2 Content ROOM AIR L/min   pH, Arterial 7.273 (L) 7.350 - 7.450   pCO2 arterial 47.5 (H) 35.0 - 45.0 mmHg   pO2, Arterial 75.6 (L) 80.0 - 100.0 mmHg   Bicarbonate 21.3 20.0 - 24.0 mEq/L   TCO2 21.2 0 - 100 mmol/L   Acid-base deficit 4.6 (H) 0.0 - 2.0 mmol/L   O2 Saturation 94.6 %   Patient temperature 98.6    Collection site RIGHT RADIAL    Drawn by [id number]    Sample type ARTERIAL DRAW    Allens test (pass/fail) PASS PASS  I-Stat CG4 Lactic Acid, ED  Result Value Ref Range   Lactic Acid, Venous 1.73 0.5 - 2.2 mmol/L  POC occult blood, ED Provider will collect  Result Value Ref Range   Fecal Occult Bld NEGATIVE NEGATIVE  Prepare RBC  Result Value Ref Range   Order Confirmation ORDER PROCESSED BY BLOOD BANK   Type and screen  Result Value Ref Range   ABO/RH(D) O POS    Antibody Screen NEG    Sample Expiration 05/30/2014    Unit Number [id number]    Blood Component Type RED CELLS,LR    Unit division 00    Status of Unit ISSUED    Transfusion Status OK TO TRANSFUSE    Crossmatch Result Compatible    Unit Number [id number]    Blood Component Type RBC LR PHER1    Unit division 00    Status of Unit ALLOCATED    Transfusion  Status OK TO TRANSFUSE    Crossmatch Result Compatible   ABO/Rh  Result Value Ref Range   ABO/RH(D) O POS    Imaging Review Ct Abdomen Pelvis Wo Contrast  05/27/2014   CLINICAL DATA:  80 year old with adenocarcinoma of the lung, increased dizziness, low remote  EXAM: CT ABDOMEN AND PELVIS WITHOUT CONTRAST  TECHNIQUE: Multidetector CT imaging of the abdomen and pelvis was performed following the standard protocol without IV contrast.  COMPARISON:  None.  FINDINGS: Chest:The partially visualized chest demonstrates an enlarged heart. There is elevation of left hemidiaphragm. No suspicious pulmonary nodule or mass seen  the visualized lungs. Areas of dependent atelectasis are present.  Liver: Scattered hypodensities are noted throughout the liver and are indeterminate on this unenhanced evaluation, given history of malignancy findings are suspicious for malignancy. The largest lesion is noted within the left hepatic lobe, adjacent to the falciform ligament and measures approximately 4.1 x 2.8 cm.  Gallbladder: Unremarkable.  Spleen: Unremarkable.  Pancreas: Unremarkable.  Adrenal glands: Unremarkable.  Kidneys: A partially exophytic hypodense left upper pole renal lesion likely represents a cyst. 2 tiny nonobstructing calcifications are noted in the upper pole of the left kidney. The right kidney is unremarkable. There is no hydronephrosis.  A Foley catheter is noted within a decompressed urinary bladder. Tiny foci of gas within urinary bladder corresponds to catheterization.  Bowel/gastrointestinal tract: There is no evidence for bowel obstruction. There is no abnormal bowel wall thickening  Pelvis: A large heterogeneous mass with the pelvis with associated extension to the anterior abdominal wall and left rectus abdominus muscle. This finding appears to be contiguous and represents a single a multiloculated heterogeneous mass (sagittal image 70). These findings are most compatible with hematomas. The pelvic component measures up to approximately 17.5 x 8.0 cm. The rectus abdominal component measures up to 8.5 x 5.6 cm in transverse and AP dimension respectively. Inflammatory fat stranding is noted about this collection, question an underlying infectious process.  The uterus appears to be surgically absent.  Miscellaneous:  There is no pelvic or retroperitoneal adenopathy  Osseous structures: There is no acute osseous abnormality. Multilevel degenerative changes of the spine are present. There is slight thoracolumbar S-shaped scoliosis. A sclerotic approximately 12 mm lesion involving the posterior right lateral aspect of the L4  vertebral body is present.  IMPRESSION: 1. Large rectus sheath/pelvic hematoma as described above. Fat stranding is noted about the pelvic component, question superimposed infectious process. Clinical correlation is recommended. 2. Hepatic masses are suspicious for metastatic disease given history of lung cancer. These findings are incompletely evaluated on unenhanced CT evaluation. 3. L4 sclerotic lesion is indeterminate, osseous metastatic disease cannot be excluded. 4. Cardiomegaly.   Electronically Signed   By: Rosemarie Ax   On: 05/27/2014 15:29   Dg Chest Port 1 View  05/27/2014   CLINICAL DATA:  Hypotension  EXAM: PORTABLE CHEST - 1 VIEW  COMPARISON:  05/13/2014  FINDINGS: Cardiac shadow is mildly enlarged but stable. The lungs are well aerated bilaterally. No focal infiltrate or sizable effusion is seen. No acute bony abnormality is noted.  IMPRESSION: No active disease.   Electronically Signed   By: Inez Catalina M.D.   On: 05/27/2014 10:53     EKG Interpretation   Date/Time:  Friday May 27 2014 10:21:33 EST Ventricular Rate:  105 PR Interval:    QRS Duration: 87 QT Interval:  305 QTC Calculation: 403 R Axis:   53 Text Interpretation:  Atrial fibrillation Repol abnrm suggests ischemia,  diffuse leads When compared with ECG of 04/19/2014, REPOLARIZATION  ABNORMALITY is now Present Confirmed by [facility]  MD, Arriona Prest ([id number]) on  05/27/2014 10:40:27 AM      CRITICAL CARE Performed by: [email or web address],FGHWE Total critical care time: 150 minutes Critical care time was exclusive of separately billable procedures and treating other patients. Critical care was necessary to treat or prevent imminent or life-threatening deterioration. Critical care was time spent personally by me on the following activities: development of treatment plan with patient and/or surrogate as well as nursing, discussions with consultants, evaluation of patient's response to treatment, examination of patient, obtaining  history from patient or surrogate, ordering and performing treatments and interventions, ordering and review of laboratory studies, ordering and review of radiographic studies, pulse oximetry and re-evaluation of patient's condition.  MDM   Final diagnoses:  Hypotension  Blood loss anemia  AKI (acute kidney injury)  Pancytopenia    Hypotension of uncertain cause. Patient does not appear toxic and I do not believe that she is septic. However, she will be evaluated for occult infection. IV fluids will be administered. Old records were reviewed confirming recent initiation of chemotherapy for stage IV lung cancer.  Blood pressure initially came up with IV fluid but has dropped back down. Hemoglobin has come back very low at 5.2. Lactic acid level is normal. Massive lower abdomen which I thought was distended bladder has not on weight with placing Foley catheter so this will need to be evaluated further. 3 days ago, hemoglobin was 8.4, and platelet count was 12,000. WBC was also very low and she did receive an injection to increase her WBC count and WBC is up to 4.5. Platelet count is pending at this time. Blood has been ordered for transfusion and she will need to be admitted.   Case was first discussed with hospitalist who was concerned that he was too sick for their service and requested he be in thyroid of by pulmonary/critical care. Patient has been seen and evaluated by Dr. Lake Bells who agrees to admit the patient. Blood pressure has improved with blood transfusion. Platelet count has increased to 46,000. I suspect that she had a retroperitoneal bleed to account for blood loss and dropping hemoglobin and blood pressure. CT scan has been ordered by admitting physician.  Delora Fuel, MD 99/37/16 [id number]

## 2014-05-27 NOTE — H&P (Addendum)
PULMONARY / CRITICAL CARE MEDICINE   Name: Brittney Thomas MRN: [id number] DOB: [birth year]    ADMISSION DATE:  05/27/2014   REFERRING MD :  EDP  CHIEF COMPLAINT: Dizziness  INITIAL PRESENTATION: Shock/dizziness  STUDIES:  11/27 ct abd/pelvis>>  SIGNIFICANT EVENTS: Presented to ED 11/27 shock   HISTORY OF PRESENT ILLNESS:    80 yo WF dx with adenocarcinoma of lung via FOB in 10/15. She is on LMWH injections for hx CAF/DVT and was seen in Oncology clinic 11/25 and was given   Granix injection for neutropenia. Family reports steady decline with dizziness and lethargy. She presents to [facility] ED 11/27 and was found to have hemoglobin of 5.2 and exam reveals findings consistent with rectal sheath hematoma. Her shock is complicated by bradycardia from beta blockers. PCCM will admit to ICU, hold anticoagulation and cardiac medications and obtain CT abd for confirmation of suspected bleed.   PAST MEDICAL HISTORY :   has a past medical history of Ulcer (06-08-13); Hypertension; Thyroid disease; Esophageal reflux; COPD (chronic obstructive pulmonary disease); Peripheral vascular disease; Hypothyroidism; Insomnia; Arthritis; Dysrhythmia; DVT (deep venous thrombosis) (2008); Paroxysmal a-fib (2008); PE (pulmonary thromboembolism) (2008); and Wears dentures.  has past surgical history that includes Tonsillectomy; Eye surgery (Right, Aug. 2014); Eye surgery (Left, Oct. 2014); Abdominal hysterectomy (1984); Video bronchoscopy with endobronchial navigation (N/A, 04/20/2014); and Video bronchoscopy with endobronchial ultrasound (N/A, 04/20/2014). Prior to Admission medications   Medication Sig Start Date End Date Taking? Authorizing Provider  acetaminophen (TYLENOL) 500 MG tablet Take 500 mg by mouth every 6 (six) hours as needed (pain).   Yes Historical Provider, MD  acetaZOLAMIDE (DIAMOX) 250 MG tablet Take 250 mg by mouth every evening.    Yes Historical Provider, MD  calcium-vitamin D (OSCAL WITH D)  500-200 MG-UNIT per tablet Take 1 tablet by mouth daily with breakfast.    Yes Historical Provider, MD  carvedilol (COREG) 6.25 MG tablet Take 6.25 mg by mouth 2 (two) times daily.    Yes Historical Provider, MD  dexamethasone (DECADRON) 4 MG tablet 4 mg by mouth twice a day the day before, day of and day after the chemotherapy every 3 weeks Patient taking differently: Take 4 mg by mouth See admin instructions. 4 mg by mouth twice a day the day before, day of and day after the chemotherapy every 3 weeks 05/03/14  Yes Curt Bears, MD  digoxin (LANOXIN) 0.25 MG tablet Take 0.25 mg by mouth every evening.    Yes Historical Provider, MD  enoxaparin (LOVENOX) 80 MG/0.8ML injection Inject 80 mg into the skin every 12 (twelve) hours.  04/16/14  Yes Historical Provider, MD  folic acid (FOLVITE) 1 MG tablet Take 1 tablet (1 mg total) by mouth daily. 05/03/14  Yes Curt Bears, MD  furosemide (LASIX) 40 MG tablet Take 20 mg by mouth every morning.    Yes Historical Provider, MD  levothyroxine (SYNTHROID, LEVOTHROID) 88 MCG tablet Take 88 mcg by mouth daily before breakfast.   Yes Historical Provider, MD  lisinopril (PRINIVIL,ZESTRIL) 10 MG tablet Take 10 mg by mouth at bedtime.    Yes Historical Provider, MD  LORazepam (ATIVAN) 1 MG tablet Take 0.5 mg by mouth at bedtime as needed for sleep.    Yes Historical Provider, MD  Omega-3 Fatty Acids (SALMON OIL-1000 PO) Take 1 tablet by mouth daily.    Yes Historical Provider, MD  prochlorperazine (COMPAZINE) 10 MG tablet Take 1 tablet (10 mg total) by mouth every 6 (six) hours as needed for  nausea or vomiting. 05/03/14  Yes Curt Bears, MD  theophylline (THEODUR) 200 MG 12 hr tablet Take 200 mg by mouth 2 (two) times daily.   Yes Historical Provider, MD   Allergies  Allergen Reactions  . Poison Ivy Extract [Extract Of Poison Ivy] Rash  . Sulfa Antibiotics Rash  . Sulfacetamide Sodium Rash    FAMILY HISTORY:  indicated that her mother is deceased. She  indicated that her father is deceased.  SOCIAL HISTORY:  reports that she has never smoked. She has never used smokeless tobacco. She reports that she does not drink alcohol or use illicit drugs.  REVIEW OF SYSTEMS:   10 point review of system taken, please see HPI for positives and negatives.   SUBJECTIVE:   VITAL SIGNS: Temp:  [97.6 F (36.4 C)-98.2 F (36.8 C)] 97.6 F (36.4 C) (11/27 1345) Pulse Rate:  [40-98] 93 (11/27 1345) Resp:  [14-22] 21 (11/27 1345) BP: (73-90)/(29-44) 78/43 mmHg (11/27 1345) SpO2:  [90 %-96 %] 96 % (11/27 1345) HEMODYNAMICS:   VENTILATOR SETTINGS:   INTAKE / OUTPUT:  Intake/Output Summary (Last 24 hours) at 05/27/14 1417 Last data filed at 05/27/14 1345  Gross per 24 hour  Intake    800 ml  Output      0 ml  Net    800 ml    PHYSICAL EXAMINATION: General:  Frail ewm Neuro:  Follows commands HEENT:  +jvd/-LAN Cardiovascular:  HSIR IR Lungs: Decreased in bases Abdomen:  Tense, decreased bs, luq hard area with ecchymosis Musculoskeletal:  Lower ext chronic vascular changes Skin:  Cool, ++ lower ext edma  LABS:  CBC  Recent Labs Lab 05/24/14 1002 05/27/14 1117  WBC 1.3* 4.5  HGB 8.4* 5.2*  HCT 26.2* 16.0*  PLT 12* 46*   Coag's No results for input(s): APTT, INR in the last 168 hours. BMET  Recent Labs Lab 05/24/14 1002 05/27/14 1117  NA 139 130*  K 3.9 4.2  CL  --  95*  CO2 27 21  BUN 33.4* 59*  CREATININE 1.1 4.30*  GLUCOSE 85 119*   Electrolytes  Recent Labs Lab 05/24/14 1002 05/27/14 1117  CALCIUM 8.7 8.5   Sepsis Markers  Recent Labs Lab 05/27/14 1113  LATICACIDVEN 1.73   ABG No results for input(s): PHART, PCO2ART, PO2ART in the last 168 hours. Liver Enzymes  Recent Labs Lab 05/24/14 1002 05/27/14 1117  AST 22 315*  ALT 26 383*  ALKPHOS 94 86  BILITOT 0.40 0.5  ALBUMIN 3.5 3.0*   Cardiac Enzymes  Recent Labs Lab 05/27/14 1117  TROPONINI <0.30   Glucose No results for input(s):  GLUCAP in the last 168 hours.  Imaging No results found.   ASSESSMENT / PLAN:  PULMONARY OETT A: No acute issues Metastatic adenocarcinoma of lung, never smoker P:     CARDIOVASCULAR CVL  A:  Hypotension from blood loss anemia, hemorrhagic shock HTN DVT hx of Chronic anticoagulation CAF P:  Hold all antihypertensives Fluids resuscitation Transfuse Hold anticoagulants  Follow lactic acid Admit to ICU  RENAL Lab Results  Component Value Date   CREATININE 4.30* 05/27/2014   CREATININE 1.1 05/24/2014   CREATININE 1.1 05/16/2014   CREATININE 1.4* 05/10/2014   CREATININE 0.85 04/19/2014    A:   New onset renal failure > presumably due to anemia/hypotension P:   IV hydration CT Abd /pelvis check for uretal obstruction/bladder compression and foley placement  GASTROINTESTINAL A:   Rectus sheath bleed suspected P:   NPO PPI CT  abd  HEMATOLOGIC A:  Blood loss anemia > suspect rectal sheath hematoma, check for retroperitoneal hematoma; ddx includes GI bleed but no evidence Supratherapeutic lovenox dosing in setting of renal failure DVT left leg P:   Transfuse 2 U now Serial cbc Hold anticoagulants  Hgb transfusion goal Hgb < 7gm/dL Check LE doppler ultrasound> may need IVC filter  INFECTIOUS A:   No acute issue P:    ENDOCRINE A:    Thyroid disease P:   IV synthroid at half dose  NEUROLOGIC A:  No acute issue P:   Hold all sedatives   FAMILY  - Updates: Daughter and patient updated at bedside.  - Inter-disciplinary family meet or Palliative Care meeting due by:  day 7   TODAY'S SUMMARY:   80 yo WF dx with adenocarcinoma of lung via FOB in 10/15. She is on LMWH injections for hx CAF/DVT and was seen in Oncology clinic 11/25 and was given   Granix injection for neutropenia. Family reports steady decline with dizziness and lethargy. She presents to [facility] ED 11/27 and was found to have hemoglobin of 5.2 and exam reveals findings consistent  with rectal sheath hematoma. Her shock is complicated by bradycardia from beta blockers. PCCM will admit to ICU, hold anticoagulation and cardiac medications and obtain CT abd for confirmation of suspected bleed.   Richardson Landry Minor ACNP [facility] Shape PCCM Pager [phone] till 3 pm If no answer page [phone] 05/27/2014, 2:28 PM   Attending:  I have seen and examined the patient with nurse practitioner/resident and agree with the note above.   Ms. Pagel is currently resting comfortably, O2 saturation 100% on RA, BP low but mental status is OK and her lactic acid is normal.  No indication for pressors, will hold anticoagulation, look for source of bleed with CT Ab/pelv and will transfuse PRBC.  Watch carefully.  Daughter updated at bedside  CC time by me 45 minutes  Roselie Awkward, MD [place] [facility] Pager: [phone] Cell: [phone] If no response, call [phone]

## 2014-05-27 NOTE — ED Notes (Signed)
Bed: RESA Expected date:  Expected time:  Means of arrival:  Comments: hypotension

## 2014-05-27 NOTE — Progress Notes (Signed)
ANTIBIOTIC CONSULT NOTE - INITIAL  Pharmacy Consult for piperacillin/tazobactam Indication: intra-abdominal  Allergies  Allergen Reactions  . Poison Ivy Extract [Extract Of Poison Ivy] Rash  . Sulfa Antibiotics Rash  . Sulfacetamide Sodium Rash    Patient Measurements: Height: 5\' 8"  (172.7 cm) Weight: 163 lb 5.8 oz (74.1 kg) IBW/kg (Calculated) : 63.9 Adjusted Body Weight:   Vital Signs: Temp: 98.1 F (36.7 C) (11/27 1606) Temp Source: Oral (11/27 1606) BP: 74/27 mmHg (11/27 1815) Pulse Rate: 37 (11/27 1801) Intake/Output from previous day:   Intake/Output from this shift: Total I/O In: 800 [I.V.:500; Blood:300] Out: -   Labs:  Recent Labs  05/27/14 1117  WBC 4.5  HGB 5.2*  PLT 46*  CREATININE 4.30*   Estimated Creatinine Clearance: 10.7 mL/min (by C-G formula based on Cr of 4.3). No results for input(s): VANCOTROUGH, VANCOPEAK, VANCORANDOM, GENTTROUGH, GENTPEAK, GENTRANDOM, TOBRATROUGH, TOBRAPEAK, TOBRARND, AMIKACINPEAK, AMIKACINTROU, AMIKACIN in the last 72 hours.   Microbiology: Recent Results (from the past 720 hour(s))  Urine culture     Status: None   Collection Time: 04/29/14  5:11 PM  Result Value Ref Range Status   Colony Count 70,000 COLONIES/ML  Final   Organism ID, Bacteria Multiple bacterial morphotypes present, none  Final   Organism ID, Bacteria predominant. Suggest appropriate recollection if   Final   Organism ID, Bacteria clinically indicated.  Final    Medical History: Past Medical History  Diagnosis Date  . Ulcer 06-08-13    Left medial ankle  . Hypertension   . Thyroid disease     Hypo-Thyroidism  . Esophageal reflux   . COPD (chronic obstructive pulmonary disease)   . Peripheral vascular disease   . Hypothyroidism   . Insomnia     takes lorazepam  . Arthritis   . Dysrhythmia   . DVT (deep venous thrombosis) 2008  . Paroxysmal a-fib 2008  . PE (pulmonary thromboembolism) 2008  . Wears dentures    Assessment: 34 YOF  presents with dizziness and hemorrhagic shock . She has lung cancer and on LMWH for Afib and DVT.  CT of abdomen 11/27 Large rectus sheath/pelvic hematoma as described above. Fat stranding is noted about the pelvic component, question superimposed infectious process. Zosyn ordered per pharmacy for possible intra-abdominal infection per CT.   11/27 >>zosyn  >>  Tmax: afeb WBCs: neutropenia resolved (ANC > 1000) Renal: ARF with est CrCl < 76ml/min  11/27 blood:   Drug level / dose changes info:  Goal of Therapy:  Dose for renal function and indication  Plan:   Zosyn 2.25gm IV q6h  Monitor renal function and urine output and adjust dose accordingly  Doreene Eland, PharmD, BCPS.   Pager: [phone]  05/27/2014,6:23 PM

## 2014-05-27 NOTE — ED Notes (Signed)
Patient transported to CT 

## 2014-05-27 NOTE — Telephone Encounter (Signed)
Pt's daughter [name] called stating that pt is unable to get out of bed and cannot walk to the restroom.  She is wanting to know what should be done.  Advised that pt needs to be evaluated in the [facility] ED.  She verbalized understanding.

## 2014-05-27 NOTE — Progress Notes (Signed)
Nurse asked me about hgb    Recent Labs Lab 05/24/14 1002 05/27/14 1117 05/27/14 2220  HGB 8.4* 5.2* 7.0*      Recent Labs Lab 05/27/14 1117  TROPONINI <0.30      Patient responded from 5,2 > 7gm% after 2 U PRBC  Still on pressors at neo 81mcg and perssors need did NOT improve/NOT worsen after 2 U PRBC  Per RN hematoma size is significant but not worse  NExt CBC only at 7am  A Hypotension could be related to bleeding   P PRBC fog hgb < 7g% or active bleeding with bp/hr instability - so recheck cbc in at 0.20 and call elink with results RN told to call box if more hypotensive    Dr. Brand Males, M.D., [facility].C.P Pulmonary and Critical Care Medicine Staff Physician [place] [facility] Pager: [phone], If no answer or between  15:00h - 7:00h: call [phone]  05/27/2014 11:27 PM     .

## 2014-05-27 NOTE — ED Notes (Signed)
Floor RN unable to take report at this time, they will call back later.

## 2014-05-27 NOTE — ED Notes (Signed)
Per EMS pt coming from home with c/o hypotension. EMS sts they were called for LOC however on their arrival pt was A+Ox4 and with b/p of 68/40. They stared an IV and gave about half a liter NS bolus. Last b/p was 73/30

## 2014-05-27 NOTE — ED Notes (Signed)
Unable to obtain labs, phlebotomy to try next

## 2014-05-28 ENCOUNTER — Inpatient Hospital Stay (HOSPITAL_COMMUNITY): Payer: Medicare Other

## 2014-05-28 DIAGNOSIS — R579 Shock, unspecified: Secondary | ICD-10-CM

## 2014-05-28 LAB — CBC
HCT: 19.8 % — ABNORMAL LOW (ref 36.0–46.0)
HCT: 24.9 % — ABNORMAL LOW (ref 36.0–46.0)
HEMOGLOBIN: 8.2 g/dL — AB (ref 12.0–15.0)
Hemoglobin: 6.5 g/dL — CL (ref 12.0–15.0)
MCH: 31.1 pg (ref 26.0–34.0)
MCH: 31.1 pg (ref 26.0–34.0)
MCHC: 32.8 g/dL (ref 30.0–36.0)
MCHC: 32.9 g/dL (ref 30.0–36.0)
MCV: 94.3 fL (ref 78.0–100.0)
MCV: 94.7 fL (ref 78.0–100.0)
Platelets: 56 10*3/uL — ABNORMAL LOW (ref 150–400)
Platelets: 68 10*3/uL — ABNORMAL LOW (ref 150–400)
RBC: 2.64 MIL/uL — ABNORMAL LOW (ref 3.87–5.11)
RBC: 2.09 MIL/uL — AB (ref 3.87–5.11)
RDW: 15.3 % (ref 11.5–15.5)
RDW: 15.6 % — ABNORMAL HIGH (ref 11.5–15.5)
WBC: 7.1 10*3/uL (ref 4.0–10.5)
WBC: 6.4 10*3/uL (ref 4.0–10.5)

## 2014-05-28 LAB — CBC WITH DIFFERENTIAL/PLATELET
Basophils Absolute: 0.1 10*3/uL (ref 0.0–0.1)
Basophils Relative: 1 % (ref 0–1)
EOS PCT: 1 % (ref 0–5)
Eosinophils Absolute: 0.1 10*3/uL (ref 0.0–0.7)
HEMATOCRIT: 23.6 % — AB (ref 36.0–46.0)
Hemoglobin: 7.6 g/dL — ABNORMAL LOW (ref 12.0–15.0)
LYMPHS ABS: 1.4 10*3/uL (ref 0.7–4.0)
Lymphocytes Relative: 18 % (ref 12–46)
MCH: 30.6 pg (ref 26.0–34.0)
MCHC: 32.2 g/dL (ref 30.0–36.0)
MCV: 95.2 fL (ref 78.0–100.0)
MONO ABS: 1.4 10*3/uL — AB (ref 0.1–1.0)
MONOS PCT: 19 % — AB (ref 3–12)
NEUTROS ABS: 4.6 10*3/uL (ref 1.7–7.7)
Neutrophils Relative %: 61 % (ref 43–77)
Platelets: 68 10*3/uL — ABNORMAL LOW (ref 150–400)
RBC: 2.48 MIL/uL — AB (ref 3.87–5.11)
RDW: 16 % — ABNORMAL HIGH (ref 11.5–15.5)
WBC: 7.6 10*3/uL (ref 4.0–10.5)

## 2014-05-28 LAB — PROCALCITONIN: Procalcitonin: 0.98 ng/mL

## 2014-05-28 LAB — BASIC METABOLIC PANEL
ANION GAP: 10 (ref 5–15)
BUN: 54 mg/dL — ABNORMAL HIGH (ref 6–23)
CALCIUM: 7.7 mg/dL — AB (ref 8.4–10.5)
CHLORIDE: 98 meq/L (ref 96–112)
CO2: 22 mEq/L (ref 19–32)
Creatinine, Ser: 3.05 mg/dL — ABNORMAL HIGH (ref 0.50–1.10)
GFR calc Af Amer: 16 mL/min — ABNORMAL LOW (ref >90)
GFR calc non Af Amer: 14 mL/min — ABNORMAL LOW (ref >90)
Glucose, Bld: 121 mg/dL — ABNORMAL HIGH (ref 70–99)
Potassium: 4.6 mEq/L (ref 3.7–5.3)
SODIUM: 130 meq/L — AB (ref 137–147)

## 2014-05-28 LAB — PREPARE RBC (CROSSMATCH)

## 2014-05-28 MED ORDER — MIDAZOLAM HCL 2 MG/2ML IJ SOLN
INTRAMUSCULAR | Status: AC
  Filled 2014-05-28: qty 2

## 2014-05-28 MED ORDER — MUPIROCIN 2 % EX OINT
1.0000 "application " | TOPICAL_OINTMENT | Freq: Two times a day (BID) | CUTANEOUS | Status: AC
  Administered 2014-05-28 – 2014-06-01 (×9): 1 via NASAL
  Filled 2014-05-28 (×2): qty 22

## 2014-05-28 MED ORDER — MIDAZOLAM HCL 2 MG/2ML IJ SOLN: 1.0000 mg | Freq: Once | INTRAMUSCULAR | Status: DC

## 2014-05-28 MED ORDER — MORPHINE SULFATE 2 MG/ML IJ SOLN
INTRAMUSCULAR | Status: AC
  Filled 2014-05-28: qty 1

## 2014-05-28 MED ORDER — MORPHINE SULFATE 2 MG/ML IJ SOLN
2.0000 mg | INTRAMUSCULAR | Status: DC | PRN
  Administered 2014-05-28 – 2014-05-30 (×4): 2 mg via INTRAVENOUS
  Filled 2014-05-28 (×3): qty 1

## 2014-05-28 MED ORDER — SODIUM CHLORIDE 0.9 % IV SOLN
Freq: Once | INTRAVENOUS | Status: AC
  Administered 2014-05-28: 21:00:00 via INTRAVENOUS

## 2014-05-28 MED ORDER — SODIUM CHLORIDE 0.9 % IV SOLN
Freq: Once | INTRAVENOUS | Status: AC
  Administered 2014-05-28: 03:00:00 via INTRAVENOUS

## 2014-05-28 MED ORDER — CHLORHEXIDINE GLUCONATE CLOTH 2 % EX PADS
6.0000 | MEDICATED_PAD | Freq: Every day | CUTANEOUS | Status: AC
  Administered 2014-05-28 – 2014-06-01 (×5): 6 via TOPICAL

## 2014-05-28 NOTE — Progress Notes (Signed)
   Recent Labs Lab 05/27/14 1809  INR 1.12    Recent Labs Lab 05/27/14 1117 05/27/14 2220 05/28/14 0024 05/28/14 0640 05/28/14 1810  PLT 46* 53* 56* 68* 68*     Recent Labs Lab 05/27/14 1117 05/27/14 2220 05/28/14 0024 05/28/14 0640 05/28/14 1810  HGB 5.2* 7.0* 6.5* 8.2* 7.6*   Currently overall pressors need same Hgb again down-drfit  Plan 1 set of platelets No role of surgical intervention   D/w Dr Lamonte Sakai the beside MD  Dr. Brand Males, M.D., [facility].C.P Pulmonary and Critical Care Medicine Staff Physician [place] [facility] Pager: [phone], If no answer or between  15:00h - 7:00h: call [phone]  05/28/2014 7:22 PM

## 2014-05-28 NOTE — Progress Notes (Signed)
eLink Physician-Brief Progress Note Patient Name: SAYAKA HOEPPNER DOB: [birth year] MRN: [id number]   Date of Service  05/28/2014  HPI/Events of Note  Pt with symptomatic anemia and hypotension  eICU Interventions  Transfuse one unit prbc , cont neo drip     Intervention Category Intermediate Interventions: Bleeding - evaluation and treatment with blood products  Asencion Noble 05/28/2014, 1:02 AM

## 2014-05-28 NOTE — Progress Notes (Signed)
PULMONARY / CRITICAL CARE MEDICINE   Name: Brittney Thomas MRN: [id number] DOB: Sep 29, 1932    ADMISSION DATE:  05/27/2014   REFERRING MD :  EDP  CHIEF COMPLAINT: Dizziness  INITIAL PRESENTATION: Shock/dizziness  STUDIES:  11/27 ct abd/pelvis>>  SIGNIFICANT EVENTS: Presented to ED 11/27 shock  SUBJECTIVE:    VITAL SIGNS: Temp:  [97.5 F (36.4 C)-98.2 F (36.8 C)] 97.6 F (36.4 C) (11/28 0430) Pulse Rate:  [37-98] 67 (11/28 0400) Resp:  [14-27] 15 (11/28 0430) BP: (68-112)/(23-64) 96/47 mmHg (11/28 0430) SpO2:  [90 %-100 %] 100 % (11/28 0430) Weight:  [74.1 kg (163 lb 5.8 oz)-77.7 kg (171 lb 4.8 oz)] 77.7 kg (171 lb 4.8 oz) (11/28 0500) HEMODYNAMICS:   VENTILATOR SETTINGS:   INTAKE / OUTPUT:  Intake/Output Summary (Last 24 hours) at 05/28/14 0809 Last data filed at 05/28/14 0546  Gross per 24 hour  Intake 3547.47 ml  Output    405 ml  Net 3142.47 ml    PHYSICAL EXAMINATION: General:  Frail ewm Neuro:  Follows commands HEENT:  +jvd/-LAN Cardiovascular:  HSIR IR Lungs: Decreased in bases Abdomen:  Tense, decreased bs, luq hard area with ecchymosis Musculoskeletal:  Lower ext chronic vascular changes Skin:  Cool, ++ lower ext edma  LABS:  CBC  Recent Labs Lab 05/27/14 2220 05/28/14 0024 05/28/14 0640  WBC 6.7 6.4 7.1  HGB 7.0* 6.5* 8.2*  HCT 21.1* 19.8* 24.9*  PLT 53* 56* 68*   Coag's  Recent Labs Lab 05/27/14 1809  APTT 43*  INR 1.12   BMET  Recent Labs Lab 05/27/14 1117 05/27/14 1809 05/28/14 0640  NA 130* 135* 130*  K 4.2 4.5 4.6  CL 95* 102 98  CO2 21 21 22   BUN 59* 57* 54*  CREATININE 4.30* 3.67* 3.05*  GLUCOSE 119* 120* 121*   Electrolytes  Recent Labs Lab 05/27/14 1117 05/27/14 1809 05/28/14 0640  CALCIUM 8.5 8.0* 7.7*  MG  --  1.9  --   PHOS  --  3.3  --    Sepsis Markers  Recent Labs Lab 05/27/14 1113 05/27/14 1809 05/28/14 0640  LATICACIDVEN 1.73  --   --   PROCALCITON  --  1.23 0.98   ABG  Recent  Labs Lab 05/27/14 1530  PHART 7.273*  PCO2ART 47.5*  PO2ART 75.6*   Liver Enzymes  Recent Labs Lab 05/24/14 1002 05/27/14 1117 05/27/14 1809  AST 22 315* 249*  ALT 26 383* 359*  ALKPHOS 94 86 95  BILITOT 0.40 0.5 0.6  ALBUMIN 3.5 3.0* 3.1*   Cardiac Enzymes  Recent Labs Lab 05/27/14 1117  TROPONINI <0.30   Glucose No results for input(s): GLUCAP in the last 168 hours.  Imaging Ct Abdomen Pelvis Wo Contrast  05/27/2014   CLINICAL DATA:  80 year old with adenocarcinoma of the lung, increased dizziness, low remote  EXAM: CT ABDOMEN AND PELVIS WITHOUT CONTRAST  TECHNIQUE: Multidetector CT imaging of the abdomen and pelvis was performed following the standard protocol without IV contrast.  COMPARISON:  None.  FINDINGS: Chest:The partially visualized chest demonstrates an enlarged heart. There is elevation of left hemidiaphragm. No suspicious pulmonary nodule or mass seen the visualized lungs. Areas of dependent atelectasis are present.  Liver: Scattered hypodensities are noted throughout the liver and are indeterminate on this unenhanced evaluation, given history of malignancy findings are suspicious for malignancy. The largest lesion is noted within the left hepatic lobe, adjacent to the falciform ligament and measures approximately 4.1 x 2.8 cm.  Gallbladder: Unremarkable.  Spleen: Unremarkable.  Pancreas: Unremarkable.  Adrenal glands: Unremarkable.  Kidneys: A partially exophytic hypodense left upper pole renal lesion likely represents a cyst. 2 tiny nonobstructing calcifications are noted in the upper pole of the left kidney. The right kidney is unremarkable. There is no hydronephrosis.  A Foley catheter is noted within a decompressed urinary bladder. Tiny foci of gas within urinary bladder corresponds to catheterization.  Bowel/gastrointestinal tract: There is no evidence for bowel obstruction. There is no abnormal bowel wall thickening  Pelvis: A large heterogeneous mass with the  pelvis with associated extension to the anterior abdominal wall and left rectus abdominus muscle. This finding appears to be contiguous and represents a single a multiloculated heterogeneous mass (sagittal image 70). These findings are most compatible with hematomas. The pelvic component measures up to approximately 17.5 x 8.0 cm. The rectus abdominal component measures up to 8.5 x 5.6 cm in transverse and AP dimension respectively. Inflammatory fat stranding is noted about this collection, question an underlying infectious process.  The uterus appears to be surgically absent.  Miscellaneous:  There is no pelvic or retroperitoneal adenopathy  Osseous structures: There is no acute osseous abnormality. Multilevel degenerative changes of the spine are present. There is slight thoracolumbar S-shaped scoliosis. A sclerotic approximately 12 mm lesion involving the posterior right lateral aspect of the L4 vertebral body is present.  IMPRESSION: 1. Large rectus sheath/pelvic hematoma as described above. Fat stranding is noted about the pelvic component, question superimposed infectious process. Clinical correlation is recommended. 2. Hepatic masses are suspicious for metastatic disease given history of lung cancer. These findings are incompletely evaluated on unenhanced CT evaluation. 3. L4 sclerotic lesion is indeterminate, osseous metastatic disease cannot be excluded. 4. Cardiomegaly.   Electronically Signed   By: Rosemarie Ax   On: 05/27/2014 15:29   Dg Chest Port 1 View  05/27/2014   CLINICAL DATA:  Hypotension  EXAM: PORTABLE CHEST - 1 VIEW  COMPARISON:  05/13/2014  FINDINGS: Cardiac shadow is mildly enlarged but stable. The lungs are well aerated bilaterally. No focal infiltrate or sizable effusion is seen. No acute bony abnormality is noted.  IMPRESSION: No active disease.   Electronically Signed   By: Inez Catalina M.D.   On: 05/27/2014 10:53     ASSESSMENT / PLAN:  PULMONARY OETT A: No acute  issues Metastatic adenocarcinoma of lung, never smoker;   P:   EGRF status? Will need to review [facility] data with Dr Julien Nordmann.   CARDIOVASCULAR CVL  A:  Hypotension from blood loss anemia, hemorrhagic shock, improved but remains on phenylephrine HTN DVT hx of Chronic anticoagulation CAF P:  Hold all antihypertensives Fluids resuscitation Transfuse as indicated Hold anticoagulants  Defer CVC placement at this time, continue phenylephrine via PIV  RENAL Lab Results  Component Value Date   CREATININE 3.05* 05/28/2014   CREATININE 3.67* 05/27/2014   CREATININE 4.30* 05/27/2014   CREATININE 1.1 05/24/2014   CREATININE 1.1 05/16/2014   CREATININE 1.4* 05/10/2014   A:   New onset renal failure > presumably due to anemia/hypotension, improving P:   IV hydration  GASTROINTESTINAL A:   Rectus sheath bleed  P:   NPO PPI  HEMATOLOGIC A:  Blood loss anemia > retroperitoneal bleed Supratherapeutic lovenox dosing in setting of renal failure Superficial thrombus LLE P:   Serial cbc Hold anticoagulants  Hgb transfusion goal Hgb < 7gm/dL Defer IVC filter in absence DVT (only superficial LLE clot)  INFECTIOUS A:   No acute issue P:  ENDOCRINE A:    Thyroid disease P:   IV synthroid at half dose  NEUROLOGIC A:  No acute issue P:   Hold all sedatives   FAMILY  - Updates: Daughter and patient updated at bedside by Dr Lake Bells 11/27  Baltazar Apo, MD, PhD 05/28/2014, 8:16 AM [place] [facility] [phone] or if no answer [phone]

## 2014-05-28 NOTE — Progress Notes (Signed)
CRITICAL VALUE ALERT  Critical value received:  Hgb 6.5  Date of notification:  05/28/2014  Time of notification:  0020  Critical value read back:Yes.    Nurse who received alert:  Gladys Damme, RN  MD notified (1st page):  Joya Gaskins, MD  Time of first page:  0030  MD notified (2nd page):  Time of second page:  Responding MD:  Joya Gaskins, MD  Time MD responded:  [phone]

## 2014-05-28 NOTE — Progress Notes (Signed)
Only one unit of the PRBCs will be given per Dr. Joya Gaskins. 2 were ordered. Pt is stable. Will continue to monitor.

## 2014-05-28 NOTE — Progress Notes (Addendum)
  Call from bedside RN  1. Worsening pressor needs - now neo at 232mcg. Despite platelet transfusion  -plan  - night float MD to bedside for CVL - cbc at 23.45 - lactate at 23.45 =- d/w IR on call DrDaniel Vernard Gambles - very limited role for IR for this kind of spontaneous bleed. He is recommending stat CT to see if there are other sources of bleed. Feels overall prognosis poor. Rx largely supportive  2. Patient had transient right upper extremity tremor - lasted few minutes. Brittney Thomas awake and oriented during this time. Instructed RN to give her 1mg  versed but right then it spontaneously resolved   Dr. Brand Males, M.D., [facility].C.P Pulmonary and Critical Care Medicine Staff Physician [place] [facility] Pager: [phone], If no answer or between  15:00h - 7:00h: call [phone]  05/28/2014 9:54 PM

## 2014-05-28 NOTE — Progress Notes (Signed)
Intermittent bp drop, asymptommatic. No obvious bleed or enlarging hematoma   Recent Labs Lab 05/24/14 1002 05/27/14 1117 05/27/14 2220 05/28/14 0024 05/28/14 0640  HGB 8.4* 5.2* 7.0* 6.5* 8.2*     Plan Recheck cbc stat  Dr. Brand Males, M.D., [facility].C.P Pulmonary and Critical Care Medicine Staff Physician [place] [facility] Pager: [phone], If no answer or between  15:00h - 7:00h: call [phone]  05/28/2014 5:41 PM

## 2014-05-28 NOTE — Procedures (Signed)
Central Venous Catheter Insertion Procedure Note Brittney Thomas [id number] Dec 11, 1932  Procedure: Insertion of Central Venous Catheter Indications: Assessment of intravascular volume, Drug and/or fluid administration and Frequent blood sampling  Procedure Details Consent: Risks of procedure as well as the alternatives and risks of each were explained to the (patient/caregiver).  Consent for procedure obtained. Time Out: Verified patient identification, verified procedure, site/side was marked, verified correct patient position, special equipment/implants available, medications/allergies/relevent history reviewed, required imaging and test results available.  Performed  Maximum sterile technique was used including antiseptics, cap, gloves, gown, hand hygiene, mask and sheet. Skin prep: Chlorhexidine; local anesthetic administered A antimicrobial bonded/coated triple lumen catheter was placed in the left subclavian vein using the Seldinger technique.  Evaluation Blood flow good Complications: No apparent complications Patient did tolerate procedure well. Chest X-ray ordered to verify placement.  CXR: pending.  Kell Ferris 05/28/2014, 11:13 PM

## 2014-05-29 ENCOUNTER — Inpatient Hospital Stay (HOSPITAL_COMMUNITY): Payer: Medicare Other

## 2014-05-29 LAB — COMPREHENSIVE METABOLIC PANEL
ALBUMIN: 2.7 g/dL — AB (ref 3.5–5.2)
ALT: 169 U/L — ABNORMAL HIGH (ref 0–35)
AST: 57 U/L — AB (ref 0–37)
Alkaline Phosphatase: 86 U/L (ref 39–117)
Anion gap: 12 (ref 5–15)
BUN: 52 mg/dL — ABNORMAL HIGH (ref 6–23)
CO2: 21 meq/L (ref 19–32)
CREATININE: 2.6 mg/dL — AB (ref 0.50–1.10)
Calcium: 7.4 mg/dL — ABNORMAL LOW (ref 8.4–10.5)
Chloride: 100 mEq/L (ref 96–112)
GFR calc Af Amer: 19 mL/min — ABNORMAL LOW (ref >90)
GFR, EST NON AFRICAN AMERICAN: 16 mL/min — AB (ref >90)
Glucose, Bld: 126 mg/dL — ABNORMAL HIGH (ref 70–99)
Potassium: 4.8 mEq/L (ref 3.7–5.3)
Sodium: 133 mEq/L — ABNORMAL LOW (ref 137–147)
Total Bilirubin: 0.8 mg/dL (ref 0.3–1.2)
Total Protein: 5.6 g/dL — ABNORMAL LOW (ref 6.0–8.3)

## 2014-05-29 LAB — CBC WITH DIFFERENTIAL/PLATELET
Basophils Absolute: 0.1 10*3/uL (ref 0.0–0.1)
Basophils Relative: 1 % (ref 0–1)
Eosinophils Absolute: 0.1 10*3/uL (ref 0.0–0.7)
Eosinophils Relative: 1 % (ref 0–5)
HCT: 21 % — ABNORMAL LOW (ref 36.0–46.0)
Hemoglobin: 7 g/dL — ABNORMAL LOW (ref 12.0–15.0)
LYMPHS PCT: 18 % (ref 12–46)
Lymphs Abs: 1.5 10*3/uL (ref 0.7–4.0)
MCH: 31.7 pg (ref 26.0–34.0)
MCHC: 33.3 g/dL (ref 30.0–36.0)
MCV: 95 fL (ref 78.0–100.0)
MONOS PCT: 23 % — AB (ref 3–12)
Monocytes Absolute: 1.9 10*3/uL — ABNORMAL HIGH (ref 0.1–1.0)
Neutro Abs: 4.8 10*3/uL (ref 1.7–7.7)
Neutrophils Relative %: 57 % (ref 43–77)
PLATELETS: 108 10*3/uL — AB (ref 150–400)
RBC: 2.21 MIL/uL — ABNORMAL LOW (ref 3.87–5.11)
RDW: 15.9 % — ABNORMAL HIGH (ref 11.5–15.5)
WBC: 8.4 10*3/uL (ref 4.0–10.5)

## 2014-05-29 LAB — CBC
HCT: 26.3 % — ABNORMAL LOW (ref 36.0–46.0)
HEMATOCRIT: 22.6 % — AB (ref 36.0–46.0)
Hemoglobin: 7.5 g/dL — ABNORMAL LOW (ref 12.0–15.0)
Hemoglobin: 8.7 g/dL — ABNORMAL LOW (ref 12.0–15.0)
MCH: 30.6 pg (ref 26.0–34.0)
MCH: 30.7 pg (ref 26.0–34.0)
MCHC: 33.2 g/dL (ref 30.0–36.0)
MCHC: 33.1 g/dL (ref 30.0–36.0)
MCV: 92.2 fL (ref 78.0–100.0)
MCV: 92.9 fL (ref 78.0–100.0)
Platelets: 117 10*3/uL — ABNORMAL LOW (ref 150–400)
Platelets: 111 10*3/uL — ABNORMAL LOW (ref 150–400)
RBC: 2.45 MIL/uL — AB (ref 3.87–5.11)
RBC: 2.83 MIL/uL — AB (ref 3.87–5.11)
RDW: 16.1 % — ABNORMAL HIGH (ref 11.5–15.5)
RDW: 16.1 % — ABNORMAL HIGH (ref 11.5–15.5)
WBC: 8.6 10*3/uL (ref 4.0–10.5)
WBC: 9.6 10*3/uL (ref 4.0–10.5)

## 2014-05-29 LAB — LACTIC ACID, PLASMA: LACTIC ACID, VENOUS: 0.7 mmol/L (ref 0.5–2.2)

## 2014-05-29 LAB — BASIC METABOLIC PANEL
Anion gap: 12 (ref 5–15)
BUN: 52 mg/dL — AB (ref 6–23)
CO2: 21 meq/L (ref 19–32)
Calcium: 7.7 mg/dL — ABNORMAL LOW (ref 8.4–10.5)
Chloride: 104 mEq/L (ref 96–112)
Creatinine, Ser: 2.63 mg/dL — ABNORMAL HIGH (ref 0.50–1.10)
GFR calc Af Amer: 19 mL/min — ABNORMAL LOW (ref >90)
GFR calc non Af Amer: 16 mL/min — ABNORMAL LOW (ref >90)
Glucose, Bld: 134 mg/dL — ABNORMAL HIGH (ref 70–99)
Potassium: 5 mEq/L (ref 3.7–5.3)
SODIUM: 137 meq/L (ref 137–147)

## 2014-05-29 LAB — PREPARE PLATELET PHERESIS: UNIT DIVISION: 0

## 2014-05-29 LAB — PREPARE RBC (CROSSMATCH)

## 2014-05-29 LAB — PROCALCITONIN: Procalcitonin: 0.71 ng/mL

## 2014-05-29 MED ORDER — SODIUM CHLORIDE 0.9 % IV SOLN
Freq: Once | INTRAVENOUS | Status: AC
  Administered 2014-05-29: 02:00:00 via INTRAVENOUS

## 2014-05-29 MED ORDER — SODIUM CHLORIDE 0.9 % IJ SOLN
10.0000 mL | INTRAMUSCULAR | Status: DC | PRN
  Administered 2014-06-03: 30 mL
  Administered 2014-06-03: 40 mL
  Administered 2014-06-05 – 2014-06-06 (×2): 30 mL
  Filled 2014-05-29 (×4): qty 40

## 2014-05-29 MED ORDER — SODIUM CHLORIDE 0.9 % IV SOLN
1000.0000 mL | INTRAVENOUS | Status: DC
  Administered 2014-05-29: 1000 mL via INTRAVENOUS
  Administered 2014-06-01: 500 mL via INTRAVENOUS

## 2014-05-29 MED ORDER — SODIUM CHLORIDE 0.9 % IJ SOLN
10.0000 mL | Freq: Two times a day (BID) | INTRAMUSCULAR | Status: DC
  Administered 2014-05-29: 30 mL
  Administered 2014-05-29 – 2014-06-05 (×14): 10 mL

## 2014-05-29 MED ORDER — NOREPINEPHRINE BITARTRATE 1 MG/ML IV SOLN
0.0000 ug/min | INTRAVENOUS | Status: DC
  Administered 2014-05-29: 6 ug/min via INTRAVENOUS
  Administered 2014-05-29: 10 ug/min via INTRAVENOUS
  Administered 2014-05-29: 8 ug/min via INTRAVENOUS
  Administered 2014-05-30: 6 ug/min via INTRAVENOUS
  Filled 2014-05-29 (×5): qty 4

## 2014-05-29 NOTE — Progress Notes (Addendum)
PULMONARY / CRITICAL CARE MEDICINE   Name: Brittney Thomas MRN: [id number] DOB: 1933-04-08    ADMISSION DATE:  05/27/2014   REFERRING MD :  EDP  CHIEF COMPLAINT: Dizziness  INITIAL PRESENTATION: Shock/dizziness  STUDIES:  11/27 ct abd/pelvis>> large rectus sheath hematoma 11/28 repeat Ct abd >> increasing size rectus sheath hematoma, extending now into retroperitoneal space  SIGNIFICANT EVENTS: Presented to ED 11/27 shock  SUBJECTIVE:  Has remained on phenylephrine, note Hgb drop overnight received PRBC x 1 Am Hgb still pending this am  VITAL SIGNS: Temp:  [97.5 F (36.4 C)-97.8 F (36.6 C)] 97.8 F (36.6 C) (11/29 0500) Pulse Rate:  [43-123] 75 (11/29 0700) Resp:  [12-24] 17 (11/29 0700) BP: (80-127)/(29-55) 84/44 mmHg (11/29 0700) SpO2:  [87 %-100 %] 99 % (11/29 0700) HEMODYNAMICS:   VENTILATOR SETTINGS:   INTAKE / OUTPUT:  Intake/Output Summary (Last 24 hours) at 05/29/14 0901 Last data filed at 05/29/14 0800  Gross per 24 hour  Intake 4479.48 ml  Output    620 ml  Net 3859.48 ml    PHYSICAL EXAMINATION: General:  Frail ewm Neuro:  Follows commands HEENT:  +jvd/-LAN Cardiovascular:  HSIR IR Lungs: Decreased in bases Abdomen:  Tense, decreased bs, palpable sore hematoma L-mid abd Musculoskeletal:  Lower ext chronic vascular changes Skin:  Cool, ++ lower ext edma  LABS:  CBC  Recent Labs Lab 05/28/14 0640 05/28/14 1810 05/28/14 2345  WBC 7.1 7.6 8.4  HGB 8.2* 7.6* 7.0*  HCT 24.9* 23.6* 21.0*  PLT 68* 68* 108*   Coag's  Recent Labs Lab 05/27/14 1809  APTT 43*  INR 1.12   BMET  Recent Labs Lab 05/28/14 0640 05/28/14 2345 05/29/14 0745  NA 130* 137 133*  K 4.6 5.0 4.8  CL 98 104 100  CO2 22 21 21   BUN 54* 52* 52*  CREATININE 3.05* 2.63* 2.60*  GLUCOSE 121* 134* 126*   Electrolytes  Recent Labs Lab 05/27/14 1809 05/28/14 0640 05/28/14 2345 05/29/14 0745  CALCIUM 8.0* 7.7* 7.7* 7.4*  MG 1.9  --   --   --   PHOS 3.3  --    --   --    Sepsis Markers  Recent Labs Lab 05/27/14 1113 05/27/14 1809 05/28/14 0640 05/28/14 2345  LATICACIDVEN 1.73  --   --  0.7  PROCALCITON  --  1.23 0.98 0.71   ABG  Recent Labs Lab 05/27/14 1530  PHART 7.273*  PCO2ART 47.5*  PO2ART 75.6*   Liver Enzymes  Recent Labs Lab 05/27/14 1117 05/27/14 1809 05/29/14 0745  AST 315* 249* 57*  ALT 383* 359* 169*  ALKPHOS 86 95 86  BILITOT 0.5 0.6 0.8  ALBUMIN 3.0* 3.1* 2.7*   Cardiac Enzymes  Recent Labs Lab 05/27/14 1117  TROPONINI <0.30   Glucose No results for input(s): GLUCAP in the last 168 hours.  Imaging Dg Chest Port 1 View  05/28/2014   CLINICAL DATA:  Status post central line placement. Initial encounter.  EXAM: PORTABLE CHEST - 1 VIEW  COMPARISON:  Chest radiograph performed 05/27/2014  FINDINGS: The patient's left subclavian line is noted ending about the mid SVC.  The lungs are mildly hypoexpanded. Mild vascular crowding and vascular congestion are seen. No definite pleural effusion or pneumothorax identified.  The cardiomediastinal silhouette is enlarged. No acute osseous abnormalities are identified.  IMPRESSION: 1. Left subclavian line noted ending about the mid SVC. 2. Lungs mildly hypoexpanded, with mild vascular congestion and cardiomegaly. Lungs remain grossly clear.  Electronically Signed   By: Garald Balding M.D.   On: 05/28/2014 23:29     ASSESSMENT / PLAN:  PULMONARY OETT A: No acute issues Metastatic adenocarcinoma of lung, never smoker;   P:   EGRF status? Will need to review [facility] data with Dr Julien Nordmann.   CARDIOVASCULAR L [place] CVL 11/28 >>   A:  Hypotension from blood loss anemia, hemorrhagic shock, remains on phenylephrine HTN DVT hx of Chronic anticoagulation CAF P:  Hold all antihypertensives D/c IVF and replace volume with PRBC Transfuse as indicated Hold anticoagulants  Continue phenylephrine  RENAL Lab Results  Component Value Date   CREATININE 2.60*  05/29/2014   CREATININE 2.63* 05/28/2014   CREATININE 3.05* 05/28/2014   CREATININE 1.1 05/24/2014   CREATININE 1.1 05/16/2014   CREATININE 1.4* 05/10/2014   A:   New onset renal failure > presumably due to anemia/hypotension, stabilizing S Cr at 2.6 P:   IV hydration  GASTROINTESTINAL A:   Rectus sheath/ retroperitoneal bleed  P:   PPI  HEMATOLOGIC A:  Blood loss anemia > retroperitoneal bleed Supratherapeutic lovenox dosing in setting of renal failure Superficial thrombus LLE P:   Serial cbc Hold anticoagulants  Hgb transfusion goal Hgb > 8.0 in setting active blood loss Defer IVC filter in absence DVT (only superficial LLE clot)  INFECTIOUS A:   No clear infectious source P:   - empiric zosyn started 11/28 pm, low threshold to d/c next 24h as we likely have an explanation for her shock state  ENDOCRINE A:    Thyroid disease P:   IV synthroid at half dose  NEUROLOGIC A:  No acute issue P:   Hold all sedatives   FAMILY  - Updates: Daughter and patient updated at bedside by Dr Lamonte Sakai 11/29  Independent CC time 35 minutes  Baltazar Apo, MD, PhD 05/29/2014, 9:01 AM [facility] [phone] or if no answer [phone]

## 2014-05-30 ENCOUNTER — Encounter (HOSPITAL_COMMUNITY): Payer: Self-pay | Admitting: Radiology

## 2014-05-30 ENCOUNTER — Inpatient Hospital Stay (HOSPITAL_COMMUNITY): Payer: Medicare Other

## 2014-05-30 ENCOUNTER — Ambulatory Visit: Payer: Medicare Other | Admitting: Physician Assistant

## 2014-05-30 ENCOUNTER — Other Ambulatory Visit: Payer: Medicare Other

## 2014-05-30 DIAGNOSIS — C801 Malignant (primary) neoplasm, unspecified: Secondary | ICD-10-CM

## 2014-05-30 DIAGNOSIS — R918 Other nonspecific abnormal finding of lung field: Secondary | ICD-10-CM

## 2014-05-30 DIAGNOSIS — I369 Nonrheumatic tricuspid valve disorder, unspecified: Secondary | ICD-10-CM

## 2014-05-30 LAB — CBC
HEMATOCRIT: 25.6 % — AB (ref 36.0–46.0)
HEMATOCRIT: 24.4 % — AB (ref 36.0–46.0)
Hemoglobin: 8.6 g/dL — ABNORMAL LOW (ref 12.0–15.0)
Hemoglobin: 8.3 g/dL — ABNORMAL LOW (ref 12.0–15.0)
MCH: 30.8 pg (ref 26.0–34.0)
MCH: 31.1 pg (ref 26.0–34.0)
MCHC: 33.6 g/dL (ref 30.0–36.0)
MCHC: 34 g/dL (ref 30.0–36.0)
MCV: 91.8 fL (ref 78.0–100.0)
MCV: 91.4 fL (ref 78.0–100.0)
Platelets: 108 10*3/uL — ABNORMAL LOW (ref 150–400)
Platelets: 113 10*3/uL — ABNORMAL LOW (ref 150–400)
RBC: 2.79 MIL/uL — ABNORMAL LOW (ref 3.87–5.11)
RBC: 2.67 MIL/uL — ABNORMAL LOW (ref 3.87–5.11)
RDW: 16.2 % — AB (ref 11.5–15.5)
RDW: 16 % — AB (ref 11.5–15.5)
WBC: 9.5 10*3/uL (ref 4.0–10.5)
WBC: 7.8 10*3/uL (ref 4.0–10.5)

## 2014-05-30 LAB — TYPE AND SCREEN
ABO/RH(D): O POS
ANTIBODY SCREEN: NEGATIVE
UNIT DIVISION: 0
UNIT DIVISION: 0
UNIT DIVISION: 0
Unit division: 0
Unit division: 0

## 2014-05-30 LAB — BASIC METABOLIC PANEL
Anion gap: 11 (ref 5–15)
BUN: 51 mg/dL — ABNORMAL HIGH (ref 6–23)
CALCIUM: 7.9 mg/dL — AB (ref 8.4–10.5)
CHLORIDE: 98 meq/L (ref 96–112)
CO2: 21 mEq/L (ref 19–32)
CREATININE: 2.35 mg/dL — AB (ref 0.50–1.10)
GFR calc Af Amer: 22 mL/min — ABNORMAL LOW (ref >90)
GFR calc non Af Amer: 19 mL/min — ABNORMAL LOW (ref >90)
GLUCOSE: 136 mg/dL — AB (ref 70–99)
Potassium: 4.8 mEq/L (ref 3.7–5.3)
Sodium: 130 mEq/L — ABNORMAL LOW (ref 137–147)

## 2014-05-30 LAB — CORTISOL: Cortisol, Plasma: 30.6 ug/dL

## 2014-05-30 MED ORDER — ENSURE COMPLETE PO LIQD: 237.0000 mL | Freq: Two times a day (BID) | ORAL | Status: DC | PRN

## 2014-05-30 MED ORDER — HEPARIN SODIUM (PORCINE) 5000 UNIT/ML IJ SOLN
5000.0000 [IU] | Freq: Three times a day (TID) | INTRAMUSCULAR | Status: DC
  Administered 2014-05-30 – 2014-06-06 (×21): 5000 [IU] via SUBCUTANEOUS
  Filled 2014-05-30 (×24): qty 1

## 2014-05-30 NOTE — Plan of Care (Signed)
Problem: Phase I Progression Outcomes Goal: Voiding-avoid urinary catheter unless indicated Outcome: Completed/Met Date Met:  05/30/14     

## 2014-05-30 NOTE — Progress Notes (Signed)
INITIAL NUTRITION ASSESSMENT  DOCUMENTATION CODES Per approved criteria  -Not Applicable   INTERVENTION: -Recommend Ensure Complete po BID PRN, each supplement provides 350 kcal and 13 grams of protein -Family to bring in pt's dentures -RD to continue to monitor   NUTRITION DIAGNOSIS: Increased nutrient needs (protein) related to chronic disease/wound healing as evidenced by lung cancer, stg 2 pressure ulcer on buttock.   Goal: Pt to meet >/= 90% of their estimated nutrition needs    Monitor:  Total protein/energy intake, labs, weights  Reason for Assessment: MST  80 y.o. female  Admitting Dx: Hemorrhagic shock  ASSESSMENT: 80 yo WF dx with adenocarcinoma of lung via FOB in 10/15. She is on LMWH injections for hx CAF/DVT and was seen in Oncology clinic 11/25 and was given Granix injection for neutropenia. Family reports steady decline with dizziness and lethargy. She [facility]/27 and was found to have hemoglobin of 5.2 and exam reveals findings consistent with rectal sheath hematoma  -Pt lethargic during time of assessment. Denied any changes in appetite or weight pta. Denied use of nutrition supplementation -Previous medical records indicate pt with approximate 8 lb weight loss in one month (5% body weight loss, significant for time frame) -Per discussion with RN, pt consumed <50% of applesauce. Tolerating soft foods d/t poor dentition. Daughter plans on coming in later to visit pt and provide her with dentures; which will likely assist in PO intake -RN plans to assist pt order soft foods -Discussed addition of Ensure PRN if pt continues to have decreased PO intake. RN in agreement -Current weight likely elevated d/t bilateral edema in lower extremities -  Height: Ht Readings from Last 1 Encounters:  05/27/14 5\' 8"  (1.727 m)    Weight: Wt Readings from Last 1 Encounters:  05/30/14 188 lb 4.4 oz (85.4 kg)    Ideal Body Weight: 140 lb  % Ideal Body  Weight: 108% (11/16 wt used)  Wt Readings from Last 10 Encounters:  05/30/14 188 lb 4.4 oz (85.4 kg)  05/16/14 152 lb 1.6 oz (68.992 kg)  05/13/14 154 lb (69.854 kg)  05/03/14 154 lb 11.2 oz (70.171 kg)  04/29/14 161 lb 12.8 oz (73.392 kg)  04/20/14 160 lb 11.2 oz (72.893 kg)  04/19/14 160 lb 11.2 oz (72.893 kg)  04/15/14 163 lb (73.936 kg)  08/25/13 165 lb 8 oz (75.07 kg)  06/23/13 159 lb (72.122 kg)    Usual Body Weight: approximately 160 lb per previous med records  % Usual Body Weight: 95%  BMI:  Body mass index is 28.63 kg/(m^2).  Estimated Nutritional Needs: Kcal: 1700-1900 Protein: 85-95 gram Fluid: >/= 1700 ml daily  Skin: stg 2 pressure ulcer on buttock  Diet Order: Diet regular  EDUCATION NEEDS: -Education not appropriate at this time   Intake/Output Summary (Last 24 hours) at 05/30/14 1217 Last data filed at 05/30/14 1200  Gross per 24 hour  Intake 2608.77 ml  Output   1495 ml  Net 1113.77 ml    Last BM: 11/20- abd distended   Labs:   Recent Labs Lab 05/27/14 1809  05/28/14 2345 05/29/14 0745 05/30/14 0505  NA 135*  < > 137 133* 130*  K 4.5  < > 5.0 4.8 4.8  CL 102  < > 104 100 98  CO2 21  < > 21 21 21   BUN 57*  < > 52* 52* 51*  CREATININE 3.67*  < > 2.63* 2.60* 2.35*  CALCIUM 8.0*  < > 7.7* 7.4*  7.9*  MG 1.9  --   --   --   --   PHOS 3.3  --   --   --   --   GLUCOSE 120*  < > 134* 126* 136*  < > = values in this interval not displayed.  CBG (last 3)  No results for input(s): GLUCAP in the last 72 hours.  Scheduled Meds: . chlorhexidine  15 mL Mouth Rinse BID  . Chlorhexidine Gluconate Cloth  6 each Topical Q0600  . heparin subcutaneous  5,000 Units Subcutaneous 3 times per day  . levothyroxine  44 mcg Intravenous Daily  . mupirocin ointment  1 application Nasal BID  . sodium chloride  10-40 mL Intracatheter Q12H    Continuous Infusions: . sodium chloride 1,000 mL (05/29/14 1035)  . norepinephrine (LEVOPHED) Adult infusion 1  mcg/min (05/30/14 1145)  . phenylephrine (NEO-SYNEPHRINE) Adult infusion 200 mcg/min (05/30/14 1100)    Past Medical History  Diagnosis Date  . Ulcer 06-08-13    Left medial ankle  . Hypertension   . Thyroid disease     Hypo-Thyroidism  . Esophageal reflux   . COPD (chronic obstructive pulmonary disease)   . Peripheral vascular disease   . Hypothyroidism   . Insomnia     takes lorazepam  . Arthritis   . Dysrhythmia   . DVT (deep venous thrombosis) 2008  . Paroxysmal a-fib 2008  . PE (pulmonary thromboembolism) 2008  . Wears dentures     Past Surgical History  Procedure Laterality Date  . Tonsillectomy    . Eye surgery Right Aug. 2014    Cataract  . Eye surgery Left Oct. 2014    Cataract  . Abdominal hysterectomy  1984    Total  . Video bronchoscopy with endobronchial navigation N/A 04/20/2014    Procedure: VIDEO BRONCHOSCOPY WITH ENDOBRONCHIAL NAVIGATION;  Surgeon: Collene Gobble, MD;  Location: [place];  Service: Thoracic;  Laterality: N/A;  . Video bronchoscopy with endobronchial ultrasound N/A 04/20/2014    Procedure: VIDEO BRONCHOSCOPY WITH ENDOBRONCHIAL ULTRASOUND;  Surgeon: Collene Gobble, MD;  Location: [place];  Service: Thoracic;  Laterality: N/A;    Atlee Abide MS RD LDN Clinical Dietitian [phone]

## 2014-05-30 NOTE — Plan of Care (Signed)
Problem: Phase I Progression Outcomes Goal: Pain controlled with appropriate interventions Outcome: Completed/Met Date Met:  05/30/14     

## 2014-05-30 NOTE — Progress Notes (Signed)
Echocardiogram 2D Echocardiogram has been performed.  Joelene Millin 05/30/2014, 2:00 PM

## 2014-05-30 NOTE — Progress Notes (Signed)
PULMONARY / CRITICAL CARE MEDICINE   Name: Brittney Thomas MRN: [id number] DOB: October 09, 1932    ADMISSION DATE:  05/27/2014   REFERRING MD :  EDP  CHIEF COMPLAINT: Dizziness  INITIAL PRESENTATION: Shock/dizziness  STUDIES:  11/27 ct abd/pelvis>> large rectus sheath hematoma 11/28 repeat Ct abd >> increasing size rectus sheath hematoma, extending now into retroperitoneal space  SIGNIFICANT EVENTS: Presented to ED 11/27 shock  SUBJECTIVE:  NO complaints this morning> no pain, no dyspnea, still on two pressors  VITAL SIGNS: Temp:  [97.5 F (36.4 C)-98.3 F (36.8 C)] 98.1 F (36.7 C) (11/30 0800) Pulse Rate:  [31-116] 90 (11/30 0900) Resp:  [12-20] 15 (11/30 0900) BP: (105-144)/(35-94) 121/68 mmHg (11/30 0900) SpO2:  [95 %-100 %] 100 % (11/30 0900) Weight:  [83.2 kg (183 lb 6.8 oz)-85.4 kg (188 lb 4.4 oz)] 85.4 kg (188 lb 4.4 oz) (11/30 0300) HEMODYNAMICS:   VENTILATOR SETTINGS:   INTAKE / OUTPUT:  Intake/Output Summary (Last 24 hours) at 05/30/14 1042 Last data filed at 05/30/14 0900  Gross per 24 hour  Intake 2678.7 ml  Output   1495 ml  Net 1183.7 ml    PHYSICAL EXAMINATION:  Gen: comfortable in bed HEENT: NCAT, OP clear PULM: CTA B CV: Irreg irreg, no mgr Ab: BS+, nontender mass in subcutaneous tissue left lower quadrant Ext: warm, edema in legs bilaterally Neuro: A&Ox4, maew  LABS:  CBC  Recent Labs Lab 05/29/14 0745 05/29/14 1810 05/30/14 0505  WBC 8.6 9.6 9.5  HGB 7.5* 8.7* 8.6*  HCT 22.6* 26.3* 25.6*  PLT 117* 111* 108*   Coag's  Recent Labs Lab 05/27/14 1809  APTT 43*  INR 1.12   BMET  Recent Labs Lab 05/28/14 2345 05/29/14 0745 05/30/14 0505  NA 137 133* 130*  K 5.0 4.8 4.8  CL 104 100 98  CO2 21 21 21   BUN 52* 52* 51*  CREATININE 2.63* 2.60* 2.35*  GLUCOSE 134* 126* 136*   Electrolytes  Recent Labs Lab 05/27/14 1809  05/28/14 2345 05/29/14 0745 05/30/14 0505  CALCIUM 8.0*  < > 7.7* 7.4* 7.9*  MG 1.9  --   --    --   --   PHOS 3.3  --   --   --   --   < > = values in this interval not displayed. Sepsis Markers  Recent Labs Lab 05/27/14 1113 05/27/14 1809 05/28/14 0640 05/28/14 2345  LATICACIDVEN 1.73  --   --  0.7  PROCALCITON  --  1.23 0.98 0.71   ABG  Recent Labs Lab 05/27/14 1530  PHART 7.273*  PCO2ART 47.5*  PO2ART 75.6*   Liver Enzymes  Recent Labs Lab 05/27/14 1117 05/27/14 1809 05/29/14 0745  AST 315* 249* 57*  ALT 383* 359* 169*  ALKPHOS 86 95 86  BILITOT 0.5 0.6 0.8  ALBUMIN 3.0* 3.1* 2.7*   Cardiac Enzymes  Recent Labs Lab 05/27/14 1117  TROPONINI <0.30   Glucose No results for input(s): GLUCAP in the last 168 hours.  Imaging Ct Abdomen Pelvis Wo Contrast  05/29/2014   CLINICAL DATA:  Follow-up known rectus sheath and pelvic hematoma. Subsequent encounter. Hemorrhagic shock.  EXAM: CT ABDOMEN AND PELVIS WITHOUT CONTRAST  TECHNIQUE: Multidetector CT imaging of the abdomen and pelvis was performed following the standard protocol without IV contrast.  COMPARISON:  CT of the abdomen and pelvis performed 05/27/2014  FINDINGS: Small bilateral pleural effusions are seen, new from the prior study. This is likely reactive secondary to the blood described  below, under the diaphragm. Associated mild atelectasis is seen.  The patient's large rectus sheath/pelvic hematoma has increased mildly in size, now measuring approximately 20.7 x 6.4 x 8.6 cm along the rectus sheath, and 17.8 x 8.6 x 13.9 cm within the left hemipelvis. There is new leakage of the hematoma into the retroperitoneal space posterior to the left kidney, with blood now seen tracking about the liver and spleen. A small amount of blood is also seen tracking about the right kidney and right paracolic gutter.  Scattered vague hypodensities are again noted within the liver, measuring up to 4.3 cm in size. As before, these raise concern for malignancy, given the patient's history of malignancy. Some of these may  reflect cysts. The spleen is unremarkable in appearance. A small stone is noted within the gallbladder. The gallbladder is otherwise grossly unremarkable, though difficult to fully assess given adjacent blood. The pancreas and adrenal glands are grossly unremarkable in appearance, though the adrenal glands are not well assessed.  There is minimal left-sided hydronephrosis. This appears to reflect mass effect from the large pelvic hematoma, on the left ureter. There is anterior displacement of the left kidney from retroperitoneal hematoma. A few small renal cysts are noted, and a parenchymal calcification is seen near the upper pole of the left kidney. No renal or ureteral stones are identified.  No free fluid is identified. The small bowel is unremarkable in appearance. The stomach is within normal limits. No acute vascular abnormalities are seen. Scattered calcification is seen along the abdominal aorta and its branches.  The appendix is not definitely seen; there is no evidence for appendicitis. Mild diverticulosis is noted along the sigmoid colon, without evidence of diverticulitis. The sigmoid colon is displaced by the pelvic hematoma.  The bladder is relatively decompressed and not well assessed. Soft tissue swelling is noted along the left side of the pelvis, and lateral abdominal wall bilaterally. No inguinal lymphadenopathy is seen.  No acute osseous abnormalities are identified. Mild diffuse degenerative change is noted along the lumbar spine.  IMPRESSION: 1. Large rectus sheath/pelvic hematoma has increased mildly in size, now measuring approximately 20.7 x 6.4 x 8.6 cm along the rectus sheath, and 17.8 x 8.6 x 13.9 cm at the left hemipelvis. New leakage of hematoma into the retroperitoneal space posterior to the left kidney, with blood now seen tracking about the liver and spleen. Small amount of blood also noted tracking about the right kidney and right paracolic gutter. 2. New small bilateral pleural  effusions are likely reactive secondary to the blood under the diaphragm. 3. Minimal left-sided hydronephrosis appears to reflect mass effect from the large pelvic hematoma, on the left ureter. 4. Soft tissue swelling along the left side of the pelvis, and lateral abdominal wall bilaterally. 5. Vague hypodensities again noted within the liver, measuring up to 4.3 cm. As before, these raise concern for malignancy, given the patient's history of malignancy. Some of these may reflect cysts, given their attenuation. 6. Cholelithiasis noted; gallbladder otherwise grossly unremarkable, though difficult to fully assess given adjacent blood. 7. Few small renal cysts noted. 8. Scattered calcification along the abdominal aorta and its branches. 9. Mild diverticulosis along the sigmoid colon, without evidence of diverticulitis. 10. Mild bilateral atelectasis noted.  These results were called by telephone at the time of interpretation on 05/29/2014 at 1:39 am to Nursing in the [facility], who verbally acknowledged these results.   Electronically Signed   By: Francoise Schaumann.D.  On: 05/29/2014 01:52   Dg Chest Port 1 View  05/30/2014   CLINICAL DATA:  Acute onset of shortness of breath. Subsequent encounter.  EXAM: PORTABLE CHEST - 1 VIEW  COMPARISON:  Chest radiograph performed 05/28/2014  FINDINGS: The patient's left subclavian line is noted ending about the mid SVC.  The lungs are well-aerated. Vascular congestion is noted. Mildly increased interstitial markings could reflect minimal interstitial edema. There is no evidence of pleural effusion or pneumothorax.  The cardiomediastinal silhouette is mildly enlarged. No acute osseous abnormalities are seen.  IMPRESSION: Vascular congestion and mild cardiomegaly noted. Mildly increased interstitial markings could reflect minimal interstitial edema.   Electronically Signed   By: Garald Balding M.D.   On: 05/30/2014 00:19     ASSESSMENT /  PLAN:  PULMONARY OETT A: No acute issues Metastatic adenocarcinoma of lung, never smoker;   P:   O2 as needed for O2 > 92%  CARDIOVASCULAR L [place] CVL 11/28 >>   A:  Hypotension 64/40 source uncertain> not actively bleeding at this point; adrenal insufficiency? Cardiogenic? HTN Chronic anticoagulation Chronic AFib P:  Hold all antihypertensives Transfuse for Hgb < 8gm/dL Hold anticoagulants  Continue phenylephrine for MAP > 55 or SBP >90, d/c levophed due to afib with rvr Check cortisol Check echo, svo2  RENAL A:   New onset renal failure > presumably due to anemia/hypotension, improving P:   Monitor BMET and UOP Replace electrolytes as needed   GASTROINTESTINAL A:   No acute issues P:   Advance diet  HEMATOLOGIC A:  Blood loss anemia > retroperitoneal bleed, rectus sheath bleed Supratherapeutic lovenox dosing in setting of renal failure Superficial thrombus LLE P:   Serial cbc Hold anticoagulants  Hgb transfusion goal Hgb > 8.0 in setting active blood loss Hold IVC filter in absence DVT (only superficial LLE clot) Cautiously add back sub q heparin today for high risk DVT (malignancy, superficial clot)  INFECTIOUS A:   No clear infectious source P:   -stop zosyn  ENDOCRINE A:    Thyroid disease P:   IV synthroid at half dose  NEUROLOGIC A:  Confusion> her affect is flat, neuro exam non-focal; RN concerned for change in mental status No brain mets on 11/20 MRI brain P:   Hold all sedatives Check CT head given confusion   FAMILY  - Updates: Daughter and patient updated at bedside by Dr Lamonte Sakai 11/29  Independent CC time 35 minutes  Roselie Awkward, MD [facility] [facility] Pager: [phone] Cell: [phone] If no response, call [phone]

## 2014-05-30 NOTE — Care Management Note (Signed)
    Page 1 of 1   05/30/2014     11:25:23 AM CARE MANAGEMENT NOTE 05/30/2014  Patient:  Brittney Thomas, Brittney Thomas   Account Number:  [id number]  Date Initiated:  05/30/2014  Documentation initiated by:  DAVIS,RHONDA  Subjective/Objective Assessment:   80 yo WF dx adenocarcinoma of lung via FOB in 10/15. She is on LMWH injections for hx CAF/DVT & was seen in Oncology clinic 11/25 and was given   Granix injection for neutropenia. Family reports steady decline with dizziness and lethgary.     Action/Plan:   tbd   Anticipated DC Date:  06/02/2014   Anticipated DC Plan:  HOME/SELF CARE         Choice offered to / List presented to:             Status of service:  In process, will continue to follow Medicare Important Message given?   (If response is "NO", the following Medicare IM given date fields will be blank) Date Medicare IM given:   Medicare IM given by:   Date Additional Medicare IM given:   Additional Medicare IM given by:    Discharge Disposition:    Per UR Regulation:  Reviewed for med. necessity/level of care/duration of stay  If discussed at [place] of Stay Meetings, dates discussed:    Comments:  [id number]/Rhonda Rosana Hoes, RN, BSN, CCM Chart reviewed. Discharge needs and patient's stay to be reviewed and followed by case manager.

## 2014-05-31 ENCOUNTER — Inpatient Hospital Stay (HOSPITAL_COMMUNITY): Payer: Medicare Other

## 2014-05-31 ENCOUNTER — Ambulatory Visit: Payer: Medicare Other

## 2014-05-31 ENCOUNTER — Other Ambulatory Visit: Payer: Medicare Other

## 2014-05-31 DIAGNOSIS — D6852 Prothrombin gene mutation: Secondary | ICD-10-CM

## 2014-05-31 LAB — CBC
HCT: 22.3 % — ABNORMAL LOW (ref 36.0–46.0)
HCT: 21.7 % — ABNORMAL LOW (ref 36.0–46.0)
HEMOGLOBIN: 7.6 g/dL — AB (ref 12.0–15.0)
Hemoglobin: 7.3 g/dL — ABNORMAL LOW (ref 12.0–15.0)
MCH: 30.9 pg (ref 26.0–34.0)
MCH: 30.8 pg (ref 26.0–34.0)
MCHC: 34.1 g/dL (ref 30.0–36.0)
MCHC: 33.6 g/dL (ref 30.0–36.0)
MCV: 90.7 fL (ref 78.0–100.0)
MCV: 91.6 fL (ref 78.0–100.0)
PLATELETS: 140 10*3/uL — AB (ref 150–400)
Platelets: 128 10*3/uL — ABNORMAL LOW (ref 150–400)
RBC: 2.46 MIL/uL — ABNORMAL LOW (ref 3.87–5.11)
RBC: 2.37 MIL/uL — ABNORMAL LOW (ref 3.87–5.11)
RDW: 15.6 % — AB (ref 11.5–15.5)
RDW: 15.7 % — AB (ref 11.5–15.5)
WBC: 8.2 10*3/uL (ref 4.0–10.5)
WBC: 8 10*3/uL (ref 4.0–10.5)

## 2014-05-31 LAB — BASIC METABOLIC PANEL
Anion gap: 10 (ref 5–15)
Anion gap: 11 (ref 5–15)
BUN: 39 mg/dL — AB (ref 6–23)
BUN: 34 mg/dL — AB (ref 6–23)
CALCIUM: 7.3 mg/dL — AB (ref 8.4–10.5)
CHLORIDE: 99 meq/L (ref 96–112)
CO2: 22 mEq/L (ref 19–32)
CO2: 22 mEq/L (ref 19–32)
Calcium: 8.2 mg/dL — ABNORMAL LOW (ref 8.4–10.5)
Chloride: 106 mEq/L (ref 96–112)
Creatinine, Ser: 1.44 mg/dL — ABNORMAL HIGH (ref 0.50–1.10)
Creatinine, Ser: 1.15 mg/dL — ABNORMAL HIGH (ref 0.50–1.10)
GFR calc non Af Amer: 34 mL/min — ABNORMAL LOW (ref >90)
GFR, EST AFRICAN AMERICAN: 39 mL/min — AB (ref >90)
GFR, EST AFRICAN AMERICAN: 51 mL/min — AB (ref >90)
GFR, EST NON AFRICAN AMERICAN: 44 mL/min — AB (ref >90)
GLUCOSE: 130 mg/dL — AB (ref 70–99)
Glucose, Bld: 103 mg/dL — ABNORMAL HIGH (ref 70–99)
Potassium: 5.7 mEq/L — ABNORMAL HIGH (ref 3.7–5.3)
Potassium: 5.5 mEq/L — ABNORMAL HIGH (ref 3.7–5.3)
Sodium: 131 mEq/L — ABNORMAL LOW (ref 137–147)
Sodium: 139 mEq/L (ref 137–147)

## 2014-05-31 LAB — CARBOXYHEMOGLOBIN
Carboxyhemoglobin: 2.6 % — ABNORMAL HIGH (ref 0.5–1.5)
Methemoglobin: 2.2 % — ABNORMAL HIGH (ref 0.0–1.5)
O2 Saturation: 65.7 %
Total hemoglobin: 7.5 g/dL — ABNORMAL LOW (ref 12.0–16.0)

## 2014-05-31 LAB — LEGIONELLA ANTIGEN, URINE

## 2014-05-31 MED ORDER — SODIUM POLYSTYRENE SULFONATE 15 GM/60ML PO SUSP
30.0000 g | Freq: Once | ORAL | Status: AC
  Administered 2014-05-31: 30 g via ORAL
  Filled 2014-05-31: qty 120

## 2014-05-31 MED ORDER — ENSURE COMPLETE PO LIQD
237.0000 mL | Freq: Two times a day (BID) | ORAL | Status: DC
  Administered 2014-05-31 – 2014-06-05 (×8): 237 mL via ORAL

## 2014-05-31 MED ORDER — TECHNETIUM TC 99M DIETHYLENETRIAME-PENTAACETIC ACID: 36.1000 | Freq: Once | INTRAVENOUS | Status: AC | PRN

## 2014-05-31 MED ORDER — TECHNETIUM TO 99M ALBUMIN AGGREGATED
5.1000 | Freq: Once | INTRAVENOUS | Status: AC | PRN
  Administered 2014-05-31: 5 via INTRAVENOUS

## 2014-05-31 NOTE — Progress Notes (Signed)
PULMONARY / CRITICAL CARE MEDICINE   Name: OSA FOGARTY MRN: [id number] DOB: 03-30-1933    ADMISSION DATE:  05/27/2014   REFERRING MD :  EDP  CHIEF COMPLAINT: Dizziness  INITIAL PRESENTATION: Shock/dizziness  STUDIES:  11/27 ct abd/pelvis>> large rectus sheath hematoma 11/28 repeat Ct abd >> increasing size rectus sheath hematoma, extending now into retroperitoneal space 11/30 Echo> LVEF 60-65%, mild MR, mild mitral calcification, LAE mod-severe dilated, RA severely dilated, mod-severe TR, PA systolic estimated 51VOHY 11/30 CT head >> NAICP  SIGNIFICANT EVENTS: Presented to ED 11/27 shock 11/30 improved hemodynamics, renal function, still on pressors  SUBJECTIVE:  Still on low dose neosynephrine, echo with elevated PA pressure, hgb relatively stable  VITAL SIGNS: Temp:  [98 F (36.7 C)-98.9 F (37.2 C)] 98.7 F (37.1 C) (12/01 0800) Pulse Rate:  [26-124] 73 (12/01 0800) Resp:  [15-23] 21 (12/01 0800) BP: (83-139)/(37-72) 115/57 mmHg (12/01 0800) SpO2:  [92 %-100 %] 99 % (12/01 0800) Weight:  [193 lb 5.5 oz (87.7 kg)] 193 lb 5.5 oz (87.7 kg) (12/01 0500) HEMODYNAMICS:   VENTILATOR SETTINGS:   INTAKE / OUTPUT:  Intake/Output Summary (Last 24 hours) at 05/31/14 0940 Last data filed at 05/31/14 0800  Gross per 24 hour  Intake 1693.21 ml  Output   2305 ml  Net -611.79 ml    PHYSICAL EXAMINATION:  Gen: comfortable in bed HEENT: NCAT, OP clear PULM: CTA B CV: Irreg irreg, loud systolic murmur apex Ab: BS+, nontender mass in subcutaneous tissue left lower quadrant Ext: warm, edema in legs bilaterally Neuro: A&Ox4, maew  LABS:  CBC  Recent Labs Lab 05/30/14 0505 05/30/14 1800 05/31/14 0530  WBC 9.5 7.8 8.2  HGB 8.6* 8.3* 7.6*  HCT 25.6* 24.4* 22.3*  PLT 108* 113* 128*   Coag's  Recent Labs Lab 05/27/14 1809  APTT 43*  INR 1.12   BMET  Recent Labs Lab 05/29/14 0745 05/30/14 0505 05/31/14 0530  NA 133* 130* 131*  K 4.8 4.8 5.7*  CL 100  98 99  CO2 21 21 22   BUN 52* 51* 39*  CREATININE 2.60* 2.35* 1.44*  GLUCOSE 126* 136* 103*   Electrolytes  Recent Labs Lab 05/27/14 1809  05/29/14 0745 05/30/14 0505 05/31/14 0530  CALCIUM 8.0*  < > 7.4* 7.9* 7.3*  MG 1.9  --   --   --   --   PHOS 3.3  --   --   --   --   < > = values in this interval not displayed. Sepsis Markers  Recent Labs Lab 05/27/14 1113 05/27/14 1809 05/28/14 0640 05/28/14 2345  LATICACIDVEN 1.73  --   --  0.7  PROCALCITON  --  1.23 0.98 0.71   ABG  Recent Labs Lab 05/27/14 1530  PHART 7.273*  PCO2ART 47.5*  PO2ART 75.6*   Liver Enzymes  Recent Labs Lab 05/27/14 1117 05/27/14 1809 05/29/14 0745  AST 315* 249* 57*  ALT 383* 359* 169*  ALKPHOS 86 95 86  BILITOT 0.5 0.6 0.8  ALBUMIN 3.0* 3.1* 2.7*   Cardiac Enzymes  Recent Labs Lab 05/27/14 1117  TROPONINI <0.30   Glucose No results for input(s): GLUCAP in the last 168 hours.  Imaging Ct Head Wo Contrast  05/30/2014   CLINICAL DATA:  Acute encephalopathy  EXAM: CT HEAD WITHOUT CONTRAST  TECHNIQUE: Contiguous axial images were obtained from the base of the skull through the vertex without intravenous contrast.  COMPARISON:  MRI brain 05/20/2014  FINDINGS: There is no evidence of  mass effect, midline shift, or extra-axial fluid collections. There is no evidence of a space-occupying lesion or intracranial hemorrhage. There is no evidence of a cortical-based area of acute infarction. There is generalized cerebral atrophy. There is periventricular white matter low attenuation likely secondary to microangiopathy.  The ventricles and sulci are appropriate for the patient's age. The basal cisterns are patent.  Visualized portions of the orbits are unremarkable. The visualized portions of the paranasal sinuses and mastoid air cells are unremarkable. Cerebrovascular atherosclerotic calcifications are noted.  The osseous structures are unremarkable.  IMPRESSION: 1. No acute intracranial  pathology. 2. Chronic microvascular disease and cerebral atrophy.   Electronically Signed   By: Kathreen Devoid   On: 05/30/2014 14:51     ASSESSMENT / PLAN:  PULMONARY OETT N/A A: Elevated PA pressure on echo suggestive of pulmonary hypertension, source uncertain but worrisome for PE given malignancy and known history of clot; WHO group 1 or 2 or 3 pulmonary hypertension unlikely based on history Metastatic adenocarcinoma of lung, never smoker;   P:   O2 as needed for O2 > 92% V/Q scan today to look for PE  CARDIOVASCULAR L [place] CVL 11/28 >>   A:  Shock continues, due to pulmonary hypertension? PE? Cortisol normal, bleed resolved Chronic anticoagulation on hold due to bleed Chronic AFib P:  Hold all antihypertensives Transfuse for Hgb < 7gm/dL Hold anticoagulants  Continue phenylephrine for MAP > 55 or SBP >90 Check SvO2  RENAL A:   AKI> improving Hyperkalemia 11/30 P:   Kayexelate now, repeat BMET later today Monitor BMET and UOP Replace electrolytes as needed   GASTROINTESTINAL A:   No acute issues P:   Advance diet  HEMATOLOGIC A:  Blood loss anemia > retroperitoneal bleed, rectus sheath bleed; relatively stable  Supratherapeutic lovenox dosing in setting of renal failure Superficial thrombus LLE P:   Serial cbc Continue sub q heparin, monitor for bleeding carefully Hgb transfusion goal Hgb > 7.0  Hold IVC filter in absence DVT (only superficial LLE clot)  INFECTIOUS A:   No clear infectious source P:   -stop zosyn  ENDOCRINE A:    Thyroid disease P:   IV synthroid at half dose  NEUROLOGIC A:  Confusion> resolved Depression No brain mets on 11/20 MRI brain, 11/30 CT head normal P:   Hold all sedatives   FAMILY  - Updates: Called daughter, left message 12/1  Independent CC time 35 minutes  Roselie Awkward, MD [place] [facility] Pager: [phone] Cell: [phone] If no response, call [phone]

## 2014-05-31 NOTE — Plan of Care (Signed)
Problem: Phase II Progression Outcomes Goal: Vital signs remain stable Outcome: Completed/Met Date Met:  05/31/14

## 2014-05-31 NOTE — Plan of Care (Signed)
Problem: Phase I Progression Outcomes Goal: Hemodynamically stable Outcome: Completed/Met Date Met:  05/31/14

## 2014-06-01 DIAGNOSIS — E038 Other specified hypothyroidism: Secondary | ICD-10-CM

## 2014-06-01 DIAGNOSIS — D508 Other iron deficiency anemias: Secondary | ICD-10-CM

## 2014-06-01 LAB — BASIC METABOLIC PANEL
Anion gap: 8 (ref 5–15)
BUN: 27 mg/dL — AB (ref 6–23)
CHLORIDE: 105 meq/L (ref 96–112)
CO2: 25 mEq/L (ref 19–32)
Calcium: 8.2 mg/dL — ABNORMAL LOW (ref 8.4–10.5)
Creatinine, Ser: 0.97 mg/dL (ref 0.50–1.10)
GFR calc Af Amer: 63 mL/min — ABNORMAL LOW (ref >90)
GFR calc non Af Amer: 54 mL/min — ABNORMAL LOW (ref >90)
GLUCOSE: 98 mg/dL (ref 70–99)
POTASSIUM: 4.6 meq/L (ref 3.7–5.3)
Sodium: 138 mEq/L (ref 137–147)

## 2014-06-01 LAB — CBC WITH DIFFERENTIAL/PLATELET
BASOS ABS: 0 10*3/uL (ref 0.0–0.1)
Basophils Relative: 0 % (ref 0–1)
Eosinophils Absolute: 0 10*3/uL (ref 0.0–0.7)
Eosinophils Relative: 0 % (ref 0–5)
HEMATOCRIT: 20.2 % — AB (ref 36.0–46.0)
Hemoglobin: 6.8 g/dL — CL (ref 12.0–15.0)
Lymphocytes Relative: 19 % (ref 12–46)
Lymphs Abs: 1.3 10*3/uL (ref 0.7–4.0)
MCH: 31.2 pg (ref 26.0–34.0)
MCHC: 33.7 g/dL (ref 30.0–36.0)
MCV: 92.7 fL (ref 78.0–100.0)
MONO ABS: 1.4 10*3/uL — AB (ref 0.1–1.0)
Monocytes Relative: 20 % — ABNORMAL HIGH (ref 3–12)
NEUTROS ABS: 4.3 10*3/uL (ref 1.7–7.7)
Neutrophils Relative %: 61 % (ref 43–77)
Platelets: 136 10*3/uL — ABNORMAL LOW (ref 150–400)
RBC: 2.18 MIL/uL — ABNORMAL LOW (ref 3.87–5.11)
RDW: 15.6 % — ABNORMAL HIGH (ref 11.5–15.5)
WBC: 7 10*3/uL (ref 4.0–10.5)

## 2014-06-01 LAB — PREPARE RBC (CROSSMATCH)

## 2014-06-01 LAB — TROPONIN I: Troponin I: <0.3 ng/mL (ref <0.30)

## 2014-06-01 MED ORDER — LEVOTHYROXINE SODIUM 88 MCG PO TABS
88.0000 ug | ORAL_TABLET | Freq: Every day | ORAL | Status: DC
  Administered 2014-06-02 – 2014-06-06 (×5): 88 ug via ORAL
  Filled 2014-06-01 (×8): qty 1

## 2014-06-01 MED ORDER — DIGOXIN 125 MCG PO TABS
0.1250 mg | ORAL_TABLET | Freq: Every day | ORAL | Status: DC
  Administered 2014-06-01: 0.125 mg via ORAL
  Filled 2014-06-01: qty 1

## 2014-06-01 MED ORDER — SODIUM CHLORIDE 0.9 % IV SOLN
Freq: Once | INTRAVENOUS | Status: AC
  Administered 2014-06-01: 12:00:00 via INTRAVENOUS

## 2014-06-01 NOTE — Progress Notes (Signed)
CRITICAL VALUE ALERT  Critical value received:  Hgb=6.8  Date of notification:  06/01/2014  Time of notification:  06:10  Critical value read back:Yes.    Nurse who received alert:  Bethann Humble, RN  MD notified (1st page):  ELINK  Time of first page:  06:10  MD notified (2nd page):  Time of second page:  Responding MD:  Warren Lacy  Time MD responded:  06:10

## 2014-06-01 NOTE — Progress Notes (Addendum)
PULMONARY / CRITICAL CARE MEDICINE   Name: Brittney Thomas MRN: [id number] DOB: Feb 05, 2001    ADMISSION DATE:  05/27/2014   REFERRING MD :  EDP  CHIEF COMPLAINT: Dizziness  INITIAL PRESENTATION: Shock/dizziness  STUDIES:  11/27 ct abd/pelvis>> large rectus sheath hematoma 11/28 repeat Ct abd >> increasing size rectus sheath hematoma, extending now into retroperitoneal space 11/30 Echo> LVEF 60-65%, mild MR, mild mitral calcification, LAE mod-severe dilated, RA severely dilated, mod-severe TR, PA systolic estimated 57QION 11/30 CT head >> NAICP 12/1 V/Q > low prob PE, suspect underlying lung disease  SIGNIFICANT EVENTS: Presented to ED 11/27 shock 11/30 improved hemodynamics, renal function, still on pressors  SUBJECTIVE:  No acute issues, off pressors, Hgb 6.8 today  VITAL SIGNS: Temp:  [98.1 F (36.7 C)-98.9 F (37.2 C)] 98.2 F (36.8 C) (12/02 0800) Pulse Rate:  [81-118] 107 (12/01 1600) Resp:  [17-28] 21 (12/02 0740) BP: (90-135)/(11-74) 112/49 mmHg (12/02 0740) SpO2:  [87 %-100 %] 100 % (12/02 0740) Weight:  [80 kg (176 lb 5.9 oz)] 80 kg (176 lb 5.9 oz) (12/02 0700) HEMODYNAMICS:   VENTILATOR SETTINGS:   INTAKE / OUTPUT:  Intake/Output Summary (Last 24 hours) at 06/01/14 0924 Last data filed at 06/01/14 0740  Gross per 24 hour  Intake 851.32 ml  Output   2376 ml  Net -1524.68 ml    PHYSICAL EXAMINATION:  Gen: comfortable in bed HEENT: NCAT, OP clear PULM: CTA B CV: Irreg irreg, loud systolic murmur apex Ab: BS+, sub q mass in llq unchanged Ext: warm, edema in legs bilaterally Neuro: A&Ox4, maew  LABS:  CBC  Recent Labs Lab 05/31/14 0530 05/31/14 1800 06/01/14 0540  WBC 8.2 8.0 7.0  HGB 7.6* 7.3* 6.8*  HCT 22.3* 21.7* 20.2*  PLT 128* 140* 136*   Coag's  Recent Labs Lab 05/27/14 1809  APTT 43*  INR 1.12   BMET  Recent Labs Lab 05/31/14 0530 05/31/14 1800 06/01/14 0540  NA 131* 139 138  K 5.7* 5.5* 4.6  CL 99 106 105  CO2 22  22 25   BUN 39* 34* 27*  CREATININE 1.44* 1.15* 0.97  GLUCOSE 103* 130* 98   Electrolytes  Recent Labs Lab 05/27/14 1809  05/31/14 0530 05/31/14 1800 06/01/14 0540  CALCIUM 8.0*  < > 7.3* 8.2* 8.2*  MG 1.9  --   --   --   --   PHOS 3.3  --   --   --   --   < > = values in this interval not displayed. Sepsis Markers  Recent Labs Lab 05/27/14 1113 05/27/14 1809 05/28/14 0640 05/28/14 2345  LATICACIDVEN 1.73  --   --  0.7  PROCALCITON  --  1.23 0.98 0.71   ABG  Recent Labs Lab 05/27/14 1530  PHART 7.273*  PCO2ART 47.5*  PO2ART 75.6*   Liver Enzymes  Recent Labs Lab 05/27/14 1117 05/27/14 1809 05/29/14 0745  AST 315* 249* 57*  ALT 383* 359* 169*  ALKPHOS 86 95 86  BILITOT 0.5 0.6 0.8  ALBUMIN 3.0* 3.1* 2.7*   Cardiac Enzymes  Recent Labs Lab 05/27/14 1117  TROPONINI <0.30   Glucose No results for input(s): GLUCAP in the last 168 hours.  Imaging Nm Pulmonary Perf And Vent  05/31/2014   CLINICAL DATA:  80 year old with known lung cancer, superficial venous clot an unexplained shock with elevated pulmonary artery pressure  EXAM: NUCLEAR MEDICINE VENTILATION - PERFUSION LUNG SCAN  TECHNIQUE: Ventilation images were obtained in multiple projections using inhaled  aerosol technetium 99 M DTPA. Perfusion images were obtained in multiple projections after intravenous injection of Tc-35m MAA.  RADIOPHARMACEUTICALS:  36.1 mCi Tc-86m DTPA aerosol and 5.1 mCi Tc-87m MAA  COMPARISON:  None. Correlation with chest radiograph from 05/29/2014  FINDINGS: Ventilation: There is mottled heterogeneous uptake of radiotracer within the bilateral lungs. Areas of apparent radiotracer clumping are noted.  Perfusion: No definite wedge shaped peripheral perfusion defects to suggest acute pulmonary embolism.  Several matched defects are present bilaterally and may correspond to underlying lung disease.  The heart is enlarged.  IMPRESSION: 1. Low probability for acute pulmonary emboli. 2.  Heterogeneous ventilation suggest underlying lung disease. 3. Cardiomegaly.   Electronically Signed   By: Rosemarie Ax   On: 05/31/2014 15:02     ASSESSMENT / PLAN:  PULMONARY OETT N/A A: Elevated PA pressure on echo suggestive of pulmonary hypertension> source uncertain, V/Q not suggestive of PE Metastatic adenocarcinoma of lung, never smoker;   P:   O2 as needed for O2 > 92%  CARDIOVASCULAR L [place] CVL 11/28 >>  A:  Shock now  resolved Chronic anticoagulation on hold due to bleed Chronic AFib RV dilated on Echo with suggestion of Pulmonary hypertension P:  Continue to hold home coreg, consider add back 12/3 depending on BP Add back Dig 1/2 home dose 12/2, increase  12/3 Continue to hold anticoagulants  Check 12 lead/troponin 12/2 due to RV findings  RENAL A:   AKI> resolved Hyperkalemia resolved P:   Monitor BMET and UOP Replace electrolytes as needed   GASTROINTESTINAL A:   No acute issues P:   Regular diet  HEMATOLOGIC A:  Blood loss anemia > retroperitoneal bleed, rectus sheath bleed; 12/2 down again 12/2  Supratherapeutic lovenox dosing in setting of renal failure Superficial thrombus LLE P:   Daily cbc, try to limit phlebotomy > labs again on 12/4 Continue sub q heparin as she is very high risk for DVT, monitor for bleeding carefully 12/3 Transfuse for Hgb of 6.8, goal > 7.0  INFECTIOUS A:   No clear infectious source Zosyn was used transiently for concern for abdominal infection when stranding was seen around hematoma P:   -continue to hold antibiotics  ENDOCRINE A:    Thyroid disease P:   Change to oral synthroid at home dose  NEUROLOGIC A:  Confusion> resolved Depression No brain mets on 11/20 MRI brain, 11/30 CT head normal P:   Hold all sedatives   FAMILY  - Updates: Called daughter 12/2  Change status to [place], MD [place] [facility] Pager: [phone] Cell: [phone] If no response, call [phone]

## 2014-06-02 LAB — CBC
HCT: 24.3 % — ABNORMAL LOW (ref 36.0–46.0)
Hemoglobin: 7.9 g/dL — ABNORMAL LOW (ref 12.0–15.0)
MCH: 30.6 pg (ref 26.0–34.0)
MCHC: 32.5 g/dL (ref 30.0–36.0)
MCV: 94.2 fL (ref 78.0–100.0)
PLATELETS: 180 10*3/uL (ref 150–400)
RBC: 2.58 MIL/uL — ABNORMAL LOW (ref 3.87–5.11)
RDW: 15.9 % — AB (ref 11.5–15.5)
WBC: 7 10*3/uL (ref 4.0–10.5)

## 2014-06-02 LAB — CULTURE, BLOOD (ROUTINE X 2)
CULTURE: NO GROWTH
CULTURE: NO GROWTH

## 2014-06-02 LAB — TYPE AND SCREEN
ABO/RH(D): O POS
Antibody Screen: NEGATIVE
Unit division: 0

## 2014-06-02 LAB — COMPREHENSIVE METABOLIC PANEL
ALBUMIN: 2.5 g/dL — AB (ref 3.5–5.2)
ALT: 59 U/L — ABNORMAL HIGH (ref 0–35)
AST: 19 U/L (ref 0–37)
Alkaline Phosphatase: 111 U/L (ref 39–117)
Anion gap: 9 (ref 5–15)
BUN: 24 mg/dL — ABNORMAL HIGH (ref 6–23)
CALCIUM: 8.4 mg/dL (ref 8.4–10.5)
CO2: 27 meq/L (ref 19–32)
CREATININE: 0.75 mg/dL (ref 0.50–1.10)
Chloride: 106 mEq/L (ref 96–112)
GFR calc Af Amer: >90 mL/min (ref >90)
GFR, EST NON AFRICAN AMERICAN: 78 mL/min — AB (ref >90)
Glucose, Bld: 90 mg/dL (ref 70–99)
Potassium: 4.9 mEq/L (ref 3.7–5.3)
Sodium: 142 mEq/L (ref 137–147)
Total Bilirubin: 0.7 mg/dL (ref 0.3–1.2)
Total Protein: 5.7 g/dL — ABNORMAL LOW (ref 6.0–8.3)

## 2014-06-02 MED ORDER — DIGOXIN 125 MCG PO TABS: 0.1250 mg | ORAL_TABLET | Freq: Once | ORAL | Status: DC

## 2014-06-02 MED ORDER — CARVEDILOL 3.125 MG PO TABS
3.1250 mg | ORAL_TABLET | Freq: Two times a day (BID) | ORAL | Status: DC
  Administered 2014-06-02 – 2014-06-06 (×9): 3.125 mg via ORAL
  Filled 2014-06-02 (×13): qty 1

## 2014-06-02 MED ORDER — DIGOXIN 250 MCG PO TABS
0.2500 mg | ORAL_TABLET | Freq: Every day | ORAL | Status: DC
  Administered 2014-06-02 – 2014-06-06 (×5): 0.25 mg via ORAL
  Filled 2014-06-02: qty 2
  Filled 2014-06-02 (×2): qty 1
  Filled 2014-06-02: qty 2
  Filled 2014-06-02 (×2): qty 1

## 2014-06-02 NOTE — Progress Notes (Signed)
Spiritual care provided support with pt d/t length of stay, ca.  Met with pt at bedside.  Pt preparing to be transferred to floor.    Brittney Thomas has not been to her home since August.  Has been staying with daughter following a fall.  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  Her daughter lives in [place] that she will be able to discharge by early next week.    Will follow for continued support needs.

## 2014-06-02 NOTE — Plan of Care (Signed)
Problem: Phase III Progression Outcomes Goal: Pain controlled on oral analgesia Outcome: Completed/Met Date Met:  06/02/14     

## 2014-06-02 NOTE — Progress Notes (Signed)
PULMONARY / CRITICAL CARE MEDICINE   Name: Brittney Thomas MRN: [id number] DOB: 1933-03-19    ADMISSION DATE:  05/27/2014   REFERRING MD :  EDP  CHIEF COMPLAINT: Dizziness  INITIAL PRESENTATION: Shock/dizziness  STUDIES:  11/27  CTabd/pelvis >> large rectus sheath hematoma 11/28  Repeat Ct abd >> increasing size rectus sheath hematoma, extending now into retroperitoneal space 11/30  ECHO >> LVEF 60-65%, mild MR, mild mitral calcification, LAE mod-severe dilated, RA severely dilated, mod-severe TR, PA systolic estimated 56LOVF 11/30  CT head >> NAICP 12/01  VQ >> low probability for PE, heterogenous ventilation suggestive of underlying lung disease, cardiomegaly  SIGNIFICANT EVENTS: 11/27  Presented to ED in shock; received 5 U PRBC between 11/27 and 11/29 11/30  improved hemodynamics, renal function, still on pressors 12/2 off pressors, transfused 1 U PRBC  SUBJECTIVE:  Feels well, PT did not come by yesterday, was out of bed this morning, appetite improving   VITAL SIGNS: Temp:  [97.6 F (36.4 C)-98.5 F (36.9 C)] 98 F (36.7 C) (12/03 0800) Resp:  [17-30] 22 (12/03 0800) BP: (104-141)/(46-113) 126/71 mmHg (12/03 0800) SpO2:  [91 %-100 %] 100 % (12/03 0700) Weight:  [165 lb 2 oz (74.9 kg)] 165 lb 2 oz (74.9 kg) (12/03 0611)   HEMODYNAMICS:   VENTILATOR SETTINGS:   INTAKE / OUTPUT:  Intake/Output Summary (Last 24 hours) at 06/02/14 0937 Last data filed at 06/02/14 0800  Gross per 24 hour  Intake 703.75 ml  Output   1031 ml  Net -327.25 ml    PHYSICAL EXAMINATION: Gen: comfortable in chair HEENT: NCAT, OP clear PULM: CTA B CV: Irreg irreg, systolic murmur Ab: BS+, exam unchanged Ext: warm, edema in legs bilaterally Neuro: A&Ox4, maew  LABS:  CBC  Recent Labs Lab 05/31/14 1800 06/01/14 0540 06/02/14 0600  WBC 8.0 7.0 7.0  HGB 7.3* 6.8* 7.9*  HCT 21.7* 20.2* 24.3*  PLT 140* 136* 180   Coag's  Recent Labs Lab 05/27/14 1809  APTT 43*  INR 1.12    BMET  Recent Labs Lab 05/31/14 1800 06/01/14 0540 06/02/14 0600  NA 139 138 142  K 5.5* 4.6 4.9  CL 106 105 106  CO2 22 25 27   BUN 34* 27* 24*  CREATININE 1.15* 0.97 0.75  GLUCOSE 130* 98 90   Electrolytes  Recent Labs Lab 05/27/14 1809  05/31/14 1800 06/01/14 0540 06/02/14 0600  CALCIUM 8.0*  < > 8.2* 8.2* 8.4  MG 1.9  --   --   --   --   PHOS 3.3  --   --   --   --   < > = values in this interval not displayed.   Sepsis Markers  Recent Labs Lab 05/27/14 1113 05/27/14 1809 05/28/14 0640 05/28/14 2345  LATICACIDVEN 1.73  --   --  0.7  PROCALCITON  --  1.23 0.98 0.71   ABG  Recent Labs Lab 05/27/14 1530  PHART 7.273*  PCO2ART 47.5*  PO2ART 75.6*   Liver Enzymes  Recent Labs Lab 05/27/14 1809 05/29/14 0745 06/02/14 0600  AST 249* 57* 19  ALT 359* 169* 59*  ALKPHOS 95 86 111  BILITOT 0.6 0.8 0.7  ALBUMIN 3.1* 2.7* 2.5*   Cardiac Enzymes  Recent Labs Lab 05/27/14 1117 06/01/14 1015  TROPONINI <0.30 <0.30   Glucose No results for input(s): GLUCAP in the last 168 hours.  Imaging No results found.   ASSESSMENT / PLAN:  PULMONARY OETT N/A A: Elevated PA pressure on  echo suggestive of pulmonary hypertension, source unclear.  PE ruled out with low probability VQ on 12/1; WHO group 1 or 2 or 3 pulmonary hypertension unlikely based on history. Metastatic adenocarcinoma of lung, never smoker   P:   O2 as needed for O2 > 92% Pulmonary hygiene   CARDIOVASCULAR L  CVL 11/28 >>  A:  Hemorrhagic Shock resolved  Chronic anticoagulation on hold due to bleed Chronic AFib P:  Added back dig 12/2, increased to home dose 12/3 Add back coreg 1/2 home dose 12/3 Transfuse for Hgb < 7gm/dL Hold anticoagulants    RENAL A:   AKI> improving Hyperkalemia 11/30 > resolved P:   Monitor BMET and UOP Replace electrolytes as needed   GASTROINTESTINAL A:   No acute issues P:   Advance diet as tolerated   HEMATOLOGIC A:  Blood loss  anemia > retroperitoneal bleed, rectus sheath bleed; stable  Supratherapeutic lovenox dosing in setting of renal failure Superficial thrombus LLE P:   Serial cbc Continue sub q heparin, monitor for bleeding carefully, would not fully anticoagulate at this time Hgb transfusion goal Hgb > 7.0  Hold IVC filter in absence DVT (only superficial LLE clot)  INFECTIOUS A:   No clear infectious source P:   Monitor off abx   ENDOCRINE A:    Thyroid disease P:   Oral synthroid home dose  NEUROLOGIC A:  Confusion - resolved Depression No brain mets on 11/20 MRI brain, 11/30 CT head normal P:   Hold all sedatives   FAMILY  - Updates: daughter updated by me on 12/3  Dispo: to floor, as she had a significant bleed, would monitor through weekend and plan for d/c on Monday; need PT to assess strength and functional assessment as she is quite weak and may need home PT  Roselie Awkward, MD [place] [facility] Pager: [phone] Cell: [phone] If no response, call [phone]

## 2014-06-02 NOTE — Progress Notes (Signed)
CARE MANAGEMENT NOTE 06/02/2014  Patient:  Brittney Thomas, Brittney Thomas   Account Number:  [id number]  Date Initiated:  05/30/2014  Documentation initiated by:  Vaiden Adames  Subjective/Objective Assessment:   80 yo WF dx adenocarcinoma of lung via FOB in 10/15. She is on LMWH injections for hx CAF/DVT & was seen in Oncology clinic 11/25 and was given   Granix injection for neutropenia. Family reports steady decline with dizziness and lethgary.     Action/Plan:   tbd   Anticipated DC Date:  06/03/2014   Anticipated DC Plan:  HOME/SELF CARE         Choice offered to / List presented to:             Status of service:  In process, will continue to follow Medicare Important Message given?   (If response is "NO", the following Medicare IM given date fields will be blank) Date Medicare IM given:   Medicare IM given by:   Date Additional Medicare IM given:   Additional Medicare IM given by:    Discharge Disposition:    Per UR Regulation:  Reviewed for med. necessity/level of care/duration of stay  If discussed at [place] of Stay Meetings, dates discussed:    Comments:  [id number]/Deryk Bozman Eldridge Dace, [place], [place] [phone] Chart Reviewed. Discharge needs at time of review: None present will follow for needs. patient being moved from [facility] to [facility] floor [id number].  [id number]/SLPNPY Rosana Hoes, RN, BSN, CCM Chart reviewed. Discharge needs and patient's stay to be reviewed and followed by case manager.

## 2014-06-02 NOTE — Progress Notes (Signed)
PT Cancellation Note  Patient Details Name: Brittney Thomas MRN: [id number] DOB: 1933/01/25   Cancelled Treatment:    Reason Eval/Treat Not Completed:  (patient justreturned to bed. checkbacklateras schedule allows.)   [name] 06/02/2014, 11:11 AM [name] PT [phone]

## 2014-06-02 NOTE — Evaluation (Signed)
Physical Therapy Evaluation Patient Details Name: Brittney Thomas MRN: [id number] DOB: Feb 11, 1933 Today's Date: 06/02/2014   History of Present Illness  80 yo WF dx with adenocarcinoma of lung, hx CAF/DVT and was seen in Oncology clinic 11/25 and was given   Granix injection for neutropenia. Family reports steady decline with dizziness and lethargy. [facility]27 and was found to have hemoglobin of 5.2 and exam reveals findings consistent with rectal sheath hematoma. Her shock is complicated by bradycardia   Clinical Impression  Patient tolerated sitting at bed, had been up and declined again. Pt. Will benefit from PT to address problems listed in note below.   Follow Up Recommendations SNF;Supervision/Assistance - 24 hour    Equipment Recommendations  None recommended by PT    Recommendations for Other Services OT consult     Precautions / Restrictions Precautions Precautions: Fall      Mobility  Bed Mobility Overal bed mobility: Needs Assistance Bed Mobility: Supine to Sit;Sit to Supine     Supine to sit: Mod assist Sit to supine: Mod assist   General bed mobility comments: bed pad used to slide  pelvis around, assist for legs onto bed  Transfers                 General transfer comment: pt declined to stand, did get up to recliner with 2 persons earlier today.  Ambulation/Gait                Stairs            Wheelchair Mobility    Modified Rankin (Stroke Patients Only)       Balance Overall balance assessment: Needs assistance;History of Falls Sitting-balance support: Feet supported;Bilateral upper extremity supported Sitting balance-Leahy Scale: Fair Sitting balance - Comments: sat x 10 minutes, c/o fatigue                                     Pertinent Vitals/Pain Pain Assessment: 0-10 Pain Score: 3  Pain Location: legs, abdomen Pain Descriptors / Indicators: Aching;Cramping Pain Intervention(s):  Repositioned;Monitored during session    Home Living Family/patient expects to be discharged to:: Private residence Living Arrangements: Children Available Help at Discharge: Family Type of Home: House Home Access: Stairs to enter Entrance Stairs-Rails: Psychiatric nurse of Steps: 3 Home Layout: One level Home Equipment: Environmental consultant - 2 wheels;Walker - 4 wheels;Bedside commode      Prior Function Level of Independence: Independent with assistive device(s)   Gait / Transfers Assistance Needed: in house  ADL's / Homemaking Assistance Needed: family fixes light meals when not home        Hand Dominance        Extremity/Trunk Assessment   Upper Extremity Assessment: Generalized weakness           Lower Extremity Assessment: Generalized weakness      Cervical / Trunk Assessment: Normal  Communication      Cognition Arousal/Alertness: Awake/alert Behavior During Therapy: WFL for tasks assessed/performed Overall Cognitive Status: Within Functional Limits for tasks assessed                      General Comments      Exercises        Assessment/Plan    PT Assessment Patient needs continued PT services  PT Diagnosis Difficulty walking;Generalized weakness;Acute pain   PT Problem List Decreased strength;Decreased  activity tolerance;Decreased mobility;Decreased knowledge of precautions;Decreased safety awareness;Decreased knowledge of use of DME;Pain;Cardiopulmonary status limiting activity  PT Treatment Interventions DME instruction;Gait training;Functional mobility training;Therapeutic activities;Therapeutic exercise;Patient/family education   PT Goals (Current goals can be found in the Care Plan section) Acute Rehab PT Goals Patient Stated Goal: to go back to my home, I've been gone since August PT Goal Formulation: With patient Time For Goal Achievement: 06/16/14 Potential to Achieve Goals: Good    Frequency Min 3X/week   Barriers to  discharge Decreased caregiver support      Co-evaluation               End of Session   Activity Tolerance: Patient tolerated treatment well Patient left: in bed;with call bell/phone within reach Nurse Communication: Mobility status         Time: 3754-3606 PT Time Calculation (min) (ACUTE ONLY): 28 min   Charges:   PT Evaluation $Initial PT Evaluation Tier I: 1 Procedure PT Treatments $Therapeutic Activity: 23-37 mins   PT G Codes:          [name] 06/02/2014, 3:17 PM [name] PT [phone]

## 2014-06-03 DIAGNOSIS — C801 Malignant (primary) neoplasm, unspecified: Secondary | ICD-10-CM

## 2014-06-03 DIAGNOSIS — R579 Shock, unspecified: Secondary | ICD-10-CM

## 2014-06-03 LAB — CBC WITH DIFFERENTIAL/PLATELET
BASOS ABS: 0 10*3/uL (ref 0.0–0.1)
Basophils Relative: 0 % (ref 0–1)
Eosinophils Absolute: 0.1 10*3/uL (ref 0.0–0.7)
Eosinophils Relative: 1 % (ref 0–5)
HCT: 24.5 % — ABNORMAL LOW (ref 36.0–46.0)
Hemoglobin: 7.7 g/dL — ABNORMAL LOW (ref 12.0–15.0)
LYMPHS PCT: 14 % (ref 12–46)
Lymphs Abs: 1 10*3/uL (ref 0.7–4.0)
MCH: 30.4 pg (ref 26.0–34.0)
MCHC: 31.4 g/dL (ref 30.0–36.0)
MCV: 96.8 fL (ref 78.0–100.0)
MONOS PCT: 19 % — AB (ref 3–12)
Monocytes Absolute: 1.4 10*3/uL — ABNORMAL HIGH (ref 0.1–1.0)
Neutro Abs: 4.8 10*3/uL (ref 1.7–7.7)
Neutrophils Relative %: 66 % (ref 43–77)
PLATELETS: 226 10*3/uL (ref 150–400)
RBC: 2.53 MIL/uL — ABNORMAL LOW (ref 3.87–5.11)
RDW: 15.9 % — ABNORMAL HIGH (ref 11.5–15.5)
WBC: 7.3 10*3/uL (ref 4.0–10.5)

## 2014-06-03 LAB — BASIC METABOLIC PANEL
ANION GAP: 6 (ref 5–15)
BUN: 20 mg/dL (ref 6–23)
CHLORIDE: 104 meq/L (ref 96–112)
CO2: 28 mEq/L (ref 19–32)
Calcium: 8.6 mg/dL (ref 8.4–10.5)
Creatinine, Ser: 0.72 mg/dL (ref 0.50–1.10)
GFR, EST NON AFRICAN AMERICAN: 80 mL/min — AB (ref >90)
Glucose, Bld: 92 mg/dL (ref 70–99)
POTASSIUM: 5.1 meq/L (ref 3.7–5.3)
SODIUM: 138 meq/L (ref 137–147)

## 2014-06-03 MED ORDER — ZOLPIDEM TARTRATE 5 MG PO TABS: 5.0000 mg | ORAL_TABLET | Freq: Every evening | ORAL | Status: DC | PRN

## 2014-06-03 MED ORDER — ZOLPIDEM TARTRATE 5 MG PO TABS
5.0000 mg | ORAL_TABLET | Freq: Once | ORAL | Status: AC
  Administered 2014-06-03: 5 mg via ORAL

## 2014-06-03 NOTE — Progress Notes (Signed)
RN calling  Patient c/o insomnia Wants something to sleep better  Plan ambien x1  Dr. Brand Males, M.D., [facility].C.P Pulmonary and Critical Care Medicine Staff Physician [place] [facility] Pager: [phone], If no answer or between  15:00h - 7:00h: call [phone]  06/03/2014 11:26 PM

## 2014-06-03 NOTE — Progress Notes (Signed)
06/03/14 Edwyna Shell RN BSN CM [phone] IM notice provided to patient. Patient lived at home independently prior to hospitalization and has 2 rolling walker and a rollator. Patient stated that she is considering rehab upon discharge and will discuss this further with her children. CM will continue to follow.

## 2014-06-03 NOTE — Progress Notes (Signed)
Physical Therapy Treatment Patient Details Name: Brittney Thomas MRN: [id number] DOB: Oct 10, 1932 Today's Date: 06-20-14    History of Present Illness 80 yo WF dx with adenocarcinoma of lung, hx CAF/DVT and was seen in Oncology clinic 11/25 and was given Granix injection for neutropenia. Family reports steady decline with dizziness and lethargy. She presents to [facility] ED 11/27 and was found to have hemoglobin of 5.2 and exam reveals findings consistent with rectal sheath hematoma. Her shock is complicated by bradycardia     PT Comments    Pt assisted to EOB however felt unable to tolerate any further mobility at this time.  Removed O2 [place] for sitting EOB and pt with SPO2 93% at lowest on room air.  Follow Up Recommendations  SNF;Supervision/Assistance - 24 hour     Equipment Recommendations  None recommended by PT    Recommendations for Other Services       Precautions / Restrictions Precautions Precautions: Fall    Mobility  Bed Mobility Overal bed mobility: Needs Assistance Bed Mobility: Supine to Sit;Sit to Supine     Supine to sit: Max assist;+2 for physical assistance Sit to supine: Mod assist;+2 for physical assistance   General bed mobility comments: assist for mostly for lower body and scooting to EOB with bed pad,  assist for LEs onto bed  Transfers Overall transfer level:  (pt declined, stating she felt unable to tolerate today)                  Ambulation/Gait                 Stairs            Wheelchair Mobility    Modified Rankin (Stroke Patients Only)       Balance Overall balance assessment: Needs assistance Sitting-balance support: Feet supported;Bilateral upper extremity supported Sitting balance-Leahy Scale: Fair Sitting balance - Comments: sat EOB approx 8 min, fatigued quickly, denies SOB, O2 removed sitting EOB and SpO2 93% at lowest on room air                            Cognition Arousal/Alertness:  Awake/alert Behavior During Therapy: WFL for tasks assessed/performed Overall Cognitive Status: Within Functional Limits for tasks assessed                      Exercises      General Comments        Pertinent Vitals/Pain Pain Assessment: No/denies pain    Home Living                      Prior Function            PT Goals (current goals can now be found in the care plan section) Progress towards PT goals: Progressing toward goals    Frequency  Min 3X/week    PT Plan Current plan remains appropriate    Co-evaluation             End of Session   Activity Tolerance: Patient limited by fatigue Patient left: in bed;with call bell/phone within reach     Time: 1359-1417 PT Time Calculation (min) (ACUTE ONLY): 18 min  Charges:  $Therapeutic Activity: 8-22 mins                    G Codes:      Summit Borchardt,KATHrine E 06/20/14, 3:42 PM Carmelia Bake,  PT, DPT 06/03/2014 Pager: [phone]

## 2014-06-03 NOTE — Progress Notes (Addendum)
Clinical Social Work  MIDAS not working due to computer problems. Patient agreeable to be faxed out to [facility]CSW will follow up over the weekend with bed offers for possible DC on Monday. Full assessment and placement note placed in chart.  [place], [place] [phone]

## 2014-06-03 NOTE — Progress Notes (Signed)
PULMONARY / CRITICAL CARE MEDICINE   Name: Brittney Thomas MRN: [id number] DOB: 1932-11-03    ADMISSION DATE:  05/27/2014   REFERRING MD :  EDP  CHIEF COMPLAINT: Dizziness  INITIAL PRESENTATION: Shock/dizziness  STUDIES:  11/27  CTabd/pelvis >> large rectus sheath hematoma 11/28  Repeat Ct abd >> increasing size rectus sheath hematoma, extending now into retroperitoneal space 11/30  ECHO >> LVEF 60-65%, mild MR, mild mitral calcification, LAE mod-severe dilated, RA severely dilated, mod-severe TR, PA systolic estimated 95GLOV 11/30  CT head >> NAICP 12/01  VQ >> low probability for PE, heterogenous ventilation suggestive of underlying lung disease, cardiomegaly  SIGNIFICANT EVENTS: 11/27  Presented to ED in shock; received 5 U PRBC between 11/27 and 11/29 11/30  improved hemodynamics, renal function, still on pressors 12/2 off pressors, transfused 1 U PRBC  SUBJECTIVE:  Feels weak, hopeful to get out of bed.  Tolerating PO's well.     VITAL SIGNS: Temp:  [98.3 F (36.8 C)-98.7 F (37.1 C)] 98.3 F (36.8 C) (12/04 0500) Pulse Rate:  [60-111] 60 (12/04 0500) Resp:  [18-22] 20 (12/04 0500) BP: (108-121)/(52-67) 112/52 mmHg (12/04 0500) SpO2:  [99 %-100 %] 100 % (12/04 0500) Weight:  [165 lb 2 oz (74.9 kg)] 165 lb 2 oz (74.9 kg) (12/04 0500)   HEMODYNAMICS:   VENTILATOR SETTINGS:   INTAKE / OUTPUT:  Intake/Output Summary (Last 24 hours) at 06/03/14 1318 Last data filed at 06/03/14 0702  Gross per 24 hour  Intake    350 ml  Output    550 ml  Net   -200 ml    PHYSICAL EXAMINATION: Gen: comfortable in bed HEENT: NCAT, OP clear PULM: CTA B CV: Irreg irreg, systolic murmur Ab: BS+, exam unchanged Ext: warm, edema in legs bilaterally Neuro: A&Ox4, maew  LABS:  CBC  Recent Labs Lab 06/01/14 0540 06/02/14 0600 06/03/14 0435  WBC 7.0 7.0 7.3  HGB 6.8* 7.9* 7.7*  HCT 20.2* 24.3* 24.5*  PLT 136* 180 226   Coag's  Recent Labs Lab 05/27/14 1809  APTT 43*   INR 1.12   BMET  Recent Labs Lab 06/01/14 0540 06/02/14 0600 06/03/14 0435  NA 138 142 138  K 4.6 4.9 5.1  CL 105 106 104  CO2 25 27 28   BUN 27* 24* 20  CREATININE 0.97 0.75 0.72  GLUCOSE 98 90 92   Electrolytes  Recent Labs Lab 05/27/14 1809  06/01/14 0540 06/02/14 0600 06/03/14 0435  CALCIUM 8.0*  < > 8.2* 8.4 8.6  MG 1.9  --   --   --   --   PHOS 3.3  --   --   --   --   < > = values in this interval not displayed.   Sepsis Markers  Recent Labs Lab 05/27/14 1809 05/28/14 0640 05/28/14 2345  LATICACIDVEN  --   --  0.7  PROCALCITON 1.23 0.98 0.71   ABG  Recent Labs Lab 05/27/14 1530  PHART 7.273*  PCO2ART 47.5*  PO2ART 75.6*   Liver Enzymes  Recent Labs Lab 05/27/14 1809 05/29/14 0745 06/02/14 0600  AST 249* 57* 19  ALT 359* 169* 59*  ALKPHOS 95 86 111  BILITOT 0.6 0.8 0.7  ALBUMIN 3.1* 2.7* 2.5*   Cardiac Enzymes  Recent Labs Lab 06/01/14 1015  TROPONINI <0.30   Glucose No results for input(s): GLUCAP in the last 168 hours.  Imaging No results found.   ASSESSMENT / PLAN:  PULMONARY OETT N/A A: Elevated PA  pressure on echo suggestive of pulmonary hypertension, source unclear.  PE ruled out with low probability VQ on 12/1; WHO group 1 or 2 or 3 pulmonary hypertension unlikely based on history. Metastatic adenocarcinoma of lung, never smoker   P:   O2 as needed for O2 > 92% Pulmonary hygiene  Assess for ambulatory O2 needs prior to discharge.   CARDIOVASCULAR L [place] CVL 11/28 >>  A:  Hemorrhagic Shock resolved  Chronic anticoagulation on hold due to bleed Chronic AFib P:  Added back dig 12/2, increased to home dose 12/3 Add back coreg 1/2 home dose 12/3 Transfuse for Hgb < 7gm/dL Hold anticoagulants    RENAL A:   AKI> improving Hyperkalemia 11/30 > resolved P:   Monitor BMET and UOP Replace electrolytes as needed  GASTROINTESTINAL A:   No acute issues P:   Advance diet as tolerated   HEMATOLOGIC A:   Blood loss anemia > retroperitoneal bleed, rectus sheath bleed; stable  Supratherapeutic lovenox dosing in setting of renal failure Superficial thrombus LLE P:   Trend CBC Continue sub q heparin, monitor for bleeding carefully, would not fully anticoagulate at this time Hgb transfusion goal Hgb > 7.0  Hold IVC filter in absence DVT (only superficial LLE clot)  INFECTIOUS A:   No clear infectious source P:   Monitor off abx   ENDOCRINE A:    Thyroid disease P:   Oral synthroid home dose  NEUROLOGIC A:  Confusion - resolved Depression No brain mets on 11/20 MRI brain, 11/30 CT head normal P:   Hold all sedatives   FAMILY  - Updates: daughter updated by me on 12/3  Dispo: She had a significant bleed, would monitor through weekend and plan for d/c on Monday; need PT to assess strength and functional assessment as she is quite weak and may need home PT    Noe Gens, NP-C [facility] Pgr: [phone] or [phone]    Baltazar Apo, MD, PhD 06/07/2014, 7:25 PM [facility] [phone] or if no answer [phone]

## 2014-06-04 DIAGNOSIS — I809 Phlebitis and thrombophlebitis of unspecified site: Secondary | ICD-10-CM

## 2014-06-04 DIAGNOSIS — I482 Chronic atrial fibrillation: Secondary | ICD-10-CM

## 2014-06-04 LAB — BASIC METABOLIC PANEL
ANION GAP: 7 (ref 5–15)
BUN: 20 mg/dL (ref 6–23)
CALCIUM: 8.7 mg/dL (ref 8.4–10.5)
CO2: 29 meq/L (ref 19–32)
Chloride: 104 mEq/L (ref 96–112)
Creatinine, Ser: 0.71 mg/dL (ref 0.50–1.10)
GFR calc Af Amer: >90 mL/min (ref >90)
GFR calc non Af Amer: 80 mL/min — ABNORMAL LOW (ref >90)
GLUCOSE: 90 mg/dL (ref 70–99)
POTASSIUM: 5.3 meq/L (ref 3.7–5.3)
SODIUM: 140 meq/L (ref 137–147)

## 2014-06-04 LAB — CBC
HCT: 24.5 % — ABNORMAL LOW (ref 36.0–46.0)
HEMOGLOBIN: 7.7 g/dL — AB (ref 12.0–15.0)
MCH: 30.9 pg (ref 26.0–34.0)
MCHC: 31.4 g/dL (ref 30.0–36.0)
MCV: 98.4 fL (ref 78.0–100.0)
PLATELETS: 267 10*3/uL (ref 150–400)
RBC: 2.49 MIL/uL — AB (ref 3.87–5.11)
RDW: 15.3 % (ref 11.5–15.5)
WBC: 9.2 10*3/uL (ref 4.0–10.5)

## 2014-06-04 NOTE — Clinical Social Work Note (Signed)
CSW met with pt at bedside to present bed options  Pt stated that [facility] is possibility due to location  Pt stated that  CSW should contact her daughter, [name] who is POA   Pt stated that she lives with her daughter and she  provides all transportation  CSW left bed offers at bedside  CSW called and spoke with pt;s daughter sue to provide bed list  CSW discussed with daughter and pt that they should pick a facility by Monday  Pt's daughter stated that she would pull up facilities on line, speak with her mother over the wekend and then call CSW with a decision  CSW provided weekend phone number for pt's dauther and let her know the list was also left in the room  CSW will wait to hear decision from pt and her family  Dede Query, [place] Worker - Weekend Coverage cell #: [phone]

## 2014-06-04 NOTE — Progress Notes (Signed)
PULMONARY / CRITICAL CARE MEDICINE   Name: Brittney Thomas MRN: [id number] DOB: 30-Dec-1932    ADMISSION DATE:  05/27/2014   REFERRING MD :  EDP  CHIEF COMPLAINT: Dizziness  INITIAL PRESENTATION: Shock/dizziness  STUDIES:  11/27  CTabd/pelvis >> large rectus sheath hematoma 11/28  Repeat Ct abd >> increasing size rectus sheath hematoma, extending now into retroperitoneal space 11/30  ECHO >> LVEF 60-65%, mild MR, mild mitral calcification, LAE mod-severe dilated, RA severely dilated, mod-severe TR, PA systolic estimated 25ENID 11/30  CT head >> NAICP 12/01  VQ >> low probability for PE, heterogenous ventilation suggestive of underlying lung disease, cardiomegaly  SIGNIFICANT EVENTS: 11/27  Presented to ED in shock; received 5 U PRBC between 11/27 and 11/29 11/30  improved hemodynamics, renal function, still on pressors 12/2 off pressors, transfused 1 U PRBC  SUBJECTIVE:   Stable overnight.  No new complaints.  Looking forward to more PT to get stronger.   VITAL SIGNS: Temp:  [98.4 F (36.9 C)-99.1 F (37.3 C)] 98.4 F (36.9 C) (12/05 1329) Pulse Rate:  [66-114] 114 (12/05 1329) Resp:  [16-18] 18 (12/05 1329) BP: (110-118)/(56-61) 110/61 mmHg (12/05 1329) SpO2:  [97 %-99 %] 97 % (12/05 1329) Weight:  [75 kg (165 lb 5.5 oz)] 75 kg (165 lb 5.5 oz) (12/05 0456)   HEMODYNAMICS:   VENTILATOR SETTINGS:   INTAKE / OUTPUT:  Intake/Output Summary (Last 24 hours) at 06/04/14 1538 Last data filed at 06/04/14 0900  Gross per 24 hour  Intake    480 ml  Output      0 ml  Net    480 ml    PHYSICAL EXAMINATION: Gen: overweight female, in nad HEENT: nose with d/c, no LN or TMG PULM: basilar crackles, no wheezing CV: Irreg irreg, systolic murmur Ab: BS+, soft, no pain on palpation Ext: warm, edema in legs bilaterally Neuro: alert, moves all 4.   LABS:  CBC  Recent Labs Lab 06/02/14 0600 06/03/14 0435 06/04/14 0440  WBC 7.0 7.3 9.2  HGB 7.9* 7.7* 7.7*  HCT 24.3*  24.5* 24.5*  PLT 180 226 267   Coag's No results for input(s): APTT, INR in the last 168 hours. BMET  Recent Labs Lab 06/02/14 0600 06/03/14 0435 06/04/14 0440  NA 142 138 140  K 4.9 5.1 5.3  CL 106 104 104  CO2 27 28 29   BUN 24* 20 20  CREATININE 0.75 0.72 0.71  GLUCOSE 90 92 90   Electrolytes  Recent Labs Lab 06/02/14 0600 06/03/14 0435 06/04/14 0440  CALCIUM 8.4 8.6 8.7     Sepsis Markers  Recent Labs Lab 05/28/14 2345  LATICACIDVEN 0.7  PROCALCITON 0.71   ABG No results for input(s): PHART, PCO2ART, PO2ART in the last 168 hours. Liver Enzymes  Recent Labs Lab 05/29/14 0745 06/02/14 0600  AST 57* 19  ALT 169* 59*  ALKPHOS 86 111  BILITOT 0.8 0.7  ALBUMIN 2.7* 2.5*   Cardiac Enzymes  Recent Labs Lab 06/01/14 1015  TROPONINI <0.30   Glucose No results for input(s): GLUCAP in the last 168 hours.  Imaging No results found.   ASSESSMENT / PLAN:  PULMONARY OETT N/A A: Elevated PA pressure on echo suggestive of pulmonary hypertension, source unclear.  PE ruled out with low probability VQ on 12/1; WHO group 1 or 2 or 3 pulmonary hypertension unlikely based on history. Metastatic adenocarcinoma of lung, never smoker   P:   O2 as needed for O2 > 92% Pulmonary hygiene  Assess  for ambulatory O2 needs prior to discharge.   CARDIOVASCULAR L [place] CVL 11/28 >>  A:  Hemorrhagic Shock resolved  Chronic anticoagulation on hold due to bleed Chronic AFib P:  Added back dig 12/2, increased to home dose 12/3 Add back coreg 1/2 home dose 12/3 Transfuse for Hgb < 7gm/dL Hold anticoagulants    RENAL A:   AKI> improving Hyperkalemia 11/30 > still at upper limit of range. P:   Monitor BMET and UOP Replace electrolytes as needed   HEMATOLOGIC A:  Blood loss anemia > retroperitoneal bleed, rectus sheath bleed; stable  Supratherapeutic lovenox dosing in setting of renal failure Superficial thrombus LLE P:   Trend CBC.  Is stable  today. Continue sub q heparin, monitor for bleeding carefully, would not fully anticoagulate at this time Hgb transfusion goal Hgb > 7.0  Hold IVC filter in absence DVT (only superficial LLE clot)

## 2014-06-05 LAB — BASIC METABOLIC PANEL
ANION GAP: 7 (ref 5–15)
BUN: 20 mg/dL (ref 6–23)
CO2: 30 mEq/L (ref 19–32)
CREATININE: 0.72 mg/dL (ref 0.50–1.10)
Calcium: 8.7 mg/dL (ref 8.4–10.5)
Chloride: 100 mEq/L (ref 96–112)
GFR, EST NON AFRICAN AMERICAN: 80 mL/min — AB (ref >90)
Glucose, Bld: 93 mg/dL (ref 70–99)
POTASSIUM: 5.3 meq/L (ref 3.7–5.3)
Sodium: 137 mEq/L (ref 137–147)

## 2014-06-05 LAB — CBC
HCT: 24.8 % — ABNORMAL LOW (ref 36.0–46.0)
Hemoglobin: 7.8 g/dL — ABNORMAL LOW (ref 12.0–15.0)
MCH: 31.5 pg (ref 26.0–34.0)
MCHC: 31.5 g/dL (ref 30.0–36.0)
MCV: 100 fL (ref 78.0–100.0)
Platelets: 294 10*3/uL (ref 150–400)
RBC: 2.48 MIL/uL — ABNORMAL LOW (ref 3.87–5.11)
RDW: 15.1 % (ref 11.5–15.5)
WBC: 8.2 10*3/uL (ref 4.0–10.5)

## 2014-06-05 NOTE — Progress Notes (Signed)
PULMONARY / CRITICAL CARE MEDICINE   Name: Brittney Thomas MRN: [id number] DOB: October 14, 1932    ADMISSION DATE:  05/27/2014   REFERRING MD :  EDP  CHIEF COMPLAINT: Dizziness  INITIAL PRESENTATION: Shock/dizziness  STUDIES:  11/27  CTabd/pelvis >> large rectus sheath hematoma 11/28  Repeat Ct abd >> increasing size rectus sheath hematoma, extending now into retroperitoneal space 11/30  ECHO >> LVEF 60-65%, mild MR, mild mitral calcification, LAE mod-severe dilated, RA severely dilated, mod-severe TR, PA systolic estimated 76EGBT 11/30  CT head >> NAICP 12/01  VQ >> low probability for PE, heterogenous ventilation suggestive of underlying lung disease, cardiomegaly  SIGNIFICANT EVENTS: 11/27  Presented to ED in shock; received 5 U PRBC between 11/27 and 11/29 11/30  improved hemodynamics, renal function, still on pressors 12/2 off pressors, transfused 1 U PRBC  SUBJECTIVE:   No new issues overnight.  Pt has no new complaints.  Her family is looking at [facility] today.   VITAL SIGNS: Temp:  [98 F (36.7 C)-98.4 F (36.9 C)] 98.4 F (36.9 C) (12/06 0542) Pulse Rate:  [98-105] 99 (12/06 1017) Resp:  [18] 18 (12/06 0542) BP: (105-129)/(58-72) 105/65 mmHg (12/06 1017) SpO2:  [94 %-100 %] 96 % (12/06 1017) Weight:  [77.8 kg (171 lb 8.3 oz)-83.1 kg (183 lb 3.2 oz)] 83.1 kg (183 lb 3.2 oz) (12/06 0645)   HEMODYNAMICS:   VENTILATOR SETTINGS:   INTAKE / OUTPUT:  Intake/Output Summary (Last 24 hours) at 06/05/14 1443 Last data filed at 06/05/14 1257  Gross per 24 hour  Intake    440 ml  Output      3 ml  Net    437 ml    PHYSICAL EXAMINATION: Gen: overweight female, in nad HEENT: nose with d/c, no LN or TMG PULM: mild basilar crackles, no wheezing CV: Irreg irreg, systolic murmur Ab: BS+, soft, no pain on palpation Ext: warm, edema in legs bilaterally Neuro: alert, moves all 4.   LABS:  CBC  Recent Labs Lab 06/03/14 0435 06/04/14 0440 06/05/14 0350  WBC 7.3 9.2 8.2   HGB 7.7* 7.7* 7.8*  HCT 24.5* 24.5* 24.8*  PLT 226 267 294   Coag's No results for input(s): APTT, INR in the last 168 hours. BMET  Recent Labs Lab 06/03/14 0435 06/04/14 0440 06/05/14 0350  NA 138 140 137  K 5.1 5.3 5.3  CL 104 104 100  CO2 28 29 30   BUN 20 20 20   CREATININE 0.72 0.71 0.72  GLUCOSE 92 90 93   Electrolytes  Recent Labs Lab 06/03/14 0435 06/04/14 0440 06/05/14 0350  CALCIUM 8.6 8.7 8.7     Sepsis Markers No results for input(s): LATICACIDVEN, PROCALCITON, O2SATVEN in the last 168 hours. ABG No results for input(s): PHART, PCO2ART, PO2ART in the last 168 hours. Liver Enzymes  Recent Labs Lab 06/02/14 0600  AST 19  ALT 59*  ALKPHOS 111  BILITOT 0.7  ALBUMIN 2.5*   Cardiac Enzymes  Recent Labs Lab 06/01/14 1015  TROPONINI <0.30   Glucose No results for input(s): GLUCAP in the last 168 hours.  Imaging No results found.   ASSESSMENT / PLAN:  PULMONARY OETT N/A A: Elevated PA pressure on echo suggestive of pulmonary hypertension, source unclear.  PE ruled out with low probability VQ on 12/1; WHO group 1 or 2 or 3 pulmonary hypertension unlikely based on history. Metastatic adenocarcinoma of lung, never smoker   P:   O2 as needed for O2 > 92% Pulmonary hygiene  Assess  for ambulatory O2 needs prior to discharge.  Anticipate d/c to SNF in near future.   CARDIOVASCULAR L [place] CVL 11/28 >>  A:  Hemorrhagic Shock resolved  Chronic anticoagulation on hold due to bleed Chronic AFib P:  Added back dig 12/2, increased to home dose 12/3 Add back coreg 1/2 home dose 12/3 Transfuse for Hgb < 7gm/dL Hold anticoagulants    RENAL A:   AKI> improving Hyperkalemia 11/30 > still at upper limit of range. P:   Monitor BMET and UOP Replace electrolytes as needed   HEMATOLOGIC A:  Blood loss anemia > retroperitoneal bleed, rectus sheath bleed; stable  Supratherapeutic lovenox dosing in setting of renal failure Superficial thrombus  LLE P:   Trend CBC.  Is stable today. Continue sub q heparin, monitor for bleeding carefully, would not fully anticoagulate at this time Hgb transfusion goal Hgb > 7.0  Hold IVC filter in absence DVT (only superficial LLE clot)

## 2014-06-05 NOTE — Clinical Social Work Note (Signed)
CSW left a message for pt's daughter this afternoon asking if she had made the final decision but she did not call CSW back  CSW then met with pt at bedside to discuss SNF choice.  Pt stated that she had spoken with her daughter last night who had checked out the SNF list that CSW had provided yesterday morning and they liked [facility].    Pt stated that there might have been one other they liked but she did not remember.  CSW will follow up with pt's daughter in the morning to clarify placement and arrange for SNF placement and discharge

## 2014-06-05 NOTE — Plan of Care (Signed)
Problem: Phase II Progression Outcomes Goal: IV changed to normal saline lock Outcome: Progressing  Comments:  IV saline locked

## 2014-06-06 DIAGNOSIS — I824Y2 Acute embolism and thrombosis of unspecified deep veins of left proximal lower extremity: Secondary | ICD-10-CM

## 2014-06-06 MED ORDER — ENSURE COMPLETE PO LIQD: 237.0000 mL | Freq: Two times a day (BID) | ORAL | Status: DC

## 2014-06-06 MED ORDER — CARVEDILOL 3.125 MG PO TABS: 3.1250 mg | ORAL_TABLET | Freq: Two times a day (BID) | ORAL | Status: DC

## 2014-06-06 NOTE — Progress Notes (Signed)
Gave report to Caryl Pina, Therapist, sports at [facility]. Left number if she had additional questions.

## 2014-06-06 NOTE — Progress Notes (Signed)
Physical Therapy Treatment Patient Details Name: Brittney Thomas MRN: [id number] DOB: 1932/11/04 Today's Date: 06/06/2014    History of Present Illness 80 yo WF dx with adenocarcinoma of lung, hx CAF/DVT and was seen in Oncology clinic 11/25 and was given Granix injection for neutropenia. Family reports steady decline with dizziness and lethargy. She presents to [facility] ED 11/27 and was found to have hemoglobin of 5.2 and exam reveals findings consistent with rectal sheath hematoma. Her shock is complicated by bradycardia     PT Comments    **Patient feels well, glad to be in recliner. Continues to be very weak. HR 121, sats 93% on 2 l,  Follow Up Recommendations  SNF;Supervision/Assistance - 24 hour     Equipment Recommendations  None recommended by PT    Recommendations for Other Services       Precautions / Restrictions Precautions Precautions: Fall Precaution Comments: needs O2, monio HR Restrictions Weight Bearing Restrictions: No    Mobility  Bed Mobility   Bed Mobility: Supine to Sit     Supine to sit: Mod assist;HOB elevated     General bed mobility comments: extra time , use of bed pad to slide  hips to edge,  rest breaks to catch breath  Transfers Overall transfer level: Needs assistance Equipment used: Rolling walker (2 wheeled) Transfers: Stand Pivot Transfers;Sit to/from Stand Sit to Stand: Mod assist;From elevated surface Stand pivot transfers: Mod assist;From elevated surface       General transfer comment: stiood x 3 for pericare and change Allevyn , extra time to pivot to recliner, decreased steps on L leg,   Ambulation/Gait                 Stairs            Wheelchair Mobility    Modified Rankin (Stroke Patients Only)       Balance                                    Cognition Arousal/Alertness: Awake/alert                          Exercises      General Comments        Pertinent Vitals/Pain  Pain Assessment: No/denies pain    Home Living                      Prior Function            PT Goals (current goals can now be found in the care plan section) Progress towards PT goals: Progressing toward goals    Frequency  Min 3X/week    PT Plan Current plan remains appropriate    Co-evaluation             End of Session   Activity Tolerance: Patient tolerated treatment well Patient left: in chair;with call bell/phone within reach;with nursing/sitter in room     Time: 1018-1040 PT Time Calculation (min) (ACUTE ONLY): 22 min  Charges:  $Therapeutic Activity: 8-22 mins                    G Codes:      [name] 06/06/2014, 10:45 AM [name] PT [phone]

## 2014-06-06 NOTE — Progress Notes (Signed)
PULMONARY / CRITICAL CARE MEDICINE   Name: Brittney Thomas MRN: [id number] DOB: 04/25/1933    ADMISSION DATE:  05/27/2014   REFERRING MD :  EDP  CHIEF COMPLAINT: Dizziness  INITIAL PRESENTATION: Shock/dizziness  STUDIES:  11/27  CTabd/pelvis >> large rectus sheath hematoma 11/28  Repeat Ct abd >> increasing size rectus sheath hematoma, extending now into retroperitoneal space 11/30  ECHO >> LVEF 60-65%, mild MR, mild mitral calcification, LAE mod-severe dilated, RA severely dilated, mod-severe TR, PA systolic estimated [id number] 11/30  CT head >> NAICP 12/01  VQ >> low probability for PE, heterogenous ventilation suggestive of underlying lung disease, cardiomegaly  SIGNIFICANT EVENTS: 11/27  Presented to ED in shock; received 5 U PRBC between 11/27 and 11/29 11/30  improved hemodynamics, renal function, still on pressors 12/2 off pressors, transfused 1 U PRBC L [place] CVL 11/28 >> 12/7  SUBJECTIVE:   Ready for SNF  VITAL SIGNS: Temp:  [98.2 F (36.8 C)-99 F (37.2 C)] 98.9 F (37.2 C) (12/07 0554) Pulse Rate:  [104-113] 113 (12/07 0554) Resp:  [16] 16 (12/07 0554) BP: (119-125)/(68-70) 125/68 mmHg (12/07 0554) SpO2:  [82 %-98 %] 96 % (12/07 0554) Weight:  [82.7 kg (182 lb 5.1 oz)] 82.7 kg (182 lb 5.1 oz) (12/07 0554)  1 liter  HEMODYNAMICS:   VENTILATOR SETTINGS:   INTAKE / OUTPUT:  Intake/Output Summary (Last 24 hours) at 06/06/14 1025 Last data filed at 06/06/14 2979  Gross per 24 hour  Intake    270 ml  Output      1 ml  Net    269 ml    PHYSICAL EXAMINATION: Gen: 80 year old female, in nad HEENT: nose with d/c, no LN or TMG PULM: decreased bases. No accessory muscle use  CV: Irreg irreg, systolic murmur Ab: BS+, soft, no pain on palpation Ext: warm, edema in legs bilaterally Neuro: alert, moves all 4.   LABS:  CBC  Recent Labs Lab 06/03/14 0435 06/04/14 0440 06/05/14 0350  WBC 7.3 9.2 8.2  HGB 7.7* 7.7* 7.8*  HCT 24.5* 24.5* 24.8*  PLT 226 267 294    Coag's No results for input(s): APTT, INR in the last 168 hours. BMET  Recent Labs Lab 06/03/14 0435 06/04/14 0440 06/05/14 0350  NA 138 140 137  K 5.1 5.3 5.3  CL 104 104 100  CO2 28 29 30   BUN 20 20 20   CREATININE 0.72 0.71 0.72  GLUCOSE 92 90 93   Electrolytes  Recent Labs Lab 06/03/14 0435 06/04/14 0440 06/05/14 0350  CALCIUM 8.6 8.7 8.7     Sepsis Markers No results for input(s): LATICACIDVEN, PROCALCITON, O2SATVEN in the last 168 hours. ABG No results for input(s): PHART, PCO2ART, PO2ART in the last 168 hours. Liver Enzymes  Recent Labs Lab 06/02/14 0600  AST 19  ALT 59*  ALKPHOS 111  BILITOT 0.7  ALBUMIN 2.5*   Cardiac Enzymes  Recent Labs Lab 06/01/14 1015  TROPONINI <0.30   Glucose No results for input(s): GLUCAP in the last 168 hours.  Imaging No results found.   ASSESSMENT / PLAN:  Blood loss anemia > retroperitoneal bleed, rectus sheath bleed; stable  Supratherapeutic lovenox dosing in setting of renal failure Superficial thrombus LLE Plan:   Trend CBC.  Is stable today. Continue sub q heparin, monitor for bleeding carefully, would not fully anticoagulate at this time Hgb transfusion goal Hgb > 7.0  Hold IVC filter in absence DVT (only superficial LLE clot)  Metastatic adenocarcinoma of lung, never  smoker  .  Plan:   O2 as needed for O2 > 92% Pulmonary hygiene.  Anticipate d/c to SNF in near future.    Chronic AFib Plan:  Added back dig 12/2, increased to home dose 12/3 Add back coreg 1/2 home dose 12/3 Transfuse for Hgb < 7gm/dL No further anticoagulation due to life threatening bleed    Deconditioning Plan: PT/OT and awaiting SNF  Erick Colace ACNP-BC [place] Pager # [phone] OR # [phone] if no answer   Care during the described time interval was provided by me and/or other providers on the critical care team.  I have reviewed this patient's available data, including medical  history, events of note, physical examination and test results as part of my evaluation  Rigoberto Noel MD

## 2014-06-06 NOTE — Progress Notes (Signed)
CARE MANAGEMENT NOTE 06/06/2014  Patient:  LONDIN, ANTONE   Account Number:  [id number]  Date Initiated:  05/30/2014  Documentation initiated by:  DAVIS,RHONDA  Subjective/Objective Assessment:   80 yo WF dx adenocarcinoma of lung via FOB in 10/15. She is on LMWH injections for hx CAF/DVT & was seen in Oncology clinic 11/25 and was given   Granix injection for neutropenia. Family reports steady decline with dizziness and lethgary.     Action/Plan:   tbd   Anticipated DC Date:  06/03/2014   Anticipated DC Plan:  SKILLED NURSING FACILITY  In-house referral  Clinical Social Worker      DC Planning Services  CM consult      Choice offered to / List presented to:             Status of service:  Completed, signed off Medicare Important Message given?  YES (If response is "NO", the following Medicare IM given date fields will be blank) Date Medicare IM given:  06/03/2014 Medicare IM given by:  Edwyna Shell Date Additional Medicare IM given:  06/06/2014 Additional Medicare IM given by:  Edwyna Shell  Discharge Disposition:  [facility]  Per UR Regulation:  Reviewed for med. necessity/level of care/duration of stay  If discussed at [place] of Stay Meetings, dates discussed:    Comments:  06/06/14 Edwyna Shell RN BSN CM [phone]  No orders, no CM needs [id number] Eldridge Dace, [place], [place] [phone] Chart Reviewed. Discharge needs at time of review: None present will follow for needs. patient being moved from sdu to [place] floor [id number].  [id number] Rosana Hoes, RN, BSN, CCM Chart reviewed. Discharge needs and patient's stay to be reviewed and followed by case manager.

## 2014-06-06 NOTE — Discharge Summary (Signed)
Physician Discharge Summary       Patient ID: Brittney Thomas MRN: [id number] DOB/AGE: 12/03/1932 80 y.o.  Admit date: 05/27/2014 Discharge date: 06/06/2014  Discharge Diagnoses: Metastatic adenocarcinoma of lung, never smoker  Hemorrhagic Shock (resolved) Chronic anticoagulation on hold due to bleed Chronic AFib AKI (resolved) Hyperkalemia Blood loss anemia > retroperitoneal bleed, rectus sheath bleed  Supratherapeutic lovenox dosing in setting of renal failure Superficial thrombus LLE Detailed Hospital Course:  80 yo WF dx with adenocarcinoma of lung via FOB in 10/15. She is on LMWH injections for hx CAF and superficial DVT;  was seen in Oncology clinic 11/25 and was given Granix injection for neutropenia. Family reports steady decline with dizziness and lethargy. She presents to [facility] ED 11/27 and was found to have hemoglobin of 5.2 and exam reveals findings consistent with rectal sheath hematoma. Her shock is complicated by bradycardia from beta blockers. PCCM  Admitted to ICU. CT abd/pelvis showed a large rectus sheath hematoma. Treatment included transfusion of blood and blood products, vasoactive infusions w/ neosynephrine and supportive care. On 11/28 her BP needs increased and repeat CT abd confirmed worsening hematoma. On 11/30 hgb had stabilized but she remained pressor dependant so ECHO and cortisol was ordered. Her ECHO showed LVEF 60-65%, mild MR, mild mitral calcification, LAE mod-severe dilated, RA severely dilated, mod-severe TR, PA systolic estimated [id number]. Because of this VQ scan was obtained and showed low probability of PE. She was weaned off pressors as of 12/1. Got an additional unit of blood that day. Ultimately we felt that the elevated PAP on echo did not represent true pulmonary artery HTN. She was monitored over the weekend and had no additional evidence of bleeding and was deemed ready for d/c to SNF with the following plan of care as outlined below.     Discharge Plan by active problems   Blood loss anemia > retroperitoneal bleed, rectus sheath bleed; stable  Superficial thrombus LLE Plan:  Don't resume home LMWH.  Mobilize   Metastatic adenocarcinoma of lung, never smoker, EGFR pos >s/p only one cycle of Chemo.  .  Plan:  O2 as needed for O2 > 92% Pulmonary hygiene.  Heme/onc f/u after improved functional status . Will need to arrange w/ Dr [place] [place] office   Chronic AFib HTN Plan:  Resumed coreg at 1/2 dose; continue current digoxin dosing No further therapeutic anticoagulation due to life threatening bleed  Was on diamox 253m q evening and lasix 20 mg every morning. Also Lisinopril 10 mg daily. These will need to be re-evaluated in the next 7 days to see if appropriate to resume any of her prior meds.   Deconditioning Plan: PT/OT and awaiting SNF   Significant Hospital tests/ studies  11/27 CTabd/pelvis >> large rectus sheath hematoma 11/28 Repeat Ct abd >> increasing size rectus sheath hematoma, extending now into retroperitoneal space 11/30 ECHO >> LVEF 60-65%, mild MR, mild mitral calcification, LAE mod-severe dilated, RA severely dilated, mod-severe TR, PA systolic estimated [id number] CT head >> NAICP 12/01 VQ >> low probability for PE, heterogenous ventilation suggestive of underlying lung disease, cardiomegaly  Discharge Exam: BP 125/68 mmHg  Pulse 113  Temp(Src) 98.9 F (37.2 C) (Oral)  Resp 16  Ht 5' 8"  (1.727 m)  Wt 82.7 kg (182 lb 5.1 oz)  BMI 27.73 kg/m2  SpO2 96%   PHYSICAL EXAMINATION: Gen: 80year old female, in nad HEENT: nose with d/c, no LN or TMG PULM: decreased bases. No accessory muscle use  CV: Irreg  irreg, systolic murmur Ab: BS+, soft, no pain on palpation Ext: warm, edema in legs bilaterally Neuro: alert, moves all 4.   Labs at discharge Lab Results  Component Value Date   CREATININE 0.72 06/05/2014   BUN 20 06/05/2014   NA 137 06/05/2014   K 5.3 06/05/2014     CL 100 06/05/2014   CO2 30 06/05/2014   Lab Results  Component Value Date   WBC 8.2 06/05/2014   HGB 7.8* 06/05/2014   HCT 24.8* 06/05/2014   MCV 100.0 06/05/2014   PLT 294 06/05/2014   Lab Results  Component Value Date   ALT 59* 06/02/2014   AST 19 06/02/2014   ALKPHOS 111 06/02/2014   BILITOT 0.7 06/02/2014   Lab Results  Component Value Date   INR 1.12 05/27/2014   INR 1.03 05/13/2014   INR 1.35 04/20/2014    Current radiology studies No results found.  Disposition:  01-Home or Self Care      Discharge Instructions    Diet - low sodium heart healthy    Complete by:  As directed      For home use only DME oxygen    Complete by:  As directed   Mode or (Route):  Nasal cannula  Liters per Minute:  1  Oxygen delivery system:  Gas     Increase activity slowly    Complete by:  As directed             Medication List    STOP taking these medications        acetaZOLAMIDE 250 MG tablet  Commonly known as:  DIAMOX     enoxaparin 80 MG/0.8ML injection  Commonly known as:  LOVENOX     furosemide 40 MG tablet  Commonly known as:  LASIX     lisinopril 10 MG tablet  Commonly known as:  PRINIVIL,ZESTRIL     LORazepam 1 MG tablet  Commonly known as:  ATIVAN     prochlorperazine 10 MG tablet  Commonly known as:  COMPAZINE     theophylline 200 MG 12 hr tablet  Commonly known as:  THEODUR      TAKE these medications        acetaminophen 500 MG tablet  Commonly known as:  TYLENOL  Take 500 mg by mouth every 6 (six) hours as needed (pain).     calcium-vitamin D 500-200 MG-UNIT per tablet  Commonly known as:  OSCAL WITH D  Take 1 tablet by mouth daily with breakfast.     carvedilol 3.125 MG tablet  Commonly known as:  COREG  Take 1 tablet (3.125 mg total) by mouth 2 (two) times daily with a meal.     dexamethasone 4 MG tablet  Commonly known as:  DECADRON  4 mg by mouth twice a day the day before, day of and day after the chemotherapy every 3  weeks     digoxin 0.25 MG tablet  Commonly known as:  LANOXIN  Take 0.25 mg by mouth every evening.     feeding supplement (ENSURE COMPLETE) Liqd  Take 237 mLs by mouth 2 (two) times daily between meals.     folic acid 1 MG tablet  Commonly known as:  FOLVITE  Take 1 tablet (1 mg total) by mouth daily.     levothyroxine 88 MCG tablet  Commonly known as:  SYNTHROID, LEVOTHROID  Take 88 mcg by mouth daily before breakfast.     SALMON OIL-1000 PO  Take 1 tablet  by mouth daily.         Discharged Condition: good   Signed: BABCOCK,PETE 06/06/2014, 11:03 AM  Physician Statement:   The Patient was personally examined, the discharge assessment and plan has been personally reviewed and I agree with ACNP Babcock's assessment and plan. > 30 minutes of time have been dedicated to discharge assessment, planning and discharge instructions.   Rigoberto Noel MD

## 2014-06-06 NOTE — Progress Notes (Signed)
Clinical Social Work  CSW met with patient and dtr at bedside who has chosen [facility] [employer]. CSW faxed DC summary to SNF who is agreeable to accept at 2pm. Patient and dtr request PTAR and aware of no guarantee of payment. Patient reports she is happy to DC and that dtr will bring belongings to SNF for her today. CSW prepared DC packet with FL2 and DC summary included. RN to call report to SNF. PTAR #: [id number].  CSW is signing off but available if needed.  [place], [place] [phone]

## 2014-06-07 ENCOUNTER — Telehealth: Payer: Self-pay | Admitting: Medical Oncology

## 2014-06-07 ENCOUNTER — Other Ambulatory Visit: Payer: Medicare Other

## 2014-06-07 NOTE — Telephone Encounter (Signed)
I returned Sue's call and left message to call me back

## 2014-06-14 ENCOUNTER — Other Ambulatory Visit (INDEPENDENT_AMBULATORY_CARE_PROVIDER_SITE_OTHER): Payer: Medicare Other

## 2014-06-14 ENCOUNTER — Other Ambulatory Visit: Payer: Medicare Other

## 2014-06-14 ENCOUNTER — Encounter: Payer: Self-pay | Admitting: Pulmonary Disease

## 2014-06-14 ENCOUNTER — Ambulatory Visit (INDEPENDENT_AMBULATORY_CARE_PROVIDER_SITE_OTHER): Payer: Medicare Other | Admitting: Pulmonary Disease

## 2014-06-14 ENCOUNTER — Non-Acute Institutional Stay (SKILLED_NURSING_FACILITY): Payer: Medicare Other | Admitting: Nurse Practitioner

## 2014-06-14 VITALS — BP 124/66 | HR 80 | Temp 98.4°F

## 2014-06-14 DIAGNOSIS — C3402 Malignant neoplasm of left main bronchus: Secondary | ICD-10-CM

## 2014-06-14 DIAGNOSIS — I4819 Other persistent atrial fibrillation: Secondary | ICD-10-CM

## 2014-06-14 DIAGNOSIS — R17 Unspecified jaundice: Secondary | ICD-10-CM | POA: Insufficient documentation

## 2014-06-14 DIAGNOSIS — C3490 Malignant neoplasm of unspecified part of unspecified bronchus or lung: Secondary | ICD-10-CM

## 2014-06-14 DIAGNOSIS — J9621 Acute and chronic respiratory failure with hypoxia: Secondary | ICD-10-CM | POA: Insufficient documentation

## 2014-06-14 DIAGNOSIS — I481 Persistent atrial fibrillation: Secondary | ICD-10-CM

## 2014-06-14 DIAGNOSIS — M7989 Other specified soft tissue disorders: Secondary | ICD-10-CM

## 2014-06-14 DIAGNOSIS — R600 Localized edema: Secondary | ICD-10-CM | POA: Insufficient documentation

## 2014-06-14 DIAGNOSIS — R609 Edema, unspecified: Secondary | ICD-10-CM

## 2014-06-14 DIAGNOSIS — J9611 Chronic respiratory failure with hypoxia: Secondary | ICD-10-CM

## 2014-06-14 DIAGNOSIS — D500 Iron deficiency anemia secondary to blood loss (chronic): Secondary | ICD-10-CM

## 2014-06-14 HISTORY — DX: Acute and chronic respiratory failure with hypoxia: J96.21

## 2014-06-14 LAB — CBC WITH DIFFERENTIAL/PLATELET
BASOS ABS: 0 10*3/uL (ref 0.0–0.1)
Basophils Relative: 0.1 % (ref 0.0–3.0)
EOS ABS: 0 10*3/uL (ref 0.0–0.7)
Eosinophils Relative: 0.1 % (ref 0.0–5.0)
HCT: 29.7 % — ABNORMAL LOW (ref 36.0–46.0)
HEMOGLOBIN: 9.3 g/dL — AB (ref 12.0–15.0)
Lymphs Abs: 0.5 10*3/uL — ABNORMAL LOW (ref 0.7–4.0)
MCHC: 31.4 g/dL (ref 30.0–36.0)
MCV: 101 fl — ABNORMAL HIGH (ref 78.0–100.0)
MONOS PCT: 15.5 % — AB (ref 3.0–12.0)
Monocytes Absolute: 1.6 10*3/uL — ABNORMAL HIGH (ref 0.1–1.0)
Neutro Abs: 8.4 10*3/uL — ABNORMAL HIGH (ref 1.4–7.7)
Neutrophils Relative %: 80 % — ABNORMAL HIGH (ref 43.0–77.0)
Platelets: 190 10*3/uL (ref 150.0–400.0)
RBC: 2.94 Mil/uL — AB (ref 3.87–5.11)
WBC: 10.6 10*3/uL — ABNORMAL HIGH (ref 4.0–10.5)

## 2014-06-14 LAB — COMPREHENSIVE METABOLIC PANEL
ALBUMIN: 3 g/dL — AB (ref 3.5–5.2)
ALT: 16 U/L (ref 0–35)
AST: 22 U/L (ref 0–37)
Alkaline Phosphatase: 120 U/L — ABNORMAL HIGH (ref 39–117)
BUN: 35 mg/dL — ABNORMAL HIGH (ref 6–23)
CHLORIDE: 91 meq/L — AB (ref 96–112)
CO2: 33 mEq/L — ABNORMAL HIGH (ref 19–32)
Calcium: 8.7 mg/dL (ref 8.4–10.5)
Creatinine, Ser: 1.1 mg/dL (ref 0.4–1.2)
GFR: 52.48 mL/min — AB (ref >60.00)
Glucose, Bld: 105 mg/dL — ABNORMAL HIGH (ref 70–99)
POTASSIUM: 5.8 meq/L — AB (ref 3.5–5.1)
Sodium: 130 mEq/L — ABNORMAL LOW (ref 135–145)
TOTAL PROTEIN: 6.3 g/dL (ref 6.0–8.3)
Total Bilirubin: 1 mg/dL (ref 0.2–1.2)

## 2014-06-14 NOTE — Assessment & Plan Note (Signed)
She has significant leg swelling which today in clinic is massive. This has led to significant weight gain and inability to walk.  Plan: -Restart Lasix, would titrate as needed with a goal weight of 160 pounds

## 2014-06-14 NOTE — Progress Notes (Signed)
Subjective:    Patient ID: JENASCIA BUMPASS, female    DOB: 1932/12/30, 80 y.o.   MRN: [id number]  Synopsis: Diagnosed with stage IV adenocarcinoma of the lung in the fall of 2015. Has a history of A. fib and a superficial thrombophlebitis. Was hospitalized in November 2015 for severe bleeding into a rectus sheath hematoma, related to Lovenox use.  HPI  Chief Complaint  Patient presents with  . Hospitalization Follow-up    Was admitted at [facility] until 06/06/14.  Pt c/o sob after eating, sometimes prod cough with clear mucus.    Rosemae was discharged home and went to a nursing facility.  She cannot walk due ot significant leg swelling.  She has not been taking lasix or blood thinners. She can't pick her legs up.  She said that she left the hospital at 178lbs and several days later she was as high as 186lbs.  She is very upset about this.  She wants to restart the lasix.  She plans to see Dr. Mora Appl on Thursday.  Apparently he plans targeted therapy and they plan to switch to this soon.  She has not experienced significant bleeding.  Past Medical History  Diagnosis Date  . Ulcer 06-08-13    Left medial ankle  . Hypertension   . Thyroid disease     Hypo-Thyroidism  . Esophageal reflux   . COPD (chronic obstructive pulmonary disease)   . Peripheral vascular disease   . Hypothyroidism   . Insomnia     takes lorazepam  . Arthritis   . Dysrhythmia   . DVT (deep venous thrombosis) 2008  . Paroxysmal a-fib 2008  . PE (pulmonary thromboembolism) 2008  . Wears dentures      Review of Systems  Constitutional: Positive for fatigue. Negative for fever and chills.  HENT: Negative for postnasal drip, rhinorrhea and sinus pressure.   Respiratory: Positive for shortness of breath. Negative for cough and stridor.   Cardiovascular: Negative for chest pain, palpitations and leg swelling.       Objective:   Physical Exam Filed Vitals:   06/14/14 1036  BP: 124/66  Pulse: 80  Temp: 98.4 F (36.9  C)  TempSrc: Oral  SpO2: 97%   Gen: chronically ill appearing, no acute distress HEENT: NCAT, PERRL, EOMi, OP clear,  PULM: Crackles in bases CV: Irreg irreg systolic murmur, no JVD AB: BS+, soft, nontender, no hsm Ext: warm, massive pitting edema, no clubbing, no cyanosis Derm: no rash or skin breakdown, jaundice noted Neuro: A&Ox4, CN II-XII intact, strength 5/5 in all 4 extremities  The discharge summary and the records from her recent hospital summary were reviewed by me today in clinic      Assessment & Plan:   Adenocarcinoma, lung She has stage IV adenocarcinoma. Apparently her gene mutation analysis suggest that this condition could be treated with a targeted therapy.  Her overall general health status at this point is very poor and she has clearly deteriorated since I first knew her at the time of her diagnosis. She is much more emaciated at this point and has significant swelling in her legs. I fear that her overall prognosis is poor.  Plan: -Continue follow-up with oncology later this week -Follow-up with me in one month's time, if needed I am happy to be available for a hospice referral  Blood loss anemia Her rectus sheath hematoma has resolved. However, she was anemic at the time of hospital discharge multiple transfusions. I will check a CBC today  to make sure that she does not need another blood transfusion. At this point I do not see evidence of further bleeding.  Atrial fibrillation Because of the severe bleeding related to the rectus sheath hematoma I do not feel that it is safe to restart anticoagulation.  Leg swelling She has significant leg swelling which today in clinic is massive. This has led to significant weight gain and inability to walk.  Plan: -Restart Lasix, would titrate as needed with a goal weight of 160 pounds  Jaundice She appears jaundice on physical exam today. I worry that this could represent cholestasis related to her  malignancy.  Plan: -Check bilirubin with liver function testing today.  Chronic respiratory failure It is not clear to me why she requires 2 L of oxygen continuously but I suspect there may be some pulmonary edema. She is massively volume overloaded today. She did not have underlying interstitial lung disease or lung disease apart from the cancer.  Plan: -Continue 2 L of oxygen continuously for now -Lasix    Updated Medication List Outpatient Encounter Prescriptions as of 06/14/2014  Medication Sig  . acetaminophen (TYLENOL) 500 MG tablet Take 500 mg by mouth every 6 (six) hours as needed (pain).  . calcium-vitamin D (OSCAL WITH D) 500-200 MG-UNIT per tablet Take 1 tablet by mouth daily with breakfast.   . carvedilol (COREG) 3.125 MG tablet Take 1 tablet (3.125 mg total) by mouth 2 (two) times daily with a meal.  . dexamethasone (DECADRON) 4 MG tablet 4 mg by mouth twice a day the day before, day of and day after the chemotherapy every 3 weeks (Patient taking differently: Take 4 mg by mouth See admin instructions. 4 mg by mouth twice a day the day before, day of and day after the chemotherapy every 3 weeks)  . digoxin (LANOXIN) 0.25 MG tablet Take 0.25 mg by mouth every evening.   . feeding supplement, ENSURE COMPLETE, (ENSURE COMPLETE) LIQD Take 237 mLs by mouth 2 (two) times daily between meals.  . folic acid (FOLVITE) 1 MG tablet Take 1 tablet (1 mg total) by mouth daily.  [name] levothyroxine (SYNTHROID, LEVOTHROID) 88 MCG tablet Take 88 mcg by mouth daily before breakfast.  . Omega-3 Fatty Acids (SALMON OIL-1000 PO) Take 1 tablet by mouth daily.

## 2014-06-14 NOTE — Assessment & Plan Note (Signed)
It is not clear to me why she requires 2 L of oxygen continuously but I suspect there may be some pulmonary edema. She is massively volume overloaded today. She did not have underlying interstitial lung disease or lung disease apart from the cancer.  Plan: -Continue 2 L of oxygen continuously for now -Lasix

## 2014-06-14 NOTE — Progress Notes (Signed)
Patient ID: Brittney Thomas, female   DOB: [birth year], 80 y.o.   MRN: [id number]    Nursing Home Location:  [place] of Service: SNF (31)  PCP: [name]  Allergies  Allergen Reactions  . Poison Ivy Extract [Extract Of Poison Ivy] Rash  . Sulfa Antibiotics Rash  . Sulfacetamide Sodium Rash    Chief Complaint  Patient presents with  . Acute Visit    swelling    HPI:  Patient is a 80 y.o. female seen today at [facility] who was seen today at the request of nursing due to worsening LE edema and weight gain since admission. Pt with pmh of adenocarcinoma of lung via FOB in 10/15. She is on LMWH injections for hx CAF and superficial DVT; Pt was hospitalized due to dizziness and lethargy and found to have a hgb of 5.2 and CT abd/pelvis showed a large rectus sheath hematoma. Pt was treated and transfused and discharged to SNF for ongoing supportive care and rehab. Pt reports she had been on lasix in the past but was not restarted after hospitalization. Now with worsening edema that is painful and making it difficult to move and ambulate.   Review of Systems:  Review of Systems  Constitutional: Positive for activity change, fatigue and unexpected weight change (weight gain). Negative for appetite change.  HENT: Negative for congestion and hearing loss.   Eyes: Negative.   Respiratory: Positive for shortness of breath. Negative for cough.   Cardiovascular: Positive for leg swelling (significant ). Negative for chest pain and palpitations.  Gastrointestinal: Negative for abdominal pain, diarrhea and constipation.  Genitourinary: Negative for dysuria and difficulty urinating.  Musculoskeletal: Negative for myalgias and arthralgias.  Skin: Negative for color change and wound.  Neurological: Positive for weakness. Negative for dizziness.  Psychiatric/Behavioral: Negative for behavioral problems, confusion and agitation.    Past Medical History    Diagnosis Date  . Ulcer 06-08-13    Left medial ankle  . Hypertension   . Thyroid disease     Hypo-Thyroidism  . Esophageal reflux   . COPD (chronic obstructive pulmonary disease)   . Peripheral vascular disease   . Hypothyroidism   . Insomnia     takes lorazepam  . Arthritis   . Dysrhythmia   . DVT (deep venous thrombosis) 2008  . Paroxysmal a-fib 2008  . PE (pulmonary thromboembolism) 2008  . Wears dentures    Past Surgical History  Procedure Laterality Date  . Tonsillectomy    . Eye surgery Right Aug. 2014    Cataract  . Eye surgery Left Oct. 2014    Cataract  . Abdominal hysterectomy  1984    Total  . Video bronchoscopy with endobronchial navigation N/A 04/20/2014    Procedure: VIDEO BRONCHOSCOPY WITH ENDOBRONCHIAL NAVIGATION;  Surgeon: Collene Gobble, MD;  Location: [facility];  Service: Thoracic;  Laterality: N/A;  . Video bronchoscopy with endobronchial ultrasound N/A 04/20/2014    Procedure: VIDEO BRONCHOSCOPY WITH ENDOBRONCHIAL ULTRASOUND;  Surgeon: Collene Gobble, MD;  Location: [facility];  Service: Thoracic;  Laterality: N/A;   Social History:   reports that she has never smoked. She has never used smokeless tobacco. She reports that she does not drink alcohol or use illicit drugs.  Family History  Problem Relation Age of Onset  . Heart disease Maternal Grandmother   . Cancer Brother     kidney  . Stroke Brother     Medications: Patient's Medications  New Prescriptions   No medications on file  Previous Medications   ACETAMINOPHEN (TYLENOL) 500 MG TABLET    Take 500 mg by mouth every 6 (six) hours as needed (pain).   CALCIUM-VITAMIN D (OSCAL WITH D) 500-200 MG-UNIT PER TABLET    Take 1 tablet by mouth daily with breakfast.    CARVEDILOL (COREG) 3.125 MG TABLET    Take 1 tablet (3.125 mg total) by mouth 2 (two) times daily with a meal.   DEXAMETHASONE (DECADRON) 4 MG TABLET    4 mg by mouth twice a day the day before, day of and day after the chemotherapy every 3  weeks   DIGOXIN (LANOXIN) 0.25 MG TABLET    Take 0.25 mg by mouth every evening.    FEEDING SUPPLEMENT, ENSURE COMPLETE, (ENSURE COMPLETE) LIQD    Take 237 mLs by mouth 2 (two) times daily between meals.   FOLIC ACID (FOLVITE) 1 MG TABLET    Take 1 tablet (1 mg total) by mouth daily.   LEVOTHYROXINE (SYNTHROID, LEVOTHROID) 88 MCG TABLET    Take 88 mcg by mouth daily before breakfast.   OMEGA-3 FATTY ACIDS (SALMON OIL-1000 PO)    Take 1 tablet by mouth daily.   Modified Medications   No medications on file  Discontinued Medications   No medications on file     Physical Exam: Filed Vitals:   06/14/14 1712  BP: 102/75  Pulse: 76  Temp: 97 F (36.1 C)  Resp: 20    Physical Exam  Constitutional: She is oriented to person, place, and time. She appears well-developed and well-nourished. No distress.  HENT:  Head: Normocephalic and atraumatic.  Mouth/Throat: Oropharynx is clear and moist. No oropharyngeal exudate.  Eyes: Conjunctivae are normal. Pupils are equal, round, and reactive to light.  Neck: Normal range of motion. Neck supple.  Cardiovascular: Normal rate.  An irregularly irregular rhythm present.  Murmur heard. Pulmonary/Chest: Effort normal. She has decreased breath sounds.  Abdominal: Soft. Bowel sounds are normal.  Musculoskeletal: She exhibits edema (3+ bilaterally) and tenderness.  Neurological: She is alert and oriented to person, place, and time.  Skin: Skin is warm and dry. She is not diaphoretic.  Psychiatric: She has a normal mood and affect.    Labs reviewed: Basic Metabolic Panel:  Recent Labs  05/27/14 1809  06/04/14 0440 06/05/14 0350 06/14/14 1137  NA 135*  < > 140 137 130*  K 4.5  < > 5.3 5.3 5.8*  CL 102  < > 104 100 91*  CO2 21  < > 29 30 33*  GLUCOSE 120*  < > 90 93 105*  BUN 57*  < > 20 20 35*  CREATININE 3.67*  < > 0.71 0.72 1.1  CALCIUM 8.0*  < > 8.7 8.7 8.7  MG 1.9  --   --   --   --   PHOS 3.3  --   --   --   --   < > = values in  this interval not displayed. Liver Function Tests:  Recent Labs  05/29/14 0745 06/02/14 0600 06/14/14 1137  AST 57* 19 22  ALT 169* 59* 16  ALKPHOS 86 111 120*  BILITOT 0.8 0.7 1.0  PROT 5.6* 5.7* 6.3  ALBUMIN 2.7* 2.5* 3.0*   No results for input(s): LIPASE, AMYLASE in the last 8760 hours. No results for input(s): AMMONIA in the last 8760 hours. CBC:  Recent Labs  06/01/14 0540  06/03/14 0435 06/04/14 0440 06/05/14 0350 06/14/14 1137  WBC 7.0  < > 7.3 9.2 8.2 10.6*  NEUTROABS 4.3  --  4.8  --   --  8.4*  HGB 6.8*  < > 7.7* 7.7* 7.8* 9.3*  HCT 20.2*  < > 24.5* 24.5* 24.8* 29.7*  MCV 92.7  < > 96.8 98.4 100.0 101.0*  PLT 136*  < > 226 267 294 190.0  < > = values in this interval not displayed. TSH: No results for input(s): TSH in the last 8760 hours. A1C: No results found for: HGBA1C Lipid Panel: No results for input(s): CHOL, HDL, LDLCALC, TRIG, CHOLHDL, LDLDIRECT in the last 8760 hours.   Assessment/Plan 1. Edema -pt seen today at pulmonary office and lasix 40 mg daily was order and to increase to BID -will start lasix 40 mg daily and monitor  -follow up BMP in 3 days   2. Adenocarcinoma, lung, unspecified laterality -following with pulmonary and oncology, has appt with oncologist in 2 days.

## 2014-06-14 NOTE — Assessment & Plan Note (Signed)
She has stage IV adenocarcinoma. Apparently her gene mutation analysis suggest that this condition could be treated with a targeted therapy.  Her overall general health status at this point is very poor and she has clearly deteriorated since I first knew her at the time of her diagnosis. She is much more emaciated at this point and has significant swelling in her legs. I fear that her overall prognosis is poor.  Plan: -Continue follow-up with oncology later this week -Follow-up with me in one month's time, if needed I am happy to be available for a hospice referral

## 2014-06-14 NOTE — Patient Instructions (Signed)
Take lasix 40mg  daily We will call you with the results of today's labs We will see you back in January or sooner if needed

## 2014-06-14 NOTE — Assessment & Plan Note (Signed)
Because of the severe bleeding related to the rectus sheath hematoma I do not feel that it is safe to restart anticoagulation.

## 2014-06-14 NOTE — Assessment & Plan Note (Signed)
She appears jaundice on physical exam today. I worry that this could represent cholestasis related to her malignancy.  Plan: -Check bilirubin with liver function testing today.

## 2014-06-14 NOTE — Assessment & Plan Note (Signed)
Her rectus sheath hematoma has resolved. However, she was anemic at the time of hospital discharge multiple transfusions. I will check a CBC today to make sure that she does not need another blood transfusion. At this point I do not see evidence of further bleeding.

## 2014-06-16 ENCOUNTER — Telehealth: Payer: Self-pay | Admitting: Physician Assistant

## 2014-06-16 ENCOUNTER — Ambulatory Visit (HOSPITAL_BASED_OUTPATIENT_CLINIC_OR_DEPARTMENT_OTHER): Payer: Medicare Other | Admitting: Physician Assistant

## 2014-06-16 ENCOUNTER — Encounter: Payer: Self-pay | Admitting: Physician Assistant

## 2014-06-16 ENCOUNTER — Other Ambulatory Visit (HOSPITAL_BASED_OUTPATIENT_CLINIC_OR_DEPARTMENT_OTHER): Payer: Medicare Other

## 2014-06-16 VITALS — BP 98/57 | HR 66 | Temp 97.9°F | Resp 19 | Ht 68.0 in | Wt 189.8 lb

## 2014-06-16 DIAGNOSIS — C3400 Malignant neoplasm of unspecified main bronchus: Secondary | ICD-10-CM

## 2014-06-16 DIAGNOSIS — C3402 Malignant neoplasm of left main bronchus: Secondary | ICD-10-CM

## 2014-06-16 DIAGNOSIS — C3412 Malignant neoplasm of upper lobe, left bronchus or lung: Secondary | ICD-10-CM

## 2014-06-16 LAB — COMPREHENSIVE METABOLIC PANEL (CC13)
ALK PHOS: 139 U/L (ref 40–150)
ALT: 16 U/L (ref 0–55)
AST: 20 U/L (ref 5–34)
Albumin: 2.8 g/dL — ABNORMAL LOW (ref 3.5–5.0)
Anion Gap: 8 mEq/L (ref 3–11)
BILIRUBIN TOTAL: 1.09 mg/dL (ref 0.20–1.20)
BUN: 32.8 mg/dL — ABNORMAL HIGH (ref 7.0–26.0)
CO2: 34 mEq/L — ABNORMAL HIGH (ref 22–29)
Calcium: 8.7 mg/dL (ref 8.4–10.4)
Chloride: 89 mEq/L — ABNORMAL LOW (ref 98–109)
Creatinine: 0.9 mg/dL (ref 0.6–1.1)
EGFR: 65 mL/min/{1.73_m2} — AB (ref >90)
GLUCOSE: 96 mg/dL (ref 70–140)
Potassium: 5.2 mEq/L — ABNORMAL HIGH (ref 3.5–5.1)
SODIUM: 132 meq/L — AB (ref 136–145)
TOTAL PROTEIN: 6.1 g/dL — AB (ref 6.4–8.3)

## 2014-06-16 LAB — CBC WITH DIFFERENTIAL/PLATELET
BASO%: 0.4 % (ref 0.0–2.0)
BASOS ABS: 0 10*3/uL (ref 0.0–0.1)
EOS%: 0.3 % (ref 0.0–7.0)
Eosinophils Absolute: 0 10*3/uL (ref 0.0–0.5)
HCT: 29.4 % — ABNORMAL LOW (ref 34.8–46.6)
HGB: 8.9 g/dL — ABNORMAL LOW (ref 11.6–15.9)
LYMPH#: 0.5 10*3/uL — AB (ref 0.9–3.3)
LYMPH%: 7.8 % — ABNORMAL LOW (ref 14.0–49.7)
MCH: 30.9 pg (ref 25.1–34.0)
MCHC: 30.3 g/dL — AB (ref 31.5–36.0)
MCV: 102.1 fL — AB (ref 79.5–101.0)
MONO#: 1.4 10*3/uL — ABNORMAL HIGH (ref 0.1–0.9)
MONO%: 20.7 % — AB (ref 0.0–14.0)
NEUT#: 4.9 10*3/uL (ref 1.5–6.5)
NEUT%: 70.8 % (ref 38.4–76.8)
NRBC: 0 % (ref 0–0)
Platelets: 185 10*3/uL (ref 145–400)
RBC: 2.88 10*6/uL — AB (ref 3.70–5.45)
RDW: 17.9 % — ABNORMAL HIGH (ref 11.2–14.5)
WBC: 6.9 10*3/uL (ref 3.9–10.3)

## 2014-06-16 MED ORDER — GEFITINIB 250 MG PO TABS: 250.0000 mg | ORAL_TABLET | Freq: Every day | ORAL | Status: DC

## 2014-06-16 NOTE — Progress Notes (Signed)
Quick Note:  lmtcb X1 ______ 

## 2014-06-16 NOTE — Patient Instructions (Signed)
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 You will be started on an oral chemotherapy drug called Iressa (gefitinib) 250 mg by mouth one hour befor or two hours after a meal Follow up in 2 weeks

## 2014-06-16 NOTE — Telephone Encounter (Signed)
Gave avs & cal for Dec. °

## 2014-06-16 NOTE — Progress Notes (Addendum)
No images are attached to the encounter. No scans are attached to the encounter. No scans are attached to the encounter. [facility] VISIT PROGRESS NOTE  Brittney Thomas [address] [place] [address]  DIAGNOSIS: Lung cancer, main bronchus   Staging form: Lung, AJCC 7th Edition     Clinical stage from 05/03/2014: Stage IV (T1a, N3, M1b) - [facility] Molecular markers positive for [id number], [id number], [id number]*  PRIOR THERAPY: None  CURRENT THERAPY: systemic chemotherapy with carboplatin for an AUC of 5 and Alimta 500 mg/m given every 3 weeks. Status post one cycle.  INTERVAL HISTORY: Brittney Thomas 80 y.o. female returns for a scheduled regular symptom management visit for followup of her recently diagnosed stage IV (T1b, N3, M1b) non-small cell lung cancer, adenocarcinoma. She status post 1 cycle of systemic chemotherapy with carboplatin and Alimta given on 05/10/2014. She has been in and out of the hospital office since that first cycle due to recurrent edema and weakness. She presents today for evaluation prior to resuming her chemotherapy. She was seen earlier this week by the bowel are pulmonology. She was restarted on Lasix 40 mg by mouth once daily. She continues to have issues with edema and reports she finds it harder to breeze as well as harder to ambulate due to the fluid. She does report increased urination after having started the Lasix.  Overall she tolerated her first cycle of chemotherapy without any significant difficulty. She does report some fatigue. She denied any fever, chills, cough, shortness of breath or hemoptysis. She denied nausea, vomiting, diarrhea constipation. She denied any significant weight loss or night sweats. She reports decreased appetite as well as some generalized malaise.  MEDICAL HISTORY: Past Medical History  Diagnosis Date  . Ulcer 06-08-13    Left medial ankle  . Hypertension   . Thyroid disease      Hypo-Thyroidism  . Esophageal reflux   . COPD (chronic obstructive pulmonary disease)   . Peripheral vascular disease   . Hypothyroidism   . Insomnia     takes lorazepam  . Arthritis   . Dysrhythmia   . DVT (deep venous thrombosis) 2008  . Paroxysmal a-fib 2008  . PE (pulmonary thromboembolism) 2008  . Wears dentures     ALLERGIES:  is allergic to poison ivy extract; sulfa antibiotics; and sulfacetamide sodium.  MEDICATIONS:  Current Outpatient Prescriptions  Medication Sig Dispense Refill  . acetaminophen (TYLENOL) 500 MG tablet Take 500 mg by mouth every 6 (six) hours as needed (pain).    . calcium-vitamin D (OSCAL WITH D) 500-200 MG-UNIT per tablet Take 1 tablet by mouth daily with breakfast.     . carvedilol (COREG) 3.125 MG tablet Take 1 tablet (3.125 mg total) by mouth 2 (two) times daily with a meal.    . dexamethasone (DECADRON) 4 MG tablet 4 mg by mouth twice a day the day before, day of and day after the chemotherapy every 3 weeks (Patient taking differently: Take 4 mg by mouth See admin instructions. 4 mg by mouth twice a day the day before, day of and day after the chemotherapy every 3 weeks) 40 tablet 1  . digoxin (LANOXIN) 0.25 MG tablet Take 0.25 mg by mouth every evening.     . feeding supplement, ENSURE COMPLETE, (ENSURE COMPLETE) LIQD Take 237 mLs by mouth 2 (two) times daily between meals.    . folic acid (FOLVITE) 1 MG tablet Take 1 tablet (1 mg total) by  mouth daily. 30 tablet 3  . furosemide (LASIX) 40 MG tablet Take 40 mg by mouth daily.    [name] levothyroxine (SYNTHROID, LEVOTHROID) 88 MCG tablet Take 88 mcg by mouth daily before breakfast.    . Omega-3 Fatty Acids (SALMON OIL-1000 PO) Take 1 tablet by mouth daily.     [name] gefitinib (IRESSA) 250 MG tablet Take 1 tablet (250 mg total) by mouth daily. 30 tablet 2   No current facility-administered medications for this visit.    SURGICAL HISTORY:  Past Surgical History  Procedure Laterality Date  .  Tonsillectomy    . Eye surgery Right Aug. 2014    Cataract  . Eye surgery Left Oct. 2014    Cataract  . Abdominal hysterectomy  1984    Total  . Video bronchoscopy with endobronchial navigation N/A 04/20/2014    Procedure: VIDEO BRONCHOSCOPY WITH ENDOBRONCHIAL NAVIGATION;  Surgeon: Collene Gobble, MD;  Location: [facility];  Service: Thoracic;  Laterality: N/A;  . Video bronchoscopy with endobronchial ultrasound N/A 04/20/2014    Procedure: VIDEO BRONCHOSCOPY WITH ENDOBRONCHIAL ULTRASOUND;  Surgeon: Collene Gobble, MD;  Location: [facility];  Service: Thoracic;  Laterality: N/A;    REVIEW OF SYSTEMS:  Review of Systems  Constitutional: Positive for malaise/fatigue. Negative for fever, chills, weight loss and diaphoresis.  HENT: Negative for congestion, ear discharge, ear pain, hearing loss, nosebleeds, sore throat and tinnitus.   Eyes: Negative for blurred vision, double vision, photophobia, pain, discharge and redness.  Respiratory: Negative for cough, hemoptysis, sputum production, shortness of breath, wheezing and stridor.   Cardiovascular: Positive for leg swelling. Negative for chest pain, palpitations, orthopnea, claudication and PND.  Gastrointestinal: Negative for heartburn, nausea, vomiting, abdominal pain, diarrhea, constipation, blood in stool and melena.  Genitourinary: Negative.   Musculoskeletal: Negative.   Skin: Negative.   Neurological: Negative for dizziness, tingling, focal weakness, seizures, weakness and headaches.  Endo/Heme/Allergies: Does not bruise/bleed easily.  Psychiatric/Behavioral: Negative for depression. The patient is not nervous/anxious and does not have insomnia.      PHYSICAL EXAMINATION: Physical Exam  Constitutional: She is oriented to person, place, and time and well-developed, well-nourished, and in no distress.  HENT:  Head: Normocephalic and atraumatic.  Mouth/Throat: Oropharynx is clear and moist.  Eyes: Pupils are equal, round, and reactive to  light.  Neck: Normal range of motion. Neck supple. No JVD present. No tracheal deviation present. No thyromegaly present.  Cardiovascular: Normal rate, regular rhythm, normal heart sounds and intact distal pulses.  Exam reveals no gallop and no friction rub.   No murmur heard. Pulmonary/Chest: Effort normal and breath sounds normal. No respiratory distress. She has no wheezes. She has no rales.  Abdominal: Soft. Bowel sounds are normal. She exhibits distension. She exhibits no mass. There is no tenderness.  Musculoskeletal: Normal range of motion. She exhibits edema. She exhibits no tenderness.  2-3+ pitting edema bilateral lower extremities to the thigh level  Lymphadenopathy:    She has no cervical adenopathy.  Neurological: She is alert and oriented to person, place, and time. She has normal reflexes. Gait normal.  Skin: Skin is warm and dry. No rash noted.    ECOG PERFORMANCE STATUS: 2 - Symptomatic, <50% confined to bed  Blood pressure 98/57, pulse 66, temperature 97.9 F (36.6 C), temperature source Oral, resp. rate 19, height 5' 8"  (1.727 m), weight 189 lb 12.8 oz (86.093 kg), SpO2 100 %.  LABORATORY DATA: Lab Results  Component Value Date   WBC 6.9 06/16/2014  HGB 8.9* 06/16/2014   HCT 29.4* 06/16/2014   MCV 102.1* 06/16/2014   PLT 185 06/16/2014      Chemistry      Component Value Date/Time   NA 132* 06/16/2014 0941   NA 130* 06/14/2014 1137   K 5.2* 06/16/2014 0941   K 5.8* 06/14/2014 1137   CL 91* 06/14/2014 1137   CO2 34* 06/16/2014 0941   CO2 33* 06/14/2014 1137   BUN 32.8* 06/16/2014 0941   BUN 35* 06/14/2014 1137   CREATININE 0.9 06/16/2014 0941   CREATININE 1.1 06/14/2014 1137      Component Value Date/Time   CALCIUM 8.7 06/16/2014 0941   CALCIUM 8.7 06/14/2014 1137   ALKPHOS 139 06/16/2014 0941   ALKPHOS 120* 06/14/2014 1137   AST 20 06/16/2014 0941   AST 22 06/14/2014 1137   ALT 16 06/16/2014 0941   ALT 16 06/14/2014 1137   BILITOT 1.09  06/16/2014 0941   BILITOT 1.0 06/14/2014 1137       RADIOGRAPHIC STUDIES:  Ct Abdomen Pelvis Wo Contrast  05/29/2014   CLINICAL DATA:  Follow-up known rectus sheath and pelvic hematoma. Subsequent encounter. Hemorrhagic shock.  EXAM: CT ABDOMEN AND PELVIS WITHOUT CONTRAST  TECHNIQUE: Multidetector CT imaging of the abdomen and pelvis was performed following the standard protocol without IV contrast.  COMPARISON:  CT of the abdomen and pelvis performed 05/27/2014  FINDINGS: Small bilateral pleural effusions are seen, new from the prior study. This is likely reactive secondary to the blood described below, under the diaphragm. Associated mild atelectasis is seen.  The patient's large rectus sheath/pelvic hematoma has increased mildly in size, now measuring approximately 20.7 x 6.4 x 8.6 cm along the rectus sheath, and 17.8 x 8.6 x 13.9 cm within the left hemipelvis. There is new leakage of the hematoma into the retroperitoneal space posterior to the left kidney, with blood now seen tracking about the liver and spleen. A small amount of blood is also seen tracking about the right kidney and right paracolic gutter.  Scattered vague hypodensities are again noted within the liver, measuring up to 4.3 cm in size. As before, these raise concern for malignancy, given the patient's history of malignancy. Some of these may reflect cysts. The spleen is unremarkable in appearance. A small stone is noted within the gallbladder. The gallbladder is otherwise grossly unremarkable, though difficult to fully assess given adjacent blood. The pancreas and adrenal glands are grossly unremarkable in appearance, though the adrenal glands are not well assessed.  There is minimal left-sided hydronephrosis. This appears to reflect mass effect from the large pelvic hematoma, on the left ureter. There is anterior displacement of the left kidney from retroperitoneal hematoma. A few small renal cysts are noted, and a parenchymal  calcification is seen near the upper pole of the left kidney. No renal or ureteral stones are identified.  No free fluid is identified. The small bowel is unremarkable in appearance. The stomach is within normal limits. No acute vascular abnormalities are seen. Scattered calcification is seen along the abdominal aorta and its branches.  The appendix is not definitely seen; there is no evidence for appendicitis. Mild diverticulosis is noted along the sigmoid colon, without evidence of diverticulitis. The sigmoid colon is displaced by the pelvic hematoma.  The bladder is relatively decompressed and not well assessed. Soft tissue swelling is noted along the left side of the pelvis, and lateral abdominal wall bilaterally. No inguinal lymphadenopathy is seen.  No acute osseous abnormalities are identified. Mild  diffuse degenerative change is noted along the lumbar spine.  IMPRESSION: 1. Large rectus sheath/pelvic hematoma has increased mildly in size, now measuring approximately 20.7 x 6.4 x 8.6 cm along the rectus sheath, and 17.8 x 8.6 x 13.9 cm at the left hemipelvis. New leakage of hematoma into the retroperitoneal space posterior to the left kidney, with blood now seen tracking about the liver and spleen. Small amount of blood also noted tracking about the right kidney and right paracolic gutter. 2. New small bilateral pleural effusions are likely reactive secondary to the blood under the diaphragm. 3. Minimal left-sided hydronephrosis appears to reflect mass effect from the large pelvic hematoma, on the left ureter. 4. Soft tissue swelling along the left side of the pelvis, and lateral abdominal wall bilaterally. 5. Vague hypodensities again noted within the liver, measuring up to 4.3 cm. As before, these raise concern for malignancy, given the patient's history of malignancy. Some of these may reflect cysts, given their attenuation. 6. Cholelithiasis noted; gallbladder otherwise grossly unremarkable, though  difficult to fully assess given adjacent blood. 7. Few small renal cysts noted. 8. Scattered calcification along the abdominal aorta and its branches. 9. Mild diverticulosis along the sigmoid colon, without evidence of diverticulitis. 10. Mild bilateral atelectasis noted.  These results were called by telephone at the time of interpretation on 05/29/2014 at 1:39 am to Nursing in the [facility], who verbally acknowledged these results.   Electronically Signed   By: Garald Balding M.D.   On: 05/29/2014 01:52   Ct Abdomen Pelvis Wo Contrast  05/27/2014   CLINICAL DATA:  80 year old with adenocarcinoma of the lung, increased dizziness, low remote  EXAM: CT ABDOMEN AND PELVIS WITHOUT CONTRAST  TECHNIQUE: Multidetector CT imaging of the abdomen and pelvis was performed following the standard protocol without IV contrast.  COMPARISON:  None.  FINDINGS: Chest:The partially visualized chest demonstrates an enlarged heart. There is elevation of left hemidiaphragm. No suspicious pulmonary nodule or mass seen the visualized lungs. Areas of dependent atelectasis are present.  Liver: Scattered hypodensities are noted throughout the liver and are indeterminate on this unenhanced evaluation, given history of malignancy findings are suspicious for malignancy. The largest lesion is noted within the left hepatic lobe, adjacent to the falciform ligament and measures approximately 4.1 x 2.8 cm.  Gallbladder: Unremarkable.  Spleen: Unremarkable.  Pancreas: Unremarkable.  Adrenal glands: Unremarkable.  Kidneys: A partially exophytic hypodense left upper pole renal lesion likely represents a cyst. 2 tiny nonobstructing calcifications are noted in the upper pole of the left kidney. The right kidney is unremarkable. There is no hydronephrosis.  A Foley catheter is noted within a decompressed urinary bladder. Tiny foci of gas within urinary bladder corresponds to catheterization.  Bowel/gastrointestinal tract: There is no  evidence for bowel obstruction. There is no abnormal bowel wall thickening  Pelvis: A large heterogeneous mass with the pelvis with associated extension to the anterior abdominal wall and left rectus abdominus muscle. This finding appears to be contiguous and represents a single a multiloculated heterogeneous mass (sagittal image 70). These findings are most compatible with hematomas. The pelvic component measures up to approximately 17.5 x 8.0 cm. The rectus abdominal component measures up to 8.5 x 5.6 cm in transverse and AP dimension respectively. Inflammatory fat stranding is noted about this collection, question an underlying infectious process.  The uterus appears to be surgically absent.  Miscellaneous:  There is no pelvic or retroperitoneal adenopathy  Osseous structures: There is no acute osseous abnormality.  Multilevel degenerative changes of the spine are present. There is slight thoracolumbar S-shaped scoliosis. A sclerotic approximately 12 mm lesion involving the posterior right lateral aspect of the L4 vertebral body is present.  IMPRESSION: 1. Large rectus sheath/pelvic hematoma as described above. Fat stranding is noted about the pelvic component, question superimposed infectious process. Clinical correlation is recommended. 2. Hepatic masses are suspicious for metastatic disease given history of lung cancer. These findings are incompletely evaluated on unenhanced CT evaluation. 3. L4 sclerotic lesion is indeterminate, osseous metastatic disease cannot be excluded. 4. Cardiomegaly.   Electronically Signed   By: Rosemarie Ax   On: 05/27/2014 15:29   Ct Head Wo Contrast  05/30/2014   CLINICAL DATA:  Acute encephalopathy  EXAM: CT HEAD WITHOUT CONTRAST  TECHNIQUE: Contiguous axial images were obtained from the base of the skull through the vertex without intravenous contrast.  COMPARISON:  MRI brain 05/20/2014  FINDINGS: There is no evidence of mass effect, midline shift, or extra-axial fluid  collections. There is no evidence of a space-occupying lesion or intracranial hemorrhage. There is no evidence of a cortical-based area of acute infarction. There is generalized cerebral atrophy. There is periventricular white matter low attenuation likely secondary to microangiopathy.  The ventricles and sulci are appropriate for the patient's age. The basal cisterns are patent.  Visualized portions of the orbits are unremarkable. The visualized portions of the paranasal sinuses and mastoid air cells are unremarkable. Cerebrovascular atherosclerotic calcifications are noted.  The osseous structures are unremarkable.  IMPRESSION: 1. No acute intracranial pathology. 2. Chronic microvascular disease and cerebral atrophy.   Electronically Signed   By: Kathreen Devoid   On: 05/30/2014 14:51   Mr Brain W Wo Contrast  05/20/2014   CLINICAL DATA:  Lung cancer. No current symptoms. Staging. Initial encounter.  EXAM: MRI HEAD WITHOUT AND WITH CONTRAST  TECHNIQUE: Multiplanar, multiecho pulse sequences of the brain and surrounding structures were obtained without and with intravenous contrast.  CONTRAST:  76m MULTIHANCE GADOBENATE DIMEGLUMINE 529 MG/ML IV SOLN  COMPARISON:  None.  FINDINGS: No evidence for acute infarction, hemorrhage, mass lesion, hydrocephalus, or extra-axial fluid. Moderate cerebral and cerebellar atrophy. Mild subcortical and periventricular T2 and FLAIR hyperintensities, likely chronic microvascular ischemic change. Flow voids are maintained throughout the carotid, basilar, and vertebral arteries. There are no areas of chronic hemorrhage. Normal pituitary and cerebellar tonsils. Moderate cervical spondylosis with suspected cord flattening incompletely evaluated. No worrisome osseous lesions to suggest metastatic disease.  Post infusion, no abnormal enhancement of the brain or meninges. Major dural venous sinuses are patent. Visualized scalp soft tissues unremarkable. No sinus or mastoid disease.   IMPRESSION: Chronic changes as described. No evidence for metastatic disease to the brain or its coverings.   Electronically Signed   By: JRolla FlattenM.D.   On: 05/20/2014 11:05   Nm Pulmonary Perf And Vent  05/31/2014   CLINICAL DATA:  80year old with known lung cancer, superficial venous clot an unexplained shock with elevated pulmonary artery pressure  EXAM: NUCLEAR MEDICINE VENTILATION - PERFUSION LUNG SCAN  TECHNIQUE: Ventilation images were obtained in multiple projections using inhaled aerosol technetium 99 M DTPA. Perfusion images were obtained in multiple projections after intravenous injection of Tc-972mAA.  RADIOPHARMACEUTICALS:  36.1 mCi Tc-9960mPA aerosol and 5.1 mCi Tc-64m76m  COMPARISON:  None. Correlation with chest radiograph from 05/29/2014  FINDINGS: Ventilation: There is mottled heterogeneous uptake of radiotracer within the bilateral lungs. Areas of apparent radiotracer clumping are noted.  Perfusion: No definite  wedge shaped peripheral perfusion defects to suggest acute pulmonary embolism.  Several matched defects are present bilaterally and may correspond to underlying lung disease.  The heart is enlarged.  IMPRESSION: 1. Low probability for acute pulmonary emboli. 2. Heterogeneous ventilation suggest underlying lung disease. 3. Cardiomegaly.   Electronically Signed   By: Rosemarie Ax   On: 05/31/2014 15:02   Dg Chest Port 1 View  05/30/2014   CLINICAL DATA:  Acute onset of shortness of breath. Subsequent encounter.  EXAM: PORTABLE CHEST - 1 VIEW  COMPARISON:  Chest radiograph performed 05/28/2014  FINDINGS: The patient's left subclavian line is noted ending about the mid SVC.  The lungs are well-aerated. Vascular congestion is noted. Mildly increased interstitial markings could reflect minimal interstitial edema. There is no evidence of pleural effusion or pneumothorax.  The cardiomediastinal silhouette is mildly enlarged. No acute osseous abnormalities are seen.  IMPRESSION:  Vascular congestion and mild cardiomegaly noted. Mildly increased interstitial markings could reflect minimal interstitial edema.   Electronically Signed   By: Garald Balding M.D.   On: 05/30/2014 00:19   Dg Chest Port 1 View  05/28/2014   CLINICAL DATA:  Status post central line placement. Initial encounter.  EXAM: PORTABLE CHEST - 1 VIEW  COMPARISON:  Chest radiograph performed 05/27/2014  FINDINGS: The patient's left subclavian line is noted ending about the mid SVC.  The lungs are mildly hypoexpanded. Mild vascular crowding and vascular congestion are seen. No definite pleural effusion or pneumothorax identified.  The cardiomediastinal silhouette is enlarged. No acute osseous abnormalities are identified.  IMPRESSION: 1. Left subclavian line noted ending about the mid SVC. 2. Lungs mildly hypoexpanded, with mild vascular congestion and cardiomegaly. Lungs remain grossly clear.   Electronically Signed   By: Garald Balding M.D.   On: 05/28/2014 23:29   Dg Chest Port 1 View  05/27/2014   CLINICAL DATA:  Hypotension  EXAM: PORTABLE CHEST - 1 VIEW  COMPARISON:  05/13/2014  FINDINGS: Cardiac shadow is mildly enlarged but stable. The lungs are well aerated bilaterally. No focal infiltrate or sizable effusion is seen. No acute bony abnormality is noted.  IMPRESSION: No active disease.   Electronically Signed   By: Inez Catalina M.D.   On: 05/27/2014 10:53     ASSESSMENT/PLAN:  No problem-specific assessment & plan notes found for this encounter.  the patient is a pleasant 80 year old Caucasian female with stage IV (T1b, N3, M1 B) non-small cell lung cancer adenocarcinoma that is positive for EGFR mutation. She has received one cycle of systemic chemotherapy with carboplatin and Alimta that she tolerated relatively well. Unfortunately she has been in the hospital due to issues with recurrent edema and generalized weakness. Her overall performance status has declined making continuing to pursue systemic  chemotherapy difficult at this time. She does have an EGFR mutation we can switch her to an oral chemotherapeutic agent. Patient was discussed with and also seen by Dr. Julien Nordmann. He plans to switch her from systemic chemotherapy to Iressa (gefitinib) 250 mg by mouth daily, to be taken one hour before or 2 hours after a meal. Side effects including active form skin rash, skin dryness and diarrhea were discussed with the patient. She is in agreement with proceeding with oral chemotherapy with receptor. Prescription was sent to the [facility] outpatient pharmacy. She'll follow-up in 2 weeks for symptom management visit with a repeat CBC differential and C met.   Awilda Metro E, PA-C 06/16/2014  All questions were answered. The patient knows  to call the clinic with any problems, questions or concerns. We can certainly see the patient much sooner if necessary.  ADDENDUM: Hematology/Oncology Attending: I had a face to face encounter with the patient. I recommended her care plan. This is a very pleasant 80 years old white female diagnosed with metastatic non-small cell lung cancer, adenocarcinoma status post 1 cycle of systemic chemotherapy was carboplatin and Alimta. The patient was admitted recently to [facility] with hemorrhagic shock secondary to blood loss from retroperitoneal bleed secondary to anticoagulation. She is feeling much better but she is currently on a rehabilitation facility. The molecular studies performed at [facility] one showed EGFR mutation with deletion in exon 19. I discussed the results with the patient today. I recommended for her to discontinue her current treatment with carboplatin and Alimta. I would consider the patient for treatment with Gefitinib (Iressa) 250 mg by mouth daily. I will send the prescription to [facility] outpatient pharmacy. The patient was advised about the adverse effect of this treatment including skin rash, diarrhea, dry skin, interstitial lung  disease as well as liver or renal dysfunction. She will come back for follow-up visit in 2 weeks for reevaluation and management of any adverse effect of her treatment  She was advised to call immediately if she has any concerning symptoms in the interval.  Disclaimer: This note was dictated with voice recognition software. Similar sounding words can inadvertently be transcribed and may be missed upon review. Eilleen Kempf., MD 06/18/2014

## 2014-06-17 NOTE — Progress Notes (Signed)
Quick Note:  lmtcb X2 for pt. ______

## 2014-06-19 ENCOUNTER — Encounter: Payer: Self-pay | Admitting: Internal Medicine

## 2014-06-19 ENCOUNTER — Non-Acute Institutional Stay (SKILLED_NURSING_FACILITY): Payer: Medicare Other | Admitting: Internal Medicine

## 2014-06-19 DIAGNOSIS — C3492 Malignant neoplasm of unspecified part of left bronchus or lung: Secondary | ICD-10-CM

## 2014-06-19 DIAGNOSIS — I10 Essential (primary) hypertension: Secondary | ICD-10-CM

## 2014-06-19 DIAGNOSIS — R578 Other shock: Secondary | ICD-10-CM

## 2014-06-19 DIAGNOSIS — R579 Shock, unspecified: Secondary | ICD-10-CM

## 2014-06-19 DIAGNOSIS — D6859 Other primary thrombophilia: Secondary | ICD-10-CM

## 2014-06-19 DIAGNOSIS — D6852 Prothrombin gene mutation: Secondary | ICD-10-CM

## 2014-06-19 DIAGNOSIS — I809 Phlebitis and thrombophlebitis of unspecified site: Secondary | ICD-10-CM

## 2014-06-19 DIAGNOSIS — I4819 Other persistent atrial fibrillation: Secondary | ICD-10-CM

## 2014-06-19 DIAGNOSIS — C3402 Malignant neoplasm of left main bronchus: Secondary | ICD-10-CM

## 2014-06-19 DIAGNOSIS — I824Y2 Acute embolism and thrombosis of unspecified deep veins of left proximal lower extremity: Secondary | ICD-10-CM

## 2014-06-19 DIAGNOSIS — I481 Persistent atrial fibrillation: Secondary | ICD-10-CM

## 2014-06-19 NOTE — Assessment & Plan Note (Signed)
presented to [facility] ED 11/27 and was found to have hemoglobin of 5.2 and exam reveals findings consistent with rectal sheath hematoma. Her shock was complicated by bradycardia from beta blockers. PCCM Admitted to ICU. CT abd/pelvis showed a large rectus sheath hematoma. Treatment included transfusion of blood and blood products, vasoactive infusions w/ neosynephrine and supportive care. On 11/28 her BP needs increased and repeat CT abd confirmed worsening hematoma. On 11/30 hgb had stabilized ; no further anticoag for a fib and DVT

## 2014-06-19 NOTE — Assessment & Plan Note (Signed)
On Coreg and now on lasix. Will continue to monitor

## 2014-06-19 NOTE — Assessment & Plan Note (Signed)
No more lovenox or anticoagulation 2/2 retroperitoneal bleed

## 2014-06-19 NOTE — Assessment & Plan Note (Signed)
See above for superficial thrombosis

## 2014-06-19 NOTE — Assessment & Plan Note (Signed)
2/2 to stage 4 non small cell CA; however, pt is no longer candidate for anti-coagulation 2/2 to retroperitoneal bleed

## 2014-06-19 NOTE — Assessment & Plan Note (Addendum)
Resumed coreg at 1/2 dose; continue current digoxin dosing No further therapeutic anticoagulation due to life threatening bleed  Was on diamox 250mg  q evening and lasix 20 mg every morning. Also Lisinopril 10 mg daily; pt seen by pulmonary 12/15 and was restarted on lasix 40 mg daily for pedal edema and weight gain  from 178, post hospital to visit at 186. Goal was given to be 160 lbs

## 2014-06-19 NOTE — Assessment & Plan Note (Signed)
s/p only one cycle of Chemo.  .  Plan:  O2 as needed for O2 > 92% Pulmonary hygiene.  Will have onc f/u for discussion further chemo

## 2014-06-19 NOTE — Progress Notes (Signed)
MRN: [id number] Name: Brittney Thomas  Sex: female Age: 80 y.o. DOB: 1933/02/15  [place] #: Helene Kelp Facility/Room:223 Level Of Care: SNF Provider: Inocencio Homes D Emergency Contacts: Extended Emergency Contact Information Primary Emergency Contact: Bristow,Chris  [place] Phone: [phone] Relation: Son Secondary Emergency Contact: Laverna Peace [place] Phone: [phone] Relation: Daughter    Allergies: Poison ivy extract; Sulfa antibiotics; and Sulfacetamide sodium  Chief Complaint  Patient presents with  . New Admit To SNF    HPI: Patient is 80 y.o. female who has stage 4 non small cell CA is admitted to SNF for generalized weakness for OT/PT 2/2 retroperitoneal bleed and complicated hospital course.  Past Medical History  Diagnosis Date  . Ulcer 06-08-13    Left medial ankle  . Hypertension   . Thyroid disease     Hypo-Thyroidism  . Esophageal reflux   . COPD (chronic obstructive pulmonary disease)   . Peripheral vascular disease   . Hypothyroidism   . Insomnia     takes lorazepam  . Arthritis   . Dysrhythmia   . DVT (deep venous thrombosis) 2008  . Paroxysmal a-fib 2008  . PE (pulmonary thromboembolism) 2008  . Wears dentures     Past Surgical History  Procedure Laterality Date  . Tonsillectomy    . Eye surgery Right Aug. 2014    Cataract  . Eye surgery Left Oct. 2014    Cataract  . Abdominal hysterectomy  1984    Total  . Video bronchoscopy with endobronchial navigation N/A 04/20/2014    Procedure: VIDEO BRONCHOSCOPY WITH ENDOBRONCHIAL NAVIGATION;  Surgeon: Collene Gobble, MD;  Location: [place];  Service: Thoracic;  Laterality: N/A;  . Video bronchoscopy with endobronchial ultrasound N/A 04/20/2014    Procedure: VIDEO BRONCHOSCOPY WITH ENDOBRONCHIAL ULTRASOUND;  Surgeon: Collene Gobble, MD;  Location: [place];  Service: Thoracic;  Laterality: N/A;      Medication List       This list is accurate as of:  06/19/14  3:18 PM.  Always use your most recent med list.               acetaminophen 500 MG tablet  Commonly known as:  TYLENOL  Take 500 mg by mouth every 6 (six) hours as needed (pain).     calcium-vitamin D 500-200 MG-UNIT per tablet  Commonly known as:  OSCAL WITH D  Take 1 tablet by mouth daily with breakfast.     carvedilol 3.125 MG tablet  Commonly known as:  COREG  Take 1 tablet (3.125 mg total) by mouth 2 (two) times daily with a meal.     dexamethasone 4 MG tablet  Commonly known as:  DECADRON  4 mg by mouth twice a day the day before, day of and day after the chemotherapy every 3 weeks     digoxin 0.25 MG tablet  Commonly known as:  LANOXIN  Take 0.25 mg by mouth every evening.     feeding supplement (ENSURE COMPLETE) Liqd  Take 237 mLs by mouth 2 (two) times daily between meals.     folic acid 1 MG tablet  Commonly known as:  FOLVITE  Take 1 tablet (1 mg total) by mouth daily.     furosemide 40 MG tablet  Commonly known as:  LASIX  Take 40 mg by mouth daily.     gefitinib 250 MG tablet  Commonly known as:  IRESSA  Take 1 tablet (250 mg total) by mouth  daily.     levothyroxine 88 MCG tablet  Commonly known as:  SYNTHROID, LEVOTHROID  Take 88 mcg by mouth daily before breakfast.     SALMON OIL-1000 PO  Take 1 tablet by mouth daily.        No orders of the defined types were placed in this encounter.    Immunization History  Administered Date(s) Administered  . Influenza,inj,Quad PF,36+ Mos 04/16/2014    History  Substance Use Topics  . Smoking status: Never Smoker   . Smokeless tobacco: Never Used  . Alcohol Use: No    Family history is noncontributory    Review of Systems  DATA OBTAINED: from patient, nurse GENERAL:  no fevers,+ fatigue, appetite changes SKIN: No itching, rash or wounds EYES: No eye pain, redness, discharge EARS: No earache, tinnitus, change in hearing NOSE: No congestion, drainage or bleeding  MOUTH/THROAT: No  mouth or tooth pain, No sore throat RESPIRATORY: No cough, wheezing, SOB CARDIAC: No chest pain, palpitations,+ lower extremity edema  GI: No abdominal pain, No N/V/D or constipation, No heartburn or reflux  GU: No dysuria, frequency or urgency, or incontinence  MUSCULOSKELETAL: No unrelieved bone/joint pain NEUROLOGIC: No headache, dizziness  PSYCHIATRIC: No overt anxiety or sadness, No behavior issue.   There were no vitals filed for this visit.  Physical Exam  GENERAL APPEARANCE: Alert, conversant,  No acute distress.  SKIN: No diaphoresis rash HEAD: Normocephalic, atraumatic  EYES: Conjunctiva/lids clear. Pupils round, reactive. EOMs intact.  EARS: External exam WNL, canals clear. Hearing grossly normal.  NOSE: No deformity or discharge.  MOUTH/THROAT: Lips w/o lesions  RESPIRATORY: Breathing is even, unlabored. Lung sounds are clear   CARDIOVASCULAR: Heart RRR no murmurs, rubs or gallops. 2+ peripheral edema.   GASTROINTESTINAL: Abdomen is soft, non-tender, not distended w/ normal bowel sounds. GENITOURINARY: Bladder non tender, not distended  MUSCULOSKELETAL: No abnormal joints or musculature NEUROLOGIC:  Cranial nerves 2-12 grossly intact. Moves all extremities  PSYCHIATRIC: Mood and affect appropriate to situation, no behavioral issues  Patient Active Problem List   Diagnosis Date Noted  . Benign essential HTN 06/19/2014  . Leg swelling 06/14/2014  . Jaundice 06/14/2014  . Chronic respiratory failure 06/14/2014  . Blood loss anemia 05/27/2014  . Adenocarcinoma 05/27/2014  . Shock 05/27/2014  . Hemorrhagic shock 05/27/2014  . AKI (acute kidney injury) 05/27/2014  . Lung cancer, main bronchus 05/03/2014  . Adenocarcinoma, lung 04/15/2014  . Thyroid nodule 04/15/2014  . Atrial fibrillation 04/15/2014  . Primary hypercoagulable state 04/15/2014  . Acute venous embolism and thrombosis of deep vessels of proximal lower extremity 04/15/2014  . Ulcer of lower limb,  unspecified-Left medial ankle 06/23/2013  . Superficial thrombophlebitis 06/23/2013  . Chronic venous insufficiency 06/23/2013    CBC    Component Value Date/Time   WBC 6.9 06/16/2014 0940   WBC 10.6* 06/14/2014 1137   RBC 2.88* 06/16/2014 0940   RBC 2.94* 06/14/2014 1137   HGB 8.9* 06/16/2014 0940   HGB 9.3* 06/14/2014 1137   HCT 29.4* 06/16/2014 0940   HCT 29.7* 06/14/2014 1137   PLT 185 06/16/2014 0940   PLT 190.0 06/14/2014 1137   MCV 102.1* 06/16/2014 0940   MCV 101.0* 06/14/2014 1137   LYMPHSABS 0.5* 06/16/2014 0940   LYMPHSABS 0.5* 06/14/2014 1137   MONOABS 1.4* 06/16/2014 0940   MONOABS 1.6* 06/14/2014 1137   EOSABS 0.0 06/16/2014 0940   EOSABS 0.0 06/14/2014 1137   BASOSABS 0.0 06/16/2014 0940   BASOSABS 0.0 06/14/2014 1137  CMP     Component Value Date/Time   NA 132* 06/16/2014 0941   NA 130* 06/14/2014 1137   K 5.2* 06/16/2014 0941   K 5.8* 06/14/2014 1137   CL 91* 06/14/2014 1137   CO2 34* 06/16/2014 0941   CO2 33* 06/14/2014 1137   GLUCOSE 96 06/16/2014 0941   GLUCOSE 105* 06/14/2014 1137   BUN 32.8* 06/16/2014 0941   BUN 35* 06/14/2014 1137   CREATININE 0.9 06/16/2014 0941   CREATININE 1.1 06/14/2014 1137   CALCIUM 8.7 06/16/2014 0941   CALCIUM 8.7 06/14/2014 1137   PROT 6.1* 06/16/2014 0941   PROT 6.3 06/14/2014 1137   ALBUMIN 2.8* 06/16/2014 0941   ALBUMIN 3.0* 06/14/2014 1137   AST 20 06/16/2014 0941   AST 22 06/14/2014 1137   ALT 16 06/16/2014 0941   ALT 16 06/14/2014 1137   ALKPHOS 139 06/16/2014 0941   ALKPHOS 120* 06/14/2014 1137   BILITOT 1.09 06/16/2014 0941   BILITOT 1.0 06/14/2014 1137   GFRNONAA 80* 06/05/2014 0350   GFRAA >90 06/05/2014 0350    Assessment and Plan  Hemorrhagic shock presented to [facility] ED 11/27 and was found to have hemoglobin of 5.2 and exam reveals findings consistent with rectal sheath hematoma. Her shock was complicated by bradycardia from beta blockers. PCCM Admitted to ICU. CT abd/pelvis showed a  large rectus sheath hematoma. Treatment included transfusion of blood and blood products, vasoactive infusions w/ neosynephrine and supportive care. On 11/28 her BP needs increased and repeat CT abd confirmed worsening hematoma. On 11/30 hgb had stabilized ; no further anticoag for a fib and DVT  Lung cancer, main bronchus s/p only one cycle of Chemo.  .  Plan:  O2 as needed for O2 > 92% Pulmonary hygiene.  Will have onc f/u for discussion further chemo    Atrial fibrillation Resumed coreg at 1/2 dose; continue current digoxin dosing No further therapeutic anticoagulation due to life threatening bleed  Was on diamox 250mg  q evening and lasix 20 mg every morning. Also Lisinopril 10 mg daily; pt seen by pulmonary 12/15 and was restarted on lasix 40 mg daily for pedal edema and weight gain  from 178, post hospital to visit at 186. Goal was given to be 160 lbs  Benign essential HTN On Coreg and now on lasix. Will continue to monitor  Superficial thrombophlebitis No more lovenox or anticoagulation 2/2 retroperitoneal bleed  Acute venous embolism and thrombosis of deep vessels of proximal lower extremity See above for superficial thrombosis  Adenocarcinoma, lung Seen by onc yesterday and per their notes pt declined condition will not support systemic chemo, so pt was put on oral med  Primary hypercoagulable state 2/2 to stage 4 non small cell CA; however, pt is no longer candidate for anti-coagulation 2/2 to retroperitoneal bleed   Pt seen 06/17/2014 [name], MD

## 2014-06-19 NOTE — Assessment & Plan Note (Addendum)
Seen by onc yesterday and per their notes pt declined condition will not support systemic chemo, so pt was put on oral med

## 2014-06-21 ENCOUNTER — Other Ambulatory Visit: Payer: Medicare Other

## 2014-06-21 ENCOUNTER — Telehealth: Payer: Self-pay | Admitting: *Deleted

## 2014-06-21 ENCOUNTER — Encounter: Payer: Self-pay | Admitting: Internal Medicine

## 2014-06-21 ENCOUNTER — Ambulatory Visit: Payer: Medicare Other

## 2014-06-21 NOTE — Progress Notes (Signed)
Quick Note:  lmtcb for pt. ______ 

## 2014-06-21 NOTE — Progress Notes (Signed)
[facility], [id number], approved iressa 250mg  from 06/21/14-06/22/15 [id number]

## 2014-06-21 NOTE — Telephone Encounter (Signed)
Pt's daughter [name] called wanting to know if it is okay for pt to be on a blood thinner while on gefetinib.  Per Dr Vista Mink, okay to be on xarelto, not coumadin.  Pt has a hx of a-fib.  Called and left a msg with [name] on vm with instructions.  Also informed her that Charlena Cross did prior authorization and to contact the pharmacy to see if her drug is ready for pick up.

## 2014-06-26 ENCOUNTER — Encounter (HOSPITAL_COMMUNITY): Payer: Self-pay | Admitting: Emergency Medicine

## 2014-06-26 ENCOUNTER — Emergency Department (HOSPITAL_COMMUNITY): Payer: Medicare Other

## 2014-06-26 ENCOUNTER — Inpatient Hospital Stay (HOSPITAL_COMMUNITY)
Admission: EM | Admit: 2014-06-26 | Discharge: 2014-07-07 | DRG: 640 | Disposition: A | Discharge status: Skilled Nursing Facility | Payer: Medicare Other | Attending: Internal Medicine | Admitting: Internal Medicine

## 2014-06-26 DIAGNOSIS — E8770 Fluid overload, unspecified: Secondary | ICD-10-CM | POA: Diagnosis present

## 2014-06-26 DIAGNOSIS — T460X5A Adverse effect of cardiac-stimulant glycosides and drugs of similar action, initial encounter: Secondary | ICD-10-CM | POA: Diagnosis present

## 2014-06-26 DIAGNOSIS — J449 Chronic obstructive pulmonary disease, unspecified: Secondary | ICD-10-CM | POA: Diagnosis present

## 2014-06-26 DIAGNOSIS — C3400 Malignant neoplasm of unspecified main bronchus: Secondary | ICD-10-CM | POA: Diagnosis present

## 2014-06-26 DIAGNOSIS — Z91048 Other nonmedicinal substance allergy status: Secondary | ICD-10-CM

## 2014-06-26 DIAGNOSIS — R8290 Unspecified abnormal findings in urine: Secondary | ICD-10-CM | POA: Diagnosis present

## 2014-06-26 DIAGNOSIS — I272 Other secondary pulmonary hypertension: Secondary | ICD-10-CM | POA: Diagnosis present

## 2014-06-26 DIAGNOSIS — Z809 Family history of malignant neoplasm, unspecified: Secondary | ICD-10-CM

## 2014-06-26 DIAGNOSIS — T460X1A Poisoning by cardiac-stimulant glycosides and drugs of similar action, accidental (unintentional), initial encounter: Secondary | ICD-10-CM | POA: Diagnosis present

## 2014-06-26 DIAGNOSIS — R4182 Altered mental status, unspecified: Secondary | ICD-10-CM

## 2014-06-26 DIAGNOSIS — Z9841 Cataract extraction status, right eye: Secondary | ICD-10-CM

## 2014-06-26 DIAGNOSIS — E872 Acidosis: Secondary | ICD-10-CM | POA: Diagnosis present

## 2014-06-26 DIAGNOSIS — E46 Unspecified protein-calorie malnutrition: Secondary | ICD-10-CM | POA: Diagnosis present

## 2014-06-26 DIAGNOSIS — D649 Anemia, unspecified: Secondary | ICD-10-CM | POA: Diagnosis present

## 2014-06-26 DIAGNOSIS — Z823 Family history of stroke: Secondary | ICD-10-CM

## 2014-06-26 DIAGNOSIS — C801 Malignant (primary) neoplasm, unspecified: Secondary | ICD-10-CM | POA: Diagnosis present

## 2014-06-26 DIAGNOSIS — N182 Chronic kidney disease, stage 2 (mild): Secondary | ICD-10-CM | POA: Diagnosis present

## 2014-06-26 DIAGNOSIS — C7989 Secondary malignant neoplasm of other specified sites: Secondary | ICD-10-CM | POA: Diagnosis present

## 2014-06-26 DIAGNOSIS — Z9842 Cataract extraction status, left eye: Secondary | ICD-10-CM

## 2014-06-26 DIAGNOSIS — D6859 Other primary thrombophilia: Secondary | ICD-10-CM | POA: Diagnosis present

## 2014-06-26 DIAGNOSIS — R58 Hemorrhage, not elsewhere classified: Secondary | ICD-10-CM | POA: Insufficient documentation

## 2014-06-26 DIAGNOSIS — I482 Chronic atrial fibrillation, unspecified: Secondary | ICD-10-CM | POA: Diagnosis present

## 2014-06-26 DIAGNOSIS — Z79899 Other long term (current) drug therapy: Secondary | ICD-10-CM

## 2014-06-26 DIAGNOSIS — N390 Urinary tract infection, site not specified: Secondary | ICD-10-CM | POA: Diagnosis present

## 2014-06-26 DIAGNOSIS — M1990 Unspecified osteoarthritis, unspecified site: Secondary | ICD-10-CM | POA: Diagnosis present

## 2014-06-26 DIAGNOSIS — E039 Hypothyroidism, unspecified: Secondary | ICD-10-CM | POA: Diagnosis present

## 2014-06-26 DIAGNOSIS — G9340 Encephalopathy, unspecified: Secondary | ICD-10-CM | POA: Diagnosis present

## 2014-06-26 DIAGNOSIS — K219 Gastro-esophageal reflux disease without esophagitis: Secondary | ICD-10-CM | POA: Diagnosis present

## 2014-06-26 DIAGNOSIS — R600 Localized edema: Secondary | ICD-10-CM | POA: Diagnosis present

## 2014-06-26 DIAGNOSIS — Z882 Allergy status to sulfonamides status: Secondary | ICD-10-CM

## 2014-06-26 DIAGNOSIS — J9621 Acute and chronic respiratory failure with hypoxia: Secondary | ICD-10-CM | POA: Diagnosis present

## 2014-06-26 DIAGNOSIS — I739 Peripheral vascular disease, unspecified: Secondary | ICD-10-CM | POA: Diagnosis present

## 2014-06-26 DIAGNOSIS — E871 Hypo-osmolality and hyponatremia: Secondary | ICD-10-CM | POA: Diagnosis not present

## 2014-06-26 DIAGNOSIS — E038 Other specified hypothyroidism: Secondary | ICD-10-CM | POA: Insufficient documentation

## 2014-06-26 DIAGNOSIS — R609 Edema, unspecified: Secondary | ICD-10-CM

## 2014-06-26 DIAGNOSIS — I480 Paroxysmal atrial fibrillation: Secondary | ICD-10-CM | POA: Diagnosis present

## 2014-06-26 DIAGNOSIS — I129 Hypertensive chronic kidney disease with stage 1 through stage 4 chronic kidney disease, or unspecified chronic kidney disease: Secondary | ICD-10-CM | POA: Diagnosis present

## 2014-06-26 DIAGNOSIS — Z8249 Family history of ischemic heart disease and other diseases of the circulatory system: Secondary | ICD-10-CM

## 2014-06-26 DIAGNOSIS — Z6829 Body mass index (BMI) 29.0-29.9, adult: Secondary | ICD-10-CM

## 2014-06-26 DIAGNOSIS — I10 Essential (primary) hypertension: Secondary | ICD-10-CM | POA: Diagnosis present

## 2014-06-26 DIAGNOSIS — Z90710 Acquired absence of both cervix and uterus: Secondary | ICD-10-CM

## 2014-06-26 MED ORDER — NALOXONE HCL 0.4 MG/ML IJ SOLN
0.4000 mg | Freq: Once | INTRAMUSCULAR | Status: AC
Start: 2014-06-27 — End: 2014-06-26
  Administered 2014-06-26: 0.4 mg via INTRAVENOUS
  Filled 2014-06-26: qty 1

## 2014-06-26 NOTE — ED Notes (Signed)
Patient from [place] where they did blood work today and patient was found to have a low sodium level. Patient just started chemo today for lung cancer and facility states patient was "more lethargic than normal." Patient is also in a-fib and has a history of same. Rate is 60-70. Patient is on 2L via [place] at all times. 147/80, HR 68

## 2014-06-26 NOTE — ED Provider Notes (Signed)
CSN: [id number]     Arrival date & time 06/26/14  2201 History  This chart was scribed for Julianne Rice, MD by Peyton Bottoms, ED Scribe. This patient was seen in room A03C/A03C and the patient's care was started at 11:43 PM.  Chief Complaint  Patient presents with  . Altered Mental Status  . Atrial Fibrillation  . Abnormal Lab   Patient is a 80 y.o. female presenting with altered mental status and atrial fibrillation. The history is provided by the patient. No language interpreter was used.  Altered Mental Status Presenting symptoms: lethargy   Severity:  Moderate Most recent episode:  Today Timing:  Constant Progression:  Unchanged Context: nursing home resident   Atrial Fibrillation   LEVEL 5 CAVEAT: ALTERED MENTAL STATUS  HPI Comments: Brittney Thomas is a 80 y.o. female brought in from nursing home, who presents to the Emergency Department complaining of altered mental status, abnormal labs, and atrial fibrillation.  Patient had blood work done today at [facility], and was found to have a low sodium level. Patient recently started chemotherapy for lung cancer. Per nursing home staff, patient seems "more lethargic than normal". Per nursing home, patient is lethargic at baseline.   Past Medical History  Diagnosis Date  . Ulcer 06-08-13    Left medial ankle  . Hypertension   . Thyroid disease     Hypo-Thyroidism  . Esophageal reflux   . COPD (chronic obstructive pulmonary disease)   . Peripheral vascular disease   . Hypothyroidism   . Insomnia     takes lorazepam  . Arthritis   . Dysrhythmia   . DVT (deep venous thrombosis) 2008  . Paroxysmal a-fib 2008  . PE (pulmonary thromboembolism) 2008  . Wears dentures    Past Surgical History  Procedure Laterality Date  . Tonsillectomy    . Eye surgery Right Aug. 2014    Cataract  . Eye surgery Left Oct. 2014    Cataract  . Abdominal hysterectomy  1984    Total  . Video bronchoscopy with endobronchial navigation N/A  04/20/2014    Procedure: VIDEO BRONCHOSCOPY WITH ENDOBRONCHIAL NAVIGATION;  Surgeon: Collene Gobble, MD;  Location: [facility];  Service: Thoracic;  Laterality: N/A;  . Video bronchoscopy with endobronchial ultrasound N/A 04/20/2014    Procedure: VIDEO BRONCHOSCOPY WITH ENDOBRONCHIAL ULTRASOUND;  Surgeon: Collene Gobble, MD;  Location: [facility];  Service: Thoracic;  Laterality: N/A;   Family History  Problem Relation Age of Onset  . Heart disease Maternal Grandmother   . Cancer Brother     kidney  . Stroke Brother    History  Substance Use Topics  . Smoking status: Never Smoker   . Smokeless tobacco: Never Used  . Alcohol Use: No   OB History    No data available     Review of Systems  Unable to perform ROS: Mental status change   Allergies  Poison ivy extract; Sulfa antibiotics; and Sulfacetamide sodium  Home Medications   Prior to Admission medications   Medication Sig Start Date End Date Taking? Authorizing Provider  acetaminophen (TYLENOL) 500 MG tablet Take 500 mg by mouth every 6 (six) hours as needed (pain).   Yes Historical Provider, MD  calcium-vitamin D (OSCAL WITH D) 500-200 MG-UNIT per tablet Take 1 tablet by mouth daily with breakfast.    Yes Historical Provider, MD  digoxin (LANOXIN) 0.25 MG tablet Take 0.25 mg by mouth every evening.    Yes Historical Provider, MD  Fish Oil-Cholecalciferol (  FISH OIL + D3 PO) Take 1 capsule by mouth daily.   Yes Historical Provider, MD  folic acid (FOLVITE) 1 MG tablet Take 1 tablet (1 mg total) by mouth daily. 05/03/14  Yes Curt Bears, MD  furosemide (LASIX) 40 MG tablet Take 40 mg by mouth daily.   Yes Historical Provider, MD  levothyroxine (SYNTHROID, LEVOTHROID) 88 MCG tablet Take 88 mcg by mouth daily before breakfast.   Yes Historical Provider, MD  carvedilol (COREG) 3.125 MG tablet Take 1 tablet (3.125 mg total) by mouth 2 (two) times daily with a meal. Patient not taking: Reported on 06/27/2014 06/06/14   Erick Colace, NP   dexamethasone (DECADRON) 4 MG tablet 4 mg by mouth twice a day the day before, day of and day after the chemotherapy every 3 weeks Patient not taking: Reported on 06/27/2014 05/03/14   Curt Bears, MD  feeding supplement, ENSURE COMPLETE, (ENSURE COMPLETE) LIQD Take 237 mLs by mouth 2 (two) times daily between meals. Patient not taking: Reported on 06/27/2014 06/06/14   Erick Colace, NP  gefitinib (IRESSA) 250 MG tablet Take 1 tablet (250 mg total) by mouth daily. Patient not taking: Reported on 06/27/2014 06/16/14   Curt Bears, MD  Omega-3 Fatty Acids (SALMON OIL-1000 PO) Take 1 tablet by mouth daily.     Historical Provider, MD   Triage Vitals: BP 129/64 mmHg  Pulse 38  Temp(Src) 98.1 F (36.7 C) (Oral)  Resp 23  SpO2 99%  Physical Exam  Constitutional: She appears well-developed and well-nourished. No distress.  Lethargic. Not responsive to voice.  HENT:  Head: Normocephalic and atraumatic.  Mouth/Throat: Oropharynx is clear and moist.  Eyes: Conjunctivae are normal. Pupils are equal, round, and reactive to light.  Pupils 2 mm bilaterally and minimally responsive.  Neck: Normal range of motion. Neck supple. JVD present. No tracheal deviation present.  No neck stiffness.  Cardiovascular:  Bradycardia. Irregularly irregular.  Pulmonary/Chest: Effort normal. No respiratory distress. She has no wheezes. She has no rales.  Crackles in bilateral bases.  Abdominal: Soft. Bowel sounds are normal. She exhibits mass (ventral abdominal hernia.). She exhibits no distension. There is no tenderness. There is no rebound and no guarding.  Musculoskeletal: Normal range of motion. She exhibits edema. She exhibits no tenderness.  3+ pitting edema to bilateral knees. Distal pulses intact.  Neurological:  Patient is not opening eyes to voice. Patient is not following commands.  Skin: Skin is warm and dry. No rash noted. No erythema.  Psychiatric: She has a normal mood and affect. Her  behavior is normal.  Nursing note and vitals reviewed.  ED Course  Procedures (including critical care time)  DIAGNOSTIC STUDIES: Oxygen Saturation is 99% on [place], normal by my interpretation.    COORDINATION OF CARE: 11:47 PM- Discussed plans to order diagnostic CT of head, EKG and lab work. Will give patient Narcan 0.4mg  injection. Labs Review Labs Reviewed  CBC WITH DIFFERENTIAL - Abnormal; Notable for the following:    RBC 3.27 (*)    Hemoglobin 10.3 (*)    HCT 32.6 (*)    RDW 20.3 (*)    Neutrophils Relative % 78 (*)    Lymphocytes Relative 8 (*)    Monocytes Relative 14 (*)    Monocytes Absolute 1.4 (*)    All other components within normal limits  COMPREHENSIVE METABOLIC PANEL - Abnormal; Notable for the following:    Sodium 120 (*)    Chloride 75 (*)    CO2 36 (*)  BUN 25 (*)    Albumin 3.0 (*)    GFR calc non Af Amer 52 (*)    GFR calc Af Amer 60 (*)    All other components within normal limits  BRAIN NATRIURETIC PEPTIDE - Abnormal; Notable for the following:    B Natriuretic Peptide 620.7 (*)    All other components within normal limits  TROPONIN I - Abnormal; Notable for the following:    Troponin I 0.05 (*)    All other components within normal limits  PROTIME-INR - Abnormal; Notable for the following:    Prothrombin Time 16.5 (*)    All other components within normal limits  URINALYSIS, ROUTINE W REFLEX MICROSCOPIC - Abnormal; Notable for the following:    APPearance CLOUDY (*)    Hgb urine dipstick LARGE (*)    Leukocytes, UA LARGE (*)    All other components within normal limits  URINE MICROSCOPIC-ADD ON - Abnormal; Notable for the following:    Squamous Epithelial / LPF MANY (*)    Bacteria, UA FEW (*)    All other components within normal limits  DIGOXIN LEVEL - Abnormal; Notable for the following:    Digoxin Level 3.2 (*)    All other components within normal limits  AMMONIA - Abnormal; Notable for the following:    Ammonia 38 (*)    All other  components within normal limits  BASIC METABOLIC PANEL - Abnormal; Notable for the following:    Sodium 121 (*)    Chloride 76 (*)    CO2 35 (*)    BUN 26 (*)    GFR calc non Af Amer 58 (*)    GFR calc Af Amer 67 (*)    All other components within normal limits  BASIC METABOLIC PANEL - Abnormal; Notable for the following:    Sodium 121 (*)    Chloride 76 (*)    CO2 37 (*)    BUN 25 (*)    GFR calc non Af Amer 59 (*)    GFR calc Af Amer 69 (*)    All other components within normal limits  CBC WITH DIFFERENTIAL - Abnormal; Notable for the following:    RBC 3.19 (*)    Hemoglobin 10.1 (*)    HCT 31.6 (*)    RDW 20.5 (*)    Neutro Abs 8.0 (*)    Lymphocytes Relative 8 (*)    Monocytes Relative 15 (*)    Monocytes Absolute 1.5 (*)    All other components within normal limits  HEPATIC FUNCTION PANEL - Abnormal; Notable for the following:    Total Protein 5.9 (*)    Albumin 2.8 (*)    Bilirubin, Direct 0.5 (*)    All other components within normal limits  OSMOLALITY, URINE - Abnormal; Notable for the following:    Osmolality, Ur 212 (*)    All other components within normal limits  OSMOLALITY - Abnormal; Notable for the following:    Osmolality 252 (*)    All other components within normal limits  COMPREHENSIVE METABOLIC PANEL - Abnormal; Notable for the following:    Sodium 121 (*)    Chloride 74 (*)    CO2 39 (*)    Glucose, Bld 67 (*)    BUN 29 (*)    Total Protein 5.3 (*)    Albumin 2.8 (*)    GFR calc non Af Amer 55 (*)    GFR calc Af Amer 64 (*)    All other components  within normal limits  GLUCOSE, CAPILLARY - Abnormal; Notable for the following:    Glucose-Capillary 61 (*)    All other components within normal limits  GLUCOSE, CAPILLARY - Abnormal; Notable for the following:    Glucose-Capillary 125 (*)    All other components within normal limits  I-STAT ARTERIAL BLOOD GAS, ED - Abnormal; Notable for the following:    pH, Arterial 7.322 (*)    pCO2 arterial  76.8 (*)    pO2, Arterial 106.0 (*)    Bicarbonate 39.7 (*)    Acid-Base Excess 10.0 (*)    All other components within normal limits  I-STAT ARTERIAL BLOOD GAS, ED - Abnormal; Notable for the following:    pH, Arterial 7.341 (*)    pCO2 arterial 76.0 (*)    pO2, Arterial 65.0 (*)    Bicarbonate 41.1 (*)    Acid-Base Excess 12.0 (*)    All other components within normal limits  I-STAT ARTERIAL BLOOD GAS, ED - Abnormal; Notable for the following:    pH, Arterial 7.341 (*)    pCO2 arterial 76.9 (*)    pO2, Arterial 102.0 (*)    Bicarbonate 41.5 (*)    Acid-Base Excess 12.0 (*)    All other components within normal limits  CULTURE, BLOOD (ROUTINE X 2)  CULTURE, BLOOD (ROUTINE X 2)  URINE CULTURE  MRSA PCR SCREENING  APTT  TSH  CORTISOL  SODIUM, URINE, RANDOM  GLUCOSE, CAPILLARY  GLUCOSE, CAPILLARY  DIGOXIN LEVEL  GLUCOSE, CAPILLARY  GLUCOSE, CAPILLARY  GLUCOSE, CAPILLARY  GLUCOSE, CAPILLARY  GLUCOSE, CAPILLARY  GLUCOSE, CAPILLARY  CBC  BASIC METABOLIC PANEL  I-STAT CG4 LACTIC ACID, ED  CBG MONITORING, ED    Imaging Review Ct Head Wo Contrast  06/27/2014   CLINICAL DATA:  Initial evaluation for acute altered mental status.  EXAM: CT HEAD WITHOUT CONTRAST  TECHNIQUE: Contiguous axial images were obtained from the base of the skull through the vertex without intravenous contrast.  COMPARISON:  Prior study from 05/30/2014.  FINDINGS: Diffuse prominence of the CSF containing spaces is compatible with generalized cerebral atrophy. Patchy and confluent hypodensity within the periventricular and deep white matter both cerebral hemispheres most consistent with chronic small vessel ischemic disease.  No acute large vessel territory infarct. No intracranial hemorrhage. No mass lesion, midline shift, or mass effect. No hydrocephalus. There is no extra-axial fluid collection.  No acute abnormality seen about the orbits. Scalp soft tissues within normal limits.  Calvarium intact.   Paranasal sinuses are clear.  No mastoid effusion.  IMPRESSION: 1. No acute intracranial process. 2. Atrophy with chronic microvascular ischemic changes.   Electronically Signed   By: Jeannine Boga M.D.   On: 06/27/2014 03:18   Dg Chest Port 1 View  06/27/2014   CLINICAL DATA:  Altered mental status  EXAM: PORTABLE CHEST - 1 VIEW  COMPARISON:  05/29/2014  FINDINGS: Haziness of the lower chest, obscuring the left more than right diaphragm.  Chronic cardiomegaly.  Stable aortic and hilar contours.  No edema noted in the upper lungs.  No pneumothorax.  IMPRESSION: Bibasilar lung opacities. Hazy appearance favors atelectasis or small effusions over pneumonia.   Electronically Signed   By: Jorje Guild M.D.   On: 06/27/2014 00:05     EKG Interpretation None       Date: 06/27/2014  Rate: 60  Rhythm: atrial fibrillation  QRS Axis: normal  Intervals: normal  ST/T Wave abnormalities: nonspecific T wave changes  Conduction Disutrbances:none  Narrative Interpretation:  Old EKG Reviewed: unchanged  CRITICAL CARE Performed by: Lita Mains, Ambyr Qadri Total critical care time: 30 min Critical care time was exclusive of separately billable procedures and treating other patients. Critical care was necessary to treat or prevent imminent or life-threatening deterioration. Critical care was time spent personally by me on the following activities: development of treatment plan with patient and/or surrogate as well as nursing, discussions with consultants, evaluation of patient's response to treatment, examination of patient, obtaining history from patient or surrogate, ordering and performing treatments and interventions, ordering and review of laboratory studies, ordering and review of radiographic studies, pulse oximetry and re-evaluation of patient's condition.   MDM   Final diagnoses:  Altered mental status    I personally performed the services described in this documentation, which was  scribed in my presence. The recorded information has been reviewed and is accurate.  Response to Narcan. ABG shows a respiratory acidosis. Patient is more alert and will open eyes to voice and follow simple commands. Started on BiPAP. Discussed with [facility] and will admit to step down bed.  Julianne Rice, MD 06/28/14 [phone]

## 2014-06-27 ENCOUNTER — Emergency Department (HOSPITAL_COMMUNITY): Payer: Medicare Other

## 2014-06-27 ENCOUNTER — Encounter (HOSPITAL_COMMUNITY): Payer: Self-pay | Admitting: Radiology

## 2014-06-27 DIAGNOSIS — Z6829 Body mass index (BMI) 29.0-29.9, adult: Secondary | ICD-10-CM | POA: Diagnosis not present

## 2014-06-27 DIAGNOSIS — I482 Chronic atrial fibrillation, unspecified: Secondary | ICD-10-CM | POA: Diagnosis present

## 2014-06-27 DIAGNOSIS — Z823 Family history of stroke: Secondary | ICD-10-CM | POA: Diagnosis not present

## 2014-06-27 DIAGNOSIS — Z8249 Family history of ischemic heart disease and other diseases of the circulatory system: Secondary | ICD-10-CM | POA: Diagnosis not present

## 2014-06-27 DIAGNOSIS — I129 Hypertensive chronic kidney disease with stage 1 through stage 4 chronic kidney disease, or unspecified chronic kidney disease: Secondary | ICD-10-CM | POA: Diagnosis present

## 2014-06-27 DIAGNOSIS — Z9842 Cataract extraction status, left eye: Secondary | ICD-10-CM | POA: Diagnosis not present

## 2014-06-27 DIAGNOSIS — R8290 Unspecified abnormal findings in urine: Secondary | ICD-10-CM | POA: Diagnosis present

## 2014-06-27 DIAGNOSIS — Z90710 Acquired absence of both cervix and uterus: Secondary | ICD-10-CM | POA: Diagnosis not present

## 2014-06-27 DIAGNOSIS — Z91048 Other nonmedicinal substance allergy status: Secondary | ICD-10-CM | POA: Diagnosis not present

## 2014-06-27 DIAGNOSIS — T460X1A Poisoning by cardiac-stimulant glycosides and drugs of similar action, accidental (unintentional), initial encounter: Secondary | ICD-10-CM | POA: Diagnosis present

## 2014-06-27 DIAGNOSIS — C7989 Secondary malignant neoplasm of other specified sites: Secondary | ICD-10-CM | POA: Diagnosis present

## 2014-06-27 DIAGNOSIS — N390 Urinary tract infection, site not specified: Secondary | ICD-10-CM | POA: Diagnosis present

## 2014-06-27 DIAGNOSIS — Z9841 Cataract extraction status, right eye: Secondary | ICD-10-CM | POA: Diagnosis not present

## 2014-06-27 DIAGNOSIS — C801 Malignant (primary) neoplasm, unspecified: Secondary | ICD-10-CM | POA: Diagnosis present

## 2014-06-27 DIAGNOSIS — N182 Chronic kidney disease, stage 2 (mild): Secondary | ICD-10-CM | POA: Diagnosis present

## 2014-06-27 DIAGNOSIS — J9621 Acute and chronic respiratory failure with hypoxia: Secondary | ICD-10-CM | POA: Diagnosis present

## 2014-06-27 DIAGNOSIS — T460X5A Adverse effect of cardiac-stimulant glycosides and drugs of similar action, initial encounter: Secondary | ICD-10-CM | POA: Diagnosis present

## 2014-06-27 DIAGNOSIS — E871 Hypo-osmolality and hyponatremia: Secondary | ICD-10-CM | POA: Diagnosis present

## 2014-06-27 DIAGNOSIS — M1990 Unspecified osteoarthritis, unspecified site: Secondary | ICD-10-CM | POA: Diagnosis present

## 2014-06-27 DIAGNOSIS — E46 Unspecified protein-calorie malnutrition: Secondary | ICD-10-CM | POA: Diagnosis present

## 2014-06-27 DIAGNOSIS — D6859 Other primary thrombophilia: Secondary | ICD-10-CM | POA: Diagnosis present

## 2014-06-27 DIAGNOSIS — I480 Paroxysmal atrial fibrillation: Secondary | ICD-10-CM | POA: Diagnosis present

## 2014-06-27 DIAGNOSIS — Z79899 Other long term (current) drug therapy: Secondary | ICD-10-CM | POA: Diagnosis not present

## 2014-06-27 DIAGNOSIS — I272 Other secondary pulmonary hypertension: Secondary | ICD-10-CM | POA: Diagnosis present

## 2014-06-27 DIAGNOSIS — G9340 Encephalopathy, unspecified: Secondary | ICD-10-CM | POA: Diagnosis present

## 2014-06-27 DIAGNOSIS — I10 Essential (primary) hypertension: Secondary | ICD-10-CM | POA: Diagnosis not present

## 2014-06-27 DIAGNOSIS — Z882 Allergy status to sulfonamides status: Secondary | ICD-10-CM | POA: Diagnosis not present

## 2014-06-27 DIAGNOSIS — E8770 Fluid overload, unspecified: Secondary | ICD-10-CM | POA: Diagnosis present

## 2014-06-27 DIAGNOSIS — D649 Anemia, unspecified: Secondary | ICD-10-CM | POA: Diagnosis present

## 2014-06-27 DIAGNOSIS — R600 Localized edema: Secondary | ICD-10-CM | POA: Diagnosis not present

## 2014-06-27 DIAGNOSIS — E039 Hypothyroidism, unspecified: Secondary | ICD-10-CM | POA: Diagnosis present

## 2014-06-27 DIAGNOSIS — E872 Acidosis: Secondary | ICD-10-CM | POA: Diagnosis present

## 2014-06-27 DIAGNOSIS — J449 Chronic obstructive pulmonary disease, unspecified: Secondary | ICD-10-CM | POA: Diagnosis present

## 2014-06-27 DIAGNOSIS — K219 Gastro-esophageal reflux disease without esophagitis: Secondary | ICD-10-CM | POA: Diagnosis present

## 2014-06-27 DIAGNOSIS — I739 Peripheral vascular disease, unspecified: Secondary | ICD-10-CM | POA: Diagnosis present

## 2014-06-27 DIAGNOSIS — Z809 Family history of malignant neoplasm, unspecified: Secondary | ICD-10-CM | POA: Diagnosis not present

## 2014-06-27 DIAGNOSIS — R4182 Altered mental status, unspecified: Secondary | ICD-10-CM | POA: Diagnosis present

## 2014-06-27 DIAGNOSIS — C3400 Malignant neoplasm of unspecified main bronchus: Secondary | ICD-10-CM | POA: Diagnosis present

## 2014-06-27 LAB — OSMOLALITY: Osmolality: 252 mOsm/kg — ABNORMAL LOW (ref 275–300)

## 2014-06-27 LAB — URINALYSIS, ROUTINE W REFLEX MICROSCOPIC
Bilirubin Urine: NEGATIVE
GLUCOSE, UA: NEGATIVE mg/dL
Ketones, ur: NEGATIVE mg/dL
Nitrite: NEGATIVE
PH: 6 (ref 5.0–8.0)
Protein, ur: NEGATIVE mg/dL
Specific Gravity, Urine: 1.008 (ref 1.005–1.030)
Urobilinogen, UA: 0.2 mg/dL (ref 0.0–1.0)

## 2014-06-27 LAB — I-STAT ARTERIAL BLOOD GAS, ED
ACID-BASE EXCESS: 10 mmol/L — AB (ref 0.0–2.0)
Acid-Base Excess: 12 mmol/L — ABNORMAL HIGH (ref 0.0–2.0)
Acid-Base Excess: 12 mmol/L — ABNORMAL HIGH (ref 0.0–2.0)
BICARBONATE: 39.7 meq/L — AB (ref 20.0–24.0)
BICARBONATE: 41.1 meq/L — AB (ref 20.0–24.0)
Bicarbonate: 41.5 meq/L — ABNORMAL HIGH (ref 20.0–24.0)
O2 SAT: 97 %
O2 SAT: 90 %
O2 Saturation: 97 %
PCO2 ART: 76.8 mmHg — AB (ref 35.0–45.0)
PCO2 ART: 76 mmHg — AB (ref 35.0–45.0)
PH ART: 7.341 — AB (ref 7.350–7.450)
PO2 ART: 65 mmHg — AB (ref 80.0–100.0)
Patient temperature: 98.6
Patient temperature: 98.6
Patient temperature: 98.6
TCO2: 42 mmol/L (ref 0–100)
TCO2: 43 mmol/L (ref 0–100)
TCO2: 44 mmol/L (ref 0–100)
pCO2 arterial: 76.9 mmHg (ref 35.0–45.0)
pH, Arterial: 7.322 — ABNORMAL LOW (ref 7.350–7.450)
pH, Arterial: 7.341 — ABNORMAL LOW (ref 7.350–7.450)
pO2, Arterial: 106 mmHg — ABNORMAL HIGH (ref 80.0–100.0)
pO2, Arterial: 102 mmHg — ABNORMAL HIGH (ref 80.0–100.0)

## 2014-06-27 LAB — CBC WITH DIFFERENTIAL/PLATELET
Basophils Absolute: 0 10*3/uL (ref 0.0–0.1)
Basophils Absolute: 0 10*3/uL (ref 0.0–0.1)
Basophils Relative: 0 % (ref 0–1)
Basophils Relative: 0 % (ref 0–1)
Eosinophils Absolute: 0 10*3/uL (ref 0.0–0.7)
Eosinophils Absolute: 0.1 10*3/uL (ref 0.0–0.7)
Eosinophils Relative: 0 % (ref 0–5)
Eosinophils Relative: 1 % (ref 0–5)
HCT: 31.6 % — ABNORMAL LOW (ref 36.0–46.0)
HEMATOCRIT: 32.6 % — AB (ref 36.0–46.0)
Hemoglobin: 10.1 g/dL — ABNORMAL LOW (ref 12.0–15.0)
Hemoglobin: 10.3 g/dL — ABNORMAL LOW (ref 12.0–15.0)
Lymphocytes Relative: 8 % — ABNORMAL LOW (ref 12–46)
Lymphocytes Relative: 8 % — ABNORMAL LOW (ref 12–46)
Lymphs Abs: 0.7 10*3/uL (ref 0.7–4.0)
Lymphs Abs: 0.8 10*3/uL (ref 0.7–4.0)
MCH: 31.5 pg (ref 26.0–34.0)
MCH: 31.7 pg (ref 26.0–34.0)
MCHC: 31.6 g/dL (ref 30.0–36.0)
MCHC: 32 g/dL (ref 30.0–36.0)
MCV: 99.1 fL (ref 78.0–100.0)
MCV: 99.7 fL (ref 78.0–100.0)
MONOS PCT: 14 % — AB (ref 3–12)
Monocytes Absolute: 1.4 10*3/uL — ABNORMAL HIGH (ref 0.1–1.0)
Monocytes Absolute: 1.5 10*3/uL — ABNORMAL HIGH (ref 0.1–1.0)
Monocytes Relative: 15 % — ABNORMAL HIGH (ref 3–12)
NEUTROS ABS: 7.7 10*3/uL (ref 1.7–7.7)
Neutro Abs: 8 10*3/uL — ABNORMAL HIGH (ref 1.7–7.7)
Neutrophils Relative %: 76 % (ref 43–77)
Neutrophils Relative %: 78 % — ABNORMAL HIGH (ref 43–77)
Platelets: 171 10*3/uL (ref 150–400)
Platelets: 188 10*3/uL (ref 150–400)
RBC: 3.19 MIL/uL — ABNORMAL LOW (ref 3.87–5.11)
RBC: 3.27 MIL/uL — ABNORMAL LOW (ref 3.87–5.11)
RDW: 20.3 % — ABNORMAL HIGH (ref 11.5–15.5)
RDW: 20.5 % — ABNORMAL HIGH (ref 11.5–15.5)
WBC: 10.4 10*3/uL (ref 4.0–10.5)
WBC: 9.9 10*3/uL (ref 4.0–10.5)

## 2014-06-27 LAB — PROTIME-INR
INR: 1.32 (ref 0.00–1.49)
Prothrombin Time: 16.5 seconds — ABNORMAL HIGH (ref 11.6–15.2)

## 2014-06-27 LAB — URINE MICROSCOPIC-ADD ON

## 2014-06-27 LAB — GLUCOSE, CAPILLARY
GLUCOSE-CAPILLARY: 82 mg/dL (ref 70–99)
Glucose-Capillary: 73 mg/dL (ref 70–99)
Glucose-Capillary: 85 mg/dL (ref 70–99)
Glucose-Capillary: 75 mg/dL (ref 70–99)

## 2014-06-27 LAB — BASIC METABOLIC PANEL
Anion gap: 10 (ref 5–15)
Anion gap: 8 (ref 5–15)
BUN: 26 mg/dL — AB (ref 6–23)
BUN: 25 mg/dL — ABNORMAL HIGH (ref 6–23)
CO2: 35 mmol/L — ABNORMAL HIGH (ref 19–32)
CO2: 37 mmol/L — AB (ref 19–32)
CREATININE: 0.92 mg/dL (ref 0.50–1.10)
CREATININE: 0.9 mg/dL (ref 0.50–1.10)
Calcium: 8.6 mg/dL (ref 8.4–10.5)
Calcium: 8.5 mg/dL (ref 8.4–10.5)
Chloride: 76 mEq/L — ABNORMAL LOW (ref 96–112)
Chloride: 76 mEq/L — ABNORMAL LOW (ref 96–112)
GFR calc non Af Amer: 58 mL/min — ABNORMAL LOW (ref >90)
GFR calc non Af Amer: 59 mL/min — ABNORMAL LOW (ref >90)
GFR, EST AFRICAN AMERICAN: 67 mL/min — AB (ref >90)
GFR, EST AFRICAN AMERICAN: 69 mL/min — AB (ref >90)
Glucose, Bld: 88 mg/dL (ref 70–99)
Glucose, Bld: 80 mg/dL (ref 70–99)
Potassium: 4.6 mmol/L (ref 3.5–5.1)
Potassium: 4.8 mmol/L (ref 3.5–5.1)
Sodium: 121 mmol/L — ABNORMAL LOW (ref 135–145)
Sodium: 121 mmol/L — ABNORMAL LOW (ref 135–145)

## 2014-06-27 LAB — COMPREHENSIVE METABOLIC PANEL
ALT: 17 U/L (ref 0–35)
AST: 31 U/L (ref 0–37)
Albumin: 3 g/dL — ABNORMAL LOW (ref 3.5–5.2)
Alkaline Phosphatase: 116 U/L (ref 39–117)
Anion gap: 9 (ref 5–15)
BILIRUBIN TOTAL: 1 mg/dL (ref 0.3–1.2)
BUN: 25 mg/dL — ABNORMAL HIGH (ref 6–23)
CHLORIDE: 75 meq/L — AB (ref 96–112)
CO2: 36 mmol/L — AB (ref 19–32)
Calcium: 8.8 mg/dL (ref 8.4–10.5)
Creatinine, Ser: 1.01 mg/dL (ref 0.50–1.10)
GFR, EST AFRICAN AMERICAN: 60 mL/min — AB (ref >90)
GFR, EST NON AFRICAN AMERICAN: 52 mL/min — AB (ref >90)
Glucose, Bld: 87 mg/dL (ref 70–99)
Potassium: 5 mmol/L (ref 3.5–5.1)
Sodium: 120 mmol/L — ABNORMAL LOW (ref 135–145)
Total Protein: 6.1 g/dL (ref 6.0–8.3)

## 2014-06-27 LAB — HEPATIC FUNCTION PANEL
ALT: 16 U/L (ref 0–35)
AST: 29 U/L (ref 0–37)
Albumin: 2.8 g/dL — ABNORMAL LOW (ref 3.5–5.2)
Alkaline Phosphatase: 109 U/L (ref 39–117)
BILIRUBIN DIRECT: 0.5 mg/dL — AB (ref 0.0–0.3)
BILIRUBIN INDIRECT: 0.7 mg/dL (ref 0.3–0.9)
BILIRUBIN TOTAL: 1.2 mg/dL (ref 0.3–1.2)
Total Protein: 5.9 g/dL — ABNORMAL LOW (ref 6.0–8.3)

## 2014-06-27 LAB — CBG MONITORING, ED: GLUCOSE-CAPILLARY: 77 mg/dL (ref 70–99)

## 2014-06-27 LAB — DIGOXIN LEVEL: Digoxin Level: 3.2 ng/mL (ref 0.8–2.0)

## 2014-06-27 LAB — BRAIN NATRIURETIC PEPTIDE: B NATRIURETIC PEPTIDE 5: 620.7 pg/mL — AB (ref 0.0–100.0)

## 2014-06-27 LAB — SODIUM, URINE, RANDOM: SODIUM UR: 54 mmol/L

## 2014-06-27 LAB — AMMONIA: Ammonia: 38 umol/L — ABNORMAL HIGH (ref 11–32)

## 2014-06-27 LAB — I-STAT CG4 LACTIC ACID, ED: Lactic Acid, Venous: 1.36 mmol/L (ref 0.5–2.2)

## 2014-06-27 LAB — TROPONIN I: Troponin I: 0.05 ng/mL — ABNORMAL HIGH (ref <0.031)

## 2014-06-27 LAB — OSMOLALITY, URINE: Osmolality, Ur: 212 mOsm/kg — ABNORMAL LOW (ref 390–1090)

## 2014-06-27 LAB — TSH: TSH: 1.479 u[IU]/mL (ref 0.350–4.500)

## 2014-06-27 LAB — MRSA PCR SCREENING: MRSA BY PCR: NEGATIVE

## 2014-06-27 LAB — CORTISOL: Cortisol, Plasma: 22.7 ug/dL

## 2014-06-27 LAB — APTT: aPTT: 33 seconds (ref 24–37)

## 2014-06-27 MED ORDER — SODIUM CHLORIDE 0.9 % IJ SOLN
3.0000 mL | Freq: Two times a day (BID) | INTRAMUSCULAR | Status: DC
  Administered 2014-06-27 – 2014-07-07 (×20): 3 mL via INTRAVENOUS

## 2014-06-27 MED ORDER — ROCURONIUM BROMIDE 50 MG/5ML IV SOLN
INTRAVENOUS | Status: AC
  Filled 2014-06-27: qty 2

## 2014-06-27 MED ORDER — SENNOSIDES-DOCUSATE SODIUM 8.6-50 MG PO TABS
1.0000 | ORAL_TABLET | Freq: Two times a day (BID) | ORAL | Status: DC
  Administered 2014-06-27 – 2014-07-07 (×12): 1 via ORAL
  Filled 2014-06-27 (×18): qty 1

## 2014-06-27 MED ORDER — FUROSEMIDE 10 MG/ML IJ SOLN
80.0000 mg | Freq: Two times a day (BID) | INTRAMUSCULAR | Status: DC
  Administered 2014-06-27 – 2014-07-01 (×10): 80 mg via INTRAVENOUS
  Filled 2014-06-27 (×14): qty 8

## 2014-06-27 MED ORDER — DIGOXIN IMMUNE FAB 40 MG IV SOLR
3.0000 | Freq: Once | INTRAVENOUS | Status: AC
  Administered 2014-06-27: 3 via INTRAVENOUS
  Filled 2014-06-27: qty 120

## 2014-06-27 MED ORDER — DEXTROSE 5 % IV SOLN
1.0000 g | Freq: Once | INTRAVENOUS | Status: AC
  Administered 2014-06-27: 1 g via INTRAVENOUS
  Filled 2014-06-27: qty 10

## 2014-06-27 MED ORDER — ACETAMINOPHEN 325 MG PO TABS: 650.0000 mg | ORAL_TABLET | Freq: Four times a day (QID) | ORAL | Status: DC | PRN

## 2014-06-27 MED ORDER — DEXTROSE 5 % IV SOLN
1.0000 g | INTRAVENOUS | Status: DC
  Administered 2014-06-27: 1 g via INTRAVENOUS
  Filled 2014-06-27 (×2): qty 10

## 2014-06-27 MED ORDER — ONDANSETRON HCL 4 MG PO TABS: 4.0000 mg | ORAL_TABLET | Freq: Four times a day (QID) | ORAL | Status: DC | PRN

## 2014-06-27 MED ORDER — ONDANSETRON HCL 4 MG/2ML IJ SOLN: 4.0000 mg | Freq: Four times a day (QID) | INTRAMUSCULAR | Status: DC | PRN

## 2014-06-27 MED ORDER — SUCCINYLCHOLINE CHLORIDE 20 MG/ML IJ SOLN
INTRAMUSCULAR | Status: AC
  Filled 2014-06-27: qty 1

## 2014-06-27 MED ORDER — LEVOTHYROXINE SODIUM 100 MCG IV SOLR
44.0000 ug | Freq: Every day | INTRAVENOUS | Status: DC
  Administered 2014-06-27 – 2014-06-28 (×2): 44 ug via INTRAVENOUS
  Filled 2014-06-27 (×3): qty 5

## 2014-06-27 MED ORDER — LIDOCAINE HCL (CARDIAC) 20 MG/ML IV SOLN
INTRAVENOUS | Status: AC
  Filled 2014-06-27: qty 5

## 2014-06-27 MED ORDER — FUROSEMIDE 10 MG/ML IJ SOLN
60.0000 mg | Freq: Once | INTRAMUSCULAR | Status: AC
  Administered 2014-06-27: 60 mg via INTRAVENOUS
  Filled 2014-06-27: qty 6

## 2014-06-27 MED ORDER — ACETAMINOPHEN 650 MG RE SUPP: 650.0000 mg | Freq: Four times a day (QID) | RECTAL | Status: DC | PRN

## 2014-06-27 MED ORDER — ETOMIDATE 2 MG/ML IV SOLN
INTRAVENOUS | Status: AC
  Filled 2014-06-27: qty 20

## 2014-06-27 NOTE — ED Notes (Signed)
Sacrum noted to be reddened with no open areas.  Incontinence care given.

## 2014-06-27 NOTE — ED Notes (Signed)
Admitting MD page to discuss pt sodium. No response at this time.

## 2014-06-27 NOTE — ED Notes (Signed)
Spoke with Pharmacy Synthroid to be pushed over 1 minute.

## 2014-06-27 NOTE — ED Notes (Signed)
Respiratory and Phlebotomy at the bedside.

## 2014-06-27 NOTE — Progress Notes (Signed)
Pt sats 100% via 6lpm [employer], no distress noted bipap remains on standby.

## 2014-06-27 NOTE — ED Notes (Signed)
Attempted report 

## 2014-06-27 NOTE — Progress Notes (Signed)
[name]  Brittney Thomas ZOX:[id number] DOB: 1932/09/26 DOA: 06/26/2014 PCP: [name], MD  Brief narrative: 80 year old female w/a history of metastatic adenocarcinoma of the lung and a recent admission for hemorrhagic shock due to rectus sheath hematoma in setting of anticoagulation (at time of discharge anticoagulation, ACE inhibitors and Lasix were discontinued because of acute kidney injury and recent relative hypotension) who was found to have altered mentation and was sent to the ER where her sodium was noted to be 120 (sodium was 132 at discharge). In review of outpatient records pulmonary medicine had recently resumed low-dose Lasix on 12/15 due to shortness of breath and increasing lower extremity edema. ABG in the ER revealed hypercarbia and hypoxemia so BiPAP was initiated. Of note patient recently started chemotherapy for her metastatic adenocarcinoma with oral medications. In addition to the above findings patient's urine was highly abnormal and concerning for urinary tract infection. Her urine specific gravity was also low at 1.008. A digoxin level was elevated at 3.2.  HPI/Subjective: Patient awakens briefly and seems to be more oriented than as described at time of admission. No specific complaints. States lower extremity edema has significantly worsened since time of last discharge.  Assessment/Plan:    Acute encephalopathy -Suspect related to hyponatremia as well as possible urinary tract infection - improving     Hyponatremia -Suspect volume overload - Urine sodium 54, urine and serum osmolality low, urine specific gravity low, patient was significant edema-insert Foley catheter and begin Lasix 80 mg IV every 12 hours and follow output-avoid rapid overcorrection    Acute and chronic respiratory failure with hypoxia -Secondary to volume overload in setting of pulmonary hypertension and TR and progressive lower extremity  edema-continue supportive care-see above regarding Lasix     Bilateral lower extremity edema -Progressive over several weeks-albumin only mildly low at 2.8-preserved LV function on echocardiogram in November but patient does have severe pulmonary hypertension with moderate to severe TR; surprisingly RV systolic function normal    Severe pulmonary hypertension -See above    Digoxin toxicity -Digoxin on hold-repeat level in a.m.-borderline bradycardia-QTc 335 ms    Primary hypercoagulable state -Anticoagulation discontinued after last admission in setting of significant rectus sheath hematoma with hemorrhagic shock    Chronic atrial fibrillation -Currently rate controlled with intermittent bradycardia rates down into the 40-50 range-dig toxicity as above-beta blocker on hold    Abnormal urinalysis -Appears consistent with UTI-obtain urine culture-cont empiric antibiotics    Lung cancer, main bronchus -Followed by Dr. Earlie Server    Benign essential HTN -Blood pressure controlled    Hypothyroidism -Continue Synthroid-TSH normal  DVT prophylaxis: SCDs Code Status: Full Family Communication: No family at bedside Disposition Plan/Expected LOS: Stepdown   Consultants: None  Procedures: None  Antibiotics: Rocephin 12/27 >  Objective: Blood pressure 120/50, pulse 55, temperature 97.3 F (36.3 C), temperature source Oral, resp. rate 20, height 5\' 7"  (1.702 m), weight 189 lb 11.2 oz (86.047 kg), SpO2 100 %.  Intake/Output Summary (Last 24 hours) at 06/27/14 1431 Last data filed at 06/27/14 1300  Gross per 24 hour  Intake     53 ml  Output    515 ml  Net   -462 ml   Exam: Follow-up exam completed. Patient admitted at 4 AM today  Scheduled Meds:  Scheduled Meds: . cefTRIAXone (ROCEPHIN)  IV  1 g Intravenous Q24H  . furosemide  80 mg Intravenous Q12H  . levothyroxine  44 mcg  Intravenous Daily  . sodium chloride  3 mL Intravenous Q12H   Data Reviewed: Basic Metabolic  Panel:  Recent Labs Lab 06/26/14 2352 06/27/14 0623 06/27/14 1000  NA 120* 121* 121*  K 5.0 4.6 4.8  CL 75* 76* 76*  CO2 36* 35* 37*  GLUCOSE 87 88 80  BUN 25* 26* 25*  CREATININE 1.01 0.92 0.90  CALCIUM 8.8 8.6 8.5   Liver Function Tests:  Recent Labs Lab 06/26/14 2352 06/27/14 0623  AST 31 29  ALT 17 16  ALKPHOS 116 109  BILITOT 1.0 1.2  PROT 6.1 5.9*  ALBUMIN 3.0* 2.8*    Recent Labs Lab 06/27/14 0623  AMMONIA 38*   CBC:  Recent Labs Lab 06/26/14 2352 06/27/14 0623  WBC 9.9 10.4  NEUTROABS 7.7 8.0*  HGB 10.3* 10.1*  HCT 32.6* 31.6*  MCV 99.7 99.1  PLT 188 171   Cardiac Enzymes:  Recent Labs Lab 06/26/14 2352  TROPONINI 0.05*   CBG:  Recent Labs Lab 06/27/14 0824 06/27/14 1359  GLUCAP 77 85    Studies:  Recent x-ray studies have been reviewed in detail by the Attending Physician  Time spent :  No charge  Erin Hearing, [place] [facility] Office  [phone] Pager [phone]  On-Call/Text Page:      [email or web address]      password [email or web address]  If 7PM-7AM, please contact night-coverage [email or web address] Password [email or web address] 06/27/2014, 2:31 PM   LOS: 1 day   I have examined and reviewed the entire database. I have reviewed the above note, made any necessary editorial changes, and agree with its content.  Cherene Altes, MD [facility]

## 2014-06-27 NOTE — ED Notes (Signed)
Incontinent of large amount of urine.  Incontinence care given at this time.  Tolerated well.  Resting quietly at this time with no distress noted.

## 2014-06-27 NOTE — ED Notes (Signed)
While doing in and out cath, RN noticed large amounts of bruising along lower abdomen and around left flank area. MD notified.

## 2014-06-27 NOTE — ED Notes (Signed)
O2 sats dropped into 70's,  In room to check on patient.  Oxygen pulled off.  Replaced.  Sat level up to 100% at this time.  Md aware of drop.

## 2014-06-27 NOTE — Progress Notes (Signed)
06/27/2014 Patient transfer from the emergency room to 2C02, she was sleepy, but arousable. Patient is alert. Patient was placed in bed. She have edema in bilateral upper and lower extremities. Bruised on lower abdomen, left side, lower extremities discoloration, bruises on bilateral arms, sacrum pink but blanches and excoriation between the groin area. Patient do have a stage 2  To right sacrum it is 1.0 x1.0. She was placed on contact, because she was positive for MRSA on 05/27/2014. She was place on telemetry and is afib. Patient history is not completed, because she is very sleepy and family is not at bedside. [facility] RN.

## 2014-06-27 NOTE — ED Notes (Signed)
Respiratory called to evaluate pt and need for bipap due to sats at 85% on 4L. Pt increased to 6L.

## 2014-06-27 NOTE — Care Management Note (Addendum)
    Page 1 of 1   07/07/2014     5:15:15 PM CARE MANAGEMENT NOTE 07/07/2014  Patient:  Brittney Thomas, Brittney Thomas   Account Number:  [id number]  Date Initiated:  06/27/2014  Documentation initiated by:  Elissa Hefty  Subjective/Objective Assessment:   adm w hyponatremia     Action/Plan:   from nsg facility   Anticipated DC Date:  07/07/2014   Anticipated DC Plan:  [facility]  In-house referral  Clinical Social Worker      DC Planning Services  CM consult      Choice offered to / List presented to:             Status of service:  Completed, signed off Medicare Important Message given?  YES (If response is "NO", the following Medicare IM given date fields will be blank) Date Medicare IM given:  06/29/2014 Medicare IM given by:  MAYO,HENRIETTA Date Additional Medicare IM given:  07/07/2014 Additional Medicare IM given by:  Tomi Bamberger  Discharge Disposition:  [place]  Per UR Regulation:  Reviewed for med. necessity/level of care/duration of stay  If discussed at [place] of Stay Meetings, dates discussed:    Comments:  07/04/14 [place], BSN [phone] patient has hyponatremia, uti, encdephalopathy, nephrology consulted.

## 2014-06-27 NOTE — ED Notes (Signed)
Respiratory therapist paged, patient resting comfortably, o2 sat 94% on bipap. Therapist acknowledges and coming to assess bipap alarms.

## 2014-06-27 NOTE — ED Notes (Signed)
Bipap removed per request of Dr. Raliegh Ip.  Patient more alert and talking at this time.  No distress noted or complaints voiced.

## 2014-06-27 NOTE — ED Notes (Signed)
Taken for CT scan at this time.

## 2014-06-27 NOTE — ED Notes (Signed)
Spoke with admitting about sodium 121, No recommendations for sodium at this time.

## 2014-06-27 NOTE — H&P (Addendum)
[facility] History and Physical  Brittney Thomas ZOX:[id number] DOB: [birth year] DOA: 06/26/2014  Referring physician: ER physician. PCP: [name], MD   Chief Complaint: Low sodium.  HPI: Brittney Thomas is a 80 y.o. female with history of metastatic adenocarcinoma of the lung who was recently admitted for hemorrhagic shock secondary to rectus sheath hematoma and at that time patient was on anticoagulants which was discontinued was brought in from the nursing home of the patient's sodium was found to be very low. As per patient's son who I discussed with patient was doing fine until yesterday and sodium was found low and was transferred to the ER. In the ER patient was found to be lethargic and confused and sodium was found to be around 120. The last sodium in our system was around 132. Patient was usually on Lasix which was discontinued recently from her hemorrhagic shock. Patient on exam appears edematous and BNP was found to be elevated. Patient's ABG shows mildly elevated PCO2 and was placed on BiPAP. On my exam patient looks very lethargic and is minimally arousable. And does not follow commands. As per patient's son with whom I discussed patient did not have any other complaints including nausea vomiting abdominal pain chest pain or shortness of breath when he had been with the patient day before. Patient was recently started on chemotherapy for her metastatic adenocarcinoma and at this time also start on oral tablet for the same.   Review of Systems: As presented in the history of presenting illness, rest negative.  Past Medical History  Diagnosis Date  . Ulcer 06-08-13    Left medial ankle  . Hypertension   . Thyroid disease     Hypo-Thyroidism  . Esophageal reflux   . COPD (chronic obstructive pulmonary disease)   . Peripheral vascular disease   . Hypothyroidism   . Insomnia     takes lorazepam  . Arthritis   . Dysrhythmia   . DVT (deep venous thrombosis) 2008  .  Paroxysmal a-fib 2008  . PE (pulmonary thromboembolism) 2008  . Wears dentures    Past Surgical History  Procedure Laterality Date  . Tonsillectomy    . Eye surgery Right Aug. 2014    Cataract  . Eye surgery Left Oct. 2014    Cataract  . Abdominal hysterectomy  1984    Total  . Video bronchoscopy with endobronchial navigation N/A 04/20/2014    Procedure: VIDEO BRONCHOSCOPY WITH ENDOBRONCHIAL NAVIGATION;  Surgeon: Collene Gobble, MD;  Location: [facility];  Service: Thoracic;  Laterality: N/A;  . Video bronchoscopy with endobronchial ultrasound N/A 04/20/2014    Procedure: VIDEO BRONCHOSCOPY WITH ENDOBRONCHIAL ULTRASOUND;  Surgeon: Collene Gobble, MD;  Location: [facility];  Service: Thoracic;  Laterality: N/A;   Social History:  reports that she has never smoked. She has never used smokeless tobacco. She reports that she does not drink alcohol or use illicit drugs. Where does patient live nursing home. Can patient participate in ADLs? Not sure.  Allergies  Allergen Reactions  . Poison Ivy Extract [Extract Of Poison Ivy] Rash  . Sulfa Antibiotics Rash  . Sulfacetamide Sodium Rash    Family History:  Family History  Problem Relation Age of Onset  . Heart disease Maternal Grandmother   . Cancer Brother     kidney  . Stroke Brother       Prior to Admission medications   Medication Sig Start Date End Date Taking? Authorizing Provider  acetaminophen (TYLENOL) 500 MG  tablet Take 500 mg by mouth every 6 (six) hours as needed (pain).   Yes Historical Provider, MD  calcium-vitamin D (OSCAL WITH D) 500-200 MG-UNIT per tablet Take 1 tablet by mouth daily with breakfast.    Yes Historical Provider, MD  digoxin (LANOXIN) 0.25 MG tablet Take 0.25 mg by mouth every evening.    Yes Historical Provider, MD  Fish Oil-Cholecalciferol (FISH OIL + D3 PO) Take 1 capsule by mouth daily.   Yes Historical Provider, MD  folic acid (FOLVITE) 1 MG tablet Take 1 tablet (1 mg total) by mouth daily. 05/03/14  Yes  Curt Bears, MD  furosemide (LASIX) 40 MG tablet Take 40 mg by mouth daily.   Yes Historical Provider, MD  levothyroxine (SYNTHROID, LEVOTHROID) 88 MCG tablet Take 88 mcg by mouth daily before breakfast.   Yes Historical Provider, MD  carvedilol (COREG) 3.125 MG tablet Take 1 tablet (3.125 mg total) by mouth 2 (two) times daily with a meal. Patient not taking: Reported on 06/27/2014 06/06/14   Erick Colace, NP  dexamethasone (DECADRON) 4 MG tablet 4 mg by mouth twice a day the day before, day of and day after the chemotherapy every 3 weeks Patient not taking: Reported on 06/27/2014 05/03/14   Curt Bears, MD  feeding supplement, ENSURE COMPLETE, (ENSURE COMPLETE) LIQD Take 237 mLs by mouth 2 (two) times daily between meals. Patient not taking: Reported on 06/27/2014 06/06/14   Erick Colace, NP  gefitinib (IRESSA) 250 MG tablet Take 1 tablet (250 mg total) by mouth daily. Patient not taking: Reported on 06/27/2014 06/16/14   Curt Bears, MD  Omega-3 Fatty Acids (SALMON OIL-1000 PO) Take 1 tablet by mouth daily.     Historical Provider, MD    Physical Exam: Filed Vitals:   06/27/14 0315 06/27/14 0320 06/27/14 0330 06/27/14 0345  BP: 106/59  106/46 157/76  Pulse: 50 69 52 69  Temp:      TempSrc:      Resp: 19 19 18 23   SpO2: 96% 95% 96% 96%     General:  Moderately built and nourished.  Eyes: Anicteric no pallor.  ENT: No discharge from the ears eyes nose or mouth.  Neck: No mass felt and no neck rigidity.  Cardiovascular: S1 and S2 heard.  Respiratory: No rhonchi or crepitations.  Abdomen: Soft nontender bowel sounds present.  Skin: No rash.  Musculoskeletal: Bilateral lower extremity edema.  Psychiatric: Patient is lethargic.  Neurologic: Patient is lethargic and difficult to assess further neurological status.  Labs on Admission:  Basic Metabolic Panel:  Recent Labs Lab 06/26/14 2352  NA 120*  K 5.0  CL 75*  CO2 36*  GLUCOSE 87  BUN 25*   CREATININE 1.01  CALCIUM 8.8   Liver Function Tests:  Recent Labs Lab 06/26/14 2352  AST 31  ALT 17  ALKPHOS 116  BILITOT 1.0  PROT 6.1  ALBUMIN 3.0*   No results for input(s): LIPASE, AMYLASE in the last 168 hours. No results for input(s): AMMONIA in the last 168 hours. CBC:  Recent Labs Lab 06/26/14 2352  WBC 9.9  NEUTROABS 7.7  HGB 10.3*  HCT 32.6*  MCV 99.7  PLT 188   Cardiac Enzymes:  Recent Labs Lab 06/26/14 2352  TROPONINI 0.05*    BNP (last 3 results) No results for input(s): PROBNP in the last 8760 hours. CBG: No results for input(s): GLUCAP in the last 168 hours.  Radiological Exams on Admission: Ct Head Wo Contrast  06/27/2014  CLINICAL DATA:  Initial evaluation for acute altered mental status.  EXAM: CT HEAD WITHOUT CONTRAST  TECHNIQUE: Contiguous axial images were obtained from the base of the skull through the vertex without intravenous contrast.  COMPARISON:  Prior study from 05/30/2014.  FINDINGS: Diffuse prominence of the CSF containing spaces is compatible with generalized cerebral atrophy. Patchy and confluent hypodensity within the periventricular and deep white matter both cerebral hemispheres most consistent with chronic small vessel ischemic disease.  No acute large vessel territory infarct. No intracranial hemorrhage. No mass lesion, midline shift, or mass effect. No hydrocephalus. There is no extra-axial fluid collection.  No acute abnormality seen about the orbits. Scalp soft tissues within normal limits.  Calvarium intact.  Paranasal sinuses are clear.  No mastoid effusion.  IMPRESSION: 1. No acute intracranial process. 2. Atrophy with chronic microvascular ischemic changes.   Electronically Signed   By: Jeannine Boga M.D.   On: 06/27/2014 03:18   Dg Chest Port 1 View  06/27/2014   CLINICAL DATA:  Altered mental status  EXAM: PORTABLE CHEST - 1 VIEW  COMPARISON:  05/29/2014  FINDINGS: Haziness of the lower chest, obscuring the left  more than right diaphragm.  Chronic cardiomegaly.  Stable aortic and hilar contours.  No edema noted in the upper lungs.  No pneumothorax.  IMPRESSION: Bibasilar lung opacities. Hazy appearance favors atelectasis or small effusions over pneumonia.   Electronically Signed   By: Jorje Guild M.D.   On: 06/27/2014 00:05    EKG: Independently reviewed. Atrial fibrillation rate around 60 bpm.  Assessment/Plan Active Problems:   Primary hypercoagulable state   Adenocarcinoma   Acute encephalopathy   Hyponatremia   Chronic atrial fibrillation   1. Acute encephalopathy - at this time patient appears lethargic and does not follow commands. CT head is negative for anything acute. Patient has been placed on BiPAP secondary to mildly elevated PCO2. Patient mental status could be secondary to sodium changes and also could be due to the mildly elevated carbon dioxide levels. I have discussed with on-call pulmonary critical care. If patient mental status does not improve then they will be seeing patient in consult. Closely follow sodium levels. I have ordered digoxin levels ammonia levels and TSH. Patient has been kept nothing by mouth for now. If patient mental status improves then continue her home medications. 2. Hyponatremia - most likely secondary to fluid overload. Patient was given Lasix 60 mg IV 1 dose in the ER. Closely follow metabolic panel for sodium trends. Check cortisol levels TSH and urine sodium and urine osmolality and serum osmolality. 3. Chronic atrial fibrillation - presently rate controlled. Patient presently is nothing by mouth because of the mental status changes but if patient is more alert and awake then start home medications including digoxin. Digoxin levels are pending. Patient is not a candidate for Coumadin secondary to recent life-threatening rectus sheath hematoma leading to hemorrhagic shock. 4. History of hypercoagulable status not a candidate for anticoagulation secondary to  recent hemorrhagic shock. 5. Metastatic adenocarcinoma - per oncologist. 6. Peripheral edema - 2-D echo done last month showed EF of 60-65%. Patient was given 1 dose of Lasix in the ER and based on the response further doses doses to be given. 7. UTI - patient has been placed on ceftriaxone. Follow urine cultures.  Addendum - on reexamining patient after 1 hour patient's mental status has largely improved. At this time on discontinuing patient's BiPAP. Closely observe.   DVT ProphylaxisSCDs as patient had recent hemorrhagic shock  from anticoagulation.  Code Status: Full code.  Family Communication: Patient's son.  Disposition Plan: Admit to inpatient.    KAKRAKANDY,ARSHAD N. [facility] Pager [phone].  If 7PM-7AM, please contact night-coverage [email or web address] Password [email or web address] 06/27/2014, 4:00 AM

## 2014-06-27 NOTE — ED Notes (Signed)
RN notified CBG 73

## 2014-06-27 NOTE — ED Notes (Signed)
Narcan given per order.  No change in responsiveness noted.

## 2014-06-28 ENCOUNTER — Encounter (HOSPITAL_COMMUNITY): Payer: Self-pay | Admitting: General Practice

## 2014-06-28 ENCOUNTER — Other Ambulatory Visit: Payer: Medicare Other

## 2014-06-28 DIAGNOSIS — J9621 Acute and chronic respiratory failure with hypoxia: Secondary | ICD-10-CM

## 2014-06-28 DIAGNOSIS — I270 Primary pulmonary hypertension: Secondary | ICD-10-CM

## 2014-06-28 DIAGNOSIS — T460X3A Poisoning by cardiac-stimulant glycosides and drugs of similar action, assault, initial encounter: Secondary | ICD-10-CM

## 2014-06-28 DIAGNOSIS — I272 Pulmonary hypertension, unspecified: Secondary | ICD-10-CM | POA: Insufficient documentation

## 2014-06-28 DIAGNOSIS — C3400 Malignant neoplasm of unspecified main bronchus: Secondary | ICD-10-CM

## 2014-06-28 DIAGNOSIS — I10 Essential (primary) hypertension: Secondary | ICD-10-CM | POA: Insufficient documentation

## 2014-06-28 DIAGNOSIS — E038 Other specified hypothyroidism: Secondary | ICD-10-CM | POA: Insufficient documentation

## 2014-06-28 DIAGNOSIS — R600 Localized edema: Secondary | ICD-10-CM

## 2014-06-28 DIAGNOSIS — D6852 Prothrombin gene mutation: Secondary | ICD-10-CM

## 2014-06-28 LAB — COMPREHENSIVE METABOLIC PANEL
ALT: 10 U/L (ref 0–35)
AST: 31 U/L (ref 0–37)
Albumin: 2.8 g/dL — ABNORMAL LOW (ref 3.5–5.2)
Alkaline Phosphatase: 101 U/L (ref 39–117)
Anion gap: 8 (ref 5–15)
BUN: 29 mg/dL — ABNORMAL HIGH (ref 6–23)
CALCIUM: 8.5 mg/dL (ref 8.4–10.5)
CO2: 39 mmol/L — AB (ref 19–32)
Chloride: 74 mEq/L — ABNORMAL LOW (ref 96–112)
Creatinine, Ser: 0.95 mg/dL (ref 0.50–1.10)
GFR calc Af Amer: 64 mL/min — ABNORMAL LOW (ref >90)
GFR calc non Af Amer: 55 mL/min — ABNORMAL LOW (ref >90)
Glucose, Bld: 67 mg/dL — ABNORMAL LOW (ref 70–99)
POTASSIUM: 4.5 mmol/L (ref 3.5–5.1)
SODIUM: 121 mmol/L — AB (ref 135–145)
TOTAL PROTEIN: 5.3 g/dL — AB (ref 6.0–8.3)
Total Bilirubin: 0.8 mg/dL (ref 0.3–1.2)

## 2014-06-28 LAB — GLUCOSE, CAPILLARY
GLUCOSE-CAPILLARY: 124 mg/dL — AB (ref 70–99)
GLUCOSE-CAPILLARY: 125 mg/dL — AB (ref 70–99)
GLUCOSE-CAPILLARY: 78 mg/dL (ref 70–99)
GLUCOSE-CAPILLARY: 96 mg/dL (ref 70–99)
Glucose-Capillary: 71 mg/dL (ref 70–99)
Glucose-Capillary: 61 mg/dL — ABNORMAL LOW (ref 70–99)
Glucose-Capillary: 86 mg/dL (ref 70–99)

## 2014-06-28 LAB — URINE CULTURE
COLONY COUNT: NO GROWTH
Culture: NO GROWTH

## 2014-06-28 LAB — DIGOXIN LEVEL: Digoxin Level: 1.1 ng/mL (ref 0.8–2.0)

## 2014-06-28 MED ORDER — CARVEDILOL 3.125 MG PO TABS
3.1250 mg | ORAL_TABLET | Freq: Two times a day (BID) | ORAL | Status: DC
  Administered 2014-06-28 – 2014-07-06 (×15): 3.125 mg via ORAL
  Filled 2014-06-28 (×21): qty 1

## 2014-06-28 NOTE — Progress Notes (Signed)
[name]  Brittney Thomas WFU:[id number] DOB: 1932/07/04 DOA: 06/26/2014 PCP: [name], MD  Brief narrative: 80 year old female w/a history of metastatic adenocarcinoma of the lung and a recent admission for hemorrhagic shock due to rectus sheath hematoma in setting of anticoagulation (at time of discharge anticoagulation, ACE inhibitors and Lasix were discontinued because of acute kidney injury and recent relative hypotension) who was found to have altered mentation and was sent to the ER where her sodium was noted to be 120 (sodium was 132 at discharge). In review of outpatient records pulmonary medicine had recently resumed low-dose Lasix on 12/15 due to shortness of breath and increasing lower extremity edema. ABG in the ER revealed hypercarbia and hypoxemia so BiPAP was initiated. Of note patient recently started chemotherapy for her metastatic adenocarcinoma with oral medications. In addition to the above findings patient's urine was highly abnormal and concerning for urinary tract infection. Her urine specific gravity was also low at 1.008. A digoxin level was elevated at 3.2.  HPI/Subjective: Alert, says breathing better, no complaints such as CP or SOB, no abdominal pain.  Assessment/Plan:    Acute encephalopathy -Suspect related to hyponatremia as well as possible urinary tract infection - resolved     Hyponatremia -Suspect volume overload - Urine sodium 54, urine and serum osmolality low, urine specific gravity low, patient was significant edema-diuresing but Na has not increased-cont Lasix 80 mg IV every 12 hours and follow output-avoid rapid overcorrection-may benefit from [place]    Acute and chronic respiratory failure with hypoxia -Secondary to volume overload in setting of pulmonary hypertension and TR and progressive lower extremity edema-continue supportive care     Bilateral lower extremity edema -Progressive over several  weeks-albumin only mildly low at 2.8-preserved LV function on echocardiogram in November but patient does have severe pulmonary hypertension with moderate to severe TR (therefore pre-load  dependent); surprisingly RV systolic function normal    Severe pulmonary hypertension -See above    Digoxin toxicity -Digoxin on hold-repeat level 1.1-given renal dysfunction acutely (with mild CKD 2) will cont to hold-borderline bradycardia has resolved-QTc 335 ms    Primary hypercoagulable state -Anticoagulation discontinued after last admission in setting of significant rectus sheath hematoma with hemorrhagic shock    Chronic atrial fibrillation -Currently rate controlled with intermittent bradycardia rates had been down into the 40-50 range but now in the 70s-dig toxicity as above-resume low dose Coreg    Abnormal urinalysis -Appeared consistent with UTI but urine culture neg so will discont empiric antibiotics    Lung cancer, main bronchus -Followed by Dr. Earlie Server -Notify Dr. Curt Bears in the a.m. that patient has been admitted    Benign essential HTN -Blood pressure controlled    Hypothyroidism -Continue Synthroid-TSH normal     DVT prophylaxis: SCDs Code Status: Full Family Communication: No family at bedside Disposition Plan/Expected LOS: Stepdown until Na normalized   Consultants: None  Procedures: None  Antibiotics: Rocephin 12/27 >  Objective: Blood pressure 119/68, pulse 61, temperature 97.5 F (36.4 C), temperature source Oral, resp. rate 27, height 5\' 7"  (1.702 m), weight 195 lb 9.6 oz (88.724 kg), SpO2 100 %.  Intake/Output Summary (Last 24 hours) at 06/28/14 1341 Last data filed at 06/28/14 1317  Gross per 24 hour  Intake     50 ml  Output   2225 ml  Net  -2175 ml   Exam: Gen: No acute respiratory distress Chest: Clear to auscultation bilaterally without  wheezes, rhonchi or crackles, 5 L Cardiac: Regular rate and rhythm, S1-S2 except for 2/6 systolic  murmur RSB, no rubs or gallops, no peripheral edema, no JVD Abdomen: Soft nontender nondistended without obvious hepatosplenomegaly, no ascites Extremities: Symmetrical in appearance without cyanosis, clubbing or effusion  Scheduled Meds:  Scheduled Meds: . carvedilol  3.125 mg Oral BID WC  . cefTRIAXone (ROCEPHIN)  IV  1 g Intravenous Q24H  . furosemide  80 mg Intravenous Q12H  . levothyroxine  44 mcg Intravenous Daily  . senna-docusate  1 tablet Oral BID  . sodium chloride  3 mL Intravenous Q12H   Data Reviewed: Basic Metabolic Panel:  Recent Labs Lab 06/26/14 2352 06/27/14 0623 06/27/14 1000 06/28/14 0359  NA 120* 121* 121* 121*  K 5.0 4.6 4.8 4.5  CL 75* 76* 76* 74*  CO2 36* 35* 37* 39*  GLUCOSE 87 88 80 67*  BUN 25* 26* 25* 29*  CREATININE 1.01 0.92 0.90 0.95  CALCIUM 8.8 8.6 8.5 8.5   Liver Function Tests:  Recent Labs Lab 06/26/14 2352 06/27/14 0623 06/28/14 0359  AST 31 29 31   ALT 17 16 10   ALKPHOS 116 109 101  BILITOT 1.0 1.2 0.8  PROT 6.1 5.9* 5.3*  ALBUMIN 3.0* 2.8* 2.8*    Recent Labs Lab 06/27/14 0623  AMMONIA 38*   CBC:  Recent Labs Lab 06/26/14 2352 06/27/14 0623  WBC 9.9 10.4  NEUTROABS 7.7 8.0*  HGB 10.3* 10.1*  HCT 32.6* 31.6*  MCV 99.7 99.1  PLT 188 171   Cardiac Enzymes:  Recent Labs Lab 06/26/14 2352  TROPONINI 0.05*   CBG:  Recent Labs Lab 06/28/14 0003 06/28/14 0352 06/28/14 0756 06/28/14 1034 06/28/14 1156  GLUCAP 78 71 61* 86 96    Studies:  Recent x-ray studies have been reviewed in detail by the Attending Physician  Time spent :  No charge  Erin Hearing, ANP [facility] Office  [phone] Pager [phone]  On-Call/Text Page:      [email or web address]      password [email or web address]  If 7PM-7AM, please contact night-coverage [email or web address] Password [email or web address] 06/28/2014, 1:41 PM   LOS: 2 days   Examined Patient and discussed A&P with ANP Ebony Hail and agree with above plan.  I have reviewed the entire  database. I have made any necessary editorial changes, and agree with its content. Pt with Multiple Complex medical problems> 35 min spent in direct Pt care   Dia Crawford, MD  [facility] [phone] pager

## 2014-06-28 NOTE — Clinical Social Work Note (Signed)
CSW spoke with patient at bedside- patient confirms that she is from [facility] but stated that her daughter had said the patient would not be returning there.  CSW attempted to call patients daughter, who patient states she was living with for several months now, to discuss return to [facility] vs bed search for alternative SNF vs home health- left message.  CSW will continue to follow.  Domenica Reamer, [place] Social Worker [phone]

## 2014-06-28 NOTE — Progress Notes (Addendum)
06/28/2014 foley was removed at 1300. Patient had clear yellow urine. Patient had 75 cc out, because foley was leaking. [facility] RN.

## 2014-06-29 ENCOUNTER — Telehealth: Payer: Self-pay | Admitting: Internal Medicine

## 2014-06-29 DIAGNOSIS — T460X1D Poisoning by cardiac-stimulant glycosides and drugs of similar action, accidental (unintentional), subsequent encounter: Secondary | ICD-10-CM

## 2014-06-29 DIAGNOSIS — R8290 Unspecified abnormal findings in urine: Secondary | ICD-10-CM

## 2014-06-29 LAB — GLUCOSE, CAPILLARY
Glucose-Capillary: 110 mg/dL — ABNORMAL HIGH (ref 70–99)
Glucose-Capillary: 132 mg/dL — ABNORMAL HIGH (ref 70–99)
Glucose-Capillary: 149 mg/dL — ABNORMAL HIGH (ref 70–99)
Glucose-Capillary: 151 mg/dL — ABNORMAL HIGH (ref 70–99)
Glucose-Capillary: 88 mg/dL (ref 70–99)
Glucose-Capillary: 88 mg/dL (ref 70–99)

## 2014-06-29 LAB — BASIC METABOLIC PANEL
ANION GAP: 8 (ref 5–15)
BUN: 28 mg/dL — ABNORMAL HIGH (ref 6–23)
CO2: 41 mmol/L (ref 19–32)
Calcium: 8.5 mg/dL (ref 8.4–10.5)
Chloride: 75 mEq/L — ABNORMAL LOW (ref 96–112)
Creatinine, Ser: 0.96 mg/dL (ref 0.50–1.10)
GFR calc Af Amer: 64 mL/min — ABNORMAL LOW (ref >90)
GFR calc non Af Amer: 55 mL/min — ABNORMAL LOW (ref >90)
GLUCOSE: 94 mg/dL (ref 70–99)
POTASSIUM: 4.2 mmol/L (ref 3.5–5.1)
SODIUM: 124 mmol/L — AB (ref 135–145)

## 2014-06-29 MED ORDER — FOLIC ACID 1 MG PO TABS
1.0000 mg | ORAL_TABLET | Freq: Every day | ORAL | Status: DC
  Administered 2014-06-29 – 2014-07-07 (×9): 1 mg via ORAL
  Filled 2014-06-29 (×9): qty 1

## 2014-06-29 MED ORDER — CEFTRIAXONE SODIUM IN DEXTROSE 20 MG/ML IV SOLN
1.0000 g | INTRAVENOUS | Status: DC
  Administered 2014-06-29 – 2014-07-03 (×5): 1 g via INTRAVENOUS
  Filled 2014-06-29 (×8): qty 50

## 2014-06-29 MED ORDER — LEVOTHYROXINE SODIUM 88 MCG PO TABS
88.0000 ug | ORAL_TABLET | Freq: Every day | ORAL | Status: DC
  Administered 2014-06-29 – 2014-07-07 (×9): 88 ug via ORAL
  Filled 2014-06-29 (×12): qty 1

## 2014-06-29 NOTE — Progress Notes (Addendum)
06/29/2014 Jennefer Bravo from [facility] called at 1040. Blood culture which was collected on 06/27/2014 the aerobic bottled growing gram positive cocci in clusters. Erin Hearing (NP)  Was made aware. [facility] RN.

## 2014-06-29 NOTE — Telephone Encounter (Signed)
returned pt call and cx appt due to pt in hospital....lvm to confirm cancellation

## 2014-06-29 NOTE — Progress Notes (Signed)
CRITICAL VALUE ALERT  Critical value received:  Blood culture collected on 06/27/2014 the aerobic bottle gram positive cocci in cluster  Date of notification:  06/29/2014  Time of notification:  4171  Critical value read back:Yes.    Nurse who received alert:  Rico Sheehan zrn  MD notified (1st page):  Erin Hearing (NP)  Time of first page:  1100  MD notified (2nd page):  Time of second page:  Responding MD:  Did not call back, but RN eventually call NP at 1300 she stated she looked at the result.  Time MD responded:  1315.

## 2014-06-29 NOTE — Clinical Social Work Psychosocial (Signed)
Clinical Social Work Department BRIEF PSYCHOSOCIAL ASSESSMENT 06/29/2014  Patient:  Brittney Thomas, Brittney Thomas     Account Number:  [id number]     Admit date:  06/26/2014  Clinical Social Worker:  Domenica Reamer, CLINICAL SOCIAL WORKER  Date/Time:  06/29/2014 02:30 PM  Referred by:  Physician  Date Referred:  06/29/2014 Referred for  SNF Placement   Other Referral:   Interview type:  Patient Other interview type:   also spoke with patients son, [name]    PSYCHOSOCIAL DATA Living Status:  FACILITY Admitted from facility:  [facility] Level of care:  [place] Primary support name:  [name] and [name] Primary support relationship to patient:  CHILD, ADULT Degree of support available:   Patient reports high level of support    CURRENT CONCERNS Current Concerns  Post-Acute Placement   Other Concerns:   d    SOCIAL WORK ASSESSMENT / PLAN CSW spoke with patient concerning return to [facility] when ready to DC.  Patient stated she woudl like CSW to talk with her children to determine plan.  CSW spoke with patients son, [name], who stated that he would prefer the patient go to [facility] where patient has received rehab in the past and did well there.  CSW will continue to follow.   Assessment/plan status:  Psychosocial Support/Ongoing Assessment of Needs Other assessment/ plan:   FL2 update   Information/referral to community resources:    PATIENT'S/FAMILY'S RESPONSE TO PLAN OF CARE: Patients family is agreeable to patient return to SNF but would feel better about patient going to [facility].       Domenica Reamer, [place] Social Worker [phone]

## 2014-06-29 NOTE — Clinical Social Work Placement (Signed)
Clinical Social Work Department CLINICAL SOCIAL WORK PLACEMENT NOTE 06/29/2014  Patient:  Brittney Thomas, Brittney Thomas  Account Number:  [id number] Admit date:  06/26/2014  Clinical Social Worker:  Domenica Reamer, CLINICAL SOCIAL WORKER  Date/time:  06/29/2014 02:34 PM  Clinical Social Work is seeking post-discharge placement for this patient at the following level of care:   SKILLED NURSING   (*CSW will update this form in Epic as items are completed)   06/29/2014  Patient/family provided with [place] Department of Clinical Social Work's list of facilities offering this level of care within the geographic area requested by the patient (or if unable, by the patient's family).  06/29/2014  Patient/family informed of their freedom to choose among providers that offer the needed level of care, that participate in Medicare, Medicaid or managed care program needed by the patient, have an available bed and are willing to accept the patient.  06/29/2014  Patient/family informed of [facility]' ownership interest in [facility], as well as of the fact that they are under no obligation to receive care at this facility.  PASARR submitted to EDS on 06/29/2014 PASARR number received on 06/29/2014  FL2 transmitted to all facilities in geographic area requested by pt/family on  06/29/2014 FL2 transmitted to all facilities within larger geographic area on   Patient informed that his/her managed care company has contracts with or will negotiate with  certain facilities, including the following:     Patient/family informed of bed offers received:   Patient chooses bed at [facility] Physician recommends and patient chooses bed at    Patient to be transferred to [facility] on   Patient to be transferred to facility by  Patient and family notified of transfer on  Name of family member notified:    The following physician request were entered in Epic:   Additional  Comments: Domenica Reamer, [place] Social Worker [phone]

## 2014-06-29 NOTE — Progress Notes (Signed)
[facility] 1 - Stepdown / ICU Progress Note  Brittney Thomas:[id number] DOB: [birth year] DOA: 06/26/2014 PCP: [name], MD  Brief narrative: 80 year old female w/a history of metastatic adenocarcinoma of the lung and a recent admission for hemorrhagic shock due to rectus sheath hematoma in setting of anticoagulation (at time of discharge anticoagulation, ACE inhibitors, and Lasix were discontinued because of acute kidney injury and recent relative hypotension) who was found to have altered mentation and was sent to the ER where her sodium was noted to be 120 (sodium was 132 at discharge). In review of outpatient records pulmonary medicine had recently resumed low-dose Lasix on 12/15 due to shortness of breath and increasing lower extremity edema. ABG in the ER revealed hypercarbia and hypoxemia so BiPAP was initiated. Of note patient recently started chemotherapy for her metastatic adenocarcinoma with oral medications. In addition to the above findings patient's urine was highly abnormal and concerning for urinary tract infection. A digoxin level was elevated at 3.2.  HPI/Subjective: Alert, No CP or SOB  Assessment/Plan:  Acute encephalopathy -Suspect was related to hyponatremia  - resolved  Hyponatremia -Suspect volume overload - diuresing with resultant increase in Na - cont Lasix 80 mg IV every 12 hours and follow output - avoid rapid overcorrection  Acute and chronic respiratory failure with hypoxia -Secondary to volume overload in setting of pulmonary hypertension and TR and progressive lower extremity edema - continue supportive care  Bilateral lower extremity edema -Progressive over several weeks - albumin low at 2.8 - preserved LV function on echocardiogram in November but patient does have severe pulmonary hypertension with moderate to severe TR (therefore pre-load dependent); surprisingly RV systolic function normal  Severe pulmonary hypertension -See  above  Abnormal blood cx's -1/2 positive for GPCs in clusters - suspect contaminant- follow for speciation   Digoxin toxicity w/ bradycardia  -Digoxin discontinued - repeat level 1.1 s/p digifab - given renal dysfunction acutely (with mild CKD 2) will cont to hold - bradycardia has resolved - QTc 335 ms  Recent spontaneous hemorrhage  -Anticoagulation discontinued after last admission in setting of significant rectus sheath hematoma with hemorrhagic shock  Chronic atrial fibrillation -Currently rate controlled with intermittent bradycardia - had been down into the 40-50 range but now in the 70s to 80s - dig toxicity as above - cont low dose Coreg  UTI -UA convincing for UTI, but culture not sucessful in identifying pathogen - complete 5 days of empiric abx tx  Lung cancer, main bronchus -Followed by Dr. Earlie Server - no acute issues r/t this dx   Benign essential HTN -Blood pressure controlled  Hypothyroidism -Continue Synthroid-TSH normal  DVT prophylaxis: SCDs Code Status: Full Family Communication: No family at bedside Disposition Plan/Expected LOS: Stepdown until Na normalized  Consultants: None  Procedures: None  Antibiotics: Rocephin 12/27 >   Objective: Blood pressure 91/62, pulse 85, temperature 97.1 F (36.2 C), temperature source Oral, resp. rate 22, height 5\' 8"  (1.727 m), weight 188 lb 1.5 oz (85.32 kg), SpO2 98 %.  Intake/Output Summary (Last 24 hours) at 06/29/14 1308 Last data filed at 06/29/14 1258  Gross per 24 hour  Intake    490 ml  Output   1275 ml  Net   -785 ml   Exam: Gen: No acute respiratory distress Chest: Clear to auscultation bilaterally without wheezes, rhonchi or crackles, 4 L Cardiac: Regular rate and rhythm, S1-S2 except for 2/6 systolic murmur RSB, no rubs or gallops Abdomen: Soft nontender nondistended without  obvious hepatosplenomegaly, no ascites Extremities: Symmetrical in appearance without cyanosis, clubbing or peripheral  edema  Scheduled Meds:  Scheduled Meds: . carvedilol  3.125 mg Oral BID WC  . furosemide  80 mg Intravenous Q12H  . levothyroxine  88 mcg Oral QAC breakfast  . senna-docusate  1 tablet Oral BID  . sodium chloride  3 mL Intravenous Q12H   Data Reviewed: Basic Metabolic Panel:  Recent Labs Lab 06/26/14 2352 06/27/14 0623 06/27/14 1000 06/28/14 0359 06/29/14 0340  NA 120* 121* 121* 121* 124*  K 5.0 4.6 4.8 4.5 4.2  CL 75* 76* 76* 74* 75*  CO2 36* 35* 37* 39* 41*  GLUCOSE 87 88 80 67* 94  BUN 25* 26* 25* 29* 28*  CREATININE 1.01 0.92 0.90 0.95 0.96  CALCIUM 8.8 8.6 8.5 8.5 8.5   Liver Function Tests:  Recent Labs Lab 06/26/14 2352 06/27/14 0623 06/28/14 0359  AST 31 29 31   ALT 17 16 10   ALKPHOS 116 109 101  BILITOT 1.0 1.2 0.8  PROT 6.1 5.9* 5.3*  ALBUMIN 3.0* 2.8* 2.8*    Recent Labs Lab 06/27/14 0623  AMMONIA 38*   CBC:  Recent Labs Lab 06/26/14 2352 06/27/14 0623  WBC 9.9 10.4  NEUTROABS 7.7 8.0*  HGB 10.3* 10.1*  HCT 32.6* 31.6*  MCV 99.7 99.1  PLT 188 171   Cardiac Enzymes:  Recent Labs Lab 06/26/14 2352  TROPONINI 0.05*   CBG:  Recent Labs Lab 06/28/14 1952 06/28/14 2350 06/29/14 0634 06/29/14 0743 06/29/14 1130  GLUCAP 124* 110* 88 88 132*    Studies:  Recent x-ray studies have been reviewed in detail by the Attending Physician  Time spent :  30 mins  Erin Hearing, ANP [facility] Office  [phone] Pager [phone]  On-Call/Text Page:      [email or web address]      password [email or web address]  If 7PM-7AM, please contact night-coverage [email or web address] Password [email or web address] 06/29/2014, 1:08 PM   LOS: 3 days   I have personally examined this patient and reviewed the entire database. I have reviewed the above note, made any necessary editorial changes, and agree with its content.  Cherene Altes, MD [facility]

## 2014-06-29 NOTE — Clinical Social Work Note (Addendum)
11:30am Patients on called back CSW and informed that they would like patient to go to [facility] where patient has been for rehab in the past.  [facility] faxed clinicals to [facility] for review.  11:10am CSW called patients son and daughter- no answer, left message.  CSW will continue to follow to determine patients DC plan.  Domenica Reamer, [place] Social Worker [phone]

## 2014-06-30 ENCOUNTER — Other Ambulatory Visit: Payer: Medicare Other

## 2014-06-30 ENCOUNTER — Ambulatory Visit: Payer: Medicare Other | Admitting: Physician Assistant

## 2014-06-30 DIAGNOSIS — T460X1A Poisoning by cardiac-stimulant glycosides and drugs of similar action, accidental (unintentional), initial encounter: Secondary | ICD-10-CM

## 2014-06-30 DIAGNOSIS — I272 Pulmonary hypertension, unspecified: Secondary | ICD-10-CM | POA: Insufficient documentation

## 2014-06-30 DIAGNOSIS — K661 Hemoperitoneum: Secondary | ICD-10-CM

## 2014-06-30 DIAGNOSIS — R58 Hemorrhage, not elsewhere classified: Secondary | ICD-10-CM | POA: Insufficient documentation

## 2014-06-30 DIAGNOSIS — C3402 Malignant neoplasm of left main bronchus: Secondary | ICD-10-CM

## 2014-06-30 DIAGNOSIS — N390 Urinary tract infection, site not specified: Secondary | ICD-10-CM

## 2014-06-30 LAB — CREATININE, URINE, RANDOM: Creatinine, Urine: 16.37 mg/dL

## 2014-06-30 LAB — CULTURE, BLOOD (ROUTINE X 2)

## 2014-06-30 LAB — BASIC METABOLIC PANEL
Anion gap: 9 (ref 5–15)
BUN: 24 mg/dL — AB (ref 6–23)
CHLORIDE: 74 meq/L — AB (ref 96–112)
CO2: 41 mmol/L — AB (ref 19–32)
Calcium: 8.3 mg/dL — ABNORMAL LOW (ref 8.4–10.5)
Creatinine, Ser: 0.9 mg/dL (ref 0.50–1.10)
GFR calc Af Amer: 69 mL/min — ABNORMAL LOW (ref >90)
GFR calc non Af Amer: 59 mL/min — ABNORMAL LOW (ref >90)
GLUCOSE: 101 mg/dL — AB (ref 70–99)
Potassium: 4 mmol/L (ref 3.5–5.1)
Sodium: 124 mmol/L — ABNORMAL LOW (ref 135–145)

## 2014-06-30 LAB — URIC ACID: URIC ACID, SERUM: 6.5 mg/dL (ref 2.4–7.0)

## 2014-06-30 LAB — GLUCOSE, CAPILLARY
Glucose-Capillary: 131 mg/dL — ABNORMAL HIGH (ref 70–99)
Glucose-Capillary: 103 mg/dL — ABNORMAL HIGH (ref 70–99)
Glucose-Capillary: 88 mg/dL (ref 70–99)

## 2014-06-30 LAB — UREA NITROGEN, URINE: Urea Nitrogen, Ur: 189 mg/dL

## 2014-06-30 LAB — OSMOLALITY: Osmolality: 268 mOsm/kg — ABNORMAL LOW (ref 275–300)

## 2014-06-30 LAB — OSMOLALITY, URINE: OSMOLALITY UR: 141 mosm/kg — AB (ref 390–1090)

## 2014-06-30 NOTE — Progress Notes (Signed)
Patient trasfered from 2C02 to [phone] via chair; alert and oriented x 4; no complaints of pain; IV saline locked in LFA; skin intact; edema on bilateral lower extremities (+1). Orient patient to room and unit; instructed how to use the call bell and  fall risk precautions. Will continue to monitor the patient.

## 2014-06-30 NOTE — Clinical Social Work Note (Addendum)
11:15am CSW informed patients son of bed offer at [facility]- son agreeable.  CSW informed [facility] that patient will likely DC early next week.  8:30am Patient has bed available at [facility] when medically stable for discharge.  CSW will continue to follow.  Domenica Reamer, [place] Social Worker [phone]

## 2014-06-30 NOTE — Progress Notes (Signed)
[name]  Brittney Thomas GMW:[id number] DOB: January 22, 1933 DOA: 06/26/2014 PCP: [name], MD  Brief narrative: 80 year old WF PMHx metastatic adenocarcinoma of the lung and a recent admission for hemorrhagic shock due to rectus sheath hematoma in setting of anticoagulation (at time of discharge anticoagulation, ACE inhibitors and Lasix were discontinued because of acute kidney injury and recent relative hypotension) who was found to have altered mentation and was sent to the ER where her sodium was noted to be 120 (sodium was 132 at discharge). In review of outpatient records pulmonary medicine had recently resumed low-dose Lasix on 12/15 due to shortness of breath and increasing lower extremity edema. ABG in the ER revealed hypercarbia and hypoxemia so BiPAP was initiated. Of note patient recently started chemotherapy for her metastatic adenocarcinoma with oral medications. In addition to the above findings patient's urine was highly abnormal and concerning for urinary tract infection. Her urine specific gravity was also low at 1.008. A digoxin level was elevated at 3.2.  Since admission she has been diuresed with every 12 hours Lasix with slow improvement in her sodium although sodium has not returned to baseline. As a precaution a modified SIADH workup has been initiated.  HPI/Subjective: Alert, sitting up in bed eating breakfast. No shortness of breath or pain complaints verbalized.  Assessment/Plan:  Acute encephalopathy -Suspect was related to hyponatremia  - resolved  Hyponatremia -Suspect volume overload and possible SIADH- diuresing with resultant increase in Na - cont Lasix 80 mg IV every 12 hours and follow output - avoiding rapid overcorrection -SIADH labs pending -FeUrea labs pending  Acute and chronic respiratory failure with hypoxia -Secondary to volume overload in setting of pulmonary hypertension and TR and progressive lower  extremity edema - continue supportive care  Bilateral lower extremity edema -Progressive over several weeks - albumin low at 2.8 - preserved LV function on echocardiogram in November but patient does have severe pulmonary hypertension with moderate to severe TR (therefore pre-load dependent); surprisingly RV systolic function normal  Severe pulmonary hypertension -See above  Abnormal blood cx's/micrococcus -1/2 positive for micrococcus - suspect contaminant but will cont Rocephin for now (currently being utilized for abnormal urinalysis consistent with UTI); last day of Rocephin should be 07/01/2014  Digoxin toxicity w/ bradycardia  -Digoxin discontinued - repeat level 1.1 s/p digifab - given renal dysfunction acutely (with mild CKD 2) will cont to hold - bradycardia has resolved - QTc stable at 335 ms  Recent spontaneous hemorrhage  -Anticoagulation discontinued after last admission in setting of significant rectus sheath hematoma with hemorrhagic shock  Chronic atrial fibrillation -Currently rate controlled with intermittent bradycardia - had been down into the 40-50 range but now in the 70s to 80s - dig toxicity as above - cont low dose Coreg  UTI -UA convincing for UTI, but culture not sucessful in identifying pathogen - complete 5 days of empiric abx tx  Lung cancer, main bronchus -Followed by Dr. Earlie Server - no acute issues r/t this dx   Benign essential HTN -Blood pressure controlled  Hypothyroidism -Continue Synthroid-TSH normal  DVT prophylaxis: SCDs Code Status: Full Family Communication: No family at bedside Disposition Plan/Expected LOS: Transfer to floor-remain inpatient until sodium normalized/clarification if truly SIADH-return to skilled nursing facility when medically stable  Consultants: None  Procedures: None  Antibiotics: Rocephin 12/27 >   Objective: Blood pressure 118/65, pulse 74, temperature 97.4 F (36.3 C), temperature source Oral, resp. rate 16,  height 5\' 8"  (  1.727 m), weight 188 lb 0.8 oz (85.3 kg), SpO2 100 %.  Intake/Output Summary (Last 24 hours) at 06/30/14 7116 Last data filed at 06/30/14 0459  Gross per 24 hour  Intake    750 ml  Output   1075 ml  Net   -325 ml   Exam: Gen: No acute respiratory distress Chest: Clear to auscultation bilaterally, 4 L Cardiac: Irregular rate and rhythm, S1-S2 except for 2/6 systolic murmur RSB, no rubs or gallops Abdomen: Soft nontender nondistended without obvious hepatosplenomegaly, no ascites Extremities: Symmetrical in appearance without cyanosis, clubbing or peripheral edema  Scheduled Meds:  Scheduled Meds: . carvedilol  3.125 mg Oral BID WC  . cefTRIAXone (ROCEPHIN)  IV  1 g Intravenous Q24H  . folic acid  1 mg Oral Daily  . furosemide  80 mg Intravenous Q12H  . levothyroxine  88 mcg Oral QAC breakfast  . senna-docusate  1 tablet Oral BID  . sodium chloride  3 mL Intravenous Q12H   Data Reviewed: Basic Metabolic Panel:  Recent Labs Lab 06/27/14 0623 06/27/14 1000 06/28/14 0359 06/29/14 0340 06/30/14 0333  NA 121* 121* 121* 124* 124*  K 4.6 4.8 4.5 4.2 4.0  CL 76* 76* 74* 75* 74*  CO2 35* 37* 39* 41* 41*  GLUCOSE 88 80 67* 94 101*  BUN 26* 25* 29* 28* 24*  CREATININE 0.92 0.90 0.95 0.96 0.90  CALCIUM 8.6 8.5 8.5 8.5 8.3*   Liver Function Tests:  Recent Labs Lab 06/26/14 2352 06/27/14 0623 06/28/14 0359  AST 31 29 31   ALT 17 16 10   ALKPHOS 116 109 101  BILITOT 1.0 1.2 0.8  PROT 6.1 5.9* 5.3*  ALBUMIN 3.0* 2.8* 2.8*    Recent Labs Lab 06/27/14 0623  AMMONIA 38*   CBC:  Recent Labs Lab 06/26/14 2352 06/27/14 0623  WBC 9.9 10.4  NEUTROABS 7.7 8.0*  HGB 10.3* 10.1*  HCT 32.6* 31.6*  MCV 99.7 99.1  PLT 188 171   Cardiac Enzymes:  Recent Labs Lab 06/26/14 2352  TROPONINI 0.05*   CBG:  Recent Labs Lab 06/29/14 1616 06/29/14 1939 06/30/14 06/30/14 0453 06/30/14 0749  GLUCAP 149* 151* 131* 103* 88    Studies:  Recent x-ray  studies have been reviewed in detail by the Attending Physician  Time spent :  [place] mins  Erin Hearing, ANP [facility] Office  [phone] Pager [phone]  On-Call/Text Page:      [email or web address]      password [email or web address]  If 7PM-7AM, please contact night-coverage [email or web address] Password [email or web address] 06/30/2014, 9:26 AM   LOS: 4 days  Examined Patient and discussed A&P with ANP Ebony Hail and agree with above plan. I have reviewed the entire database. I have made any necessary editorial changes, and agree with its content. Pt with Multiple Complex medical problems> 35 min spent in direct Pt care  Dia Crawford, MD [facility] [phone] pager

## 2014-06-30 NOTE — Progress Notes (Signed)
CRITICAL VALUE ALERT  Critical value received:  CO2 41  Date of notification:  06/30/14  Time of notification:  0630  Critical value read back: yes  Nurse who received alert:Jamey Demchak Leticia Clas  MD notified (1st page):  Dr. Hilbert Bible  Time of first page: [phone]  MD notified (2nd page):  Time of second page:  Responding MD:    Time MD responded:

## 2014-07-01 LAB — BASIC METABOLIC PANEL
Anion gap: 9 (ref 5–15)
BUN: 22 mg/dL (ref 6–23)
CALCIUM: 8.4 mg/dL (ref 8.4–10.5)
CO2: 43 mmol/L — AB (ref 19–32)
Chloride: 74 mEq/L — ABNORMAL LOW (ref 96–112)
Creatinine, Ser: 0.85 mg/dL (ref 0.50–1.10)
GFR calc Af Amer: 74 mL/min — ABNORMAL LOW (ref >90)
GFR calc non Af Amer: 63 mL/min — ABNORMAL LOW (ref >90)
Glucose, Bld: 81 mg/dL (ref 70–99)
Potassium: 3.7 mmol/L (ref 3.5–5.1)
Sodium: 126 mmol/L — ABNORMAL LOW (ref 135–145)

## 2014-07-01 MED ORDER — GUAIFENESIN ER 600 MG PO TB12
600.0000 mg | ORAL_TABLET | Freq: Two times a day (BID) | ORAL | Status: DC | PRN
  Filled 2014-07-01: qty 1

## 2014-07-01 NOTE — Progress Notes (Signed)
CRITICAL VALUE ALERT  Critical value received:  CO2=43  Date of notification:  07/01/2014  Time of notification:  0809  Critical value read back:Yes.    Nurse who received alert:  Alphonsus Sias  MD notified (1st page):  Dr. Cruzita Lederer  Time of first page:  0810  MD notified (2nd page):  Time of second page:  Responding MD:  New orders  Time MD responded:  [phone]

## 2014-07-01 NOTE — Progress Notes (Signed)
PROGRESS NOTE  Brittney Thomas TKP:[id number] DOB: [birth year] DOA: 06/26/2014 PCP: [name], MD  HPI: 81 year old WF PMHx metastatic adenocarcinoma of the lung and a recent admission for hemorrhagic shock due to rectus sheath hematoma in setting of anticoagulation (at time of discharge anticoagulation, ACE inhibitors and Lasix were discontinued because of acute kidney injury and recent relative hypotension) who was found to have altered mentation and was sent to the ER where her sodium was noted to be 120 (sodium was 132 at discharge). In review of outpatient records pulmonary medicine had recently resumed low-dose Lasix on 12/15 due to shortness of breath and increasing lower extremity edema. ABG in the ER revealed hypercarbia and hypoxemia so BiPAP was initiated. Of note patient recently started chemotherapy for her metastatic adenocarcinoma with oral medications. In addition to the above findings patient's urine was highly abnormal and concerning for urinary tract infection. Her urine specific gravity was also low at 1.008. A digoxin level was elevated at 3.2. Since admission she has been diuresed with every 12 hours Lasix with slow improvement in her sodium although sodium has not returned to baseline.  Subjective / 24 H Interval events - feeling well this morning, no complaints   Assessment/Plan: Active Problems:   Primary hypercoagulable state   Lung cancer, main bronchus   Bilateral lower extremity edema   Acute and chronic respiratory failure with hypoxia   Benign essential HTN   Acute encephalopathy   Hyponatremia   Chronic atrial fibrillation   Abnormal urinalysis   Digoxin toxicity   Acute on chronic respiratory failure with hypoxia   Moderate to severe pulmonary hypertension   Essential hypertension, benign   Other specified hypothyroidism   Pulmonary hypertension, moderate to severe   Spontaneous hemorrhage   UTI (urinary tract infection), uncomplicated   Acute  encephalopathy - Suspect was related to hyponatremia - resolved, now back to baseline  Hyponatremia - Suspect volume overload - a component of SIADH given her low osmolality in the urine and serum, as well as the fact that she has a lung mass - Continue IV Lasix,  Acute on chronic respiratory failure with hypoxia - Secondary to volume overload in setting of pulmonary hypertension and TR and progressive lower extremity edema - continue supportive care  Bilateral lower extremity edema - Progressive over several weeks - albumin low at 2.8 - preserved LV function on echocardiogram in November but patient does have severe pulmonary hypertension with moderate to severe TR (therefore pre-load dependent); surprisingly RV systolic function normal - continue IV Lasix for today  Severe pulmonary hypertension - See above  Abnormal blood cx's/micrococcus - 1/2 positive for micrococcus - suspect contaminant but will cont Rocephin for now (currently being utilized for abnormal urinalysis consistent with UTI); last day of Rocephin should be 07/01/2014  Digoxin toxicity w/ bradycardia  - Digoxin discontinued - repeat level 1.1 s/p digifab - given renal dysfunction acutely (with mild CKD 2) will cont to hold - bradycardia has resolved - QTc stable at 335 ms  Recent spontaneous hemorrhage  - Anticoagulation discontinued after last admission in setting of significant rectus sheath hematoma with hemorrhagic shock  Chronic atrial fibrillation - Currently rate controlled with intermittent bradycardia - had been down into the 40-50 range but now in the 70s to 80s - dig toxicity as above - cont low dose Coreg  UTI - UA convincing for UTI, but culture not sucessful in identifying pathogen - complete 5 days of empiric abx tx  Lung cancer,  main bronchus - Followed by Dr. Earlie Server - no acute issues r/t this dx   Benign essential HTN - Blood pressure controlled  Hypothyroidism - Continue Synthroid - TSH  normal   Diet: DIET SOFT Fluids: none DVT Prophylaxis: SCDs   Code Status: Full Code Family Communication: d/w patient  Disposition Plan: SNF when ready    Consultants:  None   Procedures:  None    Antibiotics Ceftriaxone 12/27 >>   Studies  No results found.   Objective  Filed Vitals:   06/30/14 0933 06/30/14 1315 06/30/14 2109 07/01/14 0629  BP: 103/62 115/65 108/66 121/82  Pulse: 111 93 47 68  Temp: 97.5 F (36.4 C) 98.4 F (36.9 C) 97.8 F (36.6 C) 98 F (36.7 C)  TempSrc: Oral Oral Oral Oral  Resp: 16 20 18 18   Height: 5\' 7"  (1.702 m)     Weight: 85.3 kg (188 lb 0.8 oz)   84.7 kg (186 lb 11.7 oz)  SpO2: 100% 99% 100% 100%    Intake/Output Summary (Last 24 hours) at 07/01/14 0718 Last data filed at 07/01/14 0640  Gross per 24 hour  Intake    900 ml  Output   1850 ml  Net   -950 ml   Filed Weights   06/30/14 0454 06/30/14 0933 07/01/14 0629  Weight: 85.3 kg (188 lb 0.8 oz) 85.3 kg (188 lb 0.8 oz) 84.7 kg (186 lb 11.7 oz)    Exam:  General:  No apparent distress, frail Caucasian female  HEENT: pupils reactive to light, no scleral icterus   Cardiovascular: RRR, 1-2 + LE edema  Respiratory: CTA biL  Abdomen: soft, non tender  MSK/Extremities: no clubbing  Skin: no rashes  Neuro: non focal   Data Reviewed: Basic Metabolic Panel:  Recent Labs Lab 06/27/14 0623 06/27/14 1000 06/28/14 0359 06/29/14 0340 06/30/14 0333  NA 121* 121* 121* 124* 124*  K 4.6 4.8 4.5 4.2 4.0  CL 76* 76* 74* 75* 74*  CO2 35* 37* 39* 41* 41*  GLUCOSE 88 80 67* 94 101*  BUN 26* 25* 29* 28* 24*  CREATININE 0.92 0.90 0.95 0.96 0.90  CALCIUM 8.6 8.5 8.5 8.5 8.3*   Liver Function Tests:  Recent Labs Lab 06/26/14 2352 06/27/14 0623 06/28/14 0359  AST 31 29 31   ALT 17 16 10   ALKPHOS 116 109 101  BILITOT 1.0 1.2 0.8  PROT 6.1 5.9* 5.3*  ALBUMIN 3.0* 2.8* 2.8*    Recent Labs Lab 06/27/14 0623  AMMONIA 38*   CBC:  Recent Labs Lab  06/26/14 2352 06/27/14 0623  WBC 9.9 10.4  NEUTROABS 7.7 8.0*  HGB 10.3* 10.1*  HCT 32.6* 31.6*  MCV 99.7 99.1  PLT 188 171   Cardiac Enzymes:  Recent Labs Lab 06/26/14 2352  TROPONINI 0.05*   CBG:  Recent Labs Lab 06/29/14 1616 06/29/14 1939 06/30/14 06/30/14 0453 06/30/14 0749  GLUCAP 149* 151* 131* 103* 88    Recent Results (from the past 240 hour(s))  Culture, blood (routine x 2)     Status: None (Preliminary result)   Collection Time: 06/27/14  1:24 AM  Result Value Ref Range Status   Specimen Description BLOOD RIGHT ANTECUBITAL  Final   Special Requests BOTTLES DRAWN AEROBIC AND ANAEROBIC 10CC EA  Final   Culture   Final           BLOOD CULTURE RECEIVED NO GROWTH TO DATE CULTURE WILL BE HELD FOR 5 DAYS BEFORE ISSUING A FINAL NEGATIVE REPORT Performed at [facility]    Report Status PENDING  Incomplete  Culture, blood (routine x 2)     Status: None   Collection Time: 06/27/14  1:39 AM  Result Value Ref Range Status   Specimen Description BLOOD RIGHT HAND  Final   Special Requests BOTTLES DRAWN AEROBIC AND ANAEROBIC 5CC EA  Final   Culture   Final    MICROCOCCUS SPECIES Note: Standardized susceptibility testing for this organism is not available. Note: Gram Stain Report Called to,Read Back By and Verified With: Rico Sheehan BY INGRAM A 06/29/14 1040AM Performed at [facility]    Report Status 06/30/2014 FINAL  Final  Urine culture     Status: None   Collection Time: 06/27/14  8:13 AM  Result Value Ref Range Status   Specimen Description URINE, CATHETERIZED  Final   Special Requests NONE  Final   Colony Count NO GROWTH Performed at [facility]   Final   Culture NO GROWTH Performed at [facility]   Final   Report Status 06/28/2014 FINAL  Final  MRSA PCR Screening     Status: None   Collection Time: 06/27/14  6:58 PM  Result Value Ref Range Status   MRSA by PCR NEGATIVE NEGATIVE Final    Comment:        The  GeneXpert MRSA Assay (FDA approved for NASAL specimens only), is one component of a comprehensive MRSA colonization surveillance program. It is not intended to diagnose MRSA infection nor to guide or monitor treatment for MRSA infections.      Scheduled Meds: . carvedilol  3.125 mg Oral BID WC  . cefTRIAXone (ROCEPHIN)  IV  1 g Intravenous Q24H  . folic acid  1 mg Oral Daily  . furosemide  80 mg Intravenous Q12H  . levothyroxine  88 mcg Oral QAC breakfast  . senna-docusate  1 tablet Oral BID  . sodium chloride  3 mL Intravenous Q12H   Continuous Infusions:   Marzetta Board, MD [facility] Pager [phone]. If 7 PM - 7 AM, please contact night-coverage at [email or web address], password [facility] 07/01/2014, 7:18 AM  LOS: 5 days

## 2014-07-02 LAB — COMPREHENSIVE METABOLIC PANEL
ALK PHOS: 112 U/L (ref 39–117)
ALT: 13 U/L (ref 0–35)
ANION GAP: 9 (ref 5–15)
AST: 28 U/L (ref 0–37)
Albumin: 2.9 g/dL — ABNORMAL LOW (ref 3.5–5.2)
BUN: 22 mg/dL (ref 6–23)
CO2: 43 mmol/L (ref 19–32)
Calcium: 8.3 mg/dL — ABNORMAL LOW (ref 8.4–10.5)
Chloride: 72 mEq/L — ABNORMAL LOW (ref 96–112)
Creatinine, Ser: 0.82 mg/dL (ref 0.50–1.10)
GFR calc Af Amer: 77 mL/min — ABNORMAL LOW (ref >90)
GFR calc non Af Amer: 66 mL/min — ABNORMAL LOW (ref >90)
GLUCOSE: 89 mg/dL (ref 70–99)
POTASSIUM: 3.6 mmol/L (ref 3.5–5.1)
Sodium: 124 mmol/L — ABNORMAL LOW (ref 135–145)
TOTAL PROTEIN: 5.7 g/dL — AB (ref 6.0–8.3)
Total Bilirubin: 1 mg/dL (ref 0.3–1.2)

## 2014-07-02 LAB — CBC
HCT: 29.8 % — ABNORMAL LOW (ref 36.0–46.0)
HEMOGLOBIN: 9.7 g/dL — AB (ref 12.0–15.0)
MCH: 33.8 pg (ref 26.0–34.0)
MCHC: 32.6 g/dL (ref 30.0–36.0)
MCV: 103.8 fL — AB (ref 78.0–100.0)
PLATELETS: 90 10*3/uL — AB (ref 150–400)
RBC: 2.87 MIL/uL — ABNORMAL LOW (ref 3.87–5.11)
RDW: 21.8 % — ABNORMAL HIGH (ref 11.5–15.5)
WBC: 4.5 10*3/uL (ref 4.0–10.5)

## 2014-07-02 MED ORDER — DIGOXIN 125 MCG PO TABS
0.1250 mg | ORAL_TABLET | Freq: Every day | ORAL | Status: DC
  Administered 2014-07-02 – 2014-07-06 (×5): 0.125 mg via ORAL
  Filled 2014-07-02 (×6): qty 1

## 2014-07-02 MED ORDER — FUROSEMIDE 40 MG PO TABS
40.0000 mg | ORAL_TABLET | Freq: Every day | ORAL | Status: DC
  Administered 2014-07-03: 40 mg via ORAL
  Filled 2014-07-02: qty 1

## 2014-07-02 NOTE — Evaluation (Signed)
Physical Therapy Evaluation Patient Details Name: Brittney Thomas MRN: [id number] DOB: July 22, 1932 Today's Date: 07/02/2014   History of Present Illness  81 yo CA pt with onset of hemorrhagic hematoma in rectus abd, now with UTI, acute encephalopathy and hyponatremia was readmitted from SNF.  PMHx:  lung CA on chemo, PVD, PE and PAF.  Clinical Impression  Pt was seen for assessing needs to follow her hospital care with family not available to ask about her PLOF.  Pt is a concern about her abiltiy to assist in deciding as she gives PT some conflicting answers to previous information on the previous PT eval information.  Will anticipate that she goes back to SNF as she was doing prior to readmission for this issue.    Follow Up Recommendations SNF;Supervision/Assistance - 24 hour    Equipment Recommendations  None recommended by PT    Recommendations for Other Services       Precautions / Restrictions Precautions Precautions: Fall Restrictions Weight Bearing Restrictions: No      Mobility  Bed Mobility Overal bed mobility: Needs Assistance Bed Mobility: Rolling;Supine to Sit Rolling: Min assist   Supine to sit: Mod assist;Max assist;HOB elevated     General bed mobility comments: bed pad to slide, rails HOB elevated and pt has slow processing   Transfers Overall transfer level: Needs assistance Equipment used: Rolling walker (2 wheeled) Transfers: Sit to/from [facility] Sit to Stand: Mod assist Stand pivot transfers: Mod assist;From elevated surface          Ambulation/Gait Ambulation/Gait assistance: Mod assist Ambulation Distance (Feet): 3 Feet Assistive device: Rolling walker (2 wheeled) Gait Pattern/deviations: Step-to pattern;Decreased step length - right;Decreased step length - left;Shuffle;Trunk flexed;Wide base of support Gait velocity: very slow effort Gait velocity interpretation: Below normal speed for age/gender    Stairs             Wheelchair Mobility    Modified Rankin (Stroke Patients Only)       Balance Overall balance assessment: Needs assistance Sitting-balance support: Feet supported Sitting balance-Leahy Scale: Fair Sitting balance - Comments: Sat on bed pan as pt declined to try the [facility] Postural control: Posterior lean Standing balance support: Bilateral upper extremity supported Standing balance-Leahy Scale: Poor                               Pertinent Vitals/Pain Pain Assessment: No/denies pain    Home Living Family/patient expects to be discharged to:: Skilled nursing facility Living Arrangements: Children Available Help at Discharge: Family Type of Home: House Home Access: Stairs to enter Entrance Stairs-Rails: None Entrance Stairs-Number of Steps:  (4) Home Layout: One level Home Equipment: Walker - 2 wheels;Walker - 4 wheels;Bedside commode      Prior Function Level of Independence: Independent with assistive device(s)   Gait / Transfers Assistance Needed: in house  ADL's / Homemaking Assistance Needed: family fixes light meals when not home        Hand Dominance        Extremity/Trunk Assessment   Upper Extremity Assessment: Overall WFL for tasks assessed           Lower Extremity Assessment: Generalized weakness      Cervical / Trunk Assessment: Normal  Communication   Communication: No difficulties  Cognition Arousal/Alertness: Awake/alert Behavior During Therapy: WFL for tasks assessed/performed Overall Cognitive Status: Within Functional Limits for tasks assessed  General Comments General comments (skin integrity, edema, etc.): Pt is somewhat a poor historian and have concern about her answers to PLOF but no family in    Exercises General Exercises - Lower Extremity Ankle Circles/Pumps: AROM;Both;5 reps Quad Sets: AROM;Both;10 reps Gluteal Sets: AROM;Both;10 reps      Assessment/Plan    PT Assessment  Patient needs continued PT services  PT Diagnosis Generalized weakness   PT Problem List Decreased strength;Decreased range of motion;Decreased activity tolerance;Decreased balance;Decreased mobility;Decreased coordination;Decreased knowledge of use of DME;Decreased safety awareness;Cardiopulmonary status limiting activity;Obesity  PT Treatment Interventions DME instruction;Gait training;Stair training;Functional mobility training;Therapeutic activities;Therapeutic exercise;Balance training;Neuromuscular re-education;Patient/family education   PT Goals (Current goals can be found in the Care Plan section) Acute Rehab PT Goals Patient Stated Goal: To get to new SNF and then home PT Goal Formulation: With patient Time For Goal Achievement: 07/16/14 Potential to Achieve Goals: Good    Frequency Min 2X/week   Barriers to discharge Decreased caregiver support family working and out at times    Co-evaluation               End of Glass blower/designer During Treatment: Oxygen Activity Tolerance: Patient tolerated treatment well Patient left: in chair;with call bell/phone within reach;with chair alarm set Nurse Communication: Mobility status         Time: 1000-1027 PT Time Calculation (min) (ACUTE ONLY): 27 min   Charges:   PT Evaluation $Initial PT Evaluation Tier I: 1 Procedure PT Treatments $Therapeutic Activity: 8-22 mins   PT G Codes:        Ramond Dial 2014-07-23, 11:00 AM   Mee Hives, PT MS Acute Rehab Dept. Number: [phone]

## 2014-07-02 NOTE — Plan of Care (Signed)
Problem: Acute Rehab PT Goals(only PT should resolve) Goal: Pt Will Transfer Bed To Chair/Chair To Bed On LRAD Goal: Pt Will Ambulate Level surfaces Goal: Pt Will Go Up/Down Stairs Without rails

## 2014-07-02 NOTE — Progress Notes (Signed)
PROGRESS NOTE  Brittney Thomas NLZ:[id number] DOB: 29-Oct-1932 DOA: 06/26/2014 PCP: [name], MD  HPI: 81 year old WF PMHx metastatic adenocarcinoma of the lung and a recent admission for hemorrhagic shock due to rectus sheath hematoma in setting of anticoagulation (at time of discharge anticoagulation, ACE inhibitors and Lasix were discontinued because of acute kidney injury and recent relative hypotension) who was found to have altered mentation and was sent to the ER where her sodium was noted to be 120 (sodium was 132 at discharge). In review of outpatient records pulmonary medicine had recently resumed low-dose Lasix on 12/15 due to shortness of breath and increasing lower extremity edema. ABG in the ER revealed hypercarbia and hypoxemia so BiPAP was initiated. Of note patient recently started chemotherapy for her metastatic adenocarcinoma with oral medications. In addition to the above findings patient's urine was highly abnormal and concerning for urinary tract infection. Her urine specific gravity was also low at 1.008. A digoxin level was elevated at 3.2. Since admission she has been diuresed with every 12 hours Lasix with slow improvement in her sodium although sodium has not returned to baseline.  Subjective / 24 H Interval events - reading the newspaper this morning, no complaints  Assessment/Plan: Active Problems:   Primary hypercoagulable state   Lung cancer, main bronchus   Bilateral lower extremity edema   Acute and chronic respiratory failure with hypoxia   Benign essential HTN   Acute encephalopathy   Hyponatremia   Chronic atrial fibrillation   Abnormal urinalysis   Digoxin toxicity   Acute on chronic respiratory failure with hypoxia   Moderate to severe pulmonary hypertension   Essential hypertension, benign   Other specified hypothyroidism   Pulmonary hypertension, moderate to severe   Spontaneous hemorrhage   UTI (urinary tract infection),  uncomplicated   Acute encephalopathy - Suspect was related to hyponatremia - resolved, now back to baseline  Hyponatremia - Suspect overall volume overload with a degree of mild intravascular depletion - a component of SIADH given her low osmolality in the urine and serum, as well as the fact that she has a lung mass - change Lasix to oral today - patient endorses drinking water "all night long", when asked if more then 8 cups per day she states "definitely" - place on water restriction  Acute on chronic respiratory failure with hypoxia - Secondary to volume overload in setting of pulmonary hypertension and TR and progressive lower extremity edema - continue supportive care  Bilateral lower extremity edema - Progressive over several weeks - albumin low at 2.8 - preserved LV function on echocardiogram in November but patient does have severe pulmonary hypertension with moderate to severe TR (therefore pre-load dependent); surprisingly RV systolic function normal - edema improved, po Lasix starting tomorrow  Severe pulmonary hypertension - See above  Abnormal blood cx's/micrococcus - 1/2 positive for micrococcus - suspect contaminant but will cont Rocephin for now (currently being utilized for abnormal urinalysis consistent with UTI); last day of Rocephin should be 07/01/2014  Digoxin toxicity w/ bradycardia  - Digoxin discontinued - repeat level 1.1 s/p digifab - given renal dysfunction acutely (with mild CKD 2) will cont to hold - bradycardia has resolved - QTc stable at 335 ms - resume digoxin at lower dose, she was on 0.25, resume 0.125  Recent spontaneous hemorrhage  - Anticoagulation discontinued after last admission in setting of significant rectus sheath hematoma with hemorrhagic shock  Chronic atrial fibrillation - Currently rate controlled with intermittent bradycardia - had  been down into the 40-50 range but now in the 70s to 80s - dig toxicity as above - cont low dose  Coreg - resume digoxin at lower dose  UTI - UA convincing for UTI, but culture not sucessful in identifying pathogen - complete 5 days of empiric abx tx  Lung cancer, main bronchus - Followed by Dr. Earlie Server - no acute issues r/t this dx   Benign essential HTN - Blood pressure controlled  Hypothyroidism - Continue Synthroid - TSH normal   Diet: DIET SOFT Fluids: none DVT Prophylaxis: SCDs   Code Status: Full Code Family Communication: d/w patient  Disposition Plan: SNF when ready    Consultants:  None   Procedures:  None    Antibiotics Ceftriaxone 12/27 >>   Studies  No results found.   Objective  Filed Vitals:   07/01/14 0629 07/01/14 1400 07/01/14 2201 07/02/14 0527  BP: 121/82 111/68 105/68 111/79  Pulse: 68 79 60 73  Temp: 98 F (36.7 C) 97.4 F (36.3 C)  97.8 F (36.6 C)  TempSrc: Oral Oral  Oral  Resp: 18 19 16 13   Height:      Weight: 84.7 kg (186 lb 11.7 oz)     SpO2: 100% 100% 100% 100%    Intake/Output Summary (Last 24 hours) at 07/02/14 1229 Last data filed at 07/02/14 0900  Gross per 24 hour  Intake    720 ml  Output   1200 ml  Net   -480 ml   Filed Weights   06/30/14 0454 06/30/14 0933 07/01/14 0629  Weight: 85.3 kg (188 lb 0.8 oz) 85.3 kg (188 lb 0.8 oz) 84.7 kg (186 lb 11.7 oz)    Exam:  General:  No apparent distress, frail Caucasian female  HEENT: pupils reactive to light, no scleral icterus   Cardiovascular: RRR, 1-2 + LE edema  Respiratory: CTA biL  Abdomen: soft, non tender  MSK/Extremities: no clubbing  Skin: no rashes  Neuro: non focal   Data Reviewed: Basic Metabolic Panel:  Recent Labs Lab 06/28/14 0359 06/29/14 0340 06/30/14 0333 07/01/14 0634 07/02/14 0342  NA 121* 124* 124* 126* 124*  K 4.5 4.2 4.0 3.7 3.6  CL 74* 75* 74* 74* 72*  CO2 39* 41* 41* 43* 43*  GLUCOSE 67* 94 101* 81 89  BUN 29* 28* 24* 22 22  CREATININE 0.95 0.96 0.90 0.85 0.82  CALCIUM 8.5 8.5 8.3* 8.4 8.3*   Liver  Function Tests:  Recent Labs Lab 06/26/14 2352 06/27/14 0623 06/28/14 0359 07/02/14 0342  AST 31 29 31 28   ALT 17 16 10 13   ALKPHOS 116 109 101 112  BILITOT 1.0 1.2 0.8 1.0  PROT 6.1 5.9* 5.3* 5.7*  ALBUMIN 3.0* 2.8* 2.8* 2.9*    Recent Labs Lab 06/27/14 0623  AMMONIA 38*   CBC:  Recent Labs Lab 06/26/14 2352 06/27/14 0623 07/02/14 0342  WBC 9.9 10.4 4.5  NEUTROABS 7.7 8.0*  --   HGB 10.3* 10.1* 9.7*  HCT 32.6* 31.6* 29.8*  MCV 99.7 99.1 103.8*  PLT 188 171 90*   Cardiac Enzymes:  Recent Labs Lab 06/26/14 2352  TROPONINI 0.05*   CBG:  Recent Labs Lab 06/29/14 1616 06/29/14 1939 06/30/14 06/30/14 0453 06/30/14 0749  GLUCAP 149* 151* 131* 103* 88    Recent Results (from the past 240 hour(s))  Culture, blood (routine x 2)     Status: None (Preliminary result)   Collection Time: 06/27/14  1:24 AM  Result Value Ref Range Status  Specimen Description BLOOD RIGHT ANTECUBITAL  Final   Special Requests BOTTLES DRAWN AEROBIC AND ANAEROBIC 10CC EA  Final   Culture   Final           BLOOD CULTURE RECEIVED NO GROWTH TO DATE CULTURE WILL BE HELD FOR 5 DAYS BEFORE ISSUING A FINAL NEGATIVE REPORT Performed at [facility]    Report Status PENDING  Incomplete  Culture, blood (routine x 2)     Status: None   Collection Time: 06/27/14  1:39 AM  Result Value Ref Range Status   Specimen Description BLOOD RIGHT HAND  Final   Special Requests BOTTLES DRAWN AEROBIC AND ANAEROBIC 5CC EA  Final   Culture   Final    MICROCOCCUS SPECIES Note: Standardized susceptibility testing for this organism is not available. Note: Gram Stain Report Called to,Read Back By and Verified With: Rico Sheehan BY INGRAM A 06/29/14 1040AM Performed at [facility]    Report Status 06/30/2014 FINAL  Final  Urine culture     Status: None   Collection Time: 06/27/14  8:13 AM  Result Value Ref Range Status   Specimen Description URINE, CATHETERIZED  Final   Special  Requests NONE  Final   Colony Count NO GROWTH Performed at [facility]   Final   Culture NO GROWTH Performed at [facility]   Final   Report Status 06/28/2014 FINAL  Final  MRSA PCR Screening     Status: None   Collection Time: 06/27/14  6:58 PM  Result Value Ref Range Status   MRSA by PCR NEGATIVE NEGATIVE Final    Comment:        The GeneXpert MRSA Assay (FDA approved for NASAL specimens only), is one component of a comprehensive MRSA colonization surveillance program. It is not intended to diagnose MRSA infection nor to guide or monitor treatment for MRSA infections.      Scheduled Meds: . carvedilol  3.125 mg Oral BID WC  . cefTRIAXone (ROCEPHIN)  IV  1 g Intravenous Q24H  . folic acid  1 mg Oral Daily  . [START ON 07/03/2014] furosemide  40 mg Oral Daily  . levothyroxine  88 mcg Oral QAC breakfast  . senna-docusate  1 tablet Oral BID  . sodium chloride  3 mL Intravenous Q12H   Continuous Infusions:   Marzetta Board, MD [facility] Pager [phone]. If 7 PM - 7 AM, please contact night-coverage at [email or web address], password [facility] 07/02/2014, 12:29 PM  LOS: 6 days

## 2014-07-03 LAB — BASIC METABOLIC PANEL
Anion gap: 11 (ref 5–15)
BUN: 21 mg/dL (ref 6–23)
CHLORIDE: 73 meq/L — AB (ref 96–112)
CO2: 40 mmol/L (ref 19–32)
CREATININE: 0.83 mg/dL (ref 0.50–1.10)
Calcium: 8.4 mg/dL (ref 8.4–10.5)
GFR calc Af Amer: 76 mL/min — ABNORMAL LOW (ref >90)
GFR calc non Af Amer: 65 mL/min — ABNORMAL LOW (ref >90)
GLUCOSE: 92 mg/dL (ref 70–99)
POTASSIUM: 4.1 mmol/L (ref 3.5–5.1)
Sodium: 124 mmol/L — ABNORMAL LOW (ref 135–145)

## 2014-07-03 LAB — CBC
HCT: 31.6 % — ABNORMAL LOW (ref 36.0–46.0)
HEMOGLOBIN: 10 g/dL — AB (ref 12.0–15.0)
MCH: 32.2 pg (ref 26.0–34.0)
MCHC: 31.6 g/dL (ref 30.0–36.0)
MCV: 101.6 fL — ABNORMAL HIGH (ref 78.0–100.0)
PLATELETS: 87 10*3/uL — AB (ref 150–400)
RBC: 3.11 MIL/uL — ABNORMAL LOW (ref 3.87–5.11)
RDW: 21.4 % — AB (ref 11.5–15.5)
WBC: 6.1 10*3/uL (ref 4.0–10.5)

## 2014-07-03 LAB — CULTURE, BLOOD (ROUTINE X 2): Culture: NO GROWTH

## 2014-07-03 MED ORDER — SODIUM CHLORIDE 0.9 % IV BOLUS (SEPSIS)
250.0000 mL | Freq: Once | INTRAVENOUS | Status: AC
Start: 2014-07-03 — End: 2014-07-03
  Administered 2014-07-03: 250 mL via INTRAVENOUS

## 2014-07-03 MED ORDER — SIMETHICONE 80 MG PO CHEW
80.0000 mg | CHEWABLE_TABLET | Freq: Four times a day (QID) | ORAL | Status: DC | PRN
  Administered 2014-07-03 – 2014-07-04 (×4): 80 mg via ORAL
  Filled 2014-07-03 (×6): qty 1

## 2014-07-03 MED ORDER — GEFITINIB 250 MG PO TABS
250.0000 mg | ORAL_TABLET | Freq: Every day | ORAL | Status: DC
  Administered 2014-07-03 – 2014-07-07 (×5): 250 mg via ORAL
  Filled 2014-07-03 (×4): qty 1

## 2014-07-03 NOTE — Progress Notes (Signed)
CRITICAL VALUE ALERT  Critical value received:  CO2: 40  Date of notification:  07/03/14  Time of notification:  1303  Critical value read back: Yes   Nurse who received alert:  Sheppard Evens, RN   MD notified (1st page):  C. Gherghe   Time of first page: 65  MD notified (2nd page):  Time of second page:  Responding MD: N/A  Time MD responded:

## 2014-07-03 NOTE — Progress Notes (Addendum)
PROGRESS NOTE  Brittney Thomas ZTI:[id number] DOB: 02/05/1933 DOA: 06/26/2014 PCP: [name], MD  HPI: 81 year old WF PMHx metastatic adenocarcinoma of the lung and a recent admission for hemorrhagic shock due to rectus sheath hematoma in setting of anticoagulation (at time of discharge anticoagulation, ACE inhibitors and Lasix were discontinued because of acute kidney injury and recent relative hypotension) who was found to have altered mentation and was sent to the ER where her sodium was noted to be 120 (sodium was 132 at discharge). In review of outpatient records pulmonary medicine had recently resumed low-dose Lasix on 12/15 due to shortness of breath and increasing lower extremity edema. ABG in the ER revealed hypercarbia and hypoxemia so BiPAP was initiated. Of note patient recently started chemotherapy for her metastatic adenocarcinoma with oral medications. In addition to the above findings patient's urine was highly abnormal and concerning for urinary tract infection. Her urine specific gravity was also low at 1.008. A digoxin level was elevated at 3.2. Since admission she has been diuresed with every 12 hours Lasix with slow improvement in her sodium although sodium has not returned to baseline.  Subjective / 24 H Interval events - no complaints this morning, denies chest pain or abdominal pain  Assessment/Plan: Active Problems:   Primary hypercoagulable state   Lung cancer, main bronchus   Bilateral lower extremity edema   Acute and chronic respiratory failure with hypoxia   Benign essential HTN   Acute encephalopathy   Hyponatremia   Chronic atrial fibrillation   Abnormal urinalysis   Digoxin toxicity   Acute on chronic respiratory failure with hypoxia   Moderate to severe pulmonary hypertension   Essential hypertension, benign   Other specified hypothyroidism   Pulmonary hypertension, moderate to severe   Spontaneous hemorrhage   UTI (urinary tract infection),  uncomplicated   Acute encephalopathy - Suspect was related to hyponatremia - resolved, now back to baseline  Hyponatremia - Suspect overall volume overload with a degree of mild intravascular depletion - a component of SIADH given her low osmolality in the urine and serum, as well as the fact that she has a lung mass - change Lasix to oral today - patient endorses drinking water "all night long", when asked if more then 8 cups per day she states "definitely" - place on water restriction - she appears that she is third spacing but in fact may be now intravascularly depleted. Will provide small bolus of normal saline today and hold her Lasix for now. Her sodium continues to be low however is stable.   Acute on chronic respiratory failure with hypoxia - Secondary to volume overload in setting of pulmonary hypertension and TR and progressive lower extremity edema - continue supportive care - careful with fluids overall  Bilateral lower extremity edema - Progressive over several weeks - albumin low at 2.8 - preserved LV function on echocardiogram in November but patient does have severe pulmonary hypertension with moderate to severe TR (therefore pre-load dependent); surprisingly RV systolic function normal - edema improved now, may be on the dry side  Severe pulmonary hypertension - See above  Abnormal blood cx's/micrococcus - 1/2 positive for micrococcus - suspect contaminant but will cont Rocephin for now (currently being utilized for abnormal urinalysis consistent with UTI); last day of Rocephin should be 07/01/2014  Digoxin toxicity w/ bradycardia  - Digoxin discontinued - repeat level 1.1 s/p digifab - given renal dysfunction acutely (with mild CKD 2) will cont to hold - bradycardia has resolved -  QTc stable at 335 ms - resume digoxin at lower dose, she was on 0.25, resume 0.125  Recent spontaneous hemorrhage  - Anticoagulation discontinued after last admission in setting of  significant rectus sheath hematoma with hemorrhagic shock  Chronic atrial fibrillation - Currently rate controlled with intermittent bradycardia - had been down into the 40-50 range but now in the 70s to 80s - dig toxicity as above - cont low dose Coreg - resumed digoxin at lower dose  UTI - UA convincing for UTI, but culture not sucessful in identifying pathogen - complete 5 days of empiric abx tx  Lung cancer, main bronchus - Followed by Dr. Earlie Server - no acute issues r/t this dx   Benign essential HTN - Blood pressure controlled  Hypothyroidism - Continue Synthroid - TSH normal   Diet: DIET SOFT Fluids: none DVT Prophylaxis: SCDs   Code Status: Full Code Family Communication: d/w patient  Disposition Plan: SNF when ready    Consultants:  None   Procedures:  None    Antibiotics Ceftriaxone 12/27 >>   Studies  No results found.   Objective  Filed Vitals:   07/03/14 0613 07/03/14 1008 07/03/14 1008 07/03/14 1354  BP: 124/67  117/66 110/71  Pulse: 84 68  50  Temp: 97.6 F (36.4 C)   98 F (36.7 C)  TempSrc: Oral   Oral  Resp: 16   16  Height:      Weight:      SpO2: 99%  93% 99%    Intake/Output Summary (Last 24 hours) at 07/03/14 1401 Last data filed at 07/02/14 2245  Gross per 24 hour  Intake    310 ml  Output    100 ml  Net    210 ml   Filed Weights   06/30/14 0454 06/30/14 0933 07/01/14 0629  Weight: 85.3 kg (188 lb 0.8 oz) 85.3 kg (188 lb 0.8 oz) 84.7 kg (186 lb 11.7 oz)   Exam:  General:  No apparent distress, frail Caucasian female  HEENT: pupils reactive to light, no scleral icterus   Cardiovascular: RRR, 1-2 + LE edema  Respiratory: CTA biL  Abdomen: soft, non tender  MSK/Extremities: no clubbing  Skin: no rashes  Neuro: non focal   Data Reviewed: Basic Metabolic Panel:  Recent Labs Lab 06/29/14 0340 06/30/14 0333 07/01/14 0634 07/02/14 0342 07/03/14 1135  NA 124* 124* 126* 124* 124*  K 4.2 4.0 3.7 3.6 4.1   CL 75* 74* 74* 72* 73*  CO2 41* 41* 43* 43* 40*  GLUCOSE 94 101* 81 89 92  BUN 28* 24* 22 22 21   CREATININE 0.96 0.90 0.85 0.82 0.83  CALCIUM 8.5 8.3* 8.4 8.3* 8.4   Liver Function Tests:  Recent Labs Lab 06/26/14 2352 06/27/14 0623 06/28/14 0359 07/02/14 0342  AST 31 29 31 28   ALT 17 16 10 13   ALKPHOS 116 109 101 112  BILITOT 1.0 1.2 0.8 1.0  PROT 6.1 5.9* 5.3* 5.7*  ALBUMIN 3.0* 2.8* 2.8* 2.9*    Recent Labs Lab 06/27/14 0623  AMMONIA 38*   CBC:  Recent Labs Lab 06/26/14 2352 06/27/14 0623 07/02/14 0342 07/03/14 1135  WBC 9.9 10.4 4.5 6.1  NEUTROABS 7.7 8.0*  --   --   HGB 10.3* 10.1* 9.7* 10.0*  HCT 32.6* 31.6* 29.8* 31.6*  MCV 99.7 99.1 103.8* 101.6*  PLT 188 171 90* 87*   Cardiac Enzymes:  Recent Labs Lab 06/26/14 2352  TROPONINI 0.05*   CBG:  Recent Labs Lab  06/29/14 1616 06/29/14 1939 06/30/14 06/30/14 0453 06/30/14 0749  GLUCAP 149* 151* 131* 103* 88    Recent Results (from the past 240 hour(s))  Culture, blood (routine x 2)     Status: None (Preliminary result)   Collection Time: 06/27/14  1:24 AM  Result Value Ref Range Status   Specimen Description BLOOD RIGHT ANTECUBITAL  Final   Special Requests BOTTLES DRAWN AEROBIC AND ANAEROBIC 10CC EA  Final   Culture   Final           BLOOD CULTURE RECEIVED NO GROWTH TO DATE CULTURE WILL BE HELD FOR 5 DAYS BEFORE ISSUING A FINAL NEGATIVE REPORT Performed at [facility]    Report Status PENDING  Incomplete  Culture, blood (routine x 2)     Status: None   Collection Time: 06/27/14  1:39 AM  Result Value Ref Range Status   Specimen Description BLOOD RIGHT HAND  Final   Special Requests BOTTLES DRAWN AEROBIC AND ANAEROBIC 5CC EA  Final   Culture   Final    MICROCOCCUS SPECIES Note: Standardized susceptibility testing for this organism is not available. Note: Gram Stain Report Called to,Read Back By and Verified With: Rico Sheehan BY INGRAM A 06/29/14 1040AM Performed at  [facility]    Report Status 06/30/2014 FINAL  Final  Urine culture     Status: None   Collection Time: 06/27/14  8:13 AM  Result Value Ref Range Status   Specimen Description URINE, CATHETERIZED  Final   Special Requests NONE  Final   Colony Count NO GROWTH Performed at [facility]   Final   Culture NO GROWTH Performed at [facility]   Final   Report Status 06/28/2014 FINAL  Final  MRSA PCR Screening     Status: None   Collection Time: 06/27/14  6:58 PM  Result Value Ref Range Status   MRSA by PCR NEGATIVE NEGATIVE Final    Comment:        The GeneXpert MRSA Assay (FDA approved for NASAL specimens only), is one component of a comprehensive MRSA colonization surveillance program. It is not intended to diagnose MRSA infection nor to guide or monitor treatment for MRSA infections.      Scheduled Meds: . carvedilol  3.125 mg Oral BID WC  . cefTRIAXone (ROCEPHIN)  IV  1 g Intravenous Q24H  . digoxin  0.125 mg Oral Daily  . folic acid  1 mg Oral Daily  . furosemide  40 mg Oral Daily  . levothyroxine  88 mcg Oral QAC breakfast  . senna-docusate  1 tablet Oral BID  . sodium chloride  250 mL Intravenous Once  . sodium chloride  3 mL Intravenous Q12H   Continuous Infusions:   Marzetta Board, MD [facility] Pager [phone]. If 7 PM - 7 AM, please contact night-coverage at [email or web address], password [facility] 07/03/2014, 2:01 PM  LOS: 7 days

## 2014-07-04 LAB — BASIC METABOLIC PANEL
Anion gap: 3 — ABNORMAL LOW (ref 5–15)
BUN: 20 mg/dL (ref 6–23)
CALCIUM: 8.3 mg/dL — AB (ref 8.4–10.5)
CHLORIDE: 73 meq/L — AB (ref 96–112)
CO2: 47 mmol/L — AB (ref 19–32)
Creatinine, Ser: 0.81 mg/dL (ref 0.50–1.10)
GFR calc Af Amer: 78 mL/min — ABNORMAL LOW (ref >90)
GFR calc non Af Amer: 67 mL/min — ABNORMAL LOW (ref >90)
GLUCOSE: 85 mg/dL (ref 70–99)
Potassium: 4.3 mmol/L (ref 3.5–5.1)
SODIUM: 123 mmol/L — AB (ref 135–145)

## 2014-07-04 MED ORDER — FUROSEMIDE 40 MG PO TABS
40.0000 mg | ORAL_TABLET | Freq: Every day | ORAL | Status: DC
  Administered 2014-07-04 – 2014-07-06 (×3): 40 mg via ORAL
  Filled 2014-07-04 (×4): qty 1

## 2014-07-04 NOTE — Consult Note (Addendum)
Reason for Consult: Hyponatremia Referring Physician: Terance Ice M.D. [facility])  HPI:  81 year old Caucasian woman with past medical history significant for metastatic adenocarcinoma of the lung who was admitted with lethargy and confusion and associated hyponatremia. About a month prior to this, she was admitted to [facility] hospital with hematologic shock secondary to rectus sheath hematoma following which her anticoagulant therapy was discontinued and Lasix was put on hold. On follow-up with pulmonology about 8 days after discharge from the hospital, furosemide was restarted due to recurrence of pedal edema. She endorses some dizziness and difficulty with balance prior to admission as well.  I was consulted earlier for assistance with management of hyponatremia that has not responded as expected to efforts at fluid restriction/diuretic therapy. She denies any current nausea, vomiting or diarrhea. She has COPD but denies any current shortness of breath-remains on supplemental option via nasal cannula. When I talked to the patient, she appears to have a delay in responding to questions but seems to be appropriate. She is oriented to place and person-vaguely to time. She is on oral gefitinib (Iressa) for lung cancer.  Past Medical History  Diagnosis Date  . Ulcer 06-08-13    Left medial ankle  . Hypertension   . Thyroid disease     Hypo-Thyroidism  . Esophageal reflux   . COPD (chronic obstructive pulmonary disease)   . Peripheral vascular disease   . Hypothyroidism   . Insomnia     takes lorazepam  . Arthritis   . Dysrhythmia   . DVT (deep venous thrombosis) 2008  . Paroxysmal a-fib 2008  . PE (pulmonary thromboembolism) 2008  . Wears dentures     Past Surgical History  Procedure Laterality Date  . Tonsillectomy    . Eye surgery Right Aug. 2014    Cataract  . Eye surgery Left Oct. 2014    Cataract  . Abdominal hysterectomy  1984    Total  . Video bronchoscopy with  endobronchial navigation N/A 04/20/2014    Procedure: VIDEO BRONCHOSCOPY WITH ENDOBRONCHIAL NAVIGATION;  Surgeon: Collene Gobble, MD;  Location: [place];  Service: Thoracic;  Laterality: N/A;  . Video bronchoscopy with endobronchial ultrasound N/A 04/20/2014    Procedure: VIDEO BRONCHOSCOPY WITH ENDOBRONCHIAL ULTRASOUND;  Surgeon: Collene Gobble, MD;  Location: [facility] OR;  Service: Thoracic;  Laterality: N/A;    Family History  Problem Relation Age of Onset  . Heart disease Maternal Grandmother   . Cancer Brother     kidney  . Stroke Brother     Social History:  reports that she has never smoked. She has never used smokeless tobacco. She reports that she does not drink alcohol or use illicit drugs.  Allergies:  Allergies  Allergen Reactions  . Poison Ivy Extract [Extract Of Poison Ivy] Rash  . Sulfa Antibiotics Rash  . Sulfacetamide Sodium Rash    Medications:  Scheduled: . carvedilol  3.125 mg Oral BID WC  . cefTRIAXone (ROCEPHIN)  IV  1 g Intravenous Q24H  . digoxin  0.125 mg Oral Daily  . folic acid  1 mg Oral Daily  . gefitinib  250 mg Oral Daily  . levothyroxine  88 mcg Oral QAC breakfast  . senna-docusate  1 tablet Oral BID  . sodium chloride  3 mL Intravenous Q12H    Results for orders placed or performed during the hospital encounter of 06/26/14 (from the past 48 hour(s))  Basic metabolic panel     Status: Abnormal   Collection Time: 07/03/14  11:35 AM  Result Value Ref Range   Sodium 124 (L) 135 - 145 mmol/L    Comment: Please note change in reference range.   Potassium 4.1 3.5 - 5.1 mmol/L    Comment: Please note change in reference range.   Chloride 73 (L) 96 - 112 mEq/L   CO2 40 (HH) 19 - 32 mmol/L    Comment: REPEATED TO VERIFY CRITICAL RESULT CALLED TO, READ BACK BY AND VERIFIED WITH: LUCAS,M RN 07/03/14 1304 WOOTEN,K    Glucose, Bld 92 70 - 99 mg/dL   BUN 21 6 - 23 mg/dL   Creatinine, Ser 0.83 0.50 - 1.10 mg/dL   Calcium 8.4 8.4 - 10.5 mg/dL   GFR calc non  Af Amer 65 (L) >90 mL/min   GFR calc Af Amer 76 (L) >90 mL/min    Comment: (NOTE) The eGFR has been calculated using the CKD EPI equation. This calculation has not been validated in all clinical situations. eGFR's persistently <90 mL/min signify possible Chronic Kidney Disease.    Anion gap 11 5 - 15  CBC     Status: Abnormal   Collection Time: 07/03/14 11:35 AM  Result Value Ref Range   WBC 6.1 4.0 - 10.5 K/uL   RBC 3.11 (L) 3.87 - 5.11 MIL/uL   Hemoglobin 10.0 (L) 12.0 - 15.0 g/dL   HCT 31.6 (L) 36.0 - 46.0 %   MCV 101.6 (H) 78.0 - 100.0 fL   MCH 32.2 26.0 - 34.0 pg   MCHC 31.6 30.0 - 36.0 g/dL   RDW 21.4 (H) 11.5 - 15.5 %   Platelets 87 (L) 150 - 400 K/uL    Comment: CONSISTENT WITH PREVIOUS RESULT  Basic metabolic panel     Status: Abnormal   Collection Time: 07/04/14  6:48 AM  Result Value Ref Range   Sodium 123 (L) 135 - 145 mmol/L    Comment: Please note change in reference range.   Potassium 4.3 3.5 - 5.1 mmol/L    Comment: Please note change in reference range.   Chloride 73 (L) 96 - 112 mEq/L   CO2 47 (HH) 19 - 32 mmol/L    Comment: REPEATED TO VERIFY CRITICAL RESULT CALLED TO, READ BACK BY AND VERIFIED WITH: RACHEL L'ESPERANCE,RN AT 0756 07/04/14 BY K BARR    Glucose, Bld 85 70 - 99 mg/dL   BUN 20 6 - 23 mg/dL   Creatinine, Ser 0.81 0.50 - 1.10 mg/dL   Calcium 8.3 (L) 8.4 - 10.5 mg/dL   GFR calc non Af Amer 67 (L) >90 mL/min   GFR calc Af Amer 78 (L) >90 mL/min    Comment: (NOTE) The eGFR has been calculated using the CKD EPI equation. This calculation has not been validated in all clinical situations. eGFR's persistently <90 mL/min signify possible Chronic Kidney Disease.    Anion gap 3 (L) 5 - 15    No results found.  Review of Systems  Constitutional: Positive for malaise/fatigue. Negative for fever and chills.  Eyes: Negative.   Respiratory: Positive for shortness of breath and wheezing.   Cardiovascular: Negative.   Gastrointestinal: Negative.    Genitourinary: Negative.   Musculoskeletal: Positive for joint pain and falls.       Left knee---from fall 4 months ago  Skin: Negative.   Neurological: Positive for weakness.       See history of present illness-recent falls  Endo/Heme/Allergies: Negative.    Blood pressure 125/69, pulse 64, temperature 98.3 F (36.8 C), temperature source  Oral, resp. rate 16, height 5' 7"  (1.702 m), weight 86.2 kg (190 lb 0.6 oz), SpO2 97 %. Physical Exam  Nursing note and vitals reviewed. Constitutional: She appears well-developed and well-nourished.  HENT:  Head: Normocephalic and atraumatic.  Mouth/Throat: Oropharynx is clear and moist. No oropharyngeal exudate.  Eyes: Conjunctivae are normal. Pupils are equal, round, and reactive to light. No scleral icterus.  Neck: Normal range of motion. Neck supple. No JVD present.  Cardiovascular: Normal rate, regular rhythm and normal heart sounds.   No murmur heard. Respiratory: Effort normal. She has wheezes. She has no rales.  Distant breath sounds bilaterally. Intermittent expiratory wheezes  GI: Soft. Bowel sounds are normal. She exhibits no distension. There is no tenderness. There is no rebound.  Musculoskeletal: Normal range of motion. She exhibits edema.  1-2+ lower extremity edema  Neurological: She is alert.  Oriented to place and person- vaguely to time  Skin: Skin is warm and dry. No rash noted. No erythema.  Psychiatric: She has a normal mood and affect.    Assessment/Plan: 1. Hyponatremia: On exam, this appears to be hypervolemic hyponatremia. Her low serum osmolality indicates that she has true hyponatremia while her inappropriately elevated urine osmolality indicates that she has inappropriate ADH elevation. It is possible that she has SIADH from adenocarcinoma-non-small cell lung cancer. Given the constellation of findings, it is also likely that her volume excess is contributing to hyponatremia. She is hypoalbuminemic/malnourished that  further worsens hyponatremia. I recommend furosemide 40 mg daily together with strict fluid restriction of 1200 mL per 24 hours. No indications at this time for hypertonic saline or aquaretic 2. Metastatic lung cancer: Ongoing oral chemotherapy with Iressa-management per oncology. 3. Anemia: Likely associated with lung cancer, continue to monitor at this time-of indications for ESA therapy. 4. Malnutrition: With protein calorie malnutrition likely associated with metastatic cancer-supplementation as required.   Hagar Sadiq K. 07/04/2014, 1:40 PM

## 2014-07-04 NOTE — Progress Notes (Signed)
CRITICAL VALUE ALERT  Critical value received:  C02 47  Date of notification:  07/04/2014  Time of notification:  7:56 AM  Critical value read back:Yes.    Nurse who received alert:  Girtha Rm  MD notified (1st page):  Dr. Cruzita Lederer   Time of first page:  7:57 AM  MD notified (2nd page):  Time of second page:  Responding MD:  Dr. Cruzita Lederer  Time MD responded:  7:57 AM  No new orders received at this time

## 2014-07-04 NOTE — Progress Notes (Signed)
PROGRESS NOTE  Brittney Thomas WER:[id number] DOB: Jun 02, 1933 DOA: 06/26/2014 PCP: [name], MD  HPI: 81 year old WF PMHx metastatic adenocarcinoma of the lung and a recent admission for hemorrhagic shock due to rectus sheath hematoma in setting of anticoagulation (at time of discharge anticoagulation, ACE inhibitors and Lasix were discontinued because of acute kidney injury and recent relative hypotension) who was found to have altered mentation and was sent to the ER where her sodium was noted to be 120 (sodium was 132 at discharge). In review of outpatient records pulmonary medicine had recently resumed low-dose Lasix on 12/15 due to shortness of breath and increasing lower extremity edema. ABG in the ER revealed hypercarbia and hypoxemia so BiPAP was initiated. Of note patient recently started chemotherapy for her metastatic adenocarcinoma with oral medications. In addition to the above findings patient's urine was highly abnormal and concerning for urinary tract infection. Her urine specific gravity was also low at 1.008. A digoxin level was elevated at 3.2. Since admission she has been diuresed with every 12 hours Lasix with slow improvement in her sodium although sodium has not returned to baseline.  Subjective / 24 H Interval events - no complaints this morning, denies chest pain or abdominal pain  Assessment/Plan: Active Problems:   Primary hypercoagulable state   Lung cancer, main bronchus   Bilateral lower extremity edema   Acute and chronic respiratory failure with hypoxia   Benign essential HTN   Acute encephalopathy   Hyponatremia   Chronic atrial fibrillation   Abnormal urinalysis   Digoxin toxicity   Acute on chronic respiratory failure with hypoxia   Moderate to severe pulmonary hypertension   Essential hypertension, benign   Other specified hypothyroidism   Pulmonary hypertension, moderate to severe   Spontaneous hemorrhage   UTI (urinary tract infection),  uncomplicated  Acute encephalopathy - Suspect was related to hyponatremiavs UTI - resolved, now back to baseline however still hyponatremic  Hyponatremia - Suspect overall volume overload with a degree of mild intravascular depletion - a component of SIADH  - I have consulted Nephrology today, appreciate input.   Acute on chronic respiratory failure with hypoxia - Secondary to volume overload in setting of pulmonary hypertension and TR and progressive lower extremity edema - continue supportive care - careful with fluids overall  Bilateral lower extremity edema - Progressive over several weeks - albumin low at 2.8 - preserved LV function on echocardiogram in November but patient does have severe pulmonary hypertension with moderate to severe TR (therefore pre-load dependent); surprisingly RV systolic function normal  Severe pulmonary hypertension - See above  Abnormal blood cx's/micrococcus - 1/2 positive for micrococcus - suspect contaminant but will cont Rocephin for now (currently being utilized for abnormal urinalysis consistent with UTI); last day of Rocephin should be 07/01/2014  Digoxin toxicity w/ bradycardia  - Digoxin discontinued - repeat level 1.1 s/p digifab - given renal dysfunction acutely (with mild CKD 2) will cont to hold - bradycardia has resolved - QTc stable at 335 ms - resume digoxin at lower dose, she was on 0.25, resume 0.125  Recent spontaneous hemorrhage  - Anticoagulation discontinued after last admission in setting of significant rectus sheath hematoma with hemorrhagic shock  Chronic atrial fibrillation - Currently rate controlled with intermittent bradycardia - had been down into the 40-50 range but now in the 70s to 80s - dig toxicity as above - cont low dose Coreg - resumed digoxin at lower dose  UTI - UA convincing for UTI, but  culture not sucessful in identifying pathogen - completed empiric abx tx  Lung cancer, main bronchus - Followed by Dr.  Earlie Server - no acute issues r/t this dx  - continue oral chemotherapy  Benign essential HTN - Blood pressure controlled  Hypothyroidism - Continue Synthroid - TSH normal  Diet: DIET SOFT Fluids: none DVT Prophylaxis: SCDs  Code Status: Full Code Family Communication: d/w patient  Disposition Plan: SNF when ready   Consultants:  None   Procedures:  None    Antibiotics Ceftriaxone 12/27 >>   Studies  No results found.  Objective  Filed Vitals:   07/04/14 0842 07/04/14 0844 07/04/14 1013 07/04/14 1403  BP: 125/69   113/70  Pulse:  63 64 53  Temp:    97.5 F (36.4 C)  TempSrc:    Oral  Resp:    16  Height:      Weight:      SpO2:    98%    Intake/Output Summary (Last 24 hours) at 07/04/14 1501 Last data filed at 07/04/14 1000  Gross per 24 hour  Intake    720 ml  Output    875 ml  Net   -155 ml   Filed Weights   06/30/14 0933 07/01/14 0629 07/04/14 0522  Weight: 85.3 kg (188 lb 0.8 oz) 84.7 kg (186 lb 11.7 oz) 86.2 kg (190 lb 0.6 oz)   Exam:  General:  No apparent distress, frail Caucasian female  HEENT: pupils reactive to light, no scleral icterus   Cardiovascular: RRR, 1-2 + LE edema  Respiratory: CTA biL  Abdomen: soft, non tender  MSK/Extremities: no clubbing  Skin: no rashes  Neuro: non focal   Data Reviewed: Basic Metabolic Panel:  Recent Labs Lab 06/30/14 0333 07/01/14 0634 07/02/14 0342 07/03/14 1135 07/04/14 0648  NA 124* 126* 124* 124* 123*  K 4.0 3.7 3.6 4.1 4.3  CL 74* 74* 72* 73* 73*  CO2 41* 43* 43* 40* 47*  GLUCOSE 101* 81 89 92 85  BUN 24* 22 22 21 20   CREATININE 0.90 0.85 0.82 0.83 0.81  CALCIUM 8.3* 8.4 8.3* 8.4 8.3*   Liver Function Tests:  Recent Labs Lab 06/28/14 0359 07/02/14 0342  AST 31 28  ALT 10 13  ALKPHOS 101 112  BILITOT 0.8 1.0  PROT 5.3* 5.7*  ALBUMIN 2.8* 2.9*   CBC:  Recent Labs Lab 07/02/14 0342 07/03/14 1135  WBC 4.5 6.1  HGB 9.7* 10.0*  HCT 29.8* 31.6*  MCV 103.8*  101.6*  PLT 90* 87*   CBG:  Recent Labs Lab 06/29/14 1616 06/29/14 1939 06/30/14 06/30/14 0453 06/30/14 0749  GLUCAP 149* 151* 131* 103* 88    Recent Results (from the past 240 hour(s))  Culture, blood (routine x 2)     Status: None   Collection Time: 06/27/14  1:24 AM  Result Value Ref Range Status   Specimen Description BLOOD RIGHT ANTECUBITAL  Final   Special Requests BOTTLES DRAWN AEROBIC AND ANAEROBIC 10CC EA  Final   Culture   Final    NO GROWTH 5 DAYS Performed at [facility]    Report Status 07/03/2014 FINAL  Final  Culture, blood (routine x 2)     Status: None   Collection Time: 06/27/14  1:39 AM  Result Value Ref Range Status   Specimen Description BLOOD RIGHT HAND  Final   Special Requests BOTTLES DRAWN AEROBIC AND ANAEROBIC 5CC EA  Final   Culture   Final    MICROCOCCUS  SPECIES Note: Standardized susceptibility testing for this organism is not available. Note: Gram Stain Report Called to,Read Back By and Verified With: Rico Sheehan BY INGRAM A 06/29/14 1040AM Performed at [facility]    Report Status 06/30/2014 FINAL  Final  Urine culture     Status: None   Collection Time: 06/27/14  8:13 AM  Result Value Ref Range Status   Specimen Description URINE, CATHETERIZED  Final   Special Requests NONE  Final   Colony Count NO GROWTH Performed at [facility]   Final   Culture NO GROWTH Performed at [facility]   Final   Report Status 06/28/2014 FINAL  Final  MRSA PCR Screening     Status: None   Collection Time: 06/27/14  6:58 PM  Result Value Ref Range Status   MRSA by PCR NEGATIVE NEGATIVE Final    Comment:        The GeneXpert MRSA Assay (FDA approved for NASAL specimens only), is one component of a comprehensive MRSA colonization surveillance program. It is not intended to diagnose MRSA infection nor to guide or monitor treatment for MRSA infections.      Scheduled Meds: . carvedilol  3.125 mg Oral BID  WC  . cefTRIAXone (ROCEPHIN)  IV  1 g Intravenous Q24H  . digoxin  0.125 mg Oral Daily  . folic acid  1 mg Oral Daily  . furosemide  40 mg Oral Daily  . gefitinib  250 mg Oral Daily  . levothyroxine  88 mcg Oral QAC breakfast  . senna-docusate  1 tablet Oral BID  . sodium chloride  3 mL Intravenous Q12H   Continuous Infusions:   Marzetta Board, MD [facility] Pager [phone]. If 7 PM - 7 AM, please contact night-coverage at [email or web address], password [facility] 07/04/2014, 3:01 PM  LOS: 8 days

## 2014-07-05 LAB — BASIC METABOLIC PANEL
Anion gap: 8 (ref 5–15)
BUN: 19 mg/dL (ref 6–23)
CO2: 45 mmol/L (ref 19–32)
CREATININE: 0.8 mg/dL (ref 0.50–1.10)
Calcium: 8.5 mg/dL (ref 8.4–10.5)
Chloride: 72 mEq/L — ABNORMAL LOW (ref 96–112)
GFR calc non Af Amer: 68 mL/min — ABNORMAL LOW (ref >90)
GFR, EST AFRICAN AMERICAN: 79 mL/min — AB (ref >90)
GLUCOSE: 87 mg/dL (ref 70–99)
POTASSIUM: 4.4 mmol/L (ref 3.5–5.1)
Sodium: 125 mmol/L — ABNORMAL LOW (ref 135–145)

## 2014-07-05 NOTE — Progress Notes (Signed)
Physical Therapy Treatment Patient Details Name: Brittney Thomas MRN: [id number] DOB: 22-Dec-1932 Today's Date: 07/05/2014    History of Present Illness 81 yo CA pt with onset of hemorrhagic hematoma in rectus abd, now with UTI, acute encephalopathy and hyponatremia was readmitted from SNF.  PMHx:  lung CA on chemo, PVD, PE and PAF.    PT Comments    Pt stated she had been looking forward to getting up in the chair, stating she had only been up once since being admitted to the hospital. Pt with decreased endurance which limits her mobility. Pt min-mod assist for transfer to standing and once standing is steady with use of RW. Pt on 2L O2 at all times. Pt had episode of urinary incontinence upon standing and was cleaned and dried prior to transitioning to chair. Pt will continue to benefit from acute PT to focus on improved mobility, endurance and safety.    Follow Up Recommendations  SNF;Supervision/Assistance - 24 hour     Equipment Recommendations  None recommended by PT    Recommendations for Other Services OT consult     Precautions / Restrictions Precautions Precautions: Fall Restrictions Weight Bearing Restrictions: No    Mobility  Bed Mobility Overal bed mobility: Needs Assistance Bed Mobility: Rolling;Sidelying to Sit Rolling: Mod assist Sidelying to sit: HOB elevated;Min assist       General bed mobility comments: Pt able to slowly walk leg to side of bed with help by PT only to reduce friction. Pt requiring in-mod assistance and use of pad to roll unto side. Pt able min assist to raise up from sidelying to sitting EOB with HOB elevated.    Transfers Overall transfer level: Needs assistance Equipment used: Rolling walker (2 wheeled) Transfers: Sit to/from Stand Sit to Stand: Min assist;Mod assist         General transfer comment: Pt attempted stand from lowered bed, requiring mod assist to transfer. Pt with good standing balance with support of RW upon standing.  Pt stood a second time from [facility] with min assist. Pt requiring initial cues for safe hand placement.   Ambulation/Gait Ambulation/Gait assistance: Min assist Ambulation Distance (Feet): 4 Feet Assistive device: Rolling walker (2 wheeled) Gait Pattern/deviations: Step-to pattern;Decreased step length - right;Decreased step length - left;Shuffle;Wide base of support   Gait velocity interpretation: Below normal speed for age/gender General Gait Details: Pt demonstrated low endurance with ambulation but was stable with support of RW and had no loss of balance. Pt able to ambulate approximately 2 feet before needing to sit on BSC. Pt then able to back up 2 feet to her recliner chair.    Stairs            Wheelchair Mobility    Modified Rankin (Stroke Patients Only)       Balance Overall balance assessment: Needs assistance Sitting-balance support: Feet supported;Single extremity supported Sitting balance-Leahy Scale: Poor Sitting balance - Comments: Pt sat for approximately 5 minutes EOB and required heavy use of her RUE to hold herself up to prevent falling posteriorly. Pt complained of some SOB during this time but her SPO2 was between 92-94%.   Standing balance support: During functional activity;Bilateral upper extremity supported Standing balance-Leahy Scale: Poor Standing balance comment: Pt requires use of RW for standing.                     Cognition Arousal/Alertness: Awake/alert Behavior During Therapy: WFL for tasks assessed/performed Overall Cognitive Status: Within Functional Limits for tasks  assessed                      Exercises      General Comments General comments (skin integrity, edema, etc.): Upon standing, pt had episode of urinary incontinence. Pt stated this has been normal for her because of her Lasix. PT got pt on BSC, washed and cleaned pts lower body and changed her socks. Pt's gown was dry and did not require changing.         Pertinent Vitals/Pain Pain Assessment: No/denies pain    Home Living                      Prior Function            PT Goals (current goals can now be found in the care plan section) Progress towards PT goals: Progressing toward goals    Frequency  Min 2X/week    PT Plan Current plan remains appropriate    Co-evaluation             End of Session Equipment Utilized During Treatment: Oxygen Activity Tolerance: Patient tolerated treatment well;Patient limited by fatigue Patient left: in chair;with call bell/phone within reach     Time: 8416-6063 PT Time Calculation (min) (ACUTE ONLY): 28 min  Charges:  $Therapeutic Activity: 23-37 mins                    G Codes:      Usbaldo Pannone, Tessie Fass SPT 07/05/2014, 3:33 PM  Jearld Shines, [place]  Acute [facility] [phone] I

## 2014-07-05 NOTE — Progress Notes (Signed)
PROGRESS NOTE  Brittney Thomas LZJ:[id number] DOB: 04-Dec-1932 DOA: 06/26/2014 PCP: [name], MD  HPI: 81 year old WF PMHx metastatic adenocarcinoma of the lung and a recent admission for hemorrhagic shock due to rectus sheath hematoma in setting of anticoagulation (at time of discharge anticoagulation, ACE inhibitors and Lasix were discontinued because of acute kidney injury and recent relative hypotension) who was found to have altered mentation and was sent to the ER where her sodium was noted to be 120 (sodium was 132 at discharge). In review of outpatient records pulmonary medicine had recently resumed low-dose Lasix on 12/15 due to shortness of breath and increasing lower extremity edema. ABG in the ER revealed hypercarbia and hypoxemia so BiPAP was initiated. Of note patient recently started chemotherapy for her metastatic adenocarcinoma with oral medications. In addition to the above findings patient's urine was highly abnormal and concerning for urinary tract infection. Her urine specific gravity was also low at 1.008. A digoxin level was elevated at 3.2. Since admission she has been diuresed with every 12 hours Lasix with slow improvement in her sodium although sodium has not returned to baseline.  Subjective / 24 H Interval events - without complaints, feeling very weak   Assessment/Plan: Active Problems:   Primary hypercoagulable state   Lung cancer, main bronchus   Bilateral lower extremity edema   Acute and chronic respiratory failure with hypoxia   Benign essential HTN   Acute encephalopathy   Hyponatremia   Chronic atrial fibrillation   Abnormal urinalysis   Digoxin toxicity   Acute on chronic respiratory failure with hypoxia   Moderate to severe pulmonary hypertension   Essential hypertension, benign   Other specified hypothyroidism   Pulmonary hypertension, moderate to severe   Spontaneous hemorrhage   UTI (urinary tract infection), uncomplicated  Acute  encephalopathy - Suspect was related to hyponatremiavs UTI - resolved, now back to baseline however still hyponatremic  Hyponatremia - Suspect overall volume overload with a degree of mild intravascular depletion - a component of SIADH  - nephrology consulted on 1/4, appreciate input. Continue fluid restriction and Lasix, Na improved a bit today - can probably d/c to SNF once sodium >130  Acute on chronic respiratory failure with hypoxia - Secondary to volume overload in setting of pulmonary hypertension and TR and progressive lower extremity edema - continue supportive care - careful with fluids overall - stable  Bilateral lower extremity edema - Progressive over several weeks - albumin low at 2.8 - preserved LV function on echocardiogram in November but patient does have severe pulmonary hypertension with moderate to severe TR (therefore pre-load dependent); surprisingly RV systolic function normal - improved, stable  Severe pulmonary hypertension - See above  Abnormal blood cx's/micrococcus - 1/2 positive for micrococcus - suspect contaminant, s/p Rocephin  Digoxin toxicity w/ bradycardia  - Digoxin discontinued - repeat level 1.1 s/p digifab - given renal dysfunction acutely (with mild CKD 2) will cont to hold - bradycardia has resolved - QTc stable at 335 ms - resumed digoxin at lower dose, she was on 0.25, resume 0.125  Recent spontaneous hemorrhage  - Anticoagulation discontinued after last admission in setting of significant rectus sheath hematoma with hemorrhagic shock  Chronic atrial fibrillation - Currently rate controlled with intermittent bradycardia - had been down into the 40-50 range but now in the 70s to 80s - dig toxicity as above - cont low dose Coreg - resumed digoxin at lower dose  UTI - UA convincing for UTI, but culture not  sucessful in identifying pathogen - completed empiric abx tx  Lung cancer, main bronchus - Followed by Dr. Earlie Server - no acute  issues r/t this dx  - continue oral chemotherapy  Benign essential HTN - Blood pressure controlled  Hypothyroidism - Continue Synthroid - TSH normal  Diet: DIET SOFT Fluids: none DVT Prophylaxis: SCDs  Code Status: Full Code Family Communication: d/w patient  Disposition Plan: SNF when ready   Consultants:  None   Procedures:  None    Antibiotics Ceftriaxone 12/27 >>   Studies  No results found.  Objective  Filed Vitals:   07/04/14 1644 07/04/14 2123 07/05/14 0327 07/05/14 0502  BP:  116/87  128/68  Pulse: 60 53  65  Temp:  97.9 F (36.6 C)  97.9 F (36.6 C)  TempSrc:  Oral  Oral  Resp:  16  16  Height:      Weight:   85 kg (187 lb 6.3 oz)   SpO2:  98%  95%    Intake/Output Summary (Last 24 hours) at 07/05/14 0850 Last data filed at 07/05/14 0326  Gross per 24 hour  Intake    480 ml  Output   1300 ml  Net   -820 ml   Filed Weights   07/01/14 0629 07/04/14 0522 07/05/14 0327  Weight: 84.7 kg (186 lb 11.7 oz) 86.2 kg (190 lb 0.6 oz) 85 kg (187 lb 6.3 oz)   Exam:  General:  No apparent distress, frail Caucasian female  HEENT: pupils reactive to light, no scleral icterus   Cardiovascular: RRR, 1-2 + LE edema  Respiratory: CTA biL  Abdomen: soft, non tender  MSK/Extremities: no clubbing  Skin: no rashes  Neuro: non focal   Data Reviewed: Basic Metabolic Panel:  Recent Labs Lab 07/01/14 0634 07/02/14 0342 07/03/14 1135 07/04/14 0648 07/05/14 0309  NA 126* 124* 124* 123* 125*  K 3.7 3.6 4.1 4.3 4.4  CL 74* 72* 73* 73* 72*  CO2 43* 43* 40* 47* 45*  GLUCOSE 81 89 92 85 87  BUN 22 22 21 20 19   CREATININE 0.85 0.82 0.83 0.81 0.80  CALCIUM 8.4 8.3* 8.4 8.3* 8.5   Liver Function Tests:  Recent Labs Lab 07/02/14 0342  AST 28  ALT 13  ALKPHOS 112  BILITOT 1.0  PROT 5.7*  ALBUMIN 2.9*   CBC:  Recent Labs Lab 07/02/14 0342 07/03/14 1135  WBC 4.5 6.1  HGB 9.7* 10.0*  HCT 29.8* 31.6*  MCV 103.8* 101.6*  PLT 90* 87*     CBG:  Recent Labs Lab 06/29/14 1616 06/29/14 1939 06/30/14 06/30/14 0453 06/30/14 0749  GLUCAP 149* 151* 131* 103* 88    Recent Results (from the past 240 hour(s))  Culture, blood (routine x 2)     Status: None   Collection Time: 06/27/14  1:24 AM  Result Value Ref Range Status   Specimen Description BLOOD RIGHT ANTECUBITAL  Final   Special Requests BOTTLES DRAWN AEROBIC AND ANAEROBIC 10CC EA  Final   Culture   Final    NO GROWTH 5 DAYS Performed at [facility]    Report Status 07/03/2014 FINAL  Final  Culture, blood (routine x 2)     Status: None   Collection Time: 06/27/14  1:39 AM  Result Value Ref Range Status   Specimen Description BLOOD RIGHT HAND  Final   Special Requests BOTTLES DRAWN AEROBIC AND ANAEROBIC 5CC EA  Final   Culture   Final    MICROCOCCUS SPECIES Note:  Standardized susceptibility testing for this organism is not available. Note: Gram Stain Report Called to,Read Back By and Verified With: Rico Sheehan BY INGRAM A 06/29/14 1040AM Performed at [facility]    Report Status 06/30/2014 FINAL  Final  Urine culture     Status: None   Collection Time: 06/27/14  8:13 AM  Result Value Ref Range Status   Specimen Description URINE, CATHETERIZED  Final   Special Requests NONE  Final   Colony Count NO GROWTH Performed at [facility]   Final   Culture NO GROWTH Performed at [facility]   Final   Report Status 06/28/2014 FINAL  Final  MRSA PCR Screening     Status: None   Collection Time: 06/27/14  6:58 PM  Result Value Ref Range Status   MRSA by PCR NEGATIVE NEGATIVE Final    Comment:        The GeneXpert MRSA Assay (FDA approved for NASAL specimens only), is one component of a comprehensive MRSA colonization surveillance program. It is not intended to diagnose MRSA infection nor to guide or monitor treatment for MRSA infections.      Scheduled Meds: . carvedilol  3.125 mg Oral BID WC  . digoxin   0.125 mg Oral Daily  . folic acid  1 mg Oral Daily  . furosemide  40 mg Oral Daily  . gefitinib  250 mg Oral Daily  . levothyroxine  88 mcg Oral QAC breakfast  . senna-docusate  1 tablet Oral BID  . sodium chloride  3 mL Intravenous Q12H   Continuous Infusions:   Marzetta Board, MD [facility] Pager [phone]. If 7 PM - 7 AM, please contact night-coverage at [email or web address], password [facility] 07/05/2014, 8:50 AM  LOS: 9 days

## 2014-07-05 NOTE — Progress Notes (Signed)
Patient ID: Brittney Thomas, female   DOB: Nov 13, 1932, 81 y.o.   MRN: [id number]  [facility] Progress Note    Assessment/ Plan:   1. 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 (Re)Started on furosemide yesterday together with ongoing fluid restriction of 1.2 L/24 hours. Reeducated patient regarding the need for ongoing fluid restriction as well as the impact of her underlying malignancy on current sodium levels. No indications at this time for hypertonic saline/aquaretic. 2. Metastatic lung cancer: Ongoing oral chemotherapy with Iressa-management per oncology. 3. Anemia: Likely associated with lung cancer, continue to monitor at this time-of indications for ESA therapy. 4. Malnutrition: With protein calorie malnutrition likely associated with metastatic cancer-supplementation as required.  Subjective:   Reports to be feeling well-denies any emerging/focal complaints    Objective:   BP 113/79 mmHg  Pulse 65  Temp(Src) 97.9 F (36.6 C) (Oral)  Resp 16  Ht 5\' 7"  (1.702 m)  Wt 85 kg (187 lb 6.3 oz)  BMI 29.34 kg/m2  SpO2 98%  Intake/Output Summary (Last 24 hours) at 07/05/14 1223 Last data filed at 07/05/14 1020  Gross per 24 hour  Intake    462 ml  Output   1300 ml  Net   -838 ml   Weight change: -1.2 kg (-2 lb 10.3 oz)  Physical Exam: Gen: Comfortably resting in bed CVS: Pulse regular rate and rhythm, S1 and S2 normal Resp: Clear to auscultation bilaterally, no rales Abd: Soft, obese, nontender Ext: Trace to 1+ lower extremity edema  Imaging: No results found.  Labs: BMET  Recent Labs Lab 06/29/14 0340 06/30/14 0333 07/01/14 0634 07/02/14 0342 07/03/14 1135 07/04/14 0648 07/05/14 0309  NA 124* 124* 126* 124* 124* 123* 125*  K 4.2 4.0 3.7 3.6 4.1 4.3 4.4  CL 75* 74* 74* 72* 73* 73* 72*  CO2 41* 41* 43* 43* 40* 47* 45*  GLUCOSE 94 101* 81 89 92 85 87  BUN 28* 24* 22 22 21 20 19   CREATININE 0.96  0.90 0.85 0.82 0.83 0.81 0.80  CALCIUM 8.5 8.3* 8.4 8.3* 8.4 8.3* 8.5   CBC  Recent Labs Lab 07/02/14 0342 07/03/14 1135  WBC 4.5 6.1  HGB 9.7* 10.0*  HCT 29.8* 31.6*  MCV 103.8* 101.6*  PLT 90* 87*    Medications:    . carvedilol  3.125 mg Oral BID WC  . digoxin  0.125 mg Oral Daily  . folic acid  1 mg Oral Daily  . furosemide  40 mg Oral Daily  . gefitinib  250 mg Oral Daily  . levothyroxine  88 mcg Oral QAC breakfast  . senna-docusate  1 tablet Oral BID  . sodium chloride  3 mL Intravenous Q12H   Elmarie Shiley, MD 07/05/2014, 12:23 PM

## 2014-07-05 NOTE — Clinical Social Work Note (Signed)
Patient has bed at [facility]. CSW continuing to follow.   Liz Beach MSW, [place], [place], [phone]

## 2014-07-06 LAB — BASIC METABOLIC PANEL
ANION GAP: 8 (ref 5–15)
BUN: 22 mg/dL (ref 6–23)
CO2: 44 mmol/L — AB (ref 19–32)
Calcium: 8.5 mg/dL (ref 8.4–10.5)
Chloride: 75 mEq/L — ABNORMAL LOW (ref 96–112)
Creatinine, Ser: 0.85 mg/dL (ref 0.50–1.10)
GFR calc Af Amer: 74 mL/min — ABNORMAL LOW (ref >90)
GFR calc non Af Amer: 63 mL/min — ABNORMAL LOW (ref >90)
Glucose, Bld: 78 mg/dL (ref 70–99)
Potassium: 4.5 mmol/L (ref 3.5–5.1)
SODIUM: 127 mmol/L — AB (ref 135–145)

## 2014-07-06 MED ORDER — METOPROLOL TARTRATE 12.5 MG HALF TABLET
12.5000 mg | ORAL_TABLET | Freq: Two times a day (BID) | ORAL | Status: DC
  Administered 2014-07-06 – 2014-07-07 (×2): 12.5 mg via ORAL
  Filled 2014-07-06 (×3): qty 1

## 2014-07-06 NOTE — Progress Notes (Signed)
Patient ID: Brittney Thomas, female   DOB: Jan 03, 1933, 81 y.o.   MRN: [id number]  [facility] Progress Note    Assessment/ Plan:   1. 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 (Re)Started on furosemide yesterday together with ongoing fluid restriction of 1.2 L/24 hours. Sodium levels rising slowly- continue current Rx---may be suitable for DC within 24hrs 2. Metastatic lung cancer: Ongoing oral chemotherapy with Iressa-management per oncology. 3. Anemia: Likely associated with lung cancer, continue to monitor at this time-of indications for ESA therapy. 4. Malnutrition: With protein calorie malnutrition likely associated with metastatic cancer-supplementation as required.  Subjective:   Reports to be feeling fair and needs more time "so that daughter can get her settled at rehab".   Objective:   BP 135/77 mmHg  Pulse 77  Temp(Src) 97.4 F (36.3 C) (Oral)  Resp 16  Ht 5\' 7"  (1.702 m)  Wt 85.9 kg (189 lb 6 oz)  BMI 29.65 kg/m2  SpO2 96%  Intake/Output Summary (Last 24 hours) at 07/06/14 1124 Last data filed at 07/06/14 1017  Gross per 24 hour  Intake    340 ml  Output    300 ml  Net     40 ml   Weight change: 0.9 kg (1 lb 15.7 oz)  Physical Exam: MGN:[id number] resting in recliner QXI:HWTUU RRR Resp:CTA bilaterally, no rales EKC:MKLK,JZPHX, NT Ext:1+ LE edema  Imaging: No results found.  Labs: BMET  Recent Labs Lab 06/30/14 0333 07/01/14 0634 07/02/14 0342 07/03/14 1135 07/04/14 0648 07/05/14 0309 07/06/14 0649  NA 124* 126* 124* 124* 123* 125* 127*  K 4.0 3.7 3.6 4.1 4.3 4.4 4.5  CL 74* 74* 72* 73* 73* 72* 75*  CO2 41* 43* 43* 40* 47* 45* 44*  GLUCOSE 101* 81 89 92 85 87 78  BUN 24* 22 22 21 20 19 22   CREATININE 0.90 0.85 0.82 0.83 0.81 0.80 0.85  CALCIUM 8.3* 8.4 8.3* 8.4 8.3* 8.5 8.5   CBC  Recent Labs Lab 07/02/14 0342 07/03/14 1135  WBC 4.5 6.1  HGB 9.7* 10.0*  HCT 29.8* 31.6*   MCV 103.8* 101.6*  PLT 90* 87*    Medications:    . carvedilol  3.125 mg Oral BID WC  . digoxin  0.125 mg Oral Daily  . folic acid  1 mg Oral Daily  . furosemide  40 mg Oral Daily  . gefitinib  250 mg Oral Daily  . levothyroxine  88 mcg Oral QAC breakfast  . senna-docusate  1 tablet Oral BID  . sodium chloride  3 mL Intravenous Q12H   Elmarie Shiley, MD 07/06/2014, 11:24 AM

## 2014-07-06 NOTE — Progress Notes (Signed)
PROGRESS NOTE  Brittney Thomas INO:[id number] DOB: 1933-04-06 DOA: 06/26/2014 PCP: [name], MD  HPI/Recap of past 24 hours: Feeling better, aaox3, denies pain, reported baseline sob.  Assessment/Plan: Active Problems:   Primary hypercoagulable state   Lung cancer, main bronchus   Bilateral lower extremity edema   Acute and chronic respiratory failure with hypoxia   Benign essential HTN   Acute encephalopathy   Hyponatremia   Chronic atrial fibrillation   Abnormal urinalysis   Digoxin toxicity   Acute on chronic respiratory failure with hypoxia   Moderate to severe pulmonary hypertension   Essential hypertension, benign   Other specified hypothyroidism   Pulmonary hypertension, moderate to severe   Spontaneous hemorrhage   UTI (urinary tract infection), uncomplicated  Acute encephalopathy - Suspect was related to hyponatremiavs UTI - resolved, now back to baseline however still hyponatremic  Hyponatremia - Suspect overall volume overload with a degree of mild intravascular depletion - a component of SIADH  - Nephrology following, improving, on fluids restriction/lasix, repeat bmp in am.  Acute on chronic respiratory failure with hypoxia - Secondary to volume overload in setting of pulmonary hypertension and TR and progressive lower extremity edema - continue supportive care - reported not on home oxygen, wean oxygen  Bilateral lower extremity edema - Progressive over several weeks - albumin low at 2.8 - preserved LV function on echocardiogram in November but patient does have severe pulmonary hypertension with moderate to severe TR (therefore pre-load dependent); surprisingly RV systolic function normal --overall less edema, but edema L>R, venous US ordered today to r/o DVT.  Severe pulmonary hypertension - See above  Abnormal blood cx's/micrococcus - 1/2 positive for micrococcus - suspect contaminant but will cont Rocephin for now (currently being utilized  for abnormal urinalysis consistent with UTI); last day of Rocephin should be 07/01/2014 --finish abx treatment. Wbc wnl, no fever.  Digoxin toxicity w/ bradycardia  - Digoxin discontinued - repeat level 1.1 s/p digifab - given renal dysfunction acutely (with mild CKD 2) will cont to hold - bradycardia has resolved - QTc stable at 335 ms - resume digoxin at lower dose, she was on 0.25, resume 0.125 --d/c dig. Change coreg to low dose metoprolol for rate control due to bp low normal. --not on anticoagulation due to recent spontaneous hemorrhage with hemorrhagic shock  Recent spontaneous hemorrhage  - Anticoagulation discontinued after last admission in setting of significant rectus sheath hematoma with hemorrhagic shock  Chronic atrial fibrillation - Currently rate controlled with intermittent bradycardia - had been down into the 40-50 range but now in the 70s to 80s - dig toxicity as above - cont low dose Coreg - resumed digoxin at lower dose  UTI - UA convincing for UTI, but culture not sucessful in identifying pathogen - completed empiric abx tx  Lung cancer, main bronchus - Followed by Dr. Earlie Server - no acute issues r/t this dx  - continue oral chemotherapy  Benign essential HTN - Blood pressure controlled  Hypothyroidism - Continue Synthroid - TSH normal  Code Status: full  Family Communication: not today  Disposition Plan: snf tomorrow   Consultants:  nephrology  Procedures:  none  Antibiotics:  Last dose of rocephin 1/3  Objective: BP 120/58 mmHg  Pulse 121  Temp(Src) 98 F (36.7 C) (Oral)  Resp 20  Ht 5\' 7"  (1.702 m)  Wt 85.9 kg (189 lb 6 oz)  BMI 29.65 kg/m2  SpO2 98%  Intake/Output Summary (Last 24 hours) at 07/06/14 1637 Last data  filed at 07/06/14 1421  Gross per 24 hour  Intake    460 ml  Output    550 ml  Net    -90 ml   Filed Weights   07/04/14 0522 07/05/14 0327 07/06/14 0615  Weight: 86.2 kg (190 lb 0.6 oz) 85 kg (187 lb 6.3 oz) 85.9  kg (189 lb 6 oz)    Exam:   General:  Frail, chronically ills apprearing  Cardiovascular: chronic afib  Respiratory: poor respiratory effort  Abdomen: soft, NT/ND, positive bs.  Musculoskeletal: left lower extremity edema, chronic per patient   Data Reviewed: Basic Metabolic Panel:  Recent Labs Lab 07/02/14 0342 07/03/14 1135 07/04/14 0648 07/05/14 0309 07/06/14 0649  NA 124* 124* 123* 125* 127*  K 3.6 4.1 4.3 4.4 4.5  CL 72* 73* 73* 72* 75*  CO2 43* 40* 47* 45* 44*  GLUCOSE 89 92 85 87 78  BUN 22 21 20 19 22   CREATININE 0.82 0.83 0.81 0.80 0.85  CALCIUM 8.3* 8.4 8.3* 8.5 8.5   Liver Function Tests:  Recent Labs Lab 07/02/14 0342  AST 28  ALT 13  ALKPHOS 112  BILITOT 1.0  PROT 5.7*  ALBUMIN 2.9*   No results for input(s): LIPASE, AMYLASE in the last 168 hours. No results for input(s): AMMONIA in the last 168 hours. CBC:  Recent Labs Lab 07/02/14 0342 07/03/14 1135  WBC 4.5 6.1  HGB 9.7* 10.0*  HCT 29.8* 31.6*  MCV 103.8* 101.6*  PLT 90* 87*   Cardiac Enzymes:   No results for input(s): CKTOTAL, CKMB, CKMBINDEX, TROPONINI in the last 168 hours. BNP (last 3 results) No results for input(s): PROBNP in the last 8760 hours. CBG:  Recent Labs Lab 06/29/14 1939 06/30/14 06/30/14 0453 06/30/14 0749  GLUCAP 151* 131* 103* 88    Recent Results (from the past 240 hour(s))  Culture, blood (routine x 2)     Status: None   Collection Time: 06/27/14  1:24 AM  Result Value Ref Range Status   Specimen Description BLOOD RIGHT ANTECUBITAL  Final   Special Requests BOTTLES DRAWN AEROBIC AND ANAEROBIC 10CC EA  Final   Culture   Final    NO GROWTH 5 DAYS Performed at [facility]    Report Status 07/03/2014 FINAL  Final  Culture, blood (routine x 2)     Status: None   Collection Time: 06/27/14  1:39 AM  Result Value Ref Range Status   Specimen Description BLOOD RIGHT HAND  Final   Special Requests BOTTLES DRAWN AEROBIC AND ANAEROBIC 5CC EA   Final   Culture   Final    MICROCOCCUS SPECIES Note: Standardized susceptibility testing for this organism is not available. Note: Gram Stain Report Called to,Read Back By and Verified With: Rico Sheehan BY INGRAM A 06/29/14 1040AM Performed at [facility]    Report Status 06/30/2014 FINAL  Final  Urine culture     Status: None   Collection Time: 06/27/14  8:13 AM  Result Value Ref Range Status   Specimen Description URINE, CATHETERIZED  Final   Special Requests NONE  Final   Colony Count NO GROWTH Performed at [facility]   Final   Culture NO GROWTH Performed at [facility]   Final   Report Status 06/28/2014 FINAL  Final  MRSA PCR Screening     Status: None   Collection Time: 06/27/14  6:58 PM  Result Value Ref Range Status   MRSA by PCR NEGATIVE NEGATIVE Final  Comment:        The GeneXpert MRSA Assay (FDA approved for NASAL specimens only), is one component of a comprehensive MRSA colonization surveillance program. It is not intended to diagnose MRSA infection nor to guide or monitor treatment for MRSA infections.      Studies: No results found.  Scheduled Meds: . carvedilol  3.125 mg Oral BID WC  . digoxin  0.125 mg Oral Daily  . folic acid  1 mg Oral Daily  . furosemide  40 mg Oral Daily  . gefitinib  250 mg Oral Daily  . levothyroxine  88 mcg Oral QAC breakfast  . senna-docusate  1 tablet Oral BID  . sodium chloride  3 mL Intravenous Q12H    Continuous Infusions: none     Damita Eppard  [facility] Pager [phone]. If 7PM-7AM, please contact night-coverage at [email or web address], password [facility] 07/06/2014, 4:37 PM  LOS: 10 days

## 2014-07-06 NOTE — Clinical Social Work Note (Signed)
CSW met with patient at bedside to inform her that she is likely ready for DC on 07/07/13. Patient states that she will be letting her family know this when they visit her this evening.    Liz Beach MSW, [place], [place], [phone]

## 2014-07-07 LAB — BASIC METABOLIC PANEL
ANION GAP: 8 (ref 5–15)
BUN: 23 mg/dL (ref 6–23)
CALCIUM: 8.7 mg/dL (ref 8.4–10.5)
CO2: 45 mmol/L (ref 19–32)
Chloride: 74 mEq/L — ABNORMAL LOW (ref 96–112)
Creatinine, Ser: 0.98 mg/dL (ref 0.50–1.10)
GFR, EST AFRICAN AMERICAN: 62 mL/min — AB (ref >90)
GFR, EST NON AFRICAN AMERICAN: 53 mL/min — AB (ref >90)
Glucose, Bld: 85 mg/dL (ref 70–99)
POTASSIUM: 4.6 mmol/L (ref 3.5–5.1)
SODIUM: 127 mmol/L — AB (ref 135–145)

## 2014-07-07 MED ORDER — SENNOSIDES-DOCUSATE SODIUM 8.6-50 MG PO TABS: 1.0000 | ORAL_TABLET | Freq: Two times a day (BID) | ORAL | Status: AC

## 2014-07-07 MED ORDER — FUROSEMIDE 40 MG PO TABS: 40.0000 mg | ORAL_TABLET | Freq: Two times a day (BID) | ORAL | Status: AC

## 2014-07-07 MED ORDER — METOPROLOL TARTRATE 25 MG PO TABS: 12.5000 mg | ORAL_TABLET | Freq: Two times a day (BID) | ORAL | Status: DC

## 2014-07-07 MED ORDER — FUROSEMIDE 40 MG PO TABS
40.0000 mg | ORAL_TABLET | Freq: Two times a day (BID) | ORAL | Status: DC
  Administered 2014-07-07: 40 mg via ORAL
  Filled 2014-07-07 (×3): qty 1

## 2014-07-07 NOTE — Clinical Social Work Placement (Signed)
Clinical Social Work Department CLINICAL SOCIAL WORK PLACEMENT NOTE 07/07/2014  Patient:  Brittney Thomas, Brittney Thomas  Account Number:  [id number] Admit date:  06/26/2014  Clinical Social Worker:  Domenica Reamer, CLINICAL SOCIAL WORKER  Date/time:  06/29/2014 02:34 PM  Clinical Social Work is seeking post-discharge placement for this patient at the following level of care:   SKILLED NURSING   (*CSW will update this form in Epic as items are completed)   06/29/2014  Patient/family provided with [place] Department of Clinical Social Work's list of facilities offering this level of care within the geographic area requested by the patient (or if unable, by the patient's family).  06/29/2014  Patient/family informed of their freedom to choose among providers that offer the needed level of care, that participate in Medicare, Medicaid or managed care program needed by the patient, have an available bed and are willing to accept the patient.  06/29/2014  Patient/family informed of [facility]' ownership interest in [facility], as well as of the fact that they are under no obligation to receive care at this facility.  PASARR submitted to EDS on 06/29/2014 PASARR number received on 06/29/2014  FL2 transmitted to all facilities in geographic area requested by pt/family on  06/29/2014 FL2 transmitted to all facilities within larger geographic area on   Patient informed that his/her managed care company has contracts with or will negotiate with  certain facilities, including the following:     Patient/family informed of bed offers received:   Patient chooses bed at [facility] Physician recommends and patient chooses bed at    Patient to be transferred to [facility] on  07/07/2014 Patient to be transferred to facility by Ambulance Patient and family notified of transfer on 07/07/2014 Name of family member notified:  [name]  The following physician request were entered  in Epic:   Additional Comments:  Per MD patient ready for DC to [facility] and [place], patient, patient's family (CSW was able to reach patient's son [name] who states he will continue to try and reach his sister [name] to notify of DC. CSW has made multiple calls and left multiple messages for daughter but has not heard back from her), and facility notified of DC. RN given number for report. DC packet on chart. [facility] transport requested for patient. CSW signing off.   Liz Beach MSW, [place], [place], [phone]

## 2014-07-07 NOTE — Discharge Summary (Signed)
Discharge Summary  Brittney Thomas FXT:[id number] DOB: 1932/09/17  PCP: [name], MD  Admit date: 06/26/2014 Discharge date: 07/07/2014  Time spent: >46mins  Recommendations for Outpatient Follow-up:  1. Primary care physician to monitor volume status, sodium level, adjust lasix dose. 2. Oncology for cancer care, chronic hypercoagulable state, not a candidate for anticoagulation. IVC filter? Palliative care?  Discharge Diagnoses:  Active Hospital Problems   Diagnosis Date Noted  . Pulmonary hypertension, moderate to severe   . Spontaneous hemorrhage   . UTI (urinary tract infection), uncomplicated   . Acute on chronic respiratory failure with hypoxia   . Moderate to severe pulmonary hypertension   . Essential hypertension, benign   . Other specified hypothyroidism   . Acute encephalopathy 06/27/2014  . Hyponatremia 06/27/2014  . Chronic atrial fibrillation 06/27/2014  . Abnormal urinalysis 06/27/2014  . Digoxin toxicity 06/27/2014  . Benign essential HTN 06/19/2014  . Acute and chronic respiratory failure with hypoxia 06/14/2014  . Bilateral lower extremity edema 06/14/2014  . Lung cancer, main bronchus 05/03/2014  . Primary hypercoagulable state 04/15/2014    Resolved Hospital Problems   Diagnosis Date Noted Date Resolved  . Adenocarcinoma 05/27/2014 06/27/2014    Discharge Condition: stable, improved  Diet recommendation: fluids restriction, cardiac diet  Filed Weights   07/05/14 0327 07/06/14 0615 07/07/14 0448  Weight: 85 kg (187 lb 6.3 oz) 85.9 kg (189 lb 6 oz) 84.1 kg (185 lb 6.5 oz)    History of present illness:  Brittney Thomas is a 81 y.o. female with history of metastatic adenocarcinoma of the lung who was recently admitted for hemorrhagic shock secondary to rectus sheath hematoma resulted in discontinuing anticoagulation. She was sent back to the hospital from nursing home due to altered mental status and hyponatremia with sodium level 120. She appears  fluids overload on admission. Per report, patient's home meds lasix has been on hold from previous hospitalization. She is also found to have uti on admission.  Hospital Course:  Patient is admitted to hospitalist service, treated with full course of iv abx. For hyponatremia, nephrology consulted, recommended fluids restriction/ lasix. Patient responded well with great urine output and decreasing edema, sodium level gradually improved. Patient's has chronic afib with low normal bp and bradycardia on presentation. afib meds adjusted. Digoxin stopped. Coreg stopped, lopressor 12.5 bid started for rate control. Patient is not a candidate for anticoagulation due to h/o rectal sheath bleeding with hemorrhagic shock. Patient also has h/o dvt, pe, with lung cancer, will recommend to discuss with oncology about overall prognosis, whether IVC filter is an option for this patient. Patient's  ABG shows mildly elevated PCO2 required brife BiPAP.  Patient mental status improved with above treatment. she is AAOX3 at time of discharge.  For lung cancer treatment, will defer to oncology close follow up. Patient has poor functional status, reported has not been able to walk independently for the last several months, may not be a candidate for aggressive therapy. May consider palliative care consult. Will defer decisions to oncology/pmd.    Active Problems:   Primary hypercoagulable state   Lung cancer, main bronchus   Bilateral lower extremity edema   Acute and chronic respiratory failure with hypoxia   Benign essential HTN   Acute encephalopathy   Hyponatremia   Chronic atrial fibrillation   Abnormal urinalysis   Digoxin toxicity   Acute on chronic respiratory failure with hypoxia   Moderate to severe pulmonary hypertension   Essential hypertension, benign   Other  specified hypothyroidism   Pulmonary hypertension, moderate to severe   Spontaneous hemorrhage   UTI (urinary tract infection),  uncomplicated   Procedures: none  Consultations:  nephrology  Discharge Exam: BP 114/61 mmHg  Pulse 64  Temp(Src) 97.7 F (36.5 C) (Oral)  Resp 12  Ht 5\' 7"  (1.702 m)  Wt 84.1 kg (185 lb 6.5 oz)  BMI 29.03 kg/m2  SpO2 96%  General: frail, aaox3 Cardiovascular: chronic afib Respiratory: good air entry bilaterally, No wheezing, no rales, no rhonchi Extremity: edema subsided significantly, still more edema on the left. ( known h/o superficial venous thrombosis on LLE)  Discharge Instructions You were cared for by a hospitalist during your hospital stay. If you have any questions about your discharge medications or the care you received while you were in the hospital after you are discharged, you can call the unit and asked to speak with the hospitalist on call if the hospitalist that took care of you is not available. Once you are discharged, your primary care physician will handle any further medical issues. Please note that NO REFILLS for any discharge medications will be authorized once you are discharged, as it is imperative that you return to your primary care physician (or establish a relationship with a primary care physician if you do not have one) for your aftercare needs so that they can reassess your need for medications and monitor your lab values.  Discharge Instructions    Diet - low sodium heart healthy    Complete by:  As directed      Increase activity slowly    Complete by:  As directed             Medication List    STOP taking these medications        carvedilol 3.125 MG tablet  Commonly known as:  COREG     dexamethasone 4 MG tablet  Commonly known as:  DECADRON     digoxin 0.25 MG tablet  Commonly known as:  LANOXIN     FISH OIL + D3 PO      TAKE these medications        acetaminophen 500 MG tablet  Commonly known as:  TYLENOL  Take 500 mg by mouth every 6 (six) hours as needed (pain).     calcium-vitamin D 500-200 MG-UNIT per tablet   Commonly known as:  OSCAL WITH D  Take 1 tablet by mouth daily with breakfast.     feeding supplement (ENSURE COMPLETE) Liqd  Take 237 mLs by mouth 2 (two) times daily between meals.     folic acid 1 MG tablet  Commonly known as:  FOLVITE  Take 1 tablet (1 mg total) by mouth daily.     furosemide 40 MG tablet  Commonly known as:  LASIX  Take 1 tablet (40 mg total) by mouth 2 (two) times daily.     gefitinib 250 MG tablet  Commonly known as:  IRESSA  Take 1 tablet (250 mg total) by mouth daily.     levothyroxine 88 MCG tablet  Commonly known as:  SYNTHROID, LEVOTHROID  Take 88 mcg by mouth daily before breakfast.     metoprolol tartrate 25 MG tablet  Commonly known as:  LOPRESSOR  Take 0.5 tablets (12.5 mg total) by mouth 2 (two) times daily.     SALMON OIL-1000 PO  Take 1 tablet by mouth daily.     senna-docusate 8.6-50 MG per tablet  Commonly known as:  Senokot-S  Take  1 tablet by mouth 2 (two) times daily.       Allergies  Allergen Reactions  . Poison Ivy Extract [Extract Of Poison Ivy] Rash  . Sulfa Antibiotics Rash  . Sulfacetamide Sodium Rash       Follow-up Information    Follow up with [name], MD In 1 week.   Specialty:  Internal Medicine   Contact information:   [place] [address] [phone]       Follow up with Eilleen Kempf., MD In 2 weeks.   Specialty:  Oncology   Contact information:   [address] [place] [address] [phone]        The results of significant diagnostics from this hospitalization (including imaging, microbiology, ancillary and laboratory) are listed below for reference.    Significant Diagnostic Studies: Ct Head Wo Contrast  06/27/2014   CLINICAL DATA:  Initial evaluation for acute altered mental status.  EXAM: CT HEAD WITHOUT CONTRAST  TECHNIQUE: Contiguous axial images were obtained from the base of the skull through the vertex without intravenous contrast.  COMPARISON:   Prior study from 05/30/2014.  FINDINGS: Diffuse prominence of the CSF containing spaces is compatible with generalized cerebral atrophy. Patchy and confluent hypodensity within the periventricular and deep white matter both cerebral hemispheres most consistent with chronic small vessel ischemic disease.  No acute large vessel territory infarct. No intracranial hemorrhage. No mass lesion, midline shift, or mass effect. No hydrocephalus. There is no extra-axial fluid collection.  No acute abnormality seen about the orbits. Scalp soft tissues within normal limits.  Calvarium intact.  Paranasal sinuses are clear.  No mastoid effusion.  IMPRESSION: 1. No acute intracranial process. 2. Atrophy with chronic microvascular ischemic changes.   Electronically Signed   By: Jeannine Boga M.D.   On: 06/27/2014 03:18   Dg Chest Port 1 View  06/27/2014   CLINICAL DATA:  Altered mental status  EXAM: PORTABLE CHEST - 1 VIEW  COMPARISON:  05/29/2014  FINDINGS: Haziness of the lower chest, obscuring the left more than right diaphragm.  Chronic cardiomegaly.  Stable aortic and hilar contours.  No edema noted in the upper lungs.  No pneumothorax.  IMPRESSION: Bibasilar lung opacities. Hazy appearance favors atelectasis or small effusions over pneumonia.   Electronically Signed   By: Jorje Guild M.D.   On: 06/27/2014 00:05    Microbiology: Recent Results (from the past 240 hour(s))  MRSA PCR Screening     Status: None   Collection Time: 06/27/14  6:58 PM  Result Value Ref Range Status   MRSA by PCR NEGATIVE NEGATIVE Final    Comment:        The GeneXpert MRSA Assay (FDA approved for NASAL specimens only), is one component of a comprehensive MRSA colonization surveillance program. It is not intended to diagnose MRSA infection nor to guide or monitor treatment for MRSA infections.      Labs: Basic Metabolic Panel:  Recent Labs Lab 07/03/14 1135 07/04/14 0648 07/05/14 0309 07/06/14 0649  07/07/14 0705  NA 124* 123* 125* 127* 127*  K 4.1 4.3 4.4 4.5 4.6  CL 73* 73* 72* 75* 74*  CO2 40* 47* 45* 44* 45*  GLUCOSE 92 85 87 78 85  BUN 21 20 19 22 23   CREATININE 0.83 0.81 0.80 0.85 0.98  CALCIUM 8.4 8.3* 8.5 8.5 8.7   Liver Function Tests:  Recent Labs Lab 07/02/14 0342  AST 28  ALT 13  ALKPHOS 112  BILITOT  1.0  PROT 5.7*  ALBUMIN 2.9*   No results for input(s): LIPASE, AMYLASE in the last 168 hours. No results for input(s): AMMONIA in the last 168 hours. CBC:  Recent Labs Lab 07/02/14 0342 07/03/14 1135  WBC 4.5 6.1  HGB 9.7* 10.0*  HCT 29.8* 31.6*  MCV 103.8* 101.6*  PLT 90* 87*   Cardiac Enzymes: No results for input(s): CKTOTAL, CKMB, CKMBINDEX, TROPONINI in the last 168 hours. BNP: BNP (last 3 results) No results for input(s): PROBNP in the last 8760 hours. CBG: No results for input(s): GLUCAP in the last 168 hours.     Signed:  Angad Nabers  [facility] 07/07/2014, 12:22 PM

## 2014-07-07 NOTE — Progress Notes (Signed)
Physical Therapy Treatment Patient Details Name: Brittney Thomas MRN: [id number] DOB: 23-Oct-1932 Today's Date: 07/07/2014    History of Present Illness 81 yo CA pt with onset of hemorrhagic hematoma in rectus abd, now with UTI, acute encephalopathy and hyponatremia was readmitted from SNF.  PMHx:  lung CA on chemo, PVD, PE and PAF.    PT Comments    Progressing slowly.  Tried a portion of treatment session without O2 and found pt at 75% and HR down to 40's bpm.  Took a full 2 minutes for pt to return to 90% on 2L [place].  Follow Up Recommendations  SNF;Supervision/Assistance - 24 hour     Equipment Recommendations  None recommended by PT    Recommendations for Other Services       Precautions / Restrictions Precautions Precautions: Fall Precaution Comments: needs O2 Restrictions Weight Bearing Restrictions: No    Mobility  Bed Mobility Overal bed mobility: Needs Assistance Bed Mobility: Supine to Sit     Supine to sit: Min assist     General bed mobility comments: min truncal assist, less assist of LE's  Transfers Overall transfer level: Needs assistance Equipment used: Rolling walker (2 wheeled);None Transfers: Sit to/from [employer] to Stand: Min assist;Mod assist (min with armrests) Stand pivot transfers: Min assist       General transfer comment: cues for hand placement  Ambulation/Gait Ambulation/Gait assistance: Min assist Ambulation Distance (Feet): 7 Feet Assistive device: Rolling walker (2 wheeled) Gait Pattern/deviations: Step-through pattern;Step-to pattern;Decreased step length - right;Decreased step length - left Gait velocity: slow   General Gait Details: limited by activity tolerance as much as weakness.  Pt may also be self limiting   Stairs            Wheelchair Mobility    Modified Rankin (Stroke Patients Only)       Balance Overall balance assessment: Needs assistance Sitting-balance support: Feet  supported;No upper extremity supported Sitting balance-Leahy Scale: Fair     Standing balance support: Bilateral upper extremity supported Standing balance-Leahy Scale: Poor Standing balance comment: need RW, but not assist to maintain upright stance                    Cognition Arousal/Alertness: Awake/alert Behavior During Therapy: WFL for tasks assessed/performed Overall Cognitive Status: Within Functional Limits for tasks assessed                      Exercises General Exercises - Lower Extremity Ankle Circles/Pumps: AROM;Both;20 reps;Supine Quad Sets: AROM;Strengthening;Both;10 reps;Supine Gluteal Sets: AROM;Both;10 reps;Supine Heel Slides: AROM;Strengthening;Both;10 reps;Supine (resisted) Hip ABduction/ADduction: AROM;AAROM;Both;15 reps;Supine Other Exercises Other Exercises: bicep/tricep presses x 10 reps    General Comments        Pertinent Vitals/Pain Pain Assessment: 0-10 Pain Score: 6  Pain Location: behine ?perineal area Pain Descriptors / Indicators: Discomfort Pain Intervention(s): Monitored during session (RN made aware)    Home Living                      Prior Function            PT Goals (current goals can now be found in the care plan section) Acute Rehab PT Goals Patient Stated Goal: To get to new SNF and then home PT Goal Formulation: With patient Time For Goal Achievement: 07/16/14 Potential to Achieve Goals: Good Progress towards PT goals: Progressing toward goals    Frequency  Min 2X/week    PT Plan  Current plan remains appropriate    Co-evaluation             End of Session Equipment Utilized During Treatment: Oxygen Activity Tolerance: Patient tolerated treatment well;Patient limited by fatigue Patient left: in chair;with call bell/phone within reach     Time: 1139-1218 PT Time Calculation (min) (ACUTE ONLY): 39 min  Charges:  $Therapeutic Exercise: 8-22 mins $Therapeutic Activity: 8-22  mins                    G Codes:      Camya Haydon, Tessie Fass 07/07/2014, 1:37 PM   07/07/2014  Donnella Sham, PT [phone]  (pager)

## 2014-07-07 NOTE — Progress Notes (Signed)
Patient ID: Brittney Thomas, female   DOB: 1933-06-12, 81 y.o.   MRN: [id number]  [facility] Progress Note    Assessment/ Plan:   1. 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 (Re)Started on furosemide yesterday together with ongoing fluid restriction of 1.2 L/24 hours. Sodium level unchanged since yesterday-127. She does not appear to have any mental status changes and I will increase furosemide to 40 mg twice a day she remains volume overloaded. Acceptable for discharge at this point with outpatient follow-up by PCP and nephrology referral as needed 2. Metastatic lung cancer: Ongoing oral chemotherapy with Iressa-management per oncology. 3. Anemia: Likely associated with lung cancer, continue to monitor at this time-of indications for ESA therapy. 4. Malnutrition: With protein calorie malnutrition likely associated with metastatic cancer-supplementation as required.  Subjective:   Reports to be feeling well-unclear about disposition to [facility] and rehabilitation    Objective:   BP 114/61 mmHg  Pulse 64  Temp(Src) 97.7 F (36.5 C) (Oral)  Resp 12  Ht 5\' 7"  (1.702 m)  Wt 84.1 kg (185 lb 6.5 oz)  BMI 29.03 kg/m2  SpO2 96%  Intake/Output Summary (Last 24 hours) at 07/07/14 1136 Last data filed at 07/07/14 1018  Gross per 24 hour  Intake    360 ml  Output    800 ml  Net   -440 ml   Weight change: -1.8 kg (-3 lb 15.5 oz)  Physical Exam: Gen: Comfortably resting in bed-easy to awaken CVS: Pulse regular in rate and rhythm Resp: Clear to auscultation Abd: Soft, flat, nontender Ext: 1+ lower extremity edema  Imaging: No results found.  Labs: BMET  Recent Labs Lab 07/01/14 [phone] 07/02/14 0342 07/03/14 1135 07/04/14 0648 07/05/14 0309 07/06/14 0649 07/07/14 0705  NA 126* 124* 124* 123* 125* 127* 127*  K 3.7 3.6 4.1 4.3 4.4 4.5 4.6  CL 74* 72* 73* 73* 72* 75* 74*  CO2 43* 43* 40* 47* 45* 44* 45*   GLUCOSE 81 89 92 85 87 78 85  BUN 22 22 21 20 19 22 23   CREATININE 0.85 0.82 0.83 0.81 0.80 0.85 0.98  CALCIUM 8.4 8.3* 8.4 8.3* 8.5 8.5 8.7   CBC  Recent Labs Lab 07/02/14 0342 07/03/14 1135  WBC 4.5 6.1  HGB 9.7* 10.0*  HCT 29.8* 31.6*  MCV 103.8* 101.6*  PLT 90* 87*   Medications:    . folic acid  1 mg Oral Daily  . furosemide  40 mg Oral Daily  . gefitinib  250 mg Oral Daily  . levothyroxine  88 mcg Oral QAC breakfast  . metoprolol tartrate  12.5 mg Oral BID  . senna-docusate  1 tablet Oral BID  . sodium chloride  3 mL Intravenous Q12H   Elmarie Shiley, MD 07/07/2014, 11:36 AM

## 2014-07-13 ENCOUNTER — Telehealth: Payer: Self-pay | Admitting: Physician Assistant

## 2014-07-13 ENCOUNTER — Telehealth: Payer: Self-pay | Admitting: Medical Oncology

## 2014-07-13 NOTE — Telephone Encounter (Signed)
onc tx request sent to r/s appt.

## 2014-07-13 NOTE — Telephone Encounter (Signed)
Confirm appt for Jan. Mailed cal.

## 2014-07-18 ENCOUNTER — Ambulatory Visit: Payer: Medicare Other | Admitting: Pulmonary Disease

## 2014-07-21 ENCOUNTER — Encounter (HOSPITAL_COMMUNITY): Payer: Self-pay

## 2014-07-22 ENCOUNTER — Other Ambulatory Visit: Payer: Medicare Other

## 2014-07-22 ENCOUNTER — Ambulatory Visit: Payer: Medicare Other | Admitting: Physician Assistant

## 2014-07-25 ENCOUNTER — Telehealth: Payer: Self-pay | Admitting: Internal Medicine

## 2014-07-25 NOTE — Telephone Encounter (Signed)
s.w. pt dtr and r/s appt per request...done....ok and aware of new d.t

## 2014-07-28 ENCOUNTER — Other Ambulatory Visit: Payer: Medicare Other

## 2014-07-28 ENCOUNTER — Ambulatory Visit: Payer: Medicare Other | Admitting: Physician Assistant

## 2014-08-04 ENCOUNTER — Telehealth: Payer: Self-pay | Admitting: Internal Medicine

## 2014-08-04 ENCOUNTER — Encounter: Payer: Self-pay | Admitting: Physician Assistant

## 2014-08-04 ENCOUNTER — Ambulatory Visit (HOSPITAL_BASED_OUTPATIENT_CLINIC_OR_DEPARTMENT_OTHER): Payer: Medicare Other | Admitting: Physician Assistant

## 2014-08-04 ENCOUNTER — Other Ambulatory Visit (HOSPITAL_BASED_OUTPATIENT_CLINIC_OR_DEPARTMENT_OTHER): Payer: Medicare Other

## 2014-08-04 VITALS — BP 115/75 | HR 111 | Temp 98.2°F | Resp 18 | Ht 67.0 in | Wt 180.3 lb

## 2014-08-04 DIAGNOSIS — C3412 Malignant neoplasm of upper lobe, left bronchus or lung: Secondary | ICD-10-CM

## 2014-08-04 DIAGNOSIS — C3400 Malignant neoplasm of unspecified main bronchus: Secondary | ICD-10-CM

## 2014-08-04 DIAGNOSIS — R197 Diarrhea, unspecified: Secondary | ICD-10-CM

## 2014-08-04 LAB — COMPREHENSIVE METABOLIC PANEL (CC13)
ALBUMIN: 3.1 g/dL — AB (ref 3.5–5.0)
ALT: 18 U/L (ref 0–55)
ANION GAP: 11 meq/L (ref 3–11)
AST: 31 U/L (ref 5–34)
Alkaline Phosphatase: 136 U/L (ref 40–150)
BUN: 18.5 mg/dL (ref 7.0–26.0)
CO2: 33 mEq/L — ABNORMAL HIGH (ref 22–29)
CREATININE: 0.9 mg/dL (ref 0.6–1.1)
Calcium: 8.7 mg/dL (ref 8.4–10.4)
Chloride: 96 mEq/L — ABNORMAL LOW (ref 98–109)
EGFR: 60 mL/min/{1.73_m2} — ABNORMAL LOW (ref >90)
Glucose: 102 mg/dl (ref 70–140)
Potassium: 3.8 mEq/L (ref 3.5–5.1)
SODIUM: 140 meq/L (ref 136–145)
Total Bilirubin: 1.04 mg/dL (ref 0.20–1.20)
Total Protein: 6.6 g/dL (ref 6.4–8.3)

## 2014-08-04 LAB — CBC WITH DIFFERENTIAL/PLATELET
BASO%: 1.9 % (ref 0.0–2.0)
Basophils Absolute: 0.1 10*3/uL (ref 0.0–0.1)
EOS%: 1.5 % (ref 0.0–7.0)
Eosinophils Absolute: 0.1 10*3/uL (ref 0.0–0.5)
HEMATOCRIT: 33.6 % — AB (ref 34.8–46.6)
HGB: 10.6 g/dL — ABNORMAL LOW (ref 11.6–15.9)
LYMPH#: 0.5 10*3/uL — AB (ref 0.9–3.3)
LYMPH%: 13.6 % — ABNORMAL LOW (ref 14.0–49.7)
MCH: 34.7 pg — ABNORMAL HIGH (ref 25.1–34.0)
MCHC: 31.6 g/dL (ref 31.5–36.0)
MCV: 109.9 fL — ABNORMAL HIGH (ref 79.5–101.0)
MONO#: 0.5 10*3/uL (ref 0.1–0.9)
MONO%: 14.7 % — AB (ref 0.0–14.0)
NEUT%: 68.3 % (ref 38.4–76.8)
NEUTROS ABS: 2.4 10*3/uL (ref 1.5–6.5)
Platelets: 169 10*3/uL (ref 145–400)
RBC: 3.06 10*6/uL — ABNORMAL LOW (ref 3.70–5.45)
RDW: 19.5 % — ABNORMAL HIGH (ref 11.2–14.5)
WBC: 3.6 10*3/uL — AB (ref 3.9–10.3)

## 2014-08-04 NOTE — Patient Instructions (Signed)
Continue on Iressa  250 mg by mouth daily Take Imodium as needed for diarrhea Follow-up in one month with a restaging CT scan of your chest, abdomen and pelvis to reevaluate your disease

## 2014-08-04 NOTE — Telephone Encounter (Signed)
gv and printed appt sched and avs for pt for March....gv pt barium

## 2014-08-04 NOTE — Progress Notes (Addendum)
No images are attached to the encounter. No scans are attached to the encounter. No scans are attached to the encounter. [facility] SHARED VISIT PROGRESS NOTE  [name], MD [place] [address]  DIAGNOSIS: Lung cancer, main bronchus   Staging form: Lung, AJCC 7th Edition     Clinical stage from 05/03/2014: Stage IV (T1a, N3, M1b) - [place] [facility] Molecular markers positive for [id number], [id number], [id number]*  PRIOR THERAPY: systemic chemotherapy with carboplatin for an AUC of 5 and Alimta 500 mg/m given every 3 weeks. Status post one cycle, discontinued secondary to intolerance.  CURRENT THERAPY: Iressa 250 mg by mouth daily status post 6 weeks of treatment.  INTERVAL HISTORY: Brittney Thomas 81 y.o. female returns for a scheduled regular symptom management visit for followup of her recently diagnosed stage IV (T1b, N3, M1b) non-small cell lung cancer, adenocarcinoma. She status post 1 cycle of systemic chemotherapy with carboplatin and Alimta given on 05/10/2014. She has been in and out of the hospital office since that first cycle due to recurrent edema and weakness. She was started on Iressa (gefitinib)) 250 mg by mouth daily with therapy beginning approximately at the end of December 2015. She has been on treatment for approximately 6 weeks. Unfortunately she has again been in and out of the hospital due to her cardiac status and missed a follow-up visit or 2. She presents today for symptom management visit regarding the Iressa treatment. Overall she's tolerating this therapy well with the exception of occasional episodes of diarrhea that occur about every 3-4 days. She has taken Imodium with good control. She does report some fatigue. She denied any fever, chills, cough, shortness of breath or hemoptysis. She denied nausea, vomiting, diarrhea constipation. She denied any significant weight loss or night sweats.   MEDICAL  HISTORY: Past Medical History  Diagnosis Date  . Ulcer 06-08-13    Left medial ankle  . Hypertension   . Thyroid disease     Hypo-Thyroidism  . Esophageal reflux   . COPD (chronic obstructive pulmonary disease)   . Peripheral vascular disease   . Hypothyroidism   . Insomnia     takes lorazepam  . Arthritis   . Dysrhythmia   . DVT (deep venous thrombosis) 2008  . Paroxysmal a-fib 2008  . PE (pulmonary thromboembolism) 2008  . Wears dentures     ALLERGIES:  is allergic to poison ivy extract; sulfa antibiotics; and sulfacetamide sodium.  MEDICATIONS:  Current Outpatient Prescriptions  Medication Sig Dispense Refill  . acetaminophen (TYLENOL) 500 MG tablet Take 500 mg by mouth every 6 (six) hours as needed (pain).    [name] albuterol (PROVENTIL) (5 MG/ML) 0.5% nebulizer solution Take 2.5 mg by nebulization every 6 (six) hours as needed for wheezing or shortness of breath.    . calcium-vitamin D (OSCAL WITH D) 500-200 MG-UNIT per tablet Take 1 tablet by mouth daily with breakfast.     . feeding supplement, ENSURE COMPLETE, (ENSURE COMPLETE) LIQD Take 237 mLs by mouth 2 (two) times daily between meals.    . folic acid (FOLVITE) 1 MG tablet Take 1 tablet (1 mg total) by mouth daily. 30 tablet 3  . furosemide (LASIX) 40 MG tablet Take 1 tablet (40 mg total) by mouth 2 (two) times daily. 60 tablet 0  . gefitinib (IRESSA) 250 MG tablet Take 1 tablet (250 mg total) by mouth daily. 30 tablet 2  . levothyroxine (SYNTHROID, LEVOTHROID) 88 MCG tablet Take  88 mcg by mouth daily before breakfast.    . metoprolol tartrate (LOPRESSOR) 25 MG tablet Take 0.5 tablets (12.5 mg total) by mouth 2 (two) times daily. 60 tablet 0  . Omega-3 Fatty Acids (SALMON OIL-1000 PO) Take 1 tablet by mouth daily.     . sennosides-docusate sodium (SENOKOT-S) 8.6-50 MG tablet Take 1 tablet by mouth daily.    . temazepam (RESTORIL) 7.5 MG capsule Take 7.5 mg by mouth at bedtime as needed for sleep.     No current  facility-administered medications for this visit.    SURGICAL HISTORY:  Past Surgical History  Procedure Laterality Date  . Tonsillectomy    . Eye surgery Right Aug. 2014    Cataract  . Eye surgery Left Oct. 2014    Cataract  . Abdominal hysterectomy  1984    Total  . Video bronchoscopy with endobronchial navigation N/A 04/20/2014    Procedure: VIDEO BRONCHOSCOPY WITH ENDOBRONCHIAL NAVIGATION;  Surgeon: Collene Gobble, MD;  Location: [facility];  Service: Thoracic;  Laterality: N/A;  . Video bronchoscopy with endobronchial ultrasound N/A 04/20/2014    Procedure: VIDEO BRONCHOSCOPY WITH ENDOBRONCHIAL ULTRASOUND;  Surgeon: Collene Gobble, MD;  Location: [facility];  Service: Thoracic;  Laterality: N/A;    REVIEW OF SYSTEMS:  Review of Systems  Constitutional: Positive for malaise/fatigue. Negative for fever, chills, weight loss and diaphoresis.  HENT: Negative for congestion, ear discharge, ear pain, hearing loss, nosebleeds, sore throat and tinnitus.   Eyes: Negative for blurred vision, double vision, photophobia, pain, discharge and redness.  Respiratory: Negative for cough, hemoptysis, sputum production, shortness of breath, wheezing and stridor.   Cardiovascular: Negative for chest pain, palpitations, orthopnea, claudication, leg swelling and PND.  Gastrointestinal: Positive for diarrhea. Negative for heartburn, nausea, vomiting, abdominal pain, constipation, blood in stool and melena.  Genitourinary: Negative.   Musculoskeletal: Negative.   Skin: Negative.   Neurological: Positive for weakness. Negative for dizziness, tingling, focal weakness, seizures and headaches.  Endo/Heme/Allergies: Does not bruise/bleed easily.  Psychiatric/Behavioral: Negative for depression. The patient is not nervous/anxious and does not have insomnia.      PHYSICAL EXAMINATION: Physical Exam  Constitutional: She is oriented to person, place, and time and well-developed, well-nourished, and in no distress.   HENT:  Head: Normocephalic and atraumatic.  Mouth/Throat: Oropharynx is clear and moist.  Eyes: Pupils are equal, round, and reactive to light.  Neck: Normal range of motion. Neck supple. No JVD present. No tracheal deviation present. No thyromegaly present.  Cardiovascular: Normal rate, regular rhythm, normal heart sounds and intact distal pulses.  Exam reveals no gallop and no friction rub.   No murmur heard. Pulmonary/Chest: Effort normal and breath sounds normal. No respiratory distress. She has no wheezes. She has no rales.  Abdominal: Soft. Bowel sounds are normal. She exhibits distension. She exhibits no mass. There is no tenderness.  Musculoskeletal: Normal range of motion. She exhibits edema. She exhibits no tenderness.  2-3+ pitting edema bilateral lower extremities to the thigh level  Lymphadenopathy:    She has no cervical adenopathy.  Neurological: She is alert and oriented to person, place, and time. She has normal reflexes. Gait normal.  Skin: Skin is warm and dry. No rash noted.    ECOG PERFORMANCE STATUS: 2 - Symptomatic, <50% confined to bed  Blood pressure 115/75, pulse 111, temperature 98.2 F (36.8 C), temperature source Oral, resp. rate 18, height 5' 7" (1.702 m), weight 180 lb 4.8 oz (81.784 kg), SpO2 97 %.  LABORATORY  DATA: Lab Results  Component Value Date   WBC 3.6* 08/04/2014   HGB 10.6* 08/04/2014   HCT 33.6* 08/04/2014   MCV 109.9* 08/04/2014   PLT 169 08/04/2014      Chemistry      Component Value Date/Time   NA 140 08/04/2014 1412   NA 127* 07/07/2014 0705   K 3.8 08/04/2014 1412   K 4.6 07/07/2014 0705   CL 74* 07/07/2014 0705   CO2 33* 08/04/2014 1412   CO2 45* 07/07/2014 0705   BUN 18.5 08/04/2014 1412   BUN 23 07/07/2014 0705   CREATININE 0.9 08/04/2014 1412   CREATININE 0.98 07/07/2014 0705      Component Value Date/Time   CALCIUM 8.7 08/04/2014 1412   CALCIUM 8.7 07/07/2014 0705   ALKPHOS 136 08/04/2014 1412   ALKPHOS 112  07/02/2014 0342   AST 31 08/04/2014 1412   AST 28 07/02/2014 0342   ALT 18 08/04/2014 1412   ALT 13 07/02/2014 0342   BILITOT 1.04 08/04/2014 1412   BILITOT 1.0 07/02/2014 0342       RADIOGRAPHIC STUDIES:  No results found.   ASSESSMENT/PLAN:  No problem-specific assessment & plan notes found for this encounter.  the patient is a pleasant 81 year old Caucasian female with stage IV (T1b, N3, M1 B) non-small cell lung cancer adenocarcinoma that is positive for EGFR mutation. She has received one cycle of systemic chemotherapy with carboplatin and Alimta that she tolerated relatively well. Unfortunately she has been in the hospital due to issues with recurrent edema and generalized weakness. Her overall performance status has declined making continuing to pursue systemic chemotherapy difficult at this time. She does have an EGFR mutation we can switch her to an oral chemotherapeutic agent. Patient was discussed with and also seen by Dr. Julien Nordmann. She is now being treated with Iressa (gefitinib) 250 mg by mouth daily, status post approximately 6 weeks of therapy. Overall she's tolerating this treatment relatively well with the exception of some intermittent diarrhea. Patient was advised she could take Imodium to control the diarrhea. She voices understanding. She will continue on Iressa at 250 mg by mouth daily She will follow-up in one month with a restaging CT scan of the chest, abdomen and pelvis without contrast to reevaluate her disease.   Awilda Metro E, PA-C 08/04/2014  All questions were answered. The patient knows to call the clinic with any problems, questions or concerns. We can certainly see the patient much sooner if necessary.  ADDENDUM: Hematology/Oncology Attending: I had a face to face encounter with the patient. I recommended her care plan. 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 She status post 1 cycle of systemic chemotherapy was carboplatin and Alimta discontinued secondary to intolerance and the patient was found to have positive EGFR mutation. She is currently undergoing treatment with Iressa 250 mg by mouth daily and tolerating her treatment well. He has no significant skin rash or diarrhea.  I recommended for the patient to continue her current treatment with Iressa. She would come back for follow-up visit in one month after repeating CT scan of the chest, abdomen and pelvis for restaging of her disease. She was advised to call immediately if she has any concerning symptoms in the interval.  Disclaimer: This note was dictated with voice recognition software. Similar sounding words can inadvertently be transcribed and may be missed upon review. MOHAMED,MOHAMED K.,  MD 08/06/2014

## 2014-08-05 ENCOUNTER — Telehealth: Payer: Self-pay | Admitting: Medical Oncology

## 2014-08-05 NOTE — Telephone Encounter (Signed)
Confirming is pt is seen by Dr Julien Nordmann. I confirmed this and referred them to pt PCP.

## 2014-08-09 ENCOUNTER — Telehealth: Payer: Self-pay | Admitting: Internal Medicine

## 2014-08-09 NOTE — Telephone Encounter (Signed)
s.w [facility] living facility and sched pt for appt...no new health problems

## 2014-08-15 ENCOUNTER — Telehealth: Payer: Self-pay | Admitting: *Deleted

## 2014-08-15 ENCOUNTER — Ambulatory Visit: Payer: Medicare Other | Admitting: Physician Assistant

## 2014-08-15 NOTE — Telephone Encounter (Signed)
Pt is still in [place] for rehab- she will be unable to come to her appt today.

## 2014-08-17 ENCOUNTER — Telehealth: Payer: Self-pay | Admitting: Internal Medicine

## 2014-08-17 NOTE — Telephone Encounter (Signed)
Left message to confirm appointment for 02/25. Mailed calendar.

## 2014-08-25 ENCOUNTER — Ambulatory Visit: Payer: Medicare Other | Admitting: Physician Assistant

## 2014-09-01 ENCOUNTER — Ambulatory Visit (HOSPITAL_COMMUNITY)
Admission: RE | Admit: 2014-09-01 | Discharge: 2014-09-01 | Disposition: A | Discharge status: Home / Self Care | Payer: Medicare Other | Source: Ambulatory Visit | Attending: Physician Assistant | Admitting: Physician Assistant

## 2014-09-01 ENCOUNTER — Other Ambulatory Visit: Payer: Medicare Other

## 2014-09-01 ENCOUNTER — Ambulatory Visit (HOSPITAL_BASED_OUTPATIENT_CLINIC_OR_DEPARTMENT_OTHER): Payer: Medicare Other

## 2014-09-01 DIAGNOSIS — C3400 Malignant neoplasm of unspecified main bronchus: Secondary | ICD-10-CM

## 2014-09-01 DIAGNOSIS — C3490 Malignant neoplasm of unspecified part of unspecified bronchus or lung: Secondary | ICD-10-CM | POA: Insufficient documentation

## 2014-09-01 DIAGNOSIS — J449 Chronic obstructive pulmonary disease, unspecified: Secondary | ICD-10-CM | POA: Insufficient documentation

## 2014-09-01 DIAGNOSIS — C3412 Malignant neoplasm of upper lobe, left bronchus or lung: Secondary | ICD-10-CM

## 2014-09-01 LAB — COMPREHENSIVE METABOLIC PANEL (CC13)
ALBUMIN: 2.7 g/dL — AB (ref 3.5–5.0)
ALK PHOS: 154 U/L — AB (ref 40–150)
ALT: 12 U/L (ref 0–55)
AST: 38 U/L — ABNORMAL HIGH (ref 5–34)
Anion Gap: 11 mEq/L (ref 3–11)
BUN: 30.2 mg/dL — ABNORMAL HIGH (ref 7.0–26.0)
CO2: 36 mEq/L — ABNORMAL HIGH (ref 22–29)
Calcium: 9.2 mg/dL (ref 8.4–10.4)
Chloride: 90 mEq/L — ABNORMAL LOW (ref 98–109)
Creatinine: 1.1 mg/dL (ref 0.6–1.1)
EGFR: 46 mL/min/{1.73_m2} — ABNORMAL LOW (ref >90)
GLUCOSE: 114 mg/dL (ref 70–140)
POTASSIUM: 4.7 meq/L (ref 3.5–5.1)
SODIUM: 137 meq/L (ref 136–145)
TOTAL PROTEIN: 6.6 g/dL (ref 6.4–8.3)
Total Bilirubin: 1.04 mg/dL (ref 0.20–1.20)

## 2014-09-01 LAB — CBC WITH DIFFERENTIAL/PLATELET
BASO%: 0.4 % (ref 0.0–2.0)
Basophils Absolute: 0 10*3/uL (ref 0.0–0.1)
EOS%: 0.9 % (ref 0.0–7.0)
Eosinophils Absolute: 0.1 10*3/uL (ref 0.0–0.5)
HCT: 36.4 % (ref 34.8–46.6)
HEMOGLOBIN: 11.2 g/dL — AB (ref 11.6–15.9)
LYMPH#: 0.6 10*3/uL — AB (ref 0.9–3.3)
LYMPH%: 10.7 % — ABNORMAL LOW (ref 14.0–49.7)
MCH: 33.9 pg (ref 25.1–34.0)
MCHC: 30.8 g/dL — AB (ref 31.5–36.0)
MCV: 110.3 fL — ABNORMAL HIGH (ref 79.5–101.0)
MONO#: 0.6 10*3/uL (ref 0.1–0.9)
MONO%: 11.1 % (ref 0.0–14.0)
NEUT#: 4.2 10*3/uL (ref 1.5–6.5)
NEUT%: 76.9 % — ABNORMAL HIGH (ref 38.4–76.8)
Platelets: 161 10*3/uL (ref 145–400)
RBC: 3.3 10*6/uL — ABNORMAL LOW (ref 3.70–5.45)
RDW: 17 % — ABNORMAL HIGH (ref 11.2–14.5)
WBC: 5.4 10*3/uL (ref 3.9–10.3)

## 2014-09-04 ENCOUNTER — Emergency Department (HOSPITAL_COMMUNITY): Payer: Medicare Other

## 2014-09-04 ENCOUNTER — Inpatient Hospital Stay (HOSPITAL_COMMUNITY)
Admission: EM | Admit: 2014-09-04 | Discharge: 2014-09-06 | DRG: 190 | Disposition: A | Discharge status: Skilled Nursing Facility | Payer: Medicare Other | Attending: Internal Medicine | Admitting: Internal Medicine

## 2014-09-04 ENCOUNTER — Other Ambulatory Visit: Payer: Self-pay

## 2014-09-04 ENCOUNTER — Encounter (HOSPITAL_COMMUNITY): Payer: Self-pay | Admitting: *Deleted

## 2014-09-04 DIAGNOSIS — Z9842 Cataract extraction status, left eye: Secondary | ICD-10-CM | POA: Diagnosis not present

## 2014-09-04 DIAGNOSIS — R7989 Other specified abnormal findings of blood chemistry: Secondary | ICD-10-CM | POA: Diagnosis present

## 2014-09-04 DIAGNOSIS — R627 Adult failure to thrive: Secondary | ICD-10-CM

## 2014-09-04 DIAGNOSIS — J9621 Acute and chronic respiratory failure with hypoxia: Secondary | ICD-10-CM | POA: Diagnosis present

## 2014-09-04 DIAGNOSIS — C3490 Malignant neoplasm of unspecified part of unspecified bronchus or lung: Secondary | ICD-10-CM | POA: Diagnosis present

## 2014-09-04 DIAGNOSIS — D539 Nutritional anemia, unspecified: Secondary | ICD-10-CM | POA: Diagnosis present

## 2014-09-04 DIAGNOSIS — Z86718 Personal history of other venous thrombosis and embolism: Secondary | ICD-10-CM | POA: Diagnosis not present

## 2014-09-04 DIAGNOSIS — Z9841 Cataract extraction status, right eye: Secondary | ICD-10-CM

## 2014-09-04 DIAGNOSIS — Z882 Allergy status to sulfonamides status: Secondary | ICD-10-CM | POA: Diagnosis not present

## 2014-09-04 DIAGNOSIS — I482 Chronic atrial fibrillation, unspecified: Secondary | ICD-10-CM | POA: Diagnosis present

## 2014-09-04 DIAGNOSIS — Z881 Allergy status to other antibiotic agents status: Secondary | ICD-10-CM

## 2014-09-04 DIAGNOSIS — J441 Chronic obstructive pulmonary disease with (acute) exacerbation: Principal | ICD-10-CM | POA: Diagnosis present

## 2014-09-04 DIAGNOSIS — Z90710 Acquired absence of both cervix and uterus: Secondary | ICD-10-CM | POA: Diagnosis not present

## 2014-09-04 DIAGNOSIS — E039 Hypothyroidism, unspecified: Secondary | ICD-10-CM | POA: Diagnosis present

## 2014-09-04 DIAGNOSIS — M1990 Unspecified osteoarthritis, unspecified site: Secondary | ICD-10-CM | POA: Diagnosis present

## 2014-09-04 DIAGNOSIS — I272 Other secondary pulmonary hypertension: Secondary | ICD-10-CM | POA: Diagnosis present

## 2014-09-04 DIAGNOSIS — I739 Peripheral vascular disease, unspecified: Secondary | ICD-10-CM | POA: Diagnosis present

## 2014-09-04 DIAGNOSIS — R778 Other specified abnormalities of plasma proteins: Secondary | ICD-10-CM | POA: Diagnosis present

## 2014-09-04 DIAGNOSIS — Z85118 Personal history of other malignant neoplasm of bronchus and lung: Secondary | ICD-10-CM

## 2014-09-04 DIAGNOSIS — N179 Acute kidney failure, unspecified: Secondary | ICD-10-CM | POA: Diagnosis present

## 2014-09-04 DIAGNOSIS — D696 Thrombocytopenia, unspecified: Secondary | ICD-10-CM | POA: Diagnosis present

## 2014-09-04 DIAGNOSIS — R740 Nonspecific elevation of levels of transaminase and lactic acid dehydrogenase [LDH]: Secondary | ICD-10-CM

## 2014-09-04 DIAGNOSIS — I480 Paroxysmal atrial fibrillation: Secondary | ICD-10-CM | POA: Diagnosis present

## 2014-09-04 DIAGNOSIS — N189 Chronic kidney disease, unspecified: Secondary | ICD-10-CM

## 2014-09-04 DIAGNOSIS — Z87440 Personal history of urinary (tract) infections: Secondary | ICD-10-CM | POA: Diagnosis not present

## 2014-09-04 DIAGNOSIS — Z86711 Personal history of pulmonary embolism: Secondary | ICD-10-CM | POA: Diagnosis not present

## 2014-09-04 DIAGNOSIS — E860 Dehydration: Secondary | ICD-10-CM | POA: Diagnosis present

## 2014-09-04 DIAGNOSIS — R4701 Aphasia: Secondary | ICD-10-CM

## 2014-09-04 DIAGNOSIS — Z9981 Dependence on supplemental oxygen: Secondary | ICD-10-CM | POA: Diagnosis not present

## 2014-09-04 DIAGNOSIS — Z888 Allergy status to other drugs, medicaments and biological substances status: Secondary | ICD-10-CM | POA: Diagnosis not present

## 2014-09-04 DIAGNOSIS — I10 Essential (primary) hypertension: Secondary | ICD-10-CM | POA: Diagnosis present

## 2014-09-04 DIAGNOSIS — R7401 Elevation of levels of liver transaminase levels: Secondary | ICD-10-CM | POA: Diagnosis present

## 2014-09-04 DIAGNOSIS — R531 Weakness: Secondary | ICD-10-CM

## 2014-09-04 DIAGNOSIS — Z7901 Long term (current) use of anticoagulants: Secondary | ICD-10-CM

## 2014-09-04 DIAGNOSIS — Z7982 Long term (current) use of aspirin: Secondary | ICD-10-CM

## 2014-09-04 DIAGNOSIS — K219 Gastro-esophageal reflux disease without esophagitis: Secondary | ICD-10-CM | POA: Diagnosis present

## 2014-09-04 HISTORY — DX: Acute and chronic respiratory failure with hypoxia: J96.21

## 2014-09-04 HISTORY — DX: Other shock: R57.8

## 2014-09-04 HISTORY — DX: Hemorrhage, not elsewhere classified: R58

## 2014-09-04 HISTORY — DX: Malignant neoplasm of unspecified main bronchus: C34.00

## 2014-09-04 HISTORY — DX: Pulmonary hypertension, unspecified: I27.20

## 2014-09-04 HISTORY — DX: Malignant neoplasm of unspecified part of unspecified bronchus or lung: C34.90

## 2014-09-04 LAB — CBC WITH DIFFERENTIAL/PLATELET
BASOS ABS: 0 10*3/uL (ref 0.0–0.1)
Basophils Relative: 0 % (ref 0–1)
Eosinophils Absolute: 0.1 10*3/uL (ref 0.0–0.7)
Eosinophils Relative: 1 % (ref 0–5)
HEMATOCRIT: 35.5 % — AB (ref 36.0–46.0)
Hemoglobin: 11.3 g/dL — ABNORMAL LOW (ref 12.0–15.0)
LYMPHS ABS: 0.6 10*3/uL — AB (ref 0.7–4.0)
LYMPHS PCT: 11 % — AB (ref 12–46)
MCH: 34.2 pg — ABNORMAL HIGH (ref 26.0–34.0)
MCHC: 31.8 g/dL (ref 30.0–36.0)
MCV: 107.6 fL — AB (ref 78.0–100.0)
MONO ABS: 0.7 10*3/uL (ref 0.1–1.0)
MONOS PCT: 13 % — AB (ref 3–12)
NEUTROS ABS: 4 10*3/uL (ref 1.7–7.7)
Neutrophils Relative %: 75 % (ref 43–77)
Platelets: 122 10*3/uL — ABNORMAL LOW (ref 150–400)
RBC: 3.3 MIL/uL — AB (ref 3.87–5.11)
RDW: 16.8 % — ABNORMAL HIGH (ref 11.5–15.5)
WBC: 5.3 10*3/uL (ref 4.0–10.5)

## 2014-09-04 LAB — COMPREHENSIVE METABOLIC PANEL
ALBUMIN: 2.9 g/dL — AB (ref 3.5–5.2)
ALK PHOS: 135 U/L — AB (ref 39–117)
ALT: 19 U/L (ref 0–35)
ANION GAP: 7 (ref 5–15)
AST: 39 U/L — ABNORMAL HIGH (ref 0–37)
BILIRUBIN TOTAL: 1 mg/dL (ref 0.3–1.2)
BUN: 42 mg/dL — ABNORMAL HIGH (ref 6–23)
CO2: 39 mmol/L — AB (ref 19–32)
Calcium: 9.5 mg/dL (ref 8.4–10.5)
Chloride: 90 mmol/L — ABNORMAL LOW (ref 96–112)
Creatinine, Ser: 1.36 mg/dL — ABNORMAL HIGH (ref 0.50–1.10)
GFR calc Af Amer: 42 mL/min — ABNORMAL LOW (ref >90)
GFR calc non Af Amer: 36 mL/min — ABNORMAL LOW (ref >90)
Glucose, Bld: 96 mg/dL (ref 70–99)
POTASSIUM: 4.5 mmol/L (ref 3.5–5.1)
Sodium: 136 mmol/L (ref 135–145)
Total Protein: 6.8 g/dL (ref 6.0–8.3)

## 2014-09-04 LAB — TROPONIN I
Troponin I: 0.04 ng/mL — ABNORMAL HIGH (ref <0.031)
Troponin I: 0.04 ng/mL — ABNORMAL HIGH (ref <0.031)
Troponin I: 0.03 ng/mL (ref <0.031)

## 2014-09-04 LAB — URINALYSIS, ROUTINE W REFLEX MICROSCOPIC
Bilirubin Urine: NEGATIVE
GLUCOSE, UA: NEGATIVE mg/dL
Hgb urine dipstick: NEGATIVE
Ketones, ur: NEGATIVE mg/dL
LEUKOCYTES UA: NEGATIVE
Nitrite: NEGATIVE
PROTEIN: NEGATIVE mg/dL
SPECIFIC GRAVITY, URINE: 1.01 (ref 1.005–1.030)
Urobilinogen, UA: 0.2 mg/dL (ref 0.0–1.0)
pH: 6 (ref 5.0–8.0)

## 2014-09-04 LAB — TSH: TSH: 1.653 u[IU]/mL (ref 0.350–4.500)

## 2014-09-04 LAB — VITAMIN B12: Vitamin B-12: 1836 pg/mL — ABNORMAL HIGH (ref 211–911)

## 2014-09-04 LAB — MRSA PCR SCREENING: MRSA by PCR: NEGATIVE

## 2014-09-04 MED ORDER — IPRATROPIUM-ALBUTEROL 0.5-2.5 (3) MG/3ML IN SOLN
3.0000 mL | Freq: Four times a day (QID) | RESPIRATORY_TRACT | Status: DC
  Administered 2014-09-04 – 2014-09-06 (×8): 3 mL via RESPIRATORY_TRACT
  Filled 2014-09-04 (×9): qty 3

## 2014-09-04 MED ORDER — SENNOSIDES-DOCUSATE SODIUM 8.6-50 MG PO TABS
1.0000 | ORAL_TABLET | Freq: Every day | ORAL | Status: DC
  Administered 2014-09-05 – 2014-09-06 (×2): 1 via ORAL
  Filled 2014-09-04 (×3): qty 1

## 2014-09-04 MED ORDER — ENSURE COMPLETE PO LIQD
237.0000 mL | Freq: Two times a day (BID) | ORAL | Status: DC
  Administered 2014-09-04 – 2014-09-06 (×5): 237 mL via ORAL

## 2014-09-04 MED ORDER — SODIUM CHLORIDE 0.9 % IV BOLUS (SEPSIS)
500.0000 mL | Freq: Once | INTRAVENOUS | Status: AC
  Administered 2014-09-04: 500 mL via INTRAVENOUS

## 2014-09-04 MED ORDER — ACETAMINOPHEN 325 MG PO TABS: 650.0000 mg | ORAL_TABLET | Freq: Four times a day (QID) | ORAL | Status: DC | PRN

## 2014-09-04 MED ORDER — MORPHINE SULFATE 2 MG/ML IJ SOLN: 2.0000 mg | INTRAMUSCULAR | Status: DC | PRN

## 2014-09-04 MED ORDER — SODIUM CHLORIDE 0.9 % IV BOLUS (SEPSIS)
500.0000 mL | Freq: Once | INTRAVENOUS | Status: AC
Start: 2014-09-04 — End: 2014-09-04
  Administered 2014-09-04: 500 mL via INTRAVENOUS

## 2014-09-04 MED ORDER — LEVOTHYROXINE SODIUM 88 MCG PO TABS
88.0000 ug | ORAL_TABLET | Freq: Every day | ORAL | Status: DC
  Administered 2014-09-05 – 2014-09-06 (×2): 88 ug via ORAL
  Filled 2014-09-04 (×2): qty 1

## 2014-09-04 MED ORDER — LEVOFLOXACIN IN D5W 500 MG/100ML IV SOLN
500.0000 mg | INTRAVENOUS | Status: DC
  Administered 2014-09-04 – 2014-09-06 (×2): 500 mg via INTRAVENOUS
  Filled 2014-09-04 (×2): qty 100

## 2014-09-04 MED ORDER — HYDROCODONE-ACETAMINOPHEN 5-325 MG PO TABS
1.0000 | ORAL_TABLET | ORAL | Status: DC | PRN
  Administered 2014-09-05: 1 via ORAL
  Filled 2014-09-04: qty 1

## 2014-09-04 MED ORDER — GEFITINIB 250 MG PO TABS
250.0000 mg | ORAL_TABLET | Freq: Every day | ORAL | Status: DC
  Administered 2014-09-05 – 2014-09-06 (×2): 250 mg via ORAL

## 2014-09-04 MED ORDER — BENZONATATE 100 MG PO CAPS
100.0000 mg | ORAL_CAPSULE | Freq: Three times a day (TID) | ORAL | Status: DC
  Administered 2014-09-04 – 2014-09-06 (×8): 100 mg via ORAL
  Filled 2014-09-04 (×8): qty 1

## 2014-09-04 MED ORDER — GUAIFENESIN-DM 100-10 MG/5ML PO SYRP: 5.0000 mL | ORAL_SOLUTION | ORAL | Status: DC | PRN

## 2014-09-04 MED ORDER — IPRATROPIUM-ALBUTEROL 0.5-2.5 (3) MG/3ML IN SOLN
3.0000 mL | Freq: Once | RESPIRATORY_TRACT | Status: AC
  Administered 2014-09-04: 3 mL via RESPIRATORY_TRACT
  Filled 2014-09-04: qty 3

## 2014-09-04 MED ORDER — CALCIUM CARBONATE-VITAMIN D 500-200 MG-UNIT PO TABS
1.0000 | ORAL_TABLET | Freq: Every day | ORAL | Status: DC
  Administered 2014-09-05 – 2014-09-06 (×2): 1 via ORAL
  Filled 2014-09-04 (×2): qty 1

## 2014-09-04 MED ORDER — ONDANSETRON HCL 4 MG PO TABS: 4.0000 mg | ORAL_TABLET | Freq: Four times a day (QID) | ORAL | Status: DC | PRN

## 2014-09-04 MED ORDER — METHYLPREDNISOLONE SODIUM SUCC 125 MG IJ SOLR
125.0000 mg | Freq: Once | INTRAMUSCULAR | Status: AC
  Administered 2014-09-04: 125 mg via INTRAVENOUS
  Filled 2014-09-04: qty 2

## 2014-09-04 MED ORDER — POTASSIUM CHLORIDE IN NACL 20-0.9 MEQ/L-% IV SOLN
INTRAVENOUS | Status: DC
  Administered 2014-09-04 – 2014-09-05 (×3): via INTRAVENOUS

## 2014-09-04 MED ORDER — FOLIC ACID 1 MG PO TABS
1.0000 mg | ORAL_TABLET | Freq: Every day | ORAL | Status: DC
  Administered 2014-09-04 – 2014-09-06 (×3): 1 mg via ORAL
  Filled 2014-09-04 (×3): qty 1

## 2014-09-04 MED ORDER — ACETAMINOPHEN 650 MG RE SUPP: 650.0000 mg | Freq: Four times a day (QID) | RECTAL | Status: DC | PRN

## 2014-09-04 MED ORDER — ONDANSETRON HCL 4 MG/2ML IJ SOLN: 4.0000 mg | Freq: Four times a day (QID) | INTRAMUSCULAR | Status: DC | PRN

## 2014-09-04 MED ORDER — TEMAZEPAM 7.5 MG PO CAPS
7.5000 mg | ORAL_CAPSULE | Freq: Every evening | ORAL | Status: DC | PRN
  Administered 2014-09-05: 7.5 mg via ORAL
  Filled 2014-09-04: qty 1

## 2014-09-04 NOTE — ED Provider Notes (Signed)
CSN: [id number]     Arrival date & time 09/04/14  0620 History   First MD Initiated Contact with Patient 09/04/14 [phone]     Chief Complaint  Patient presents with  . Weakness     (Consider location/radiation/quality/duration/timing/severity/associated sxs/prior Treatment) HPI  This is a 81 year old female with a history of COPD, non-small cell lung cancer, PE, atrial fibrillation who presents with generalized weakness. Patient was just recently discharged from rehabilitation facility to live with her daughter. Patient reports that she got up to go to the bathroom this morning and was unable to get up secondary to generalized weakness. She normally walks with a walker. She denies any focal weakness of her lower extremities. She describes the weakness as a heaviness. She denies any shortness of breath or chest pain. Per EMS report, patient initially did report shortness of breath which she currently denies.  She is on 2 L of home oxygen at baseline. Patient reports decreased by mouth intake. She also reports occasional lightheadedness. She denies any nausea, vomiting, abdominal pain. She denies any urinary symptoms.  Patient currently taking Iressa for her NSCLC.  Past Medical History  Diagnosis Date  . Ulcer 06-08-13    Left medial ankle  . Hypertension   . Thyroid disease     Hypo-Thyroidism  . Esophageal reflux   . COPD (chronic obstructive pulmonary disease)   . Peripheral vascular disease   . Hypothyroidism   . Insomnia     takes lorazepam  . Arthritis   . Dysrhythmia   . DVT (deep venous thrombosis) 2008  . Paroxysmal a-fib 2008  . PE (pulmonary thromboembolism) 2008  . Wears dentures    Past Surgical History  Procedure Laterality Date  . Tonsillectomy    . Eye surgery Right Aug. 2014    Cataract  . Eye surgery Left Oct. 2014    Cataract  . Abdominal hysterectomy  1984    Total  . Video bronchoscopy with endobronchial navigation N/A 04/20/2014    Procedure: VIDEO  BRONCHOSCOPY WITH ENDOBRONCHIAL NAVIGATION;  Surgeon: Collene Gobble, MD;  Location: [facility];  Service: Thoracic;  Laterality: N/A;  . Video bronchoscopy with endobronchial ultrasound N/A 04/20/2014    Procedure: VIDEO BRONCHOSCOPY WITH ENDOBRONCHIAL ULTRASOUND;  Surgeon: Collene Gobble, MD;  Location: [facility];  Service: Thoracic;  Laterality: N/A;   Family History  Problem Relation Age of Onset  . Heart disease Maternal Grandmother   . Cancer Brother     kidney  . Stroke Brother    History  Substance Use Topics  . Smoking status: Never Smoker   . Smokeless tobacco: Never Used  . Alcohol Use: No   OB History    No data available     Review of Systems  Constitutional: Negative for fever.  Respiratory: Negative for cough, chest tightness and shortness of breath.   Cardiovascular: Positive for leg swelling. Negative for chest pain.  Gastrointestinal: Negative for nausea, vomiting and abdominal pain.  Genitourinary: Negative for dysuria.  Musculoskeletal: Negative for back pain.  Skin: Negative for rash.  Neurological: Positive for weakness and light-headedness. Negative for headaches.  Psychiatric/Behavioral: Negative for confusion.  All other systems reviewed and are negative.     Allergies  Poison ivy extract; Sulfa antibiotics; and Sulfacetamide sodium  Home Medications   Prior to Admission medications   Medication Sig Start Date End Date Taking? Authorizing Provider  acetaminophen (TYLENOL) 500 MG tablet Take 500 mg by mouth every 6 (six) hours as needed (pain).  Historical Provider, MD  albuterol (PROVENTIL) (5 MG/ML) 0.5% nebulizer solution Take 2.5 mg by nebulization every 6 (six) hours as needed for wheezing or shortness of breath.    Historical Provider, MD  calcium-vitamin D (OSCAL WITH D) 500-200 MG-UNIT per tablet Take 1 tablet by mouth daily with breakfast.     Historical Provider, MD  feeding supplement, ENSURE COMPLETE, (ENSURE COMPLETE) LIQD Take 237 mLs by  mouth 2 (two) times daily between meals. 06/06/14   Erick Colace, NP  folic acid (FOLVITE) 1 MG tablet Take 1 tablet (1 mg total) by mouth daily. 05/03/14   Curt Bears, MD  gefitinib (IRESSA) 250 MG tablet Take 1 tablet (250 mg total) by mouth daily. 06/16/14   Curt Bears, MD  levothyroxine (SYNTHROID, LEVOTHROID) 88 MCG tablet Take 88 mcg by mouth daily before breakfast.    Historical Provider, MD  metoprolol tartrate (LOPRESSOR) 25 MG tablet Take 0.5 tablets (12.5 mg total) by mouth 2 (two) times daily. 07/07/14 08/04/14  Florencia Reasons, MD  Omega-3 Fatty Acids (SALMON OIL-1000 PO) Take 1 tablet by mouth daily.     Historical Provider, MD  sennosides-docusate sodium (SENOKOT-S) 8.6-50 MG tablet Take 1 tablet by mouth daily.    Historical Provider, MD  temazepam (RESTORIL) 7.5 MG capsule Take 7.5 mg by mouth at bedtime as needed for sleep.    Historical Provider, MD   There were no vitals taken for this visit. Physical Exam  Constitutional: She is oriented to person, place, and time.  Elderly, frail  HENT:  Head: Normocephalic and atraumatic.  Mucous membranes dry  Neck: Neck supple.  Cardiovascular: Normal rate and normal heart sounds.   No murmur heard. irregular  Pulmonary/Chest: Effort normal. No respiratory distress.  Coarse breath sounds bilaterally, nasal cannula in place, no respiratory distress noted  Abdominal: Soft. Bowel sounds are normal. There is no tenderness. There is no rebound and no guarding.  Protuberant but soft  Musculoskeletal: She exhibits edema.  1+ bilateral lower extremity edema  Neurological: She is alert and oriented to person, place, and time.  Skin: Skin is warm and dry.  Venous stasis of the bilateral feet  Psychiatric: She has a normal mood and affect.  Nursing note and vitals reviewed.   ED Course  Procedures (including critical care time) Labs Review Labs Reviewed  CBC WITH DIFFERENTIAL/PLATELET  COMPREHENSIVE METABOLIC PANEL  URINALYSIS,  ROUTINE W REFLEX MICROSCOPIC  TROPONIN I    Imaging Review No results found.  EKG independently reviewed by myself: Atrial fibrillation with a rate of 77      MDM   Final diagnoses:  None    Patient presents with generalized weakness. Nonfocal on exam. Also reported shortness breath which she now denies. She is on her baseline oxygen requirement satting 93%. Lab work, chest x-ray, EKG obtained.  Shortness of breath could be secondary to COPD/lung cancer. Patient appears generally deconditioned.  Patient signed out to Dr. Leonides Schanz pending workup.   Merryl Hacker, MD 09/04/14 2248

## 2014-09-04 NOTE — Progress Notes (Signed)
Patient admitted with COPD exacerbation & started on Levaquin.  Scr 1.36 estimated CrCl ~ 35-54ml/min Dose adjusted to q48h for CrCl 20-33ml/min per manufacturer recommendation.   Netta Cedars, PharmD, BCPS 09/04/2014@11 :58 AM

## 2014-09-04 NOTE — ED Notes (Signed)
EMS called out for SOB when pt got in the truck pt reported that she was not SOB but her legs feel weak & heavy. Pt was discharged from a rehab facility in [place] a week ago. Pt incontinent of urine upon arrival. Pt says she got up this morning to use the restroom & just was unable to walk, & it has been that way for a while.

## 2014-09-04 NOTE — H&P (Signed)
[facility] History and Physical  Brittney Thomas AUQ:[id number] DOB: [birth year] DOA: 09/04/2014  Referring physician: ED physician, Dr. Leonides Schanz PCP: [name], MD  Oncologist: DR. Inda Merlin   Chief Complaint: Generalized weakness and cough  HPI: Brittney Thomas is a 81 y.o. female with a history of adenocarcinoma of the lung-diagnosed October 2015-started on oral chemotherapy, chronic atrial fibrillation-previously on Coumadin, and history of pulmonary embolism. Her history is also significant for a hospitalization in November 2015 for hemorrhagic shock in the setting of Coumadin therapy-she experienced a retroperitoneal bleed with a rectus sheath hematoma. She was also recently hospitalized in January 2016 for a urinary tract infection. She was discharged to [facility]. Approximately 1 week ago, she returned home. She reports that she has had generalized weakness since she's been home and thought that she was still too weak to leave the rehabilitation center. She denies focal weakness. She denies difficulty speaking or difficulty swallowing. She has had a cough with yellow sputum over the past couple days. She wears oxygen 2-3 L/m as prescribed previously. She has had some wheezing, but no worsening shortness of breath than usual. She denies chest pain or pleurisy. She denies fever or chills. She denies abdominal pain, pain with urination, or lower extremity swelling. She denies black tarry stools or bright red blood per rectum.  In the emergency department, she was afebrile and hemodynamically stable. Her oxygen saturation fell to 86% on 3 L of nasal cannula oxygen, but this was repeated and her oxygen saturation improved to 90%. Her EKG revealed atrial fibrillation with a heart rate of 77 bpm. Her chest x-ray revealed cardiomegaly and pulmonary vascular congestion. Her urinalysis was unremarkable. Her lab data were significant for BUN of 42, creatinine 1.36, AST of  39, troponin I 0.04, hemoglobin 11.3, MCV of 107.6, and platelet count of 122. She is being admitted for further evaluation and management.      Review of Systems:   As above in history present illness. Otherwise review of systems is negative.  Past Medical History  Diagnosis Date  . Ulcer 06-08-13    Left medial ankle  . Hypertension   . Thyroid disease     Hypo-Thyroidism  . Esophageal reflux   . COPD (chronic obstructive pulmonary disease)   . Peripheral vascular disease   . Hypothyroidism   . Insomnia     takes lorazepam  . Arthritis   . Dysrhythmia   . DVT (deep venous thrombosis) 2008  . Paroxysmal a-fib 2008    Anticoag stopped due to rectus sheath bleed  . PE (pulmonary thromboembolism) 2008  . Wears dentures   . Moderate to severe pulmonary hypertension   . Acute and chronic respiratory failure with hypoxia 06/14/2014    December 2015 2 L of oxygen continuously   . Lung cancer, main bronchus 05/03/2014    LUL, non small cell   . Adenocarcinoma, lung 04/15/2014    EBUS 04/20/14 >NON small cell carcinoma , adenocarcinoma ;LEFT >refer to Oncology     . Hemorrhagic shock 05/27/2014  . Spontaneous hemorrhage     Rectus sheath hematoma, while on anticoag   Past Surgical History  Procedure Laterality Date  . Tonsillectomy    . Eye surgery Right Aug. 2014    Cataract  . Eye surgery Left Oct. 2014    Cataract  . Abdominal hysterectomy  1984    Total  . Video bronchoscopy with endobronchial navigation N/A 04/20/2014    Procedure: VIDEO  BRONCHOSCOPY WITH ENDOBRONCHIAL NAVIGATION;  Surgeon: Collene Gobble, MD;  Location: [facility];  Service: Thoracic;  Laterality: N/A;  . Video bronchoscopy with endobronchial ultrasound N/A 04/20/2014    Procedure: VIDEO BRONCHOSCOPY WITH ENDOBRONCHIAL ULTRASOUND;  Surgeon: Collene Gobble, MD;  Location: [facility];  Service: Thoracic;  Laterality: N/A;   Social History: She lives with her daughter [name]. She relates with a walker. She denies  tobacco, alcohol, or illicit drug use.   Allergies  Allergen Reactions  . Poison Ivy Extract [Extract Of Poison Ivy] Rash  . Sulfa Antibiotics Rash  . Sulfacetamide Sodium Rash    Family History  Problem Relation Age of Onset  . Heart disease Maternal Grandmother   . Cancer Brother     kidney  . Stroke Brother     Prior to Admission medications   Medication Sig Start Date End Date Taking? Authorizing Provider  acetaminophen (TYLENOL) 500 MG tablet Take 500 mg by mouth every 6 (six) hours as needed (pain).    Historical Provider, MD  albuterol (PROVENTIL) (5 MG/ML) 0.5% nebulizer solution Take 2.5 mg by nebulization every 6 (six) hours as needed for wheezing or shortness of breath.    Historical Provider, MD  calcium-vitamin D (OSCAL WITH D) 500-200 MG-UNIT per tablet Take 1 tablet by mouth daily with breakfast.     Historical Provider, MD  feeding supplement, ENSURE COMPLETE, (ENSURE COMPLETE) LIQD Take 237 mLs by mouth 2 (two) times daily between meals. 06/06/14   Erick Colace, NP  folic acid (FOLVITE) 1 MG tablet Take 1 tablet (1 mg total) by mouth daily. 05/03/14   Curt Bears, MD  gefitinib (IRESSA) 250 MG tablet Take 1 tablet (250 mg total) by mouth daily. 06/16/14   Curt Bears, MD  levothyroxine (SYNTHROID, LEVOTHROID) 88 MCG tablet Take 88 mcg by mouth daily before breakfast.    Historical Provider, MD  metoprolol tartrate (LOPRESSOR) 25 MG tablet Take 0.5 tablets (12.5 mg total) by mouth 2 (two) times daily. 07/07/14 08/04/14  Florencia Reasons, MD  Omega-3 Fatty Acids (SALMON OIL-1000 PO) Take 1 tablet by mouth daily.     Historical Provider, MD  sennosides-docusate sodium (SENOKOT-S) 8.6-50 MG tablet Take 1 tablet by mouth daily.    Historical Provider, MD  temazepam (RESTORIL) 7.5 MG capsule Take 7.5 mg by mouth at bedtime as needed for sleep.    Historical Provider, MD   Physical Exam: Filed Vitals:   09/04/14 0900 09/04/14 0912 09/04/14 1015 09/04/14 1102  BP: 124/85   111/69 119/88  Pulse: 85  72 94  Temp:   97.9 F (36.6 C) 97.4 F (36.3 C)  TempSrc:   Oral Oral  Resp: 19  23 20   Height:    5\' 7"  (1.702 m)  Weight:    83.054 kg (183 lb 1.6 oz)  SpO2: 93% 96% 97% 94%    Wt Readings from Last 3 Encounters:  09/04/14 83.054 kg (183 lb 1.6 oz)  08/04/14 81.784 kg (180 lb 4.8 oz)  07/07/14 84.1 kg (185 lb 6.5 oz)    General:  Appears calm and comfortable; 81 year old woman in no acute distress. Eyes: PERRL, normal lids, irises & conjunctiva; conjunctivae are clear and sclerae are white. ENT: grossly normal hearing, lips & tongue; oropharynx with mildly dry mucous membranes. Neck: no LAD, masses or thyromegaly Cardiovascular: Irregular, irregular. Telemetry: Atrial fibrillation Respiratory: Occasional wheezes in the upper lobes; breathing nonlabored. Abdomen: soft, ntnd, positive bowel sounds, obese, no masses palpated. Skin: no rash or  induration seen on limited exam Musculoskeletal: grossly normal tone BUE/BLE; no acute hot red joints. No pedal edema. Psychiatric: grossly normal mood and affect, speech fluent and appropriate Neurologic: grossly non-focal; cranial nerves II through XII grossly intact.           Labs on Admission:  Basic Metabolic Panel:  Recent Labs Lab 09/01/14 1040 09/04/14 0644  NA 137 136  K 4.7 4.5  CL  --  90*  CO2 36* 39*  GLUCOSE 114 96  BUN 30.2* 42*  CREATININE 1.1 1.36*  CALCIUM 9.2 9.5   Liver Function Tests:  Recent Labs Lab 09/01/14 1040 09/04/14 0644  AST 38* 39*  ALT 12 19  ALKPHOS 154* 135*  BILITOT 1.04 1.0  PROT 6.6 6.8  ALBUMIN 2.7* 2.9*   No results for input(s): LIPASE, AMYLASE in the last 168 hours. No results for input(s): AMMONIA in the last 168 hours. CBC:  Recent Labs Lab 09/01/14 1040 09/04/14 0644  WBC 5.4 5.3  NEUTROABS 4.2 4.0  HGB 11.2* 11.3*  HCT 36.4 35.5*  MCV 110.3* 107.6*  PLT 161 122*   Cardiac Enzymes:  Recent Labs Lab 09/04/14 0644  TROPONINI  0.04*    BNP (last 3 results)  Recent Labs  06/26/14 2353  BNP 620.7*    ProBNP (last 3 results) No results for input(s): PROBNP in the last 8760 hours.  CBG: No results for input(s): GLUCAP in the last 168 hours.  Radiological Exams on Admission: Dg Chest Port 1 View  09/04/2014   CLINICAL DATA:  Cough.  Weakness.  Initial encounter.  EXAM: PORTABLE CHEST - 1 VIEW  COMPARISON:  09/01/2014 CT.  FINDINGS: Cardiomegaly is present. Pulmonary vascular congestion. No airspace consolidation is identified. The pulmonary nodule on prior chest CT is not evident, likely due to bony overlap at the LEFT apex. Monitoring leads project over the chest. Aortic arch atherosclerosis. Old RIGHT midshaft clavicle fracture.  IMPRESSION: Cardiomegaly and pulmonary vascular congestion.   Electronically Signed   By: Dereck Ligas M.D.   On: 09/04/2014 07:53    EKG: Independently reviewed. As above in history of present illness.  Assessment/Plan Principal Problem:   Generalized weakness Active Problems:   COPD exacerbation   AKI (acute kidney injury)   Adenocarcinoma, lung   Acute and chronic respiratory failure with hypoxia   Chronic atrial fibrillation   Moderate to severe pulmonary hypertension   Essential hypertension, benign   Thrombocytopenia   Macrocytic anemia   Elevated troponin I level   1. Generalized weakness, likely multifactorial including deconditioning, dehydration, COPD exacerbation, and chronic illnesses including lung cancer, moderately severe pulmonary hypertension, and chronic respiratory failure. We'll treat her conservatively and will order PT for further evaluation and management. She may need rehabilitation again at the time of discharge. 2. Oxygen-dependent COPD with exacerbation. Her exacerbation does not appear to be severe. Given her history, there is likely an infectious component. She was given IV Solu-Medrol in the ED. We'll continue Solu-Medrol. Will add Levaquin  empirically. Will start duo nebulizers. Will start Tessalon Perles for cough. 3. Acute on chronic respiratory failure with hypoxia. This is likely secondary to COPD exacerbation. Will continue oxygen titrated to keep her oxygen saturation is at 90% or above. 4. Acute kidney injury. Her creatinine is 1.36 and BUN 42 on admission. Several days ago, her creatinine was 1.1 and it was 0.9 in 08/2014. She appears to be mildly volume depleted and prerenal. Will start gentle IV fluids and follow her renal function.  5. Elevated troponin I. She has no complaints of chest pain. Elevation is marginal and may be secondary to COPD exacerbation. We'll continue to monitor and cycle cardiac enzymes for completion. 6. Metastatic Adenocarcinoma of the lung, diagnosed October 2015. The patient is under the care of Dr. Earlie Server. She is being treated with oral chemotherapeutic agent, Iressa-this will be continued. 7. Macrocytic anemia and thrombocytopenia. Both are likely the consequence of chemotherapy. We'll continue to monitor. Will order a TSH and vitamin B12 level for further evaluation.    Code Status: Full code DVT Prophylaxis: SCDs Family Communication: No family available Disposition Plan: Discharge when clinically appropriate; possible need for discharge to skilled nursing facility.  Time spent: One hour.  [facility] Pager [phone]

## 2014-09-04 NOTE — ED Notes (Addendum)
Pt took appx 3 steps before saying she could not continue to walk. Oxygen level at 90% on 3 L while standing, 95% on 3 L when lying back down.

## 2014-09-04 NOTE — ED Provider Notes (Signed)
8:00 AM  Assumed care from Dr. Dina Rich.  Pt is a 81 y.o. female with history of hypertension, COPD, lung cancer on Iressa who is not on chemotherapy due to intolerance who presents emergency department with generalized weakness. EMS reports they were called out for shortness of breath which patient denies. She was recently discharged from a rehabilitation facility. States that she try to get out of bed this morning she could not walk to the bathroom. She uses a walker at baseline. No focal neurologic deficits on exam. She does have some diminished breath sounds and mild expiratory wheezing. When I entered the room patient is hypoxic but she has her oxygen out of her nose. Have advised patient to keep her oxygen on and we'll give DuoNeb treatment. She is receiving IV fluids. She does have mild elevation of her creatinine. Urine shows no sign of infection.  9:05 AM  Pt unable to ambulate more than 3 steps. She does become hypoxic quickly. Suspect mild COPD exacerbation, failure to thrive, deconditioning. Will give another DuoNeb treatment and Solu-Medrol. She also has mild acute renal failure. We'll discuss with hospitalist for admission.  9:40 AM  Spoke with Dr. Caryn Section for admission to telemetry, inpatient. Will place holding orders.   EKG Interpretation  Date/Time:  Sunday September 04 2014 06:41:03 EST Ventricular Rate:  77 PR Interval:    QRS Duration: 98 QT Interval:  333 QTC Calculation: 377 R Axis:   109 Text Interpretation:  Atrial fibrillation Right axis deviation Low voltage, extremity leads No significant change since last tracing Confirmed by Jarquez Mestre,  DO, Arcenio Mullaly [phone]) on 09/04/2014 8:11:12 AM        [place], DO 09/04/14 1552

## 2014-09-04 NOTE — Progress Notes (Signed)
Patient appeared different from baseline according to daughter. Patient is usually sharp in responses but now appears to have delayed response and repeats herself. With neuro check response is the only abnormal assessment at this time. Vital signs WNL. MD notified. Order given to do q4 neuro checks. Will continue to monitor.

## 2014-09-04 NOTE — ED Notes (Signed)
Resp paged for breathing treatment. Patient to be placed on humidified oxygen after treatment-respiratory aware.

## 2014-09-04 NOTE — ED Notes (Signed)
Resp paged for breathing treatment.  

## 2014-09-04 NOTE — Progress Notes (Signed)
Received call from [facility], pharmacist, stating we do not carry and cannot order the medication Iressa.  I called the patient's daughter, and she is going to bring the medication to send to pharmacy to dispense while the patient is admitted.

## 2014-09-04 NOTE — Progress Notes (Signed)
Because patient is on O2 therapy [place], I attempted to place patient on "At risk" oral protocol however patient is allergic to an ingredient in the oral mouthwash.  Will encourage patient to brush teeth BID.

## 2014-09-05 ENCOUNTER — Other Ambulatory Visit: Payer: Self-pay | Admitting: Physician Assistant

## 2014-09-05 ENCOUNTER — Inpatient Hospital Stay (HOSPITAL_COMMUNITY): Payer: Medicare Other

## 2014-09-05 DIAGNOSIS — R7401 Elevation of levels of liver transaminase levels: Secondary | ICD-10-CM | POA: Diagnosis present

## 2014-09-05 DIAGNOSIS — R740 Nonspecific elevation of levels of transaminase and lactic acid dehydrogenase [LDH]: Secondary | ICD-10-CM

## 2014-09-05 DIAGNOSIS — Z86711 Personal history of pulmonary embolism: Secondary | ICD-10-CM | POA: Diagnosis present

## 2014-09-05 DIAGNOSIS — N179 Acute kidney failure, unspecified: Secondary | ICD-10-CM

## 2014-09-05 LAB — BASIC METABOLIC PANEL
Anion gap: 10 (ref 5–15)
BUN: 40 mg/dL — ABNORMAL HIGH (ref 6–23)
CALCIUM: 9.1 mg/dL (ref 8.4–10.5)
CO2: 34 mmol/L — ABNORMAL HIGH (ref 19–32)
Chloride: 93 mmol/L — ABNORMAL LOW (ref 96–112)
Creatinine, Ser: 1.24 mg/dL — ABNORMAL HIGH (ref 0.50–1.10)
GFR calc Af Amer: 47 mL/min — ABNORMAL LOW (ref >90)
GFR calc non Af Amer: 40 mL/min — ABNORMAL LOW (ref >90)
GLUCOSE: 126 mg/dL — AB (ref 70–99)
Potassium: 4.5 mmol/L (ref 3.5–5.1)
Sodium: 137 mmol/L (ref 135–145)

## 2014-09-05 LAB — TROPONIN I: TROPONIN I: 0.03 ng/mL (ref <0.031)

## 2014-09-05 MED ORDER — METHYLPREDNISOLONE SODIUM SUCC 40 MG IJ SOLR
40.0000 mg | Freq: Two times a day (BID) | INTRAMUSCULAR | Status: DC
  Administered 2014-09-05 – 2014-09-06 (×3): 40 mg via INTRAVENOUS
  Filled 2014-09-05 (×3): qty 1

## 2014-09-05 NOTE — Progress Notes (Signed)
UR chart review completed.  

## 2014-09-05 NOTE — Clinical Social Work Psychosocial (Signed)
Clinical Social Work Department BRIEF PSYCHOSOCIAL ASSESSMENT 09/05/2014  Patient:  Brittney Thomas, Brittney Thomas     Account Number:  [id number]     Admit date:  09/04/2014  Clinical Social Worker:  Legrand Como  Date/Time:  09/05/2014 12:36 PM  Referred by:  CSW  Date Referred:  09/05/2014 Referred for  Other - See comment   Other Referral:   COPD Gold   Interview type:  Patient Other interview type:    PSYCHOSOCIAL DATA Living Status:  WITH ADULT CHILDREN Admitted from facility:   Level of care:   Primary support name:  Gerrit Friends Primary support relationship to patient:  CHILD, ADULT Degree of support available:   Patient lives with daughter. She is very supportive per patient.    CURRENT CONCERNS Current Concerns  Other - See comment   Other Concerns:   COPD Gold    SOCIAL WORK ASSESSMENT / PLAN CSW met with patient who was alert and oriented.  Patient was slow to answer questions.  Patient reports that she lives with her daughter and has been since August, 2015. Patient stated that prior to that she lived alone.  She stated that she uses a wheelchair.  Patient stated that her daughter assists her with ADL's.  Patient stated that she worries about money often.  She stated that she feels that she let everybody down.  Patient goes on to state "I should have been up and about before now."  CSW provided supportive counseling and motivational interviewing.  CSW completed the GAD-7 and PHQ-9 on patient.  Patient scored a 7 n the PQH-9 and a 5 on the GAD-7.   Assessment/plan status:  Psychosocial Support/Ongoing Assessment of Needs Other assessment/ plan:   Information/referral to community resources:    PATIENT'S/FAMILY'S RESPONSE TO PLAN OF CARE: Patient feels hopeless about her loss of ability to care for herself due to her illness. Patient denies that she has SI/HI throughts.    Ambrose Pancoast, [place]

## 2014-09-05 NOTE — Clinical Social Work Note (Signed)
Pt evaluated by PT and recommendation is to return to SNF. Pt states she d/c from [facility] last Monday. She said she didn't feel ready to go home, but felt rushed out. She agrees she needs to return to SNF and would prefer to go back to [facility]. CSW discussed possibly applying for Medicaid as pt is concerned about finances. Pt plans to discuss SNF with family.  Benay Pike, [place]

## 2014-09-05 NOTE — Clinical Social Work Placement (Signed)
Clinical Social Work Department CLINICAL SOCIAL WORK PLACEMENT NOTE 09/05/2014  Patient:  Brittney Thomas, Brittney Thomas  Account Number:  [id number] Admit date:  09/04/2014  Clinical Social Worker:  Benay Pike, LCSW  Date/time:  09/05/2014 02:18 PM  Clinical Social Work is seeking post-discharge placement for this patient at the following level of care:   [place]   (*CSW will update this form in Epic as items are completed)   09/05/2014  Patient/family provided with [place] Department of Clinical Social Work's list of facilities offering this level of care within the geographic area requested by the patient (or if unable, by the patient's family).  09/05/2014  Patient/family informed of their freedom to choose among providers that offer the needed level of care, that participate in Medicare, Medicaid or managed care program needed by the patient, have an available bed and are willing to accept the patient.  09/05/2014  Patient/family informed of [facility]' ownership interest in [facility], as well as of the fact that they are under no obligation to receive care at this facility.  PASARR submitted to EDS on  PASARR number received on   FL2 transmitted to all facilities in geographic area requested by pt/family on  09/05/2014 FL2 transmitted to all facilities within larger geographic area on   Patient informed that his/her managed care company has contracts with or will negotiate with  certain facilities, including the following:     Patient/family informed of bed offers received:   Patient chooses bed at  Physician recommends and patient chooses bed at    Patient to be transferred to  on   Patient to be transferred to facility by  Patient and family notified of transfer on  Name of family member notified:    The following physician request were entered in Epic:   Additional Comments: Pt has existing pasarr.  Benay Pike, [place]

## 2014-09-05 NOTE — Care Management Note (Addendum)
    Page 1 of 1   09/06/2014     10:29:40 AM CARE MANAGEMENT NOTE 09/06/2014  Patient:  Brittney Thomas, Brittney Thomas   Account Number:  [id number]  Date Initiated:  09/05/2014  Documentation initiated by:  Theophilus Kinds  Subjective/Objective Assessment:   Pt admitted from home with COPD exacerbation. Pt lives with her daughter and was just discharged home from rehab. Pt has a walker and home O2 in place. Pt is active with [facility] RN, PT, oT and aide.     Action/Plan:   PT is recommending SNF. CSW is aware and will start bed search. Will continue to follow in case pt returns home at discharge.   Anticipated DC Date:  09/07/2014   Anticipated DC Plan:  SKILLED NURSING FACILITY  In-house referral  Clinical Social Worker      DC Planning Services  CM consult      Choice offered to / List presented to:             Status of service:  Completed, signed off Medicare Important Message given?  NA - LOS <3 / Initial given by admissions (If response is "NO", the following Medicare IM given date fields will be blank) Date Medicare IM given:   Medicare IM given by:   Date Additional Medicare IM given:   Additional Medicare IM given by:    Discharge Disposition:  [place]  Per UR Regulation:    If discussed at Long Length of Stay Meetings, dates discussed:    Comments:  09/06/14 [place], RN BSN CM Pt discharged to [facility] today. CSW to arrange discharge to facility.  09/05/14 [place], RN BSN CM

## 2014-09-05 NOTE — Evaluation (Signed)
Physical Therapy Evaluation Patient Details Name: Brittney Thomas MRN: [id number] DOB: 1933/05/27 Today's Date: 09/05/2014   History of Present Illness  PI: Brittney Thomas is a 81 y.o. female with a history of adenocarcinoma of the lung-diagnosed October 2015-started on oral chemotherapy, chronic atrial fibrillation-previously on Coumadin, and history of pulmonary embolism. Her history is also significant for a hospitalization in November 2015 for hemorrhagic shock in the setting of Coumadin therapy-she experienced a retroperitoneal bleed with a rectus sheath hematoma. She was also recently hospitalized in January 2016 for a urinary tract infection. She was discharged to [facility]. Approximately 1 week ago, she returned home. She reports that she has had generalized weakness since she's been home and thought that she was still too weak to leave the rehabilitation center. She denies focal weakness. She denies difficulty speaking or difficulty swallowing. She has had a cough with yellow sputum over the past couple days. She wears oxygen 2-3 L/m as prescribed previously. She has had some wheezing, but no worsening shortness of breath than usual. She denies chest pain or pleurisy. She denies fever or chills. She denies abdominal pain, pain with urination, or lower extremity swelling. She denies black tarry stools or bright red blood per rectum.  Clinical Impression  Pt was seen for evaluation.  She was very pleasant and cooperative but slow to answer questions.  She reported general malaise and extreme fatigue and had difficulty with word finding.  Pt states that she made poor progress at SNF but was able to ambulate short distances with a walker.  She was at home for a week prior to this admission where she was alone all day while daughter was away at school.  She was found to be profoundly deconditioned and has significant lack of right knee flexion (only to 40 degrees).  She was  unable to mobilize out of bed.  I strongly recommend SNF at d/c.    Follow Up Recommendations SNF    Equipment Recommendations  None recommended by PT    Recommendations for Other Services   OT    Precautions / Restrictions Precautions Precautions: Fall Restrictions Weight Bearing Restrictions: No      Mobility  Bed Mobility               General bed mobility comments: pt too weak and tired to particitpate  Transfers                    Ambulation/Gait                Stairs            Wheelchair Mobility    Modified Rankin (Stroke Patients Only)       Balance                                             Pertinent Vitals/Pain Pain Assessment: No/denies pain    Home Living Family/patient expects to be discharged to:: Skilled nursing facility                      Prior Function Level of Independence: Needs assistance   Gait / Transfers Assistance Needed: minimally ambulatory with a walker  ADL's / Homemaking Assistance Needed: assist needed for bathing and dressing        Hand Dominance  Extremity/Trunk Assessment               Lower Extremity Assessment: Generalized weakness;RLE deficits/detail RLE Deficits / Details: knee flexion limited to 40 degrees, AA       Communication   Communication: No difficulties  Cognition Arousal/Alertness: Awake/alert Behavior During Therapy: Flat affect Overall Cognitive Status: Difficult to assess                      General Comments      Exercises General Exercises - Lower Extremity Ankle Circles/Pumps: AROM;Both;10 reps;Supine Quad Sets: AROM;Both;10 reps;Supine Gluteal Sets: AROM;Both;10 reps;Supine Short Arc Quad: AROM;Both;10 reps;Supine Heel Slides: AAROM;Both;10 reps;Supine Hip ABduction/ADduction: AAROM;Both;10 reps;Supine      Assessment/Plan    PT Assessment Patient needs continued PT services  PT Diagnosis  Generalized weakness   PT Problem List Decreased strength;Decreased range of motion;Decreased activity tolerance;Decreased mobility;Obesity  PT Treatment Interventions Functional mobility training;Therapeutic exercise;Gait training   PT Goals (Current goals can be found in the Care Plan section) Acute Rehab PT Goals Patient Stated Goal: none stated PT Goal Formulation: With patient Time For Goal Achievement: 09/05/14 Potential to Achieve Goals: Fair    Frequency Min 3X/week   Barriers to discharge Decreased caregiver support daughter not at home during the day    Co-evaluation               End of Session Equipment Utilized During Treatment: Oxygen Activity Tolerance: Patient limited by fatigue Patient left: in bed;with call bell/phone within reach;with bed alarm set           Time: 6834-1962 PT Time Calculation (min) (ACUTE ONLY): 35 min   Charges:   PT Evaluation $Initial PT Evaluation Tier I: 1 Procedure     PT G CodesOwens Shark, Odai Wimmer L 09/05/2014, 1:50 PM

## 2014-09-05 NOTE — Progress Notes (Signed)
[facility] PROGRESS NOTE  Brittney Thomas MRN:[id number] DOB: 10/12/1932 DOA: 09/04/2014 PCP: ALEXANDER, ANNE D, MD  Assessment/Plan: 1. Generalized weakness, likely multifactorial including deconditioning, dehydration, COPD exacerbation, and chronic illnesses including lung cancer, moderately severe pulmonary hypertension, and chronic respiratory failure. Await PT for further evaluation and management. She may need rehabilitation again at the time of discharge. 2. Oxygen-dependent COPD with exacerbation. Mild.  We'll continue Solu-Medrol and Levaquin day #2. Continue duo nebulizers and Tessalon Perles for cough. 3. Acute on chronic respiratory failure with hypoxia related to COPD exacerbation. Very close to baseline.  Will continue oxygen titrated to keep her oxygen saturation is at 90% or above. 4. Acute kidney injury. Creatinine trending down.  Several days ago, her creatinine was 1.1 and it was 0.9 in 08/2014. Will continue very gentle IV fluids and follow her renal function. 5. Elevated troponin I. She has no complaints of chest pain. Elevation is marginal. Troponin 0.03 x2.  6. Metastatic Adenocarcinoma of the lung, diagnosed October 2015. The patient is under the care of Dr. Mohammed. She is being treated with oral chemotherapeutic agent, Iressa-this will be continued. 7. Macrocytic anemia and thrombocytopenia. Both are likely the consequence of chemotherapy. We'll continue to monitor. TSH within limits of normal. Vitamin B12 1836. 8. Question aphasia: appears to be word searching and very slow to respond. Otherwise neuro exam benign. Will obtain MRI brain. 9. Elevated transaminase: alk phos and AST mildly elevated and trending downward. May be related to chemo. Total bili within the limits of normal.  OP follow up  Code Status: full Family Communication: none present Disposition Plan: likely need snf.    Consultants:  none  Procedures:  none  Antibiotics:  levaquin  09/04/14>>  HPI/Subjective: Awake. Denies pain/discomfort. Speech clear but word come slowly. Need lots of prompting  Objective: Filed Vitals:   09/05/14 0638  BP: 127/92  Pulse: 82  Temp: 98.2 F (36.8 C)  Resp: 20    Intake/Output Summary (Last 24 hours) at 09/05/14 0946 Last data filed at 09/05/14 0607  Gross per 24 hour  Intake 2220.25 ml  Output      0 ml  Net 2220.25 ml   Filed Weights   09/04/14 1102 09/05/14 0647  Weight: 83.054 kg (183 lb 1.6 oz) 85.639 kg (188 lb 12.8 oz)    Exam:   General:  Somewhat frail appearing, calm cooperative  Cardiovascular: irregularly irregular no LE edema  Respiratory: normal effort  BS with good air flow and mild expiratory wheeze throughtout  Abdomen: soft non-tender to palpation. +BS   Musculoskeletal: no clubbing or cyanosis   Data Reviewed: Basic Metabolic Panel:  Recent Labs Lab 09/01/14 1040 09/04/14 0644 09/05/14 0012  NA 137 136 137  K 4.7 4.5 4.5  CL  --  90* 93*  CO2 36* 39* 34*  GLUCOSE 114 96 126*  BUN 30.2* 42* 40*  CREATININE 1.1 1.36* 1.24*  CALCIUM 9.2 9.5 9.1   Liver Function Tests:  Recent Labs Lab 09/01/14 1040 09/04/14 0644  AST 38* 39*  ALT 12 19  ALKPHOS 154* 135*  BILITOT 1.04 1.0  PROT 6.6 6.8  ALBUMIN 2.7* 2.9*   No results for input(s): LIPASE, AMYLASE in the last 168 hours. No results for input(s): AMMONIA in the last 168 hours. CBC:  Recent Labs Lab 09/01/14 1040 09/04/14 0644  WBC 5.4 5.3  NEUTROABS 4.2 4.0  HGB 11.2* 11.3*  HCT 36.4 35.5*  MCV 110.3* 107.6*  PLT 161 122*     Cardiac Enzymes:  Recent Labs Lab 09/04/14 0644 09/04/14 1150 09/04/14 1721 09/05/14 0012  TROPONINI 0.04* 0.04* 0.03 0.03   BNP (last 3 results)  Recent Labs  06/26/14 2353  BNP 620.7*    ProBNP (last 3 results) No results for input(s): PROBNP in the last 8760 hours.  CBG: No results for input(s): GLUCAP in the last 168 hours.  Recent Results (from the past 240 hour(s))   MRSA PCR Screening     Status: None   Collection Time: 09/04/14 11:20 AM  Result Value Ref Range Status   MRSA by PCR NEGATIVE NEGATIVE Final    Comment:        The GeneXpert MRSA Assay (FDA approved for NASAL specimens only), is one component of a comprehensive MRSA colonization surveillance program. It is not intended to diagnose MRSA infection nor to guide or monitor treatment for MRSA infections.      Studies: Dg Chest Port 1 View  09/04/2014   CLINICAL DATA:  Cough.  Weakness.  Initial encounter.  EXAM: PORTABLE CHEST - 1 VIEW  COMPARISON:  09/01/2014 CT.  FINDINGS: Cardiomegaly is present. Pulmonary vascular congestion. No airspace consolidation is identified. The pulmonary nodule on prior chest CT is not evident, likely due to bony overlap at the LEFT apex. Monitoring leads project over the chest. Aortic arch atherosclerosis. Old RIGHT midshaft clavicle fracture.  IMPRESSION: Cardiomegaly and pulmonary vascular congestion.   Electronically Signed   By: Geoffrey  Lamke M.D.   On: 09/04/2014 07:53    Scheduled Meds: . benzonatate  100 mg Oral TID  . calcium-vitamin D  1 tablet Oral Q breakfast  . feeding supplement (ENSURE COMPLETE)  237 mL Oral BID BM  . folic acid  1 mg Oral Daily  . gefitinib  250 mg Oral Daily  . ipratropium-albuterol  3 mL Nebulization Q6H  . levofloxacin (LEVAQUIN) IV  500 mg Intravenous Q48H  . levothyroxine  88 mcg Oral QAC breakfast  . methylPREDNISolone (SOLU-MEDROL) injection  40 mg Intravenous Q12H  . senna-docusate  1 tablet Oral Daily   Continuous Infusions: . 0.9 % NaCl with KCl 20 mEq / L 65 mL/hr at 09/05/14 0255    Principal Problem:   Generalized weakness Active Problems:   Adenocarcinoma, lung   Acute and chronic respiratory failure with hypoxia   Chronic atrial fibrillation   Moderate to severe pulmonary hypertension   Essential hypertension, benign   COPD exacerbation   Thrombocytopenia   AKI (acute kidney injury)    Macrocytic anemia   Elevated troponin I level   Elevated transaminase level    Time spent: 35 minutes    BLACK,KAREN M  [facility] Pager [phone]. If 7PM-7AM, please contact night-coverage at [email or web address], password [email or web address] 09/05/2014, 9:46 AM  LOS: 1 day              

## 2014-09-06 ENCOUNTER — Ambulatory Visit: Payer: Medicare Other | Admitting: Internal Medicine

## 2014-09-06 ENCOUNTER — Other Ambulatory Visit: Payer: Self-pay | Admitting: Physician Assistant

## 2014-09-06 DIAGNOSIS — I482 Chronic atrial fibrillation: Secondary | ICD-10-CM

## 2014-09-06 LAB — BASIC METABOLIC PANEL
ANION GAP: 6 (ref 5–15)
BUN: 40 mg/dL — AB (ref 6–23)
CO2: 39 mmol/L — ABNORMAL HIGH (ref 19–32)
Calcium: 9.3 mg/dL (ref 8.4–10.5)
Chloride: 96 mmol/L (ref 96–112)
Creatinine, Ser: 1.06 mg/dL (ref 0.50–1.10)
GFR calc Af Amer: 56 mL/min — ABNORMAL LOW (ref >90)
GFR calc non Af Amer: 49 mL/min — ABNORMAL LOW (ref >90)
GLUCOSE: 142 mg/dL — AB (ref 70–99)
POTASSIUM: 4.7 mmol/L (ref 3.5–5.1)
Sodium: 141 mmol/L (ref 135–145)

## 2014-09-06 MED ORDER — LEVOFLOXACIN 500 MG PO TABS: 500.0000 mg | ORAL_TABLET | Freq: Every day | ORAL | Status: DC

## 2014-09-06 MED ORDER — GUAIFENESIN-DM 100-10 MG/5ML PO SYRP: 5.0000 mL | ORAL_SOLUTION | ORAL | Status: DC | PRN

## 2014-09-06 MED ORDER — BENZONATATE 100 MG PO CAPS: 100.0000 mg | ORAL_CAPSULE | Freq: Three times a day (TID) | ORAL | Status: DC

## 2014-09-06 MED ORDER — PREDNISONE 10 MG PO TABS: ORAL_TABLET | ORAL | Status: DC

## 2014-09-06 MED ORDER — IPRATROPIUM-ALBUTEROL 0.5-2.5 (3) MG/3ML IN SOLN: 3.0000 mL | Freq: Four times a day (QID) | RESPIRATORY_TRACT | Status: DC | PRN

## 2014-09-06 MED ORDER — HYDROCODONE-ACETAMINOPHEN 5-325 MG PO TABS: 1.0000 | ORAL_TABLET | Freq: Four times a day (QID) | ORAL | Status: AC | PRN

## 2014-09-06 MED ORDER — ASPIRIN EC 81 MG PO TBEC: 81.0000 mg | DELAYED_RELEASE_TABLET | Freq: Every day | ORAL | Status: DC

## 2014-09-06 NOTE — Discharge Summary (Signed)
Physician Discharge Summary  Brittney Thomas EHO:[id number] DOB: December 15, 1932 DOA: 09/04/2014  PCP: Brittney Duos, MD  Admit date: 09/04/2014 Discharge date: 09/06/2014  Time spent: 40 minutes  Recommendations for Outpatient Follow-up:  1. Follow up with PCP in 1-2 weeks for evaluation of respiratory status. Recommend BMET to evaluate kidney funciton 2. Discharge to [place] and rehab  Discharge Diagnoses:  Principal Problem:   Generalized weakness Active Problems:   Adenocarcinoma, lung   Acute and chronic respiratory failure with hypoxia   Chronic atrial fibrillation   Moderate to severe pulmonary hypertension   Essential hypertension, benign   COPD exacerbation   Thrombocytopenia   AKI (acute kidney injury)   Macrocytic anemia   Elevated troponin I level   Elevated transaminase level   History of pulmonary embolism   Discharge Condition: stable  Diet recommendation: heart healthy  Filed Weights   09/04/14 1102 09/05/14 0647 09/06/14 0554  Weight: 83.054 kg (183 lb 1.6 oz) 85.639 kg (188 lb 12.8 oz) 85.957 kg (189 lb 8 oz)    History of present illness:  Brittney Thomas is a 81 y.o. female with a history of adenocarcinoma of the lung-diagnosed October 2015-started on oral chemotherapy, chronic atrial fibrillation-previously on Coumadin, and history of pulmonary embolism. Her history is also significant for a hospitalization in November 2015 for hemorrhagic shock in the setting of Coumadin therapy-she experienced a retroperitoneal bleed with a rectus sheath hematoma. She was also recently hospitalized in January 2016 for a urinary tract infection. She was discharged to [facility]. Approximately 1 week ago, she returned home. She reported that she had generalized weakness since she'd been home and thought that she was still too weak to leave the rehabilitation center. She denied focal weakness. She denied difficulty speaking or difficulty  swallowing. She had a cough with yellow sputum over the past couple days. She wears oxygen 2-3 L/m as prescribed previously. She had some wheezing, but no worsening shortness of breath than usual. She denied chest pain or pleurisy. She denied fever or chills. She denied abdominal pain, pain with urination, or lower extremity swelling. She denied black tarry stools or bright red blood per rectum.  In the emergency department, she was afebrile and hemodynamically stable. Her oxygen saturation fell to 86% on 3 L of nasal cannula oxygen, but this was repeated and her oxygen saturation improved to 90%. Her EKG revealed atrial fibrillation with a heart rate of 77 bpm. Her chest x-ray revealed cardiomegaly and pulmonary vascular congestion. Her urinalysis was unremarkable. Her lab data were significant for BUN of 42, creatinine 1.36, AST of 39, troponin I 0.04, hemoglobin 11.3, MCV of 107.6, and platelet count of 122.   Hospital Course:  1. Generalized weakness, likely multifactorial including deconditioning, dehydration, COPD exacerbation, and chronic illnesses including lung cancer, moderately severe pulmonary hypertension, and chronic respiratory failure. Evaluated by  PT who recommended snf. At discharge much more alert and engaging. Agrees with discharge to snf for strength and endurance 2. Oxygen-dependent COPD with exacerbation. Mild.Provided with  Solu-Medrol and Levaquin as well as duo nebulizers and Tessalon Perles for cough. At discharge close to baseline. Will discharge with 2 more days levaquin to complete 5 days course as well as quick prednisone taper.  3. Acute on chronic respiratory failure with hypoxia related to COPD exacerbation. At baseline at discharge 4. Acute kidney injury. Resolved at discharge.  5. Elevated troponin I. She has no complaints of chest pain. Elevation is marginal. Troponin  0.03 x2.  6. Metastatic Adenocarcinoma of the lung, diagnosed October 2015. The patient is under the  care of Brittney. Earlie Thomas. She is being treated with oral chemotherapeutic agent, Iressa-this was continued. 7. Macrocytic anemia and thrombocytopenia. Both are likely the consequence of chemotherapy. We'll continue to monitor. TSH within limits of normal. Vitamin B12 1836. 8. Question aphasia: appeared to be word searching. MRI brain was nonacute. Neuro exam benign.  9. Elevated transaminase: alk phos and AST mildly elevated and trending downward. May be related to chemo. Total bili within the limits of normal. OP follow up 10. History of afib/PE/DVT ; was on coumadin until 12/15 due to hx retroperitoneal bleeding. Has not been restarted. Chart review indicates not candidate for resuming per pulmonology/critical care note and facility provider. Discussed with  Heme/onc who deferred decision to cardiology. Recommend follow up with Brittney Thomas 1 week for evaluation. Will discharge on 22m asa  Procedures:  none  Consultations:  none  Discharge Exam: Filed Vitals:   09/06/14 0554  BP: 121/62  Pulse: 66  Temp: 97.2 F (36.2 C)  Resp: 20    General: appears frail but comfortable Cardiovascular: irregularly irregular trace LE edema Respiratory: normal effort BS with some coarseness no wheeze  Discharge Instructions    Current Discharge Medication List    START taking these medications   Details  aspirin EC 81 MG tablet Take 1 tablet (81 mg total) by mouth daily.    benzonatate (TESSALON) 100 MG capsule Take 1 capsule (100 mg total) by mouth 3 (three) times daily. Qty: 20 capsule, Refills: 0    guaiFENesin-dextromethorphan (ROBITUSSIN DM) 100-10 MG/5ML syrup Take 5 mLs by mouth every 4 (four) hours as needed for cough. Qty: 118 mL, Refills: 0    HYDROcodone-acetaminophen (NORCO/VICODIN) 5-325 MG per tablet Take 1 tablet by mouth every 6 (six) hours as needed for moderate pain. Qty: 30 tablet, Refills: 0    ipratropium-albuterol (DUONEB) 0.5-2.5 (3) MG/3ML SOLN Take 3 mLs by  nebulization every 6 (six) hours as needed. Qty: 360 mL    levofloxacin (LEVAQUIN) 500 MG tablet Take 1 tablet (500 mg total) by mouth daily. Qty: 2 tablet, Refills: 0    predniSONE (DELTASONE) 10 MG tablet Take 4 tabs on 09/06/14 then take 3 tabs 09/07/14 then take 2 tabs 09/08/14 then take 1 tab 09/09/14 then stop. Qty: 10 tablet      CONTINUE these medications which have NOT CHANGED   Details  acetaminophen (TYLENOL) 500 MG tablet Take 500 mg by mouth every 6 (six) hours as needed (pain).    calcium-vitamin D (OSCAL WITH D) 500-200 MG-UNIT per tablet Take 1 tablet by mouth daily with breakfast.     feeding supplement, ENSURE COMPLETE, (ENSURE COMPLETE) LIQD Take 237 mLs by mouth 2 (two) times daily between meals.    folic acid (FOLVITE) 1 MG tablet Take 1 tablet (1 mg total) by mouth daily. Qty: 30 tablet, Refills: 3    gefitinib (IRESSA) 250 MG tablet Take 1 tablet (250 mg total) by mouth daily. Qty: 30 tablet, Refills: 2    levothyroxine (SYNTHROID, LEVOTHROID) 88 MCG tablet Take 88 mcg by mouth daily before breakfast.    Omega-3 Fatty Acids (SALMON OIL-1000 PO) Take 1 tablet by mouth daily.     temazepam (RESTORIL) 7.5 MG capsule Take 7.5 mg by mouth at bedtime as needed for sleep.    metoprolol tartrate (LOPRESSOR) 25 MG tablet Take 0.5 tablets (12.5 mg total) by mouth 2 (two) times daily. Qty:  60 tablet, Refills: 0      STOP taking these medications     sennosides-docusate sodium (SENOKOT-S) 8.6-50 MG tablet        Allergies  Allergen Reactions  . Poison Ivy Extract [Extract Of Poison Ivy] Rash  . Sulfa Antibiotics Rash  . Sulfacetamide Sodium Rash   Follow-up Information    Follow up with Brittney Duos, MD. Schedule an appointment as soon as possible for a visit in 1 week.   Specialty:  Internal Medicine   Contact information:   [place] [id number] [phone]        The results of significant diagnostics from this hospitalization  (including imaging, microbiology, ancillary and laboratory) are listed below for reference.    Significant Diagnostic Studies: Ct Abdomen Pelvis Wo Contrast  09/01/2014   CLINICAL DATA:  Lung cancer. Metastatic non-small-cell. Restaging. COPD.  EXAM: CT CHEST, ABDOMEN AND PELVIS WITHOUT CONTRAST  TECHNIQUE: Multidetector CT imaging of the chest, abdomen and pelvis was performed following the standard protocol without IV contrast.  COMPARISON:  Abdominal pelvic CT of 05/28/2014. Chest radiograph of 06/26/2014. Chest CT of 04/27/2014. PET of 03/18/2014.  FINDINGS: CT CHEST FINDINGS  Mediastinum/Nodes: Low left cervical soft tissue fullness is similar and favored to arise from the left lobe of the thyroid. Example image 5.  Tortuous thoracic aorta with atherosclerosis within. Moderate to marked cardiomegaly. Small pericardial effusion is new. Pulmonary artery enlargement, outflow tract 3.4 cm. No mediastinal or definite hilar adenopathy, given limitations of unenhanced CT. Mildly dilated esophagus.  Lungs/Pleura: Trace right-sided pleural fluid.  Mild centrilobular emphysema with mosaic attenuation, likely related to air trapping.  Probable volume loss in the anterior right upper lobe on image 14.  Decreased in left upper lobe pulmonary nodule. 1.8 x 0.9 cm on image 12 versus 2.4 x 1.9 cm on the prior exam.  Musculoskeletal: Left glenoid lytic lesion measures 1.5 cm today versus 1.1 cm on the prior.  CT ABDOMEN PELVIS FINDINGS  Hepatobiliary: Moderate cirrhosis. New or increased hepatic steatosis. Combination of lack of IV contrast and development of steatosis makes evaluation for hepatic metastasis extremely limited. Vague hypo attenuating hepatic dome focus of 1.3 cm is decreased from 4.3 cm on the prior.  A probable cyst in the subcapsular right hepatic lobe is similar on image 52. Left hepatic lobe cysts are also identified. Cholelithiasis without biliary ductal dilatation.  Mild motion degradation throughout.   Pancreas: Grossly normal.  Spleen: Normal  Adrenals/Urinary Tract: Normal adrenal glands. Interpolar left renal low-density lesion which is likely a cyst. Bilateral renal cortical thinning. No hydronephrosis. Urinary bladder is compressed and poorly evaluated.  Stomach/Bowel: Normal stomach, without wall thickening. Normal caliber of large and small bowel loops.  Vascular/Lymphatic: Normal caliber of the aorta and branch vessels. Increased number of small retroperitoneal nodes. None are pathologic by size criteria.  Multiple small left inguinal nodes are similar.  Reproductive: Presumed hysterectomy.  No gross adnexal mass.  Other: Small volume abdominal pelvic ascites is similar. A left retroperitoneal loculated fluid collection represents evolution of the residua from a left rectus sheath hematoma. 6.4 x 10.3 cm on image 98, decreased from 13.1 x 7.3 cm at the same level on the prior exam. No new hematoma identified.  Anasarca. Prominent cylindrical soft tissue structures in the pelvis are felt to represent collateral veins. Example image 103. These may relate to portal venous hypertension. Suboptimally evaluated on this unenhanced study. New or increased since the prior.  Musculoskeletal: Osteopenia.  Increased sclerosis in the right iliac wing at the site of a lytic lesion on the prior.  IMPRESSION: CT CHEST IMPRESSION  1. Response to therapy of left upper lobe pulmonary nodule. 2. Although thoracic nodes are suboptimally evaluated secondary lack of IV contrast, no residual adenopathy is seen. 3. A left glenoid lytic lesion has enlarged, suggesting progressive osseous metastasis. 4. Cardiomegaly with new trace pericardial fluid. 5. Pulmonary artery enlargement suggests pulmonary arterial hypertension. 6. Trace right pleural fluid.  CT ABDOMEN AND PELVIS IMPRESSION  1. Motion degradation. 2. Development of hepatic steatosis. This further degrades evaluation for liver lesions. Suspect response to therapy of liver  metastasis. 3. Cirrhosis. Similar small volume ascites. Soft tissue densities in the pelvis are favored to represent collateral veins and may represent portal venous hypertension. Suboptimally evaluated. 4. Decreased  left-sided extraperitoneal hematoma. 5. Increased sclerosis, likely indicative of healing of right iliac osseous metastasis.   Electronically Signed   By: Abigail Miyamoto M.D.   On: 09/01/2014 11:22   Ct Chest Wo Contrast  09/01/2014   CLINICAL DATA:  Lung cancer. Metastatic non-small-cell. Restaging. COPD.  EXAM: CT CHEST, ABDOMEN AND PELVIS WITHOUT CONTRAST  TECHNIQUE: Multidetector CT imaging of the chest, abdomen and pelvis was performed following the standard protocol without IV contrast.  COMPARISON:  Abdominal pelvic CT of 05/28/2014. Chest radiograph of 06/26/2014. Chest CT of 04/27/2014. PET of 03/18/2014.  FINDINGS: CT CHEST FINDINGS  Mediastinum/Nodes: Low left cervical soft tissue fullness is similar and favored to arise from the left lobe of the thyroid. Example image 5.  Tortuous thoracic aorta with atherosclerosis within. Moderate to marked cardiomegaly. Small pericardial effusion is new. Pulmonary artery enlargement, outflow tract 3.4 cm. No mediastinal or definite hilar adenopathy, given limitations of unenhanced CT. Mildly dilated esophagus.  Lungs/Pleura: Trace right-sided pleural fluid.  Mild centrilobular emphysema with mosaic attenuation, likely related to air trapping.  Probable volume loss in the anterior right upper lobe on image 14.  Decreased in left upper lobe pulmonary nodule. 1.8 x 0.9 cm on image 12 versus 2.4 x 1.9 cm on the prior exam.  Musculoskeletal: Left glenoid lytic lesion measures 1.5 cm today versus 1.1 cm on the prior.  CT ABDOMEN PELVIS FINDINGS  Hepatobiliary: Moderate cirrhosis. New or increased hepatic steatosis. Combination of lack of IV contrast and development of steatosis makes evaluation for hepatic metastasis extremely limited. Vague hypo attenuating  hepatic dome focus of 1.3 cm is decreased from 4.3 cm on the prior.  A probable cyst in the subcapsular right hepatic lobe is similar on image 52. Left hepatic lobe cysts are also identified. Cholelithiasis without biliary ductal dilatation.  Mild motion degradation throughout.  Pancreas: Grossly normal.  Spleen: Normal  Adrenals/Urinary Tract: Normal adrenal glands. Interpolar left renal low-density lesion which is likely a cyst. Bilateral renal cortical thinning. No hydronephrosis. Urinary bladder is compressed and poorly evaluated.  Stomach/Bowel: Normal stomach, without wall thickening. Normal caliber of large and small bowel loops.  Vascular/Lymphatic: Normal caliber of the aorta and branch vessels. Increased number of small retroperitoneal nodes. None are pathologic by size criteria.  Multiple small left inguinal nodes are similar.  Reproductive: Presumed hysterectomy.  No gross adnexal mass.  Other: Small volume abdominal pelvic ascites is similar. A left retroperitoneal loculated fluid collection represents evolution of the residua from a left rectus sheath hematoma. 6.4 x 10.3 cm on image 98, decreased from 13.1 x 7.3 cm at the same level on the prior exam. No new hematoma identified.  Anasarca.  Prominent cylindrical soft tissue structures in the pelvis are felt to represent collateral veins. Example image 103. These may relate to portal venous hypertension. Suboptimally evaluated on this unenhanced study. New or increased since the prior.  Musculoskeletal: Osteopenia. Increased sclerosis in the right iliac wing at the site of a lytic lesion on the prior.  IMPRESSION: CT CHEST IMPRESSION  1. Response to therapy of left upper lobe pulmonary nodule. 2. Although thoracic nodes are suboptimally evaluated secondary lack of IV contrast, no residual adenopathy is seen. 3. A left glenoid lytic lesion has enlarged, suggesting progressive osseous metastasis. 4. Cardiomegaly with new trace pericardial fluid. 5.  Pulmonary artery enlargement suggests pulmonary arterial hypertension. 6. Trace right pleural fluid.  CT ABDOMEN AND PELVIS IMPRESSION  1. Motion degradation. 2. Development of hepatic steatosis. This further degrades evaluation for liver lesions. Suspect response to therapy of liver metastasis. 3. Cirrhosis. Similar small volume ascites. Soft tissue densities in the pelvis are favored to represent collateral veins and may represent portal venous hypertension. Suboptimally evaluated. 4. Decreased  left-sided extraperitoneal hematoma. 5. Increased sclerosis, likely indicative of healing of right iliac osseous metastasis.   Electronically Signed   By: Abigail Miyamoto M.D.   On: 09/01/2014 11:22   Mr Brain Wo Contrast  09/05/2014   CLINICAL DATA:  Altered mental status and slurred speech of 2 days duration. Personal history of lung cancer.  EXAM: MRI HEAD WITHOUT CONTRAST  TECHNIQUE: Multiplanar, multiecho pulse sequences of the brain and surrounding structures were obtained without intravenous contrast.  COMPARISON:  05/20/2014  FINDINGS: Diffusion imaging does not show any acute or subacute infarction. The brainstem and cerebellum are normal. The cerebral hemispheres are normal, showing only age related volume loss but without small-vessel disease or old large vessel infarction. No mass lesion, hemorrhage, hydrocephalus or extra-axial collection. No pituitary mass. No fluid in the sinuses, middle ears or mastoids.  IMPRESSION: Normal study for age. Ordinary age related volume loss without evidence of stroke or mass lesion.   Electronically Signed   By: Nelson Chimes M.D.   On: 09/05/2014 10:39   Dg Chest Port 1 View  09/04/2014   CLINICAL DATA:  Cough.  Weakness.  Initial encounter.  EXAM: PORTABLE CHEST - 1 VIEW  COMPARISON:  09/01/2014 CT.  FINDINGS: Cardiomegaly is present. Pulmonary vascular congestion. No airspace consolidation is identified. The pulmonary nodule on prior chest CT is not evident, likely due to  bony overlap at the LEFT apex. Monitoring leads project over the chest. Aortic arch atherosclerosis. Old RIGHT midshaft clavicle fracture.  IMPRESSION: Cardiomegaly and pulmonary vascular congestion.   Electronically Signed   By: Dereck Ligas M.D.   On: 09/04/2014 07:53    Microbiology: Recent Results (from the past 240 hour(s))  MRSA PCR Screening     Status: None   Collection Time: 09/04/14 11:20 AM  Result Value Ref Range Status   MRSA by PCR NEGATIVE NEGATIVE Final    Comment:        The GeneXpert MRSA Assay (FDA approved for NASAL specimens only), is one component of a comprehensive MRSA colonization surveillance program. It is not intended to diagnose MRSA infection nor to guide or monitor treatment for MRSA infections.      Labs: Basic Metabolic Panel:  Recent Labs Lab 09/01/14 1040 09/04/14 0644 09/05/14 0012 09/06/14 0704  NA 137 136 137 141  K 4.7 4.5 4.5 4.7  CL  --  90* 93* 96  CO2 36* 39* 34* 39*  GLUCOSE 114  96 126* 142*  BUN 30.2* 42* 40* 40*  CREATININE 1.1 1.36* 1.24* 1.06  CALCIUM 9.2 9.5 9.1 9.3   Liver Function Tests:  Recent Labs Lab 09/01/14 1040 09/04/14 0644  AST 38* 39*  ALT 12 19  ALKPHOS 154* 135*  BILITOT 1.04 1.0  PROT 6.6 6.8  ALBUMIN 2.7* 2.9*   No results for input(s): LIPASE, AMYLASE in the last 168 hours. No results for input(s): AMMONIA in the last 168 hours. CBC:  Recent Labs Lab 09/01/14 1040 09/04/14 0644  WBC 5.4 5.3  NEUTROABS 4.2 4.0  HGB 11.2* 11.3*  HCT 36.4 35.5*  MCV 110.3* 107.6*  PLT 161 122*   Cardiac Enzymes:  Recent Labs Lab 09/04/14 0644 09/04/14 1150 09/04/14 1721 09/05/14 0012  TROPONINI 0.04* 0.04* 0.03 0.03   BNP: BNP (last 3 results)  Recent Labs  06/26/14 2353  BNP 620.7*    ProBNP (last 3 results) No results for input(s): PROBNP in the last 8760 hours.  CBG: No results for input(s): GLUCAP in the last 168 hours.     SignedRadene Gunning  [facility] 09/06/2014, 11:52 AM

## 2014-09-06 NOTE — Clinical Social Work Placement (Signed)
Clinical Social Work Department CLINICAL SOCIAL WORK PLACEMENT NOTE 09/06/2014  Patient:  Brittney Thomas, Brittney Thomas  Account Number:  [id number] Admit date:  09/04/2014  Clinical Social Worker:  Benay Pike, LCSW  Date/time:  09/05/2014 02:18 PM  Clinical Social Work is seeking post-discharge placement for this patient at the following level of care:   [place]   (*CSW will update this form in Epic as items are completed)   09/05/2014  Patient/family provided with [facility] Department of Clinical Social Work's list of facilities offering this level of care within the geographic area requested by the patient (or if unable, by the patient's family).  09/05/2014  Patient/family informed of their freedom to choose among providers that offer the needed level of care, that participate in Medicare, Medicaid or managed care program needed by the patient, have an available bed and are willing to accept the patient.  09/05/2014  Patient/family informed of [facility]' ownership interest in [facility], as well as of the fact that they are under no obligation to receive care at this facility.  PASARR submitted to EDS on  PASARR number received on   FL2 transmitted to all facilities in geographic area requested by pt/family on  09/05/2014 FL2 transmitted to all facilities within larger geographic area on   Patient informed that his/her managed care company has contracts with or will negotiate with  certain facilities, including the following:     Patient/family informed of bed offers received:  09/06/2014 Patient chooses bed at [facility] Physician recommends and patient chooses bed at    Patient to be transferred to [place] on  09/06/2014 Patient to be transferred to facility by [name] Patient and family notified of transfer on 09/06/2014 Name of family member notified:  [name]- daughter  The following physician request were entered in Epic:   Additional  Comments: Pt has existing pasarr.  Benay Pike, [place]

## 2014-09-06 NOTE — Evaluation (Signed)
Occupational Therapy Evaluation Patient Details Name: Brittney Thomas MRN: [id number] DOB: 09/13/1932 Today's Date: 09/06/2014    History of Present Illness PI: KENNIS BUELL is a 81 y.o. female with a history of adenocarcinoma of the lung-diagnosed October 2015-started on oral chemotherapy, chronic atrial fibrillation-previously on Coumadin, and history of pulmonary embolism. Her history is also significant for a hospitalization in November 2015 for hemorrhagic shock in the setting of Coumadin therapy-she experienced a retroperitoneal bleed with a rectus sheath hematoma. She was also recently hospitalized in January 2016 for a urinary tract infection. She was discharged to [facility]. Approximately 1 week ago, she returned home. She reports that she has had generalized weakness since she's been home and thought that she was still too weak to leave the rehabilitation center. She denies focal weakness. She denies difficulty speaking or difficulty swallowing. She has had a cough with yellow sputum over the past couple days. She wears oxygen 2-3 L/m as prescribed previously. She has had some wheezing, but no worsening shortness of breath than usual. She denies chest pain or pleurisy. She denies fever or chills. She denies abdominal pain, pain with urination, or lower extremity swelling. She denies black tarry stools or bright red blood per rectum.   Clinical Impression   PTA pt lived with daughter after a fall in August. Pt reports she was at [facility] until 08/29/14, when she was discharged due to billing issues. Pt was eating upon OTR arrival in room and declined to move to EOB or chair, agreed to evaluation procedures. Pt states she needs to go back to rehab to continue her therapy. Pt requires assistance with dressing, bathing, and grooming tasks; reports daughter provides assistance as needed at home. Pt reports independence prior to her fall in  August. Pt demonstrates BUE weakness (3+/5), full range of motion. Recommend SNF on d/c.     Follow Up Recommendations  SNF    Equipment Recommendations  None recommended by OT       Precautions / Restrictions Precautions Precautions: Fall Restrictions Weight Bearing Restrictions: No      Mobility Bed Mobility               General bed mobility comments: pt eating and declined to sit on EOB or move into chair  Transfers                           ADL Overall ADL's : Needs assistance/impaired Eating/Feeding: Modified independent                                     General ADL Comments: Pt reports daughter assists with lower body dressing tasks. Patient takes sponge bath due to being unable to get into tub at daughter's home. Pt requires assistance with grooming tasks at time due to fatigue. Pt's daughter and son-in-law complete meal preparation tasks.      Vision Vision Assessment?: No apparent visual deficits          Pertinent Vitals/Pain Pain Assessment: No/denies pain     Hand Dominance Right   Extremity/Trunk Assessment Upper Extremity Assessment Upper Extremity Assessment: Generalized weakness   Lower Extremity Assessment Lower Extremity Assessment: Defer to PT evaluation       Communication Communication Communication: No difficulties   Cognition Arousal/Alertness: Awake/alert Behavior During Therapy: WFL for tasks assessed/performed Overall  Cognitive Status: Within Functional Limits for tasks assessed                                Home Living Family/patient expects to be discharged to:: Skilled nursing facility                                        Prior Functioning/Environment Level of Independence: Needs assistance    ADL's / Homemaking Assistance Needed: needs assistance for dressing tasks and some bathing        OT Diagnosis: Generalized weakness   OT Problem List:  Decreased strength;Decreased activity tolerance;Decreased safety awareness                       End of Session    Activity Tolerance:   Patient left: in bed;with call bell/phone within reach   Time: 6979-4801 OT Time Calculation (min): 14 min Charges:  OT General Charges $OT Visit: 1 Procedure OT Evaluation $Initial OT Evaluation Tier I: 1 Procedure G-Codes:    Guadelupe Sabin, OTR/L [phone]  09/06/2014, 9:35 AM

## 2014-09-06 NOTE — Clinical Social Work Note (Signed)
Pt accepts bed at [facility]. Per business office there, pt has no copay. Pt and daughter notified and are very relieved. D/C today. Pt, [name], and facility aware and agreeable. D/C summary faxed. [name] will pick up pt later this afternoon.  Benay Pike, [place]

## 2014-09-06 NOTE — Progress Notes (Signed)
Physical Therapy Treatment Patient Details Name: Brittney Thomas MRN: [id number] DOB: 1933-03-25 Today's Date: 09/06/2014    History of Present Illness PI: Brittney Thomas is a 81 y.o. female with a history of adenocarcinoma of the lung-diagnosed October 2015-started on oral chemotherapy, chronic atrial fibrillation-previously on Coumadin, and history of pulmonary embolism. Her history is also significant for a hospitalization in November 2015 for hemorrhagic shock in the setting of Coumadin therapy-she experienced a retroperitoneal bleed with a rectus sheath hematoma. She was also recently hospitalized in January 2016 for a urinary tract infection. She was discharged to [facility]. Approximately 1 week ago, she returned home. She reports that she has had generalized weakness since she's been home and thought that she was still too weak to leave the rehabilitation center. She denies focal weakness. She denies difficulty speaking or difficulty swallowing. She has had a cough with yellow sputum over the past couple days. She wears oxygen 2-3 L/m as prescribed previously. She has had some wheezing, but no worsening shortness of breath than usual. She denies chest pain or pleurisy. She denies fever or chills. She denies abdominal pain, pain with urination, or lower extremity swelling. She denies black tarry stools or bright red blood per rectum.    PT Comments    Pt was much more alert today, daughter present.  Daughter stated that she planned to have pt return to her home at d/c.  She was concerned that pt was not able to walk any distance as she had been able to do at rehab.  We discussed that fact that pt had been in a decline all of last week and has been in bed for the past 2 days.  Daughter appeared to understand this perspective.  In order to go home, she would benefit from HHPT as well as a BSC and w/c.  If pt/daughter decide on SNF, this would still be very appropriate.   Today, pt tolerated PT well with therapeutic exercise, transfer out of bed with mod assist.  She was unable to ambulate due to general deconditioning and weakness but was able to transfer to a chair with min guard assist, then able to step in place for 12 steps.  Follow Up Recommendations  Home health PT;SNF     Equipment Recommendations  Wheelchair (measurements PT);3in1 (PT)    Recommendations for Other Services  OT     Precautions / Restrictions Precautions Precautions: Fall Restrictions Weight Bearing Restrictions: No    Mobility  Bed Mobility Overal bed mobility: Needs Assistance Bed Mobility: Supine to Sit     Supine to sit: Min assist     General bed mobility comments: pt eating and declined to sit on EOB or move into chair  Transfers Overall transfer level: Needs assistance Equipment used: Rolling walker (2 wheeled) Transfers: Sit to/from [facility] Sit to Stand: Mod assist Stand pivot transfers: Min guard       General transfer comment: pt able to use a walker to pivot bed to chair...she was quite weak and unable to walk any distance so we had her stand in place and was able to take 12 steps.  Ambulation/Gait                 Stairs            Wheelchair Mobility    Modified Rankin (Stroke Patients Only)       Balance Overall balance assessment: Modified Independent  Cognition Arousal/Alertness: Awake/alert Behavior During Therapy: WFL for tasks assessed/performed Overall Cognitive Status: Within Functional Limits for tasks assessed                      Exercises General Exercises - Lower Extremity Ankle Circles/Pumps: AROM;Both;10 reps;Supine Quad Sets: AROM;Both;10 reps;Supine Gluteal Sets: AROM;Both;10 reps;Supine Short Arc Quad: AROM;Both;10 reps;Supine Heel Slides: AAROM;Both;10 reps;Supine Hip ABduction/ADduction: AAROM;Both;10 reps;Supine    General  Comments        Pertinent Vitals/Pain Pain Assessment: No/denies pain    Home Living Family/patient expects to be discharged to:: Skilled nursing facility                    Prior Function Level of Independence: Needs assistance    ADL's / Homemaking Assistance Needed: needs assistance for dressing tasks and some bathing     PT Goals (current goals can now be found in the care plan section) Progress towards PT goals: Progressing toward goals    Frequency  Min 3X/week    PT Plan Discharge plan needs to be updated;Equipment recommendations need to be updated    Co-evaluation             End of Session Equipment Utilized During Treatment: Oxygen;Gait belt Activity Tolerance: Patient limited by fatigue Patient left: in chair;with call bell/phone within reach;with nursing/sitter in room     Time: 1610-9604 PT Time Calculation (min) (ACUTE ONLY): 50 min  Charges:  $Therapeutic Exercise: 8-22 mins $Therapeutic Activity: 8-22 mins $Self Care/Home Management: 8-22                    G Codes:      Brittney Thomas 09-08-2014, 11:57 AM

## 2014-09-06 NOTE — Progress Notes (Signed)
Report called to Ginger at [facility].

## 2014-09-08 ENCOUNTER — Telehealth: Payer: Self-pay | Admitting: Internal Medicine

## 2014-09-08 NOTE — Telephone Encounter (Signed)
pt called to r/s missed appt....done....pt ok adn aware of new d.t

## 2014-09-12 ENCOUNTER — Telehealth: Payer: Self-pay | Admitting: Internal Medicine

## 2014-09-12 NOTE — Telephone Encounter (Signed)
lvm for pt regarding to 4.7 appt moved to 4.11 due to MD on pal.....mailed pt appt sched/avs and letter

## 2014-09-22 ENCOUNTER — Telehealth: Payer: Self-pay | Admitting: Internal Medicine

## 2014-09-22 NOTE — Telephone Encounter (Signed)
patient called to r/s appt...done....patient ok and aware

## 2014-09-27 ENCOUNTER — Other Ambulatory Visit: Payer: Self-pay | Admitting: Internal Medicine

## 2014-09-27 ENCOUNTER — Telehealth: Payer: Self-pay | Admitting: Medical Oncology

## 2014-09-27 DIAGNOSIS — C3490 Malignant neoplasm of unspecified part of unspecified bronchus or lung: Secondary | ICD-10-CM

## 2014-09-27 NOTE — Telephone Encounter (Signed)
I left message for Brittney Thomas to call back to confirm pts disposition from hospital -is she in rehab or at home ? Is she taking her chemo pill?

## 2014-09-27 NOTE — Telephone Encounter (Signed)
[name] called back -pt is in rehab at [facility]. He reported that pt has C diff. and that she is getting her Iressa. I called facility and she is not having much diarrhea and completed her vancomycin . Per Julien Nordmann I told nurse to keep her on iressa . I will call [name] to pick up Iressa refill rx at [place].

## 2014-10-05 ENCOUNTER — Telehealth: Payer: Self-pay | Admitting: Internal Medicine

## 2014-10-05 NOTE — Telephone Encounter (Signed)
left voicemail for pt regarding to 4.6 appt moved to 5.9 due to MD on pal.....mailed pt appt sched and letter

## 2014-10-06 ENCOUNTER — Ambulatory Visit: Payer: Medicare Other | Admitting: Internal Medicine

## 2014-10-10 ENCOUNTER — Ambulatory Visit: Payer: Medicare Other | Admitting: Internal Medicine

## 2014-10-25 ENCOUNTER — Ambulatory Visit: Payer: Medicare Other | Admitting: Internal Medicine

## 2014-11-07 ENCOUNTER — Encounter: Payer: Self-pay | Admitting: *Deleted

## 2014-11-07 ENCOUNTER — Telehealth: Payer: Self-pay | Admitting: Internal Medicine

## 2014-11-07 ENCOUNTER — Ambulatory Visit (HOSPITAL_BASED_OUTPATIENT_CLINIC_OR_DEPARTMENT_OTHER): Payer: Medicare Other | Admitting: Internal Medicine

## 2014-11-07 ENCOUNTER — Encounter: Payer: Self-pay | Admitting: Internal Medicine

## 2014-11-07 VITALS — BP 116/71 | HR 100 | Temp 98.2°F | Resp 18 | Ht 67.0 in | Wt 146.1 lb

## 2014-11-07 DIAGNOSIS — R197 Diarrhea, unspecified: Secondary | ICD-10-CM

## 2014-11-07 DIAGNOSIS — C3490 Malignant neoplasm of unspecified part of unspecified bronchus or lung: Secondary | ICD-10-CM

## 2014-11-07 DIAGNOSIS — C3412 Malignant neoplasm of upper lobe, left bronchus or lung: Secondary | ICD-10-CM | POA: Diagnosis not present

## 2014-11-07 NOTE — Telephone Encounter (Signed)
Pt confirmed labs/ov per 05/09 POF, gave pt AVS and Calendar..... KJ °

## 2014-11-07 NOTE — Progress Notes (Signed)
[place] [phone]   [phone]  OFFICE PROGRESS NOTE  [name], MD [place] [address]  DIAGNOSIS: Lung cancer, main bronchus  Staging form: Lung, AJCC 7th Edition  Clinical stage from 05/03/2014: Stage IV (T1a, N3, M1b) - [facility] One Molecular markers: positive for [id number], [id number], [id number]*  PRIOR THERAPY: systemic chemotherapy with carboplatin for an AUC of 5 and Alimta 500 mg/m given every 3 weeks. Status post one cycle, discontinued secondary to intolerance.  CURRENT THERAPY: Iressa 250 mg by mouth daily status post 18 weeks of treatment.  INTERVAL HISTORY: Brittney Thomas 81 y.o. female returns to the clinic today for follow-up visit accompanied by her daughter. The patient is feeling fine today with no specific complaints except for fatigue. She is tolerating her treatment with Iressa fairly well. She has very mild skin rash on the face and few episodes of diarrhea every now and then. She takes Imodium for diarrhea on as-needed basis. She denied having any significant chest pain but has shortness of breath with exertion was no cough or hemoptysis. The patient denied having any significant fever or chills, no nausea or vomiting. Her last CT scan of the chest performed in early March 2016 showed improvement in her disease.  MEDICAL HISTORY: Past Medical History  Diagnosis Date  . Ulcer 06-08-13    Left medial ankle  . Hypertension   . Thyroid disease     Hypo-Thyroidism  . Esophageal reflux   . COPD (chronic obstructive pulmonary disease)   . Peripheral vascular disease   . Hypothyroidism   . Insomnia     takes lorazepam  . Arthritis   . Dysrhythmia   . DVT (deep venous thrombosis) 2008  . Paroxysmal a-fib 2008    Anticoag stopped due to rectus sheath bleed  . PE (pulmonary thromboembolism) 2008  . Wears dentures   . Moderate to severe pulmonary hypertension   . Acute  and chronic respiratory failure with hypoxia 06/14/2014    December 2015 2 L of oxygen continuously   . Lung cancer, main bronchus 05/03/2014    LUL, non small cell   . Adenocarcinoma, lung 04/15/2014    EBUS 04/20/14 >NON small cell carcinoma , adenocarcinoma ;LEFT >refer to Oncology     . Hemorrhagic shock 05/27/2014  . Spontaneous hemorrhage     Rectus sheath hematoma, while on anticoag    ALLERGIES:  is allergic to poison ivy extract; sulfa antibiotics; and sulfacetamide sodium.  MEDICATIONS:  Current Outpatient Prescriptions  Medication Sig Dispense Refill  . acetaminophen (TYLENOL) 500 MG tablet Take 500 mg by mouth every 6 (six) hours as needed (pain).    [name] aspirin EC 81 MG tablet Take 1 tablet (81 mg total) by mouth daily.    . benzonatate (TESSALON) 100 MG capsule Take 1 capsule (100 mg total) by mouth 3 (three) times daily. 20 capsule 0  . calcium-vitamin D (OSCAL WITH D) 500-200 MG-UNIT per tablet Take 1 tablet by mouth daily with breakfast.     . feeding supplement, ENSURE COMPLETE, (ENSURE COMPLETE) LIQD Take 237 mLs by mouth 2 (two) times daily between meals.    . folic acid (FOLVITE) 1 MG tablet Take 1 tablet (1 mg total) by mouth daily. 30 tablet 3  . guaiFENesin-dextromethorphan (ROBITUSSIN DM) 100-10 MG/5ML syrup Take 5 mLs by mouth every 4 (four) hours as needed for cough. 118 mL 0  . HYDROcodone-acetaminophen (NORCO/VICODIN) 5-325 MG  per tablet Take 1 tablet by mouth every 6 (six) hours as needed for moderate pain. 30 tablet 0  . ipratropium-albuterol (DUONEB) 0.5-2.5 (3) MG/3ML SOLN Take 3 mLs by nebulization every 6 (six) hours as needed. 360 mL   . IRESSA 250 MG tablet TAKE 1 TABLET BY MOUTH ONCE DAILY 30 tablet 2  . levofloxacin (LEVAQUIN) 500 MG tablet Take 1 tablet (500 mg total) by mouth daily. 2 tablet 0  . levothyroxine (SYNTHROID, LEVOTHROID) 88 MCG tablet Take 88 mcg by mouth daily before breakfast.    . Omega-3 Fatty Acids (SALMON OIL-1000 PO) Take 1 tablet  by mouth daily.     . predniSONE (DELTASONE) 10 MG tablet Take 4 tabs on 09/06/14 then take 3 tabs 09/07/14 then take 2 tabs 09/08/14 then take 1 tab 09/09/14 then stop. 10 tablet   . temazepam (RESTORIL) 7.5 MG capsule Take 7.5 mg by mouth at bedtime as needed for sleep.    . metoprolol tartrate (LOPRESSOR) 25 MG tablet Take 0.5 tablets (12.5 mg total) by mouth 2 (two) times daily. 60 tablet 0   No current facility-administered medications for this visit.    SURGICAL HISTORY:  Past Surgical History  Procedure Laterality Date  . Tonsillectomy    . Eye surgery Right Aug. 2014    Cataract  . Eye surgery Left Oct. 2014    Cataract  . Abdominal hysterectomy  1984    Total  . Video bronchoscopy with endobronchial navigation N/A 04/20/2014    Procedure: VIDEO BRONCHOSCOPY WITH ENDOBRONCHIAL NAVIGATION;  Surgeon: Collene Gobble, MD;  Location: [facility];  Service: Thoracic;  Laterality: N/A;  . Video bronchoscopy with endobronchial ultrasound N/A 04/20/2014    Procedure: VIDEO BRONCHOSCOPY WITH ENDOBRONCHIAL ULTRASOUND;  Surgeon: Collene Gobble, MD;  Location: [facility];  Service: Thoracic;  Laterality: N/A;    REVIEW OF SYSTEMS:  A comprehensive review of systems was negative except for: Constitutional: positive for fatigue Gastrointestinal: positive for diarrhea   PHYSICAL EXAMINATION: General appearance: alert, cooperative, fatigued and no distress Head: Normocephalic, without obvious abnormality, atraumatic Neck: no adenopathy, no JVD, supple, symmetrical, trachea midline and thyroid not enlarged, symmetric, no tenderness/mass/nodules Lymph nodes: Cervical, supraclavicular, and axillary nodes normal. Resp: clear to auscultation bilaterally Back: symmetric, no curvature. ROM normal. No CVA tenderness. Cardio: regular rate and rhythm, S1, S2 normal, no murmur, click, rub or gallop GI: soft, non-tender; bowel sounds normal; no masses,  no organomegaly Extremities: extremities normal, atraumatic, no  cyanosis or edema  ECOG PERFORMANCE STATUS: 2 - Symptomatic, <50% confined to bed  Blood pressure 116/71, pulse 100, temperature 98.2 F (36.8 C), temperature source Oral, resp. rate 18, height 5' 7"  (1.702 m), weight 146 lb 1.6 oz (66.271 kg), SpO2 96 %.  LABORATORY DATA: Lab Results  Component Value Date   WBC 5.3 09/04/2014   HGB 11.3* 09/04/2014   HCT 35.5* 09/04/2014   MCV 107.6* 09/04/2014   PLT 122* 09/04/2014      Chemistry      Component Value Date/Time   NA 141 09/06/2014 0704   NA 137 09/01/2014 1040   K 4.7 09/06/2014 0704   K 4.7 09/01/2014 1040   CL 96 09/06/2014 0704   CO2 39* 09/06/2014 0704   CO2 36* 09/01/2014 1040   BUN 40* 09/06/2014 0704   BUN 30.2* 09/01/2014 1040   CREATININE 1.06 09/06/2014 0704   CREATININE 1.1 09/01/2014 1040      Component Value Date/Time   CALCIUM 9.3 09/06/2014 0704  CALCIUM 9.2 09/01/2014 1040   ALKPHOS 135* 09/04/2014 0644   ALKPHOS 154* 09/01/2014 1040   AST 39* 09/04/2014 0644   AST 38* 09/01/2014 1040   ALT 19 09/04/2014 0644   ALT 12 09/01/2014 1040   BILITOT 1.0 09/04/2014 0644   BILITOT 1.04 09/01/2014 1040       RADIOGRAPHIC STUDIES: No results found.  ASSESSMENT AND PLAN: This is a very pleasant 81 years old white female with a stage IV non-small cell lung cancer, adenocarcinoma with positive EGFR mutation currently undergoing treatment with Iressa 250 mg by mouth daily for more than 4 months and tolerating her treatment fairly well. Her last imaging studies 2 months ago showed improvement in her disease. I recommended for the patient to continue her current treatment with Tarceva as scheduled. I would see her back for follow-up visit in one month for reevaluation after repeating CT scan of the chest, abdomen and pelvis for restaging of her disease. For diarrhea, the patient will continue on Imodium on as-needed basis. The patient voices understanding of current disease status and treatment options and is in  agreement with the current care plan.  All questions were answered. The patient knows to call the clinic with any problems, questions or concerns. We can certainly see the patient much sooner if necessary.  Disclaimer: This note was dictated with voice recognition software. Similar sounding words can inadvertently be transcribed and may not be corrected upon review.

## 2014-11-29 ENCOUNTER — Encounter (HOSPITAL_COMMUNITY): Payer: Self-pay | Admitting: *Deleted

## 2014-11-29 ENCOUNTER — Inpatient Hospital Stay (HOSPITAL_COMMUNITY)
Admission: EM | Admit: 2014-11-29 | Discharge: 2014-12-09 | DRG: 189 | Disposition: A | Discharge status: Home Health | Payer: Medicare Other | Attending: Internal Medicine | Admitting: Internal Medicine

## 2014-11-29 DIAGNOSIS — Z86718 Personal history of other venous thrombosis and embolism: Secondary | ICD-10-CM

## 2014-11-29 DIAGNOSIS — K760 Fatty (change of) liver, not elsewhere classified: Secondary | ICD-10-CM | POA: Diagnosis present

## 2014-11-29 DIAGNOSIS — B961 Klebsiella pneumoniae [K. pneumoniae] as the cause of diseases classified elsewhere: Secondary | ICD-10-CM | POA: Diagnosis present

## 2014-11-29 DIAGNOSIS — J9601 Acute respiratory failure with hypoxia: Secondary | ICD-10-CM | POA: Diagnosis present

## 2014-11-29 DIAGNOSIS — I10 Essential (primary) hypertension: Secondary | ICD-10-CM | POA: Diagnosis present

## 2014-11-29 DIAGNOSIS — J9622 Acute and chronic respiratory failure with hypercapnia: Principal | ICD-10-CM | POA: Diagnosis present

## 2014-11-29 DIAGNOSIS — R0902 Hypoxemia: Secondary | ICD-10-CM | POA: Diagnosis not present

## 2014-11-29 DIAGNOSIS — I272 Other secondary pulmonary hypertension: Secondary | ICD-10-CM | POA: Diagnosis present

## 2014-11-29 DIAGNOSIS — R600 Localized edema: Secondary | ICD-10-CM | POA: Diagnosis present

## 2014-11-29 DIAGNOSIS — C3490 Malignant neoplasm of unspecified part of unspecified bronchus or lung: Secondary | ICD-10-CM | POA: Diagnosis present

## 2014-11-29 DIAGNOSIS — K7460 Unspecified cirrhosis of liver: Secondary | ICD-10-CM | POA: Diagnosis present

## 2014-11-29 DIAGNOSIS — R0989 Other specified symptoms and signs involving the circulatory and respiratory systems: Secondary | ICD-10-CM | POA: Diagnosis present

## 2014-11-29 DIAGNOSIS — R0602 Shortness of breath: Secondary | ICD-10-CM | POA: Diagnosis present

## 2014-11-29 DIAGNOSIS — I739 Peripheral vascular disease, unspecified: Secondary | ICD-10-CM | POA: Diagnosis present

## 2014-11-29 DIAGNOSIS — A0472 Enterocolitis due to Clostridium difficile, not specified as recurrent: Secondary | ICD-10-CM | POA: Diagnosis not present

## 2014-11-29 DIAGNOSIS — G4700 Insomnia, unspecified: Secondary | ICD-10-CM | POA: Diagnosis present

## 2014-11-29 DIAGNOSIS — Z79899 Other long term (current) drug therapy: Secondary | ICD-10-CM

## 2014-11-29 DIAGNOSIS — J690 Pneumonitis due to inhalation of food and vomit: Secondary | ICD-10-CM | POA: Diagnosis present

## 2014-11-29 DIAGNOSIS — Z86711 Personal history of pulmonary embolism: Secondary | ICD-10-CM | POA: Diagnosis present

## 2014-11-29 DIAGNOSIS — D500 Iron deficiency anemia secondary to blood loss (chronic): Secondary | ICD-10-CM | POA: Diagnosis present

## 2014-11-29 DIAGNOSIS — Z7982 Long term (current) use of aspirin: Secondary | ICD-10-CM

## 2014-11-29 DIAGNOSIS — I482 Chronic atrial fibrillation, unspecified: Secondary | ICD-10-CM | POA: Diagnosis present

## 2014-11-29 DIAGNOSIS — J9621 Acute and chronic respiratory failure with hypoxia: Secondary | ICD-10-CM | POA: Diagnosis present

## 2014-11-29 DIAGNOSIS — N390 Urinary tract infection, site not specified: Secondary | ICD-10-CM | POA: Diagnosis present

## 2014-11-29 DIAGNOSIS — K219 Gastro-esophageal reflux disease without esophagitis: Secondary | ICD-10-CM | POA: Diagnosis present

## 2014-11-29 DIAGNOSIS — I420 Dilated cardiomyopathy: Secondary | ICD-10-CM | POA: Diagnosis present

## 2014-11-29 DIAGNOSIS — I129 Hypertensive chronic kidney disease with stage 1 through stage 4 chronic kidney disease, or unspecified chronic kidney disease: Secondary | ICD-10-CM | POA: Diagnosis present

## 2014-11-29 DIAGNOSIS — Z882 Allergy status to sulfonamides status: Secondary | ICD-10-CM

## 2014-11-29 DIAGNOSIS — Z9981 Dependence on supplemental oxygen: Secondary | ICD-10-CM

## 2014-11-29 DIAGNOSIS — C3400 Malignant neoplasm of unspecified main bronchus: Secondary | ICD-10-CM | POA: Diagnosis present

## 2014-11-29 DIAGNOSIS — E440 Moderate protein-calorie malnutrition: Secondary | ICD-10-CM | POA: Diagnosis present

## 2014-11-29 DIAGNOSIS — Z881 Allergy status to other antibiotic agents status: Secondary | ICD-10-CM

## 2014-11-29 DIAGNOSIS — I872 Venous insufficiency (chronic) (peripheral): Secondary | ICD-10-CM | POA: Diagnosis present

## 2014-11-29 DIAGNOSIS — L8990 Pressure ulcer of unspecified site, unspecified stage: Secondary | ICD-10-CM | POA: Diagnosis present

## 2014-11-29 DIAGNOSIS — A047 Enterocolitis due to Clostridium difficile: Secondary | ICD-10-CM | POA: Diagnosis not present

## 2014-11-29 DIAGNOSIS — I82409 Acute embolism and thrombosis of unspecified deep veins of unspecified lower extremity: Secondary | ICD-10-CM | POA: Diagnosis present

## 2014-11-29 DIAGNOSIS — E46 Unspecified protein-calorie malnutrition: Secondary | ICD-10-CM | POA: Diagnosis present

## 2014-11-29 DIAGNOSIS — Z791 Long term (current) use of non-steroidal anti-inflammatories (NSAID): Secondary | ICD-10-CM

## 2014-11-29 DIAGNOSIS — D6859 Other primary thrombophilia: Secondary | ICD-10-CM | POA: Diagnosis present

## 2014-11-29 DIAGNOSIS — R578 Other shock: Secondary | ICD-10-CM | POA: Diagnosis present

## 2014-11-29 DIAGNOSIS — Z7901 Long term (current) use of anticoagulants: Secondary | ICD-10-CM

## 2014-11-29 DIAGNOSIS — Z79891 Long term (current) use of opiate analgesic: Secondary | ICD-10-CM

## 2014-11-29 DIAGNOSIS — E041 Nontoxic single thyroid nodule: Secondary | ICD-10-CM | POA: Diagnosis present

## 2014-11-29 DIAGNOSIS — J441 Chronic obstructive pulmonary disease with (acute) exacerbation: Secondary | ICD-10-CM | POA: Diagnosis present

## 2014-11-29 DIAGNOSIS — I9589 Other hypotension: Secondary | ICD-10-CM | POA: Diagnosis present

## 2014-11-29 DIAGNOSIS — Z91048 Other nonmedicinal substance allergy status: Secondary | ICD-10-CM

## 2014-11-29 DIAGNOSIS — N182 Chronic kidney disease, stage 2 (mild): Secondary | ICD-10-CM | POA: Diagnosis present

## 2014-11-29 DIAGNOSIS — N179 Acute kidney failure, unspecified: Secondary | ICD-10-CM | POA: Diagnosis present

## 2014-11-29 DIAGNOSIS — E039 Hypothyroidism, unspecified: Secondary | ICD-10-CM | POA: Diagnosis present

## 2014-11-29 DIAGNOSIS — I2699 Other pulmonary embolism without acute cor pulmonale: Secondary | ICD-10-CM | POA: Diagnosis present

## 2014-11-29 DIAGNOSIS — I824Y9 Acute embolism and thrombosis of unspecified deep veins of unspecified proximal lower extremity: Secondary | ICD-10-CM | POA: Diagnosis present

## 2014-11-29 DIAGNOSIS — I82519 Chronic embolism and thrombosis of unspecified femoral vein: Secondary | ICD-10-CM | POA: Diagnosis present

## 2014-11-29 DIAGNOSIS — E038 Other specified hypothyroidism: Secondary | ICD-10-CM | POA: Diagnosis present

## 2014-11-29 DIAGNOSIS — I480 Paroxysmal atrial fibrillation: Secondary | ICD-10-CM | POA: Diagnosis present

## 2014-11-29 DIAGNOSIS — I2782 Chronic pulmonary embolism: Secondary | ICD-10-CM | POA: Diagnosis present

## 2014-11-29 DIAGNOSIS — E872 Acidosis: Secondary | ICD-10-CM | POA: Diagnosis present

## 2014-11-29 DIAGNOSIS — D62 Acute posthemorrhagic anemia: Secondary | ICD-10-CM | POA: Diagnosis present

## 2014-11-29 NOTE — ED Notes (Signed)
Pt to ED via [facility] from home c/o shortness of breath after getting choked on home medications tonight. Family reported similar incident in the past and pt had a PE. Initial sats 78% on EMS arrival, HR 130s-140s in afib; and bp 140/78. Pt has wheezing in lower lobes. 2-albuterol breathing treatments and a duoneb, '125mg'$  solumedrol given enroute. Hx of lung cancer and COPD.

## 2014-11-30 ENCOUNTER — Inpatient Hospital Stay (HOSPITAL_BASED_OUTPATIENT_CLINIC_OR_DEPARTMENT_OTHER): Payer: Medicare Other

## 2014-11-30 ENCOUNTER — Emergency Department (HOSPITAL_COMMUNITY): Payer: Medicare Other

## 2014-11-30 ENCOUNTER — Inpatient Hospital Stay (HOSPITAL_COMMUNITY): Payer: Medicare Other

## 2014-11-30 DIAGNOSIS — I2699 Other pulmonary embolism without acute cor pulmonale: Secondary | ICD-10-CM

## 2014-11-30 DIAGNOSIS — I872 Venous insufficiency (chronic) (peripheral): Secondary | ICD-10-CM

## 2014-11-30 DIAGNOSIS — N182 Chronic kidney disease, stage 2 (mild): Secondary | ICD-10-CM | POA: Diagnosis present

## 2014-11-30 DIAGNOSIS — I82409 Acute embolism and thrombosis of unspecified deep veins of unspecified lower extremity: Secondary | ICD-10-CM | POA: Diagnosis not present

## 2014-11-30 DIAGNOSIS — I272 Other secondary pulmonary hypertension: Secondary | ICD-10-CM | POA: Diagnosis present

## 2014-11-30 DIAGNOSIS — E440 Moderate protein-calorie malnutrition: Secondary | ICD-10-CM | POA: Diagnosis present

## 2014-11-30 DIAGNOSIS — Z79891 Long term (current) use of opiate analgesic: Secondary | ICD-10-CM | POA: Diagnosis not present

## 2014-11-30 DIAGNOSIS — R0600 Dyspnea, unspecified: Secondary | ICD-10-CM

## 2014-11-30 DIAGNOSIS — Z86718 Personal history of other venous thrombosis and embolism: Secondary | ICD-10-CM | POA: Diagnosis not present

## 2014-11-30 DIAGNOSIS — R609 Edema, unspecified: Secondary | ICD-10-CM | POA: Diagnosis not present

## 2014-11-30 DIAGNOSIS — E872 Acidosis: Secondary | ICD-10-CM | POA: Diagnosis present

## 2014-11-30 DIAGNOSIS — Z86711 Personal history of pulmonary embolism: Secondary | ICD-10-CM

## 2014-11-30 DIAGNOSIS — C3400 Malignant neoplasm of unspecified main bronchus: Secondary | ICD-10-CM | POA: Diagnosis present

## 2014-11-30 DIAGNOSIS — B961 Klebsiella pneumoniae [K. pneumoniae] as the cause of diseases classified elsewhere: Secondary | ICD-10-CM | POA: Diagnosis present

## 2014-11-30 DIAGNOSIS — A047 Enterocolitis due to Clostridium difficile: Secondary | ICD-10-CM | POA: Diagnosis not present

## 2014-11-30 DIAGNOSIS — G4700 Insomnia, unspecified: Secondary | ICD-10-CM | POA: Diagnosis present

## 2014-11-30 DIAGNOSIS — J441 Chronic obstructive pulmonary disease with (acute) exacerbation: Secondary | ICD-10-CM

## 2014-11-30 DIAGNOSIS — I129 Hypertensive chronic kidney disease with stage 1 through stage 4 chronic kidney disease, or unspecified chronic kidney disease: Secondary | ICD-10-CM | POA: Diagnosis present

## 2014-11-30 DIAGNOSIS — N189 Chronic kidney disease, unspecified: Secondary | ICD-10-CM

## 2014-11-30 DIAGNOSIS — R0602 Shortness of breath: Secondary | ICD-10-CM

## 2014-11-30 DIAGNOSIS — Z882 Allergy status to sulfonamides status: Secondary | ICD-10-CM | POA: Diagnosis not present

## 2014-11-30 DIAGNOSIS — D62 Acute posthemorrhagic anemia: Secondary | ICD-10-CM | POA: Diagnosis present

## 2014-11-30 DIAGNOSIS — K7460 Unspecified cirrhosis of liver: Secondary | ICD-10-CM | POA: Diagnosis present

## 2014-11-30 DIAGNOSIS — Z79899 Other long term (current) drug therapy: Secondary | ICD-10-CM | POA: Diagnosis not present

## 2014-11-30 DIAGNOSIS — Z7982 Long term (current) use of aspirin: Secondary | ICD-10-CM | POA: Diagnosis not present

## 2014-11-30 DIAGNOSIS — I82519 Chronic embolism and thrombosis of unspecified femoral vein: Secondary | ICD-10-CM | POA: Diagnosis not present

## 2014-11-30 DIAGNOSIS — K760 Fatty (change of) liver, not elsewhere classified: Secondary | ICD-10-CM | POA: Diagnosis present

## 2014-11-30 DIAGNOSIS — I420 Dilated cardiomyopathy: Secondary | ICD-10-CM | POA: Diagnosis present

## 2014-11-30 DIAGNOSIS — I10 Essential (primary) hypertension: Secondary | ICD-10-CM | POA: Diagnosis not present

## 2014-11-30 DIAGNOSIS — J690 Pneumonitis due to inhalation of food and vomit: Secondary | ICD-10-CM | POA: Diagnosis present

## 2014-11-30 DIAGNOSIS — N179 Acute kidney failure, unspecified: Secondary | ICD-10-CM | POA: Diagnosis present

## 2014-11-30 DIAGNOSIS — Z7901 Long term (current) use of anticoagulants: Secondary | ICD-10-CM | POA: Diagnosis not present

## 2014-11-30 DIAGNOSIS — R0902 Hypoxemia: Secondary | ICD-10-CM | POA: Insufficient documentation

## 2014-11-30 DIAGNOSIS — J9622 Acute and chronic respiratory failure with hypercapnia: Principal | ICD-10-CM

## 2014-11-30 DIAGNOSIS — Z791 Long term (current) use of non-steroidal anti-inflammatories (NSAID): Secondary | ICD-10-CM | POA: Diagnosis not present

## 2014-11-30 DIAGNOSIS — K219 Gastro-esophageal reflux disease without esophagitis: Secondary | ICD-10-CM | POA: Diagnosis present

## 2014-11-30 DIAGNOSIS — J9601 Acute respiratory failure with hypoxia: Secondary | ICD-10-CM | POA: Diagnosis not present

## 2014-11-30 DIAGNOSIS — Z9981 Dependence on supplemental oxygen: Secondary | ICD-10-CM | POA: Diagnosis not present

## 2014-11-30 DIAGNOSIS — J9621 Acute and chronic respiratory failure with hypoxia: Secondary | ICD-10-CM | POA: Diagnosis not present

## 2014-11-30 DIAGNOSIS — Z91048 Other nonmedicinal substance allergy status: Secondary | ICD-10-CM | POA: Diagnosis not present

## 2014-11-30 DIAGNOSIS — I2782 Chronic pulmonary embolism: Secondary | ICD-10-CM | POA: Diagnosis present

## 2014-11-30 DIAGNOSIS — I482 Chronic atrial fibrillation: Secondary | ICD-10-CM | POA: Diagnosis present

## 2014-11-30 DIAGNOSIS — C3490 Malignant neoplasm of unspecified part of unspecified bronchus or lung: Secondary | ICD-10-CM

## 2014-11-30 DIAGNOSIS — I739 Peripheral vascular disease, unspecified: Secondary | ICD-10-CM | POA: Diagnosis present

## 2014-11-30 DIAGNOSIS — I480 Paroxysmal atrial fibrillation: Secondary | ICD-10-CM | POA: Diagnosis present

## 2014-11-30 DIAGNOSIS — N390 Urinary tract infection, site not specified: Secondary | ICD-10-CM | POA: Diagnosis present

## 2014-11-30 DIAGNOSIS — Z881 Allergy status to other antibiotic agents status: Secondary | ICD-10-CM | POA: Diagnosis not present

## 2014-11-30 DIAGNOSIS — I824Y2 Acute embolism and thrombosis of unspecified deep veins of left proximal lower extremity: Secondary | ICD-10-CM | POA: Diagnosis not present

## 2014-11-30 DIAGNOSIS — R0989 Other specified symptoms and signs involving the circulatory and respiratory systems: Secondary | ICD-10-CM | POA: Diagnosis not present

## 2014-11-30 DIAGNOSIS — E039 Hypothyroidism, unspecified: Secondary | ICD-10-CM | POA: Diagnosis present

## 2014-11-30 LAB — D-DIMER, QUANTITATIVE (NOT AT ARMC): D DIMER QUANT: 6.71 ug{FEU}/mL — AB (ref 0.00–0.48)

## 2014-11-30 LAB — CBC
HCT: 40.4 % (ref 36.0–46.0)
HEMATOCRIT: 36 % (ref 36.0–46.0)
HEMOGLOBIN: 11.6 g/dL — AB (ref 12.0–15.0)
HEMOGLOBIN: 12.9 g/dL (ref 12.0–15.0)
MCH: 34.2 pg — ABNORMAL HIGH (ref 26.0–34.0)
MCH: 34.3 pg — ABNORMAL HIGH (ref 26.0–34.0)
MCHC: 31.9 g/dL (ref 30.0–36.0)
MCHC: 32.2 g/dL (ref 30.0–36.0)
MCV: 106.5 fL — AB (ref 78.0–100.0)
MCV: 107.2 fL — AB (ref 78.0–100.0)
Platelets: 125 10*3/uL — ABNORMAL LOW (ref 150–400)
Platelets: 164 10*3/uL (ref 150–400)
RBC: 3.38 MIL/uL — ABNORMAL LOW (ref 3.87–5.11)
RBC: 3.77 MIL/uL — AB (ref 3.87–5.11)
RDW: 20.7 % — ABNORMAL HIGH (ref 11.5–15.5)
RDW: 20.8 % — AB (ref 11.5–15.5)
WBC: 4.1 10*3/uL (ref 4.0–10.5)
WBC: 4.2 10*3/uL (ref 4.0–10.5)

## 2014-11-30 LAB — BASIC METABOLIC PANEL
Anion gap: 16 — ABNORMAL HIGH (ref 5–15)
BUN: 32 mg/dL — ABNORMAL HIGH (ref 6–20)
CALCIUM: 9.2 mg/dL (ref 8.9–10.3)
CO2: 30 mmol/L (ref 22–32)
Chloride: 95 mmol/L — ABNORMAL LOW (ref 101–111)
Creatinine, Ser: 1.34 mg/dL — ABNORMAL HIGH (ref 0.44–1.00)
GFR calc Af Amer: 42 mL/min — ABNORMAL LOW (ref >60)
GFR calc non Af Amer: 36 mL/min — ABNORMAL LOW (ref >60)
Glucose, Bld: 131 mg/dL — ABNORMAL HIGH (ref 65–99)
Potassium: 4.3 mmol/L (ref 3.5–5.1)
Sodium: 141 mmol/L (ref 135–145)

## 2014-11-30 LAB — I-STAT ARTERIAL BLOOD GAS, ED
ACID-BASE EXCESS: 6 mmol/L — AB (ref 0.0–2.0)
ACID-BASE EXCESS: 4 mmol/L — AB (ref 0.0–2.0)
BICARBONATE: 32.4 meq/L — AB (ref 20.0–24.0)
Bicarbonate: 35.1 mEq/L — ABNORMAL HIGH (ref 20.0–24.0)
O2 Saturation: 97 %
O2 Saturation: 95 %
Patient temperature: 98
TCO2: 37 mmol/L (ref 0–100)
TCO2: 34 mmol/L (ref 0–100)
pCO2 arterial: 70 mmHg (ref 35.0–45.0)
pCO2 arterial: 63.1 mmHg (ref 35.0–45.0)
pH, Arterial: 7.306 — ABNORMAL LOW (ref 7.350–7.450)
pH, Arterial: 7.319 — ABNORMAL LOW (ref 7.350–7.450)
pO2, Arterial: 106 mmHg — ABNORMAL HIGH (ref 80.0–100.0)
pO2, Arterial: 82 mmHg (ref 80.0–100.0)

## 2014-11-30 LAB — APTT: APTT: 57 s — AB (ref 24–37)

## 2014-11-30 LAB — URINALYSIS, ROUTINE W REFLEX MICROSCOPIC
Bilirubin Urine: NEGATIVE
Glucose, UA: NEGATIVE mg/dL
HGB URINE DIPSTICK: NEGATIVE
Ketones, ur: NEGATIVE mg/dL
NITRITE: NEGATIVE
Protein, ur: 30 mg/dL — AB
SPECIFIC GRAVITY, URINE: 1.014 (ref 1.005–1.030)
UROBILINOGEN UA: 1 mg/dL (ref 0.0–1.0)
pH: 5 (ref 5.0–8.0)

## 2014-11-30 LAB — I-STAT TROPONIN, ED: TROPONIN I, POC: 0.01 ng/mL (ref 0.00–0.08)

## 2014-11-30 LAB — MRSA PCR SCREENING: MRSA by PCR: NEGATIVE

## 2014-11-30 LAB — I-STAT CG4 LACTIC ACID, ED: Lactic Acid, Venous: 2.27 mmol/L (ref 0.5–2.0)

## 2014-11-30 LAB — LACTIC ACID, PLASMA
LACTIC ACID, VENOUS: 3.4 mmol/L — AB (ref 0.5–2.0)
LACTIC ACID, VENOUS: 4.9 mmol/L — AB (ref 0.5–2.0)

## 2014-11-30 LAB — URINE MICROSCOPIC-ADD ON

## 2014-11-30 LAB — CREATININE, URINE, RANDOM: CREATININE, URINE: 84.79 mg/dL

## 2014-11-30 LAB — PROTIME-INR
INR: 2.23 — ABNORMAL HIGH (ref 0.00–1.49)
PROTHROMBIN TIME: 24.5 s — AB (ref 11.6–15.2)

## 2014-11-30 LAB — PROCALCITONIN: PROCALCITONIN: 0.13 ng/mL

## 2014-11-30 LAB — GLUCOSE, CAPILLARY: GLUCOSE-CAPILLARY: 142 mg/dL — AB (ref 65–99)

## 2014-11-30 LAB — STREP PNEUMONIAE URINARY ANTIGEN: Strep Pneumo Urinary Antigen: NEGATIVE

## 2014-11-30 LAB — PHOSPHORUS: PHOSPHORUS: 4.9 mg/dL — AB (ref 2.5–4.6)

## 2014-11-30 LAB — MAGNESIUM: Magnesium: 1.6 mg/dL — ABNORMAL LOW (ref 1.7–2.4)

## 2014-11-30 LAB — BRAIN NATRIURETIC PEPTIDE: B NATRIURETIC PEPTIDE 5: 477.8 pg/mL — AB (ref 0.0–100.0)

## 2014-11-30 MED ORDER — IPRATROPIUM-ALBUTEROL 0.5-2.5 (3) MG/3ML IN SOLN
3.0000 mL | Freq: Once | RESPIRATORY_TRACT | Status: AC
  Administered 2014-11-30: 3 mL via RESPIRATORY_TRACT
  Filled 2014-11-30: qty 3

## 2014-11-30 MED ORDER — METHYLPREDNISOLONE SODIUM SUCC 125 MG IJ SOLR
60.0000 mg | INTRAMUSCULAR | Status: DC
  Administered 2014-11-30 – 2014-12-01 (×2): 60 mg via INTRAVENOUS
  Filled 2014-11-30: qty 2
  Filled 2014-11-30: qty 0.96
  Filled 2014-11-30: qty 2

## 2014-11-30 MED ORDER — AZITHROMYCIN 250 MG PO TABS: 250.0000 mg | ORAL_TABLET | Freq: Every day | ORAL | Status: DC

## 2014-11-30 MED ORDER — HEPARIN (PORCINE) IN NACL 100-0.45 UNIT/ML-% IJ SOLN
900.0000 [IU]/h | INTRAMUSCULAR | Status: DC
  Administered 2014-11-30: 900 [IU]/h via INTRAVENOUS
  Filled 2014-11-30: qty 250

## 2014-11-30 MED ORDER — GUAIFENESIN-DM 100-10 MG/5ML PO SYRP
5.0000 mL | ORAL_SOLUTION | ORAL | Status: DC | PRN
  Administered 2014-12-05 – 2014-12-06 (×3): 5 mL via ORAL
  Filled 2014-11-30 (×5): qty 5

## 2014-11-30 MED ORDER — HEPARIN SODIUM (PORCINE) 5000 UNIT/ML IJ SOLN: 5000.0000 [IU] | Freq: Three times a day (TID) | INTRAMUSCULAR | Status: DC

## 2014-11-30 MED ORDER — BENZONATATE 100 MG PO CAPS
100.0000 mg | ORAL_CAPSULE | Freq: Three times a day (TID) | ORAL | Status: DC
  Administered 2014-11-30: 100 mg via ORAL
  Filled 2014-11-30 (×6): qty 1

## 2014-11-30 MED ORDER — FOLIC ACID 1 MG PO TABS
1.0000 mg | ORAL_TABLET | Freq: Every day | ORAL | Status: DC
  Administered 2014-12-01 – 2014-12-09 (×9): 1 mg via ORAL
  Filled 2014-11-30 (×10): qty 1

## 2014-11-30 MED ORDER — TEMAZEPAM 7.5 MG PO CAPS: 7.5000 mg | ORAL_CAPSULE | Freq: Every evening | ORAL | Status: DC | PRN

## 2014-11-30 MED ORDER — CALCIUM CARBONATE-VITAMIN D 500-200 MG-UNIT PO TABS
1.0000 | ORAL_TABLET | Freq: Every day | ORAL | Status: DC
  Administered 2014-12-01 – 2014-12-09 (×9): 1 via ORAL
  Filled 2014-11-30 (×14): qty 1

## 2014-11-30 MED ORDER — OMEGA-3-ACID ETHYL ESTERS 1 G PO CAPS
1000.0000 mg | ORAL_CAPSULE | Freq: Every day | ORAL | Status: DC
  Administered 2014-12-01 – 2014-12-09 (×9): 1000 mg via ORAL
  Filled 2014-11-30 (×10): qty 1

## 2014-11-30 MED ORDER — ALBUTEROL SULFATE (2.5 MG/3ML) 0.083% IN NEBU
10.0000 mg | INHALATION_SOLUTION | Freq: Once | RESPIRATORY_TRACT | Status: DC
Start: 2014-11-30 — End: 2014-11-30
  Filled 2014-11-30: qty 12

## 2014-11-30 MED ORDER — LEVOTHYROXINE SODIUM 88 MCG PO TABS
88.0000 ug | ORAL_TABLET | Freq: Every day | ORAL | Status: DC
  Administered 2014-12-01 – 2014-12-09 (×9): 88 ug via ORAL
  Filled 2014-11-30 (×14): qty 1

## 2014-11-30 MED ORDER — SODIUM CHLORIDE 0.9 % IV BOLUS (SEPSIS)
250.0000 mL | Freq: Once | INTRAVENOUS | Status: AC
  Administered 2014-11-30: 250 mL via INTRAVENOUS

## 2014-11-30 MED ORDER — ENSURE ENLIVE PO LIQD
237.0000 mL | Freq: Two times a day (BID) | ORAL | Status: DC
  Administered 2014-11-30 – 2014-12-01 (×3): 237 mL via ORAL

## 2014-11-30 MED ORDER — ASPIRIN EC 81 MG PO TBEC
81.0000 mg | DELAYED_RELEASE_TABLET | Freq: Every day | ORAL | Status: DC
  Administered 2014-12-01 – 2014-12-09 (×9): 81 mg via ORAL
  Filled 2014-11-30 (×10): qty 1

## 2014-11-30 MED ORDER — SODIUM CHLORIDE 0.9 % IV BOLUS (SEPSIS)
500.0000 mL | Freq: Once | INTRAVENOUS | Status: AC
  Administered 2014-11-30: 500 mL via INTRAVENOUS

## 2014-11-30 MED ORDER — LEVALBUTEROL HCL 1.25 MG/0.5ML IN NEBU
1.2500 mg | INHALATION_SOLUTION | Freq: Four times a day (QID) | RESPIRATORY_TRACT | Status: DC | PRN
  Administered 2014-11-30: 1.25 mg via RESPIRATORY_TRACT
  Filled 2014-11-30 (×3): qty 0.5

## 2014-11-30 MED ORDER — SODIUM CHLORIDE 0.9 % IV SOLN
INTRAVENOUS | Status: DC
  Administered 2014-11-30: 05:00:00 via INTRAVENOUS

## 2014-11-30 MED ORDER — ACETAMINOPHEN 500 MG PO TABS: 500.0000 mg | ORAL_TABLET | Freq: Four times a day (QID) | ORAL | Status: DC | PRN

## 2014-11-30 MED ORDER — DILTIAZEM HCL 25 MG/5ML IV SOLN
10.0000 mg | Freq: Once | INTRAVENOUS | Status: AC
  Administered 2014-11-30: 10 mg via INTRAVENOUS
  Filled 2014-11-30: qty 5

## 2014-11-30 MED ORDER — SODIUM CHLORIDE 0.9 % IV SOLN
INTRAVENOUS | Status: DC
  Administered 2014-11-30 – 2014-12-02 (×5): via INTRAVENOUS

## 2014-11-30 MED ORDER — ACETAMINOPHEN 325 MG PO TABS: 650.0000 mg | ORAL_TABLET | ORAL | Status: DC | PRN

## 2014-11-30 MED ORDER — HYDROCODONE-ACETAMINOPHEN 5-325 MG PO TABS: 1.0000 | ORAL_TABLET | Freq: Four times a day (QID) | ORAL | Status: DC | PRN

## 2014-11-30 MED ORDER — ZOLPIDEM TARTRATE 5 MG PO TABS
5.0000 mg | ORAL_TABLET | Freq: Every evening | ORAL | Status: DC | PRN
  Filled 2014-11-30: qty 1

## 2014-11-30 MED ORDER — METOPROLOL TARTRATE 12.5 MG HALF TABLET
12.5000 mg | ORAL_TABLET | Freq: Two times a day (BID) | ORAL | Status: DC
  Administered 2014-12-01 – 2014-12-09 (×12): 12.5 mg via ORAL
  Filled 2014-11-30 (×20): qty 1

## 2014-11-30 MED ORDER — AZITHROMYCIN 250 MG PO TABS: 500.0000 mg | ORAL_TABLET | Freq: Every day | ORAL | Status: DC

## 2014-11-30 MED ORDER — DILTIAZEM HCL 100 MG IV SOLR
5.0000 mg/h | INTRAVENOUS | Status: DC
  Administered 2014-11-30: 5 mg/h via INTRAVENOUS

## 2014-11-30 MED ORDER — IPRATROPIUM-ALBUTEROL 0.5-2.5 (3) MG/3ML IN SOLN: 3.0000 mL | RESPIRATORY_TRACT | Status: DC

## 2014-11-30 MED ORDER — PANTOPRAZOLE SODIUM 40 MG IV SOLR
40.0000 mg | Freq: Every day | INTRAVENOUS | Status: DC
  Administered 2014-11-30: 40 mg via INTRAVENOUS
  Filled 2014-11-30 (×2): qty 40

## 2014-11-30 MED ORDER — IPRATROPIUM-ALBUTEROL 0.5-2.5 (3) MG/3ML IN SOLN
3.0000 mL | Freq: Four times a day (QID) | RESPIRATORY_TRACT | Status: DC
  Administered 2014-11-30 – 2014-12-03 (×9): 3 mL via RESPIRATORY_TRACT
  Filled 2014-11-30 (×10): qty 3

## 2014-11-30 MED ORDER — GEFITINIB 250 MG PO TABS
250.0000 mg | ORAL_TABLET | Freq: Every day | ORAL | Status: DC
  Administered 2014-11-30 – 2014-12-09 (×10): 250 mg via ORAL
  Filled 2014-11-30 (×4): qty 1

## 2014-11-30 NOTE — Progress Notes (Addendum)
PT Cancellation Note  Patient Details Name: Brittney Thomas MRN: [id number] DOB: 11/13/1932   Cancelled Treatment:    Reason Eval/Treat Not Completed: Patient not medically ready. Noted that pt is to go for VQ scan to rule out PE. Will hold therapy evaluation this AM and check back as schedule allows.   Rolinda Roan 11/30/2014, 7:20 AM    Addendum: Checked back after pt was transferred to the unit from ED. Discussed pt case with RN who requests therapy hold eval today and check back tomorrow.   Rolinda Roan, PT, DPT Acute Rehabilitation Services Pager: [phone]

## 2014-11-30 NOTE — ED Notes (Signed)
Returned from doppler study

## 2014-11-30 NOTE — ED Notes (Signed)
Pt off unit for venous doppler

## 2014-11-30 NOTE — ED Notes (Signed)
Paged and spoke to Dr.Niu regarding bp 78/48; verbal order obtained for 1,032m bolus. Pt resting; easily arousable. CCM consulted by Dr.Niu

## 2014-11-30 NOTE — Progress Notes (Signed)
CRITICAL VALUE ALERT  Critical value received: lactic acid 4.9  Date of notification:  11/30/14  Time of notification: 0801  Critical value read back: Yes  Nurse who received alert:  Polly Cobia RN   MD notified (1st page):  MD Mungal   Time of first page: 0806  MD notified (2nd page):  Time of second page:  Responding MD: Starling Manns RN   Time MD responded:  [phone]

## 2014-11-30 NOTE — ED Notes (Signed)
VQ scan will be done early this afternoon due to having to order medication that is outside of the hospital.

## 2014-11-30 NOTE — ED Notes (Signed)
Spoke to [name] [phone]), pt's daughter, regarding code status. Daughter is healthcare POA and will provide paperwork as need. Pt is a full code status. Dr.Niu informed

## 2014-11-30 NOTE — Evaluation (Signed)
Clinical/Bedside Swallow Evaluation Patient Details  Name: Brittney Thomas MRN: [id number] Date of Birth: August 18, 1932  Today's Date: 11/30/2014 Time: SLP Start Time (ACUTE ONLY): 1434 SLP Stop Time (ACUTE ONLY): 1454 SLP Time Calculation (min) (ACUTE ONLY): 20 min  Past Medical History:  Past Medical History  Diagnosis Date  . Ulcer 06-08-13    Left medial ankle  . Hypertension   . Thyroid disease     Hypo-Thyroidism  . Esophageal reflux   . COPD (chronic obstructive pulmonary disease)   . Peripheral vascular disease   . Hypothyroidism   . Insomnia     takes lorazepam  . Arthritis   . Dysrhythmia   . DVT (deep venous thrombosis) 2008  . Paroxysmal a-fib 2008    Anticoag stopped due to rectus sheath bleed  . PE (pulmonary thromboembolism) 2008  . Wears dentures   . Moderate to severe pulmonary hypertension   . Acute and chronic respiratory failure with hypoxia 06/14/2014    December 2015 2 L of oxygen continuously   . Lung cancer, main bronchus 05/03/2014    LUL, non small cell   . Adenocarcinoma, lung 04/15/2014    EBUS 04/20/14 >NON small cell carcinoma , adenocarcinoma ;LEFT >refer to Oncology     . Hemorrhagic shock 05/27/2014  . Spontaneous hemorrhage     Rectus sheath hematoma, while on anticoag   Past Surgical History:  Past Surgical History  Procedure Laterality Date  . Tonsillectomy    . Eye surgery Right Aug. 2014    Cataract  . Eye surgery Left Oct. 2014    Cataract  . Abdominal hysterectomy  1984    Total  . Video bronchoscopy with endobronchial navigation N/A 04/20/2014    Procedure: VIDEO BRONCHOSCOPY WITH ENDOBRONCHIAL NAVIGATION;  Surgeon: Collene Gobble, MD;  Location: [facility];  Service: Thoracic;  Laterality: N/A;  . Video bronchoscopy with endobronchial ultrasound N/A 04/20/2014    Procedure: VIDEO BRONCHOSCOPY WITH ENDOBRONCHIAL ULTRASOUND;  Surgeon: Collene Gobble, MD;  Location: [facility];  Service: Thoracic;  Laterality: N/A;   HPI:  81 yo, never  smoker, with known adenocarcinoma of lung and mets, who was taking her oral meds 5/31 and choked on a pill. EMS was activated and she was treated with bronchodilators and steroids. She was noted to be hypoxic and was placed on heparin for presumed PE and Afib. Note in 11/15 she had rectus sheath hematoma and all anticoagulation was discontinued at that time. Pt reports intermittent difficulty at home swallowing her pills, although denies dysphagia to food/drink.   Assessment / Plan / Recommendation Clinical Impression  Pt has intermittent coughing noted during trials of regular textures only, suspect due to premature spillage of dry pieces in light of decreased respiratory support and h/o COPD. Otherwise, she does not show overt signs of aspiration with thin liquids and purees. Recommend Dys 3 diet and thin liquids. Given reported h/o difficulty swallowing pills, would favor administering them whole in purees, although larger pills may need to be crushed. SLP to follow for tolerance.    Aspiration Risk  Mild    Diet Recommendation Dysphagia 3 (Mech soft);Thin   Medication Administration: Whole meds with puree Compensations: Slow rate;Small sips/bites    Other  Recommendations Oral Care Recommendations: Oral care BID   Follow Up Recommendations    tba   Frequency and Duration min 2x/week  1 week   Pertinent Vitals/Pain [facility]    SLP Swallow Goals     Swallow Study Prior Functional  Status       General Other Pertinent Information: 81 yo, never smoker, with known adenocarcinoma of lung and mets, who was taking her oral meds 5/31 and choked on a pill. EMS was activated and she was treated with bronchodilators and steroids. She was noted to be hypoxic and was placed on heparin for presumed PE and Afib. Note in 11/15 she had rectus sheath hematoma and all anticoagulation was discontinued at that time. Pt reports intermittent difficulty at home swallowing her pills, although denies dysphagia to  food/drink. Type of Study: Bedside swallow evaluation Previous Swallow Assessment: none in chart, although she says that she previously worked wtih SLP at [facility] who recommended regular textures and thin liquids Diet Prior to this Study: NPO Temperature Spikes Noted: No Respiratory Status: Supplemental O2 delivered via (comment) ([place]) History of Recent Intubation: No Behavior/Cognition: Alert;Cooperative;Pleasant mood Oral Cavity - Dentition: Other (Comment) (dentures on top, partials on bottom) Self-Feeding Abilities: Able to feed self Patient Positioning: Upright in bed Baseline Vocal Quality: Normal Volitional Swallow: Able to elicit    Oral/Motor/Sensory Function Overall Oral Motor/Sensory Function: Appears within functional limits for tasks assessed   Ice Chips Ice chips: Not tested   Thin Liquid Thin Liquid: Impaired Presentation: Cup;Self Fed;Straw Pharyngeal  Phase Impairments: Suspected delayed Swallow    Nectar Thick Nectar Thick Liquid: Not tested   Honey Thick Honey Thick Liquid: Not tested   Puree Puree: Within functional limits Presentation: Self Fed;Spoon   Solid    Solid: Impaired Presentation: Self Fed Pharyngeal Phase Impairments: Cough - Immediate;Cough - Delayed      Germain Osgood, M.A. CCC-SLP [phone]  Germain Osgood 11/30/2014,3:05 PM

## 2014-11-30 NOTE — Progress Notes (Signed)
VASCULAR LAB PRELIMINARY  PRELIMINARY  PRELIMINARY  PRELIMINARY  Bilateral lower extremity venous duplex completed.    Preliminary report:  Technically difficult due to moderate interstitial fluid throughout and pulsatile Doppler signals Bilateral:  No  Obvious evidence of DVT, superficial thrombosis, or Baker's Cyst. Doppler signals pusatile throughout consistent with fluid overload vs congestive heart failure   Toree Edling, RVS 11/30/2014, 8:39 AM

## 2014-11-30 NOTE — Progress Notes (Signed)
Echocardiogram 2D Echocardiogram has been performed.  Tresa Res 11/30/2014, 4:21 PM

## 2014-11-30 NOTE — Progress Notes (Signed)
ANTICOAGULATION CONSULT NOTE - Initial Consult  Pharmacy Consult for Heparin Indication: r/o PE  Allergies  Allergen Reactions  . Poison Ivy Extract [Extract Of Poison Ivy] Rash  . Sulfa Antibiotics Rash  . Sulfacetamide Sodium Rash    Patient Measurements:    Ht: 67 in   Wt:  66 kg Heparin Dosing Weight: 66 kg  Vital Signs: Temp: 97.5 F (36.4 C) (06/01 0001) Temp Source: Oral (06/01 0001) BP: 85/52 mmHg (06/01 0530) Pulse Rate: 118 (06/01 0530)  Labs:  Recent Labs  11/30/14 0015  HGB 12.9  HCT 40.4  PLT 164  LABPROT 24.5*  INR 2.23*  CREATININE 1.34*    CrCl cannot be calculated (Unknown ideal weight.).   Medical History: Past Medical History  Diagnosis Date  . Ulcer 06-08-13    Left medial ankle  . Hypertension   . Thyroid disease     Hypo-Thyroidism  . Esophageal reflux   . COPD (chronic obstructive pulmonary disease)   . Peripheral vascular disease   . Hypothyroidism   . Insomnia     takes lorazepam  . Arthritis   . Dysrhythmia   . DVT (deep venous thrombosis) 2008  . Paroxysmal a-fib 2008    Anticoag stopped due to rectus sheath bleed  . PE (pulmonary thromboembolism) 2008  . Wears dentures   . Moderate to severe pulmonary hypertension   . Acute and chronic respiratory failure with hypoxia 06/14/2014    December 2015 2 L of oxygen continuously   . Lung cancer, main bronchus 05/03/2014    LUL, non small cell   . Adenocarcinoma, lung 04/15/2014    EBUS 04/20/14 >NON small cell carcinoma , adenocarcinoma ;LEFT >refer to Oncology     . Hemorrhagic shock 05/27/2014  . Spontaneous hemorrhage     Rectus sheath hematoma, while on anticoag    Medications:  See electronic med rec  Assessment: 81 y.o. female presents with SOB. Noted pt has h/o DVT and PE (2008) - patient was taken off of Coumadin secondary to rectus sheath hematoma/hemorrhage November 2015. Pt also with afib and adenocarcinoma of the lung (being treated with Iressa). H/H and plt  ok at baseline. INR 2.23 (was 1.32 ~5 mos ago). D-dimer 6.71. To begin heparin per pharmacy for r/o PE.  Goal of Therapy:  Heparin level 0.3-0.7 units/ml Monitor platelets by anticoagulation protocol: Yes   Plan:  D/c SQ heparin Heparin gtt at 900 units/hr. No bolus. Will f/u heparin level in 8 hours Daily heparin level and CBC F/u VQ scan results  Sherlon Handing, PharmD, BCPS Clinical pharmacist, pager [phone] 11/30/2014,5:56 AM

## 2014-11-30 NOTE — Procedures (Signed)
BIPAP placed on pt however, even with several changes in the pressure, the pt is not able to tolerate the machine.  Dr. Tawnya Crook notified and pt placed back on NRB.

## 2014-11-30 NOTE — ED Provider Notes (Signed)
CSN: [id number]     Arrival date & time 11/29/14  2349 History  This chart was scribed for Ernestina Patches, MD by Chester Holstein, ED Scribe. This patient was seen in room D33C/D33C and the patient's care was started at 12:06 AM.    Chief Complaint  Patient presents with  . Shortness of Breath     Patient is a 81 y.o. female presenting with shortness of breath. The history is provided by the patient. No language interpreter was used.  Shortness of Breath Severity:  Severe Onset quality:  Sudden Duration:  2 hours Timing:  Constant Progression:  Improving Chronicity:  Recurrent Context comment:  Swallowing medication Relieved by:  Nothing Worsened by:  Nothing tried Ineffective treatments: breathing treatments. Associated symptoms: cough and sore throat   Associated symptoms: no abdominal pain, no chest pain, no diaphoresis, no fever, no headaches, no neck pain and no vomiting   Cough:    Cough characteristics:  Dry   Severity:  Severe   Onset quality:  Sudden   Duration:  2 hours   Timing:  Constant   Progression:  Unchanged   Chronicity:  New Sore throat:    Severity:  Moderate   Onset quality:  Sudden   Duration:  2 hours   Timing:  Constant   Progression:  Unchanged  HPI Comments: Brittney Thomas is a 81 y.o. female brought in by ambulance, who presents to the Emergency Department complaining of SOB with onset around 10:30 PM. Pt states she took her bedtime medication, including her Levaquin, and a Tessalon pearl one went down her throat wrong causing her to choke. She states she was able to cough it back up but states her esophagus felt numb. She notes cough with associated sore throat. Upon EMS arrival pt's O2 stat was 78%. Pt was given 2 albuterol treatments, 1 douneb, and '125mg'$  solumedrol en route to the ED.  Pt reports symptoms have improved upon arrival to ED. Pt is on Eliquis. Pt lives with her daughter. Pt no longer wears O2 at home as changed in the last 2 weeks. She  stats her O2 stats are between 88%-96% at baseline. Pt denies inhaler/nebulizer use at home and states she has never been a smoker. Pt reports similar episode of swallowing pill wrong before discovery of PE. Pt denies fever, chills, and chest pain.  Past Medical History  Diagnosis Date  . Ulcer 06-08-13    Left medial ankle  . Hypertension   . Thyroid disease     Hypo-Thyroidism  . Esophageal reflux   . COPD (chronic obstructive pulmonary disease)   . Peripheral vascular disease   . Hypothyroidism   . Insomnia     takes lorazepam  . Arthritis   . Dysrhythmia   . DVT (deep venous thrombosis) 2008  . Paroxysmal a-fib 2008    Anticoag stopped due to rectus sheath bleed  . PE (pulmonary thromboembolism) 2008  . Wears dentures   . Moderate to severe pulmonary hypertension   . Acute and chronic respiratory failure with hypoxia 06/14/2014    December 2015 2 L of oxygen continuously   . Lung cancer, main bronchus 05/03/2014    LUL, non small cell   . Adenocarcinoma, lung 04/15/2014    EBUS 04/20/14 >NON small cell carcinoma , adenocarcinoma ;LEFT >refer to Oncology     . Hemorrhagic shock 05/27/2014  . Spontaneous hemorrhage     Rectus sheath hematoma, while on anticoag   Past Surgical History  Procedure  Laterality Date  . Tonsillectomy    . Eye surgery Right Aug. 2014    Cataract  . Eye surgery Left Oct. 2014    Cataract  . Abdominal hysterectomy  1984    Total  . Video bronchoscopy with endobronchial navigation N/A 04/20/2014    Procedure: VIDEO BRONCHOSCOPY WITH ENDOBRONCHIAL NAVIGATION;  Surgeon: Collene Gobble, MD;  Location: [place];  Service: Thoracic;  Laterality: N/A;  . Video bronchoscopy with endobronchial ultrasound N/A 04/20/2014    Procedure: VIDEO BRONCHOSCOPY WITH ENDOBRONCHIAL ULTRASOUND;  Surgeon: Collene Gobble, MD;  Location: [facility];  Service: Thoracic;  Laterality: N/A;   Family History  Problem Relation Age of Onset  . Heart disease Maternal Grandmother   .  Cancer Brother     kidney  . Stroke Brother    History  Substance Use Topics  . Smoking status: Never Smoker   . Smokeless tobacco: Never Used  . Alcohol Use: No   OB History    No data available     Review of Systems  Constitutional: Negative for fever, chills, diaphoresis, activity change, appetite change and fatigue.  HENT: Positive for sore throat. Negative for congestion, facial swelling and rhinorrhea.   Eyes: Negative for photophobia and discharge.  Respiratory: Positive for cough and shortness of breath. Negative for chest tightness.   Cardiovascular: Negative for chest pain, palpitations and leg swelling.  Gastrointestinal: Negative for nausea, vomiting, abdominal pain and diarrhea.  Endocrine: Negative for polydipsia and polyuria.  Genitourinary: Negative for dysuria, frequency, difficulty urinating and pelvic pain.  Musculoskeletal: Negative for back pain, arthralgias, neck pain and neck stiffness.  Skin: Negative for color change and wound.  Allergic/Immunologic: Negative for immunocompromised state.  Neurological: Negative for facial asymmetry, weakness, numbness and headaches.  Hematological: Does not bruise/bleed easily.  Psychiatric/Behavioral: Negative for confusion and agitation.      Allergies  Poison ivy extract; Sulfa antibiotics; and Sulfacetamide sodium  Home Medications   Prior to Admission medications   Medication Sig Start Date End Date Taking? Authorizing Provider  acetaminophen (TYLENOL) 500 MG tablet Take 500 mg by mouth every 6 (six) hours as needed (pain).   Yes Historical Provider, MD  apixaban (ELIQUIS) 2.5 MG TABS tablet Take 2.5 mg by mouth 2 (two) times daily.   Yes Historical Provider, MD  aspirin EC 81 MG tablet Take 1 tablet (81 mg total) by mouth daily. 09/06/14  Yes Lezlie Octave Black, NP  benzonatate (TESSALON) 100 MG capsule Take 1 capsule (100 mg total) by mouth 3 (three) times daily. Patient taking differently: Take 100 mg by mouth 2  (two) times daily.  09/06/14  Yes Radene Gunning, NP  calcium-vitamin D (OSCAL WITH D) 500-200 MG-UNIT per tablet Take 1 tablet by mouth daily with breakfast.    Yes Historical Provider, MD  folic acid (FOLVITE) 1 MG tablet Take 1 tablet (1 mg total) by mouth daily. 05/03/14  Yes Curt Bears, MD  furosemide (LASIX) 20 MG tablet Take 20 mg by mouth 2 (two) times daily.   Yes Historical Provider, MD  HYDROcodone-acetaminophen (NORCO/VICODIN) 5-325 MG per tablet Take 1 tablet by mouth every 6 (six) hours as needed for moderate pain. 09/06/14  Yes Lezlie Octave Black, NP  IRESSA 250 MG tablet TAKE 1 TABLET BY MOUTH ONCE DAILY 09/27/14  Yes Curt Bears, MD  levothyroxine (SYNTHROID, LEVOTHROID) 88 MCG tablet Take 88 mcg by mouth daily before breakfast.   Yes Historical Provider, MD  metoprolol tartrate (LOPRESSOR) 25 MG tablet  Take 0.5 tablets (12.5 mg total) by mouth 2 (two) times daily. 07/07/14 11/30/14 Yes Florencia Reasons, MD  Omega-3 Fatty Acids (SALMON OIL-1000 PO) Take 1 tablet by mouth daily.    Yes Historical Provider, MD  vitamin C (ASCORBIC ACID) 500 MG tablet Take 500 mg by mouth 2 (two) times daily.   Yes Historical Provider, MD  ipratropium-albuterol (DUONEB) 0.5-2.5 (3) MG/3ML SOLN Take 3 mLs by nebulization every 6 (six) hours as needed. Patient not taking: Reported on 11/30/2014 09/06/14   Radene Gunning, NP   BP 112/71 mmHg  Pulse 90  Temp(Src) 97.5 F (36.4 C) (Oral)  Resp 21  Wt 171 lb 15.3 oz (78 kg)  SpO2 92% Physical Exam  Constitutional: She is oriented to person, place, and time. She appears well-developed and well-nourished. No distress.  HENT:  Head: Normocephalic.  Mouth/Throat: Oropharynx is clear and moist.  Eyes: Pupils are equal, round, and reactive to light.  Neck: Neck supple.  Cardiovascular: Normal rate, regular rhythm and normal heart sounds.   Pulmonary/Chest: Effort normal. No respiratory distress. She has wheezes (expiratory right lower and left lower fields).  Abdominal:  Soft. She exhibits no distension. There is no tenderness. There is no rebound and no guarding.  Musculoskeletal: She exhibits no edema (1+ bilateral pitting) or tenderness.  Hyperpigmentation of bilateral LEs  Neurological: She is alert and oriented to person, place, and time.  Skin: Skin is warm and dry.  Psychiatric: She has a normal mood and affect.  Nursing note and vitals reviewed.   ED Course  Procedures (including critical care time) DIAGNOSTIC STUDIES: Oxygen Saturation is 99% on 15 L O2, normal by my interpretation.    COORDINATION OF CARE: 10:48 AM Discussed treatment plan with patient at beside, the patient agrees with the plan and has no further questions at this time.   Labs Review Labs Reviewed  CBC - Abnormal; Notable for the following:    RBC 3.77 (*)    MCV 107.2 (*)    MCH 34.2 (*)    RDW 20.8 (*)    All other components within normal limits  BASIC METABOLIC PANEL - Abnormal; Notable for the following:    Chloride 95 (*)    Glucose, Bld 131 (*)    BUN 32 (*)    Creatinine, Ser 1.34 (*)    GFR calc non Af Amer 36 (*)    GFR calc Af Amer 42 (*)    Anion gap 16 (*)    All other components within normal limits  BRAIN NATRIURETIC PEPTIDE - Abnormal; Notable for the following:    B Natriuretic Peptide 477.8 (*)    All other components within normal limits  D-DIMER, QUANTITATIVE (NOT AT [facility]) - Abnormal; Notable for the following:    D-Dimer, Quant 6.71 (*)    All other components within normal limits  URINALYSIS, ROUTINE W REFLEX MICROSCOPIC (NOT AT [facility]) - Abnormal; Notable for the following:    APPearance CLOUDY (*)    Protein, ur 30 (*)    Leukocytes, UA TRACE (*)    All other components within normal limits  PROTIME-INR - Abnormal; Notable for the following:    Prothrombin Time 24.5 (*)    INR 2.23 (*)    All other components within normal limits  URINE MICROSCOPIC-ADD ON - Abnormal; Notable for the following:    Bacteria, UA MANY (*)    Casts  GRANULAR CAST (*)    Crystals CA OXALATE CRYSTALS (*)    All  other components within normal limits  MAGNESIUM - Abnormal; Notable for the following:    Magnesium 1.6 (*)    All other components within normal limits  PHOSPHORUS - Abnormal; Notable for the following:    Phosphorus 4.9 (*)    All other components within normal limits  LACTIC ACID, PLASMA - Abnormal; Notable for the following:    Lactic Acid, Venous 3.4 (*)    All other components within normal limits  CBC - Abnormal; Notable for the following:    RBC 3.38 (*)    Hemoglobin 11.6 (*)    MCV 106.5 (*)    MCH 34.3 (*)    RDW 20.7 (*)    Platelets 125 (*)    All other components within normal limits  APTT - Abnormal; Notable for the following:    aPTT 57 (*)    All other components within normal limits  GLUCOSE, CAPILLARY - Abnormal; Notable for the following:    Glucose-Capillary 142 (*)    All other components within normal limits  LACTIC ACID, PLASMA - Abnormal; Notable for the following:    Lactic Acid, Venous 4.9 (*)    All other components within normal limits  CBC - Abnormal; Notable for the following:    RBC 3.38 (*)    Hemoglobin 11.7 (*)    HCT 35.6 (*)    MCV 105.3 (*)    MCH 34.6 (*)    RDW 21.0 (*)    Platelets 127 (*)    All other components within normal limits  BASIC METABOLIC PANEL - Abnormal; Notable for the following:    Chloride 98 (*)    Glucose, Bld 114 (*)    BUN 34 (*)    Creatinine, Ser 1.18 (*)    Calcium 8.8 (*)    GFR calc non Af Amer 42 (*)    GFR calc Af Amer 49 (*)    All other components within normal limits  I-STAT CG4 LACTIC ACID, ED - Abnormal; Notable for the following:    Lactic Acid, Venous 2.27 (*)    All other components within normal limits  I-STAT ARTERIAL BLOOD GAS, ED - Abnormal; Notable for the following:    pH, Arterial 7.306 (*)    pCO2 arterial 70.0 (*)    pO2, Arterial 106.0 (*)    Bicarbonate 35.1 (*)    Acid-Base Excess 6.0 (*)    All other components  within normal limits  I-STAT ARTERIAL BLOOD GAS, ED - Abnormal; Notable for the following:    pH, Arterial 7.319 (*)    pCO2 arterial 63.1 (*)    Bicarbonate 32.4 (*)    Acid-Base Excess 4.0 (*)    All other components within normal limits  CULTURE, BLOOD (ROUTINE X 2)  CULTURE, BLOOD (ROUTINE X 2)  MRSA PCR SCREENING  CULTURE, EXPECTORATED SPUTUM-ASSESSMENT  GRAM STAIN  URINE CULTURE  CREATININE, URINE, RANDOM  STREP PNEUMONIAE URINARY ANTIGEN  PROCALCITONIN  UREA NITROGEN, URINE  LEGIONELLA ANTIGEN, URINE  BLOOD GAS, ARTERIAL  I-STAT TROPOININ, ED    Imaging Review US Renal Port  11/30/2014   CLINICAL DATA:  Acute kidney injury. History of hypertension, lung cancer.  EXAM: RENAL / URINARY TRACT ULTRASOUND COMPLETE  COMPARISON:  CT of the chest, abdomen and pelvis August 31, 2004  FINDINGS: Right Kidney:  Length: 10.3 cm. Increased cortical echogenicity. No mass or hydronephrosis visualized.  Left Kidney:  Length: 10 cm. Increased cortical echogenicity. No mass or hydronephrosis visualized.  Bladder:  Appears normal  for degree of bladder distention. Small amount of ascites seen in the abdomen.  IMPRESSION: Echogenic kidneys can be seen with medical renal disease without obstructive uropathy.  At least mild amount of ascites.   Electronically Signed   By: Elon Alas M.D.   On: 11/30/2014 06:02   Dg Chest Port 1 View  11/30/2014   CLINICAL DATA:  Shortness of breath after choking on medication tonight. History of lung cancer.  EXAM: PORTABLE CHEST - 1 VIEW  COMPARISON:  Radiographs 09/04/2014.  CT 09/01/2014  FINDINGS: Cardiomegaly is unchanged. Decreased vascular congestion from prior. The left upper lobe pulmonary nodule on prior CT is not seen radiographically. There is no consolidation, large pleural effusion or pneumothorax. No evident pneumomediastinum. Osseous structures are stable.  IMPRESSION: 1. Decreased vascular congestion.  Stable cardiomegaly. 2. No acute process.    Electronically Signed   By: Jeb Levering M.D.   On: 11/30/2014 01:18     EKG Interpretation   Date/Time:  Tuesday Nov 29 2014 23:56:44 EDT Ventricular Rate:  131 PR Interval:    QRS Duration: 95 QT Interval:  305 QTC Calculation: 450 R Axis:   121 Text Interpretation:  Atrial fibrillation with rapid ventricular response  Right axis deviation Borderline low voltage, extremity leads  Repolarization abnormality, prob rate related Confirmed by DOCHERTY  MD,  MEGAN ([id number]) on 11/30/2014 12:04:47 AM     3:29 AM-Consult complete with hospitalist. Patient case explained and discussed. Hospitalist agrees to admit patient for further evaluation and treatment. Call ended at 3:31 AM  3:40 AM Pt's CO2 71%, orders for BiPAP placed  MDM   Final diagnoses:  Hypoxia  Acute on chronic respiratory failure with hypercapnia    Pt is a 81 y.o. female with Pmhx as above who presents with report of sudden oset SOB after getting choked on a tessalon pearl. Pt presents with in a fib, with HR 110-130's, hypoxia, tahcypnea, placed on [place]< transitioned ot NRB when sats dropped when sleeping. Pt has scattered wheezing and rhonchi throughout, 1+ BLLE edema. Afebrile. CXR with no acute process. BNP mildly elevated, trop nml, EKG with unchanged a fib. LA midly elevated, though doubt acute infection given hx of sudden onset symptoms after choking. Pt anticoagulated on eliquis. ABG showed pt retaining CO2, was placed on BiPAP, at this time pt not sure if she would want to be intubated. [facility] will admit to stepdown. PE considered, less likely given anticoagulation, admitting team will consider VQ study in the morning.     I personally performed the services described in this documentation, which was scribed in my presence. The recorded information has been reviewed and is accurate.      Ernestina Patches, MD 12/01/14 1049

## 2014-11-30 NOTE — ED Notes (Signed)
Dr. Docherty at bedside.

## 2014-11-30 NOTE — H&P (Signed)
PULMONARY / CRITICAL CARE MEDICINE   Name: Brittney Thomas MRN: [id number] DOB: January 16, 1933    ADMISSION DATE:  11/29/2014   REFERRING MD :EDP  CHIEF COMPLAINT:  Choking  INITIAL PRESENTATION: Hypoxia from choking on pill.  STUDIES:    SIGNIFICANT EVENTS: 5/31 admit to ER with choking on a pill.   HISTORY OF PRESENT ILLNESS:  81 yo ,never smoker, with known adenocarcinoma of lung and mets, dx 10/15 and on iressa daily(Dr. Julien Nordmann) who was taking her oral meds 5/31 and choked on a pill. EMS was activated and she was treated with bronchodilators and steroids. She was noted to be hypoxic and was placed on heparin for presumed PE and Afib. Note in 11/15 she had rectus sheath hematoma and all anticoagulation was discontinued at that time. PCCM called today again for hypoxia, afrvr (on cardizem). She is awake and alert, will admit to ICU, stop heparin for now and most likely dc VQ scan.She remains a full code. We will admit and most likely she can go to sdu in 24 hours. NOTE SHE HAD IVC FILTER PLACED IN 4/16.  PAST MEDICAL HISTORY :   has a past medical history of Ulcer (06-08-13); Hypertension; Thyroid disease; Esophageal reflux; COPD (chronic obstructive pulmonary disease); Peripheral vascular disease; Hypothyroidism; Insomnia; Arthritis; Dysrhythmia; DVT (deep venous thrombosis) (2008); Paroxysmal a-fib (2008); PE (pulmonary thromboembolism) (2008); Wears dentures; Moderate to severe pulmonary hypertension; Acute and chronic respiratory failure with hypoxia (06/14/2014); Lung cancer, main bronchus (05/03/2014); Adenocarcinoma, lung (04/15/2014); Hemorrhagic shock (05/27/2014); and Spontaneous hemorrhage.  has past surgical history that includes Tonsillectomy; Eye surgery (Right, Aug. 2014); Eye surgery (Left, Oct. 2014); Abdominal hysterectomy (1984); Video bronchoscopy with endobronchial navigation (N/A, 04/20/2014); and Video bronchoscopy with endobronchial ultrasound (N/A, 04/20/2014). Prior to  Admission medications   Medication Sig Start Date End Date Taking? Authorizing Provider  acetaminophen (TYLENOL) 500 MG tablet Take 500 mg by mouth every 6 (six) hours as needed (pain).    Historical Provider, MD  aspirin EC 81 MG tablet Take 1 tablet (81 mg total) by mouth daily. 09/06/14   Radene Gunning, NP  benzonatate (TESSALON) 100 MG capsule Take 1 capsule (100 mg total) by mouth 3 (three) times daily. 09/06/14   Radene Gunning, NP  calcium-vitamin D (OSCAL WITH D) 500-200 MG-UNIT per tablet Take 1 tablet by mouth daily with breakfast.     Historical Provider, MD  feeding supplement, ENSURE COMPLETE, (ENSURE COMPLETE) LIQD Take 237 mLs by mouth 2 (two) times daily between meals. 06/06/14   Erick Colace, NP  folic acid (FOLVITE) 1 MG tablet Take 1 tablet (1 mg total) by mouth daily. 05/03/14   Curt Bears, MD  guaiFENesin-dextromethorphan [facility] DM) 100-10 MG/5ML syrup Take 5 mLs by mouth every 4 (four) hours as needed for cough. 09/06/14   Radene Gunning, NP  HYDROcodone-acetaminophen (NORCO/VICODIN) 5-325 MG per tablet Take 1 tablet by mouth every 6 (six) hours as needed for moderate pain. 09/06/14   Radene Gunning, NP  ipratropium-albuterol (DUONEB) 0.5-2.5 (3) MG/3ML SOLN Take 3 mLs by nebulization every 6 (six) hours as needed. 09/06/14   Radene Gunning, NP  IRESSA 250 MG tablet TAKE 1 TABLET BY MOUTH ONCE DAILY 09/27/14   Curt Bears, MD  levofloxacin (LEVAQUIN) 500 MG tablet Take 1 tablet (500 mg total) by mouth daily. 09/06/14   Radene Gunning, NP  levothyroxine (SYNTHROID, LEVOTHROID) 88 MCG tablet Take 88 mcg by mouth daily before breakfast.    Historical Provider, MD  metoprolol tartrate (LOPRESSOR) 25 MG tablet Take 0.5 tablets (12.5 mg total) by mouth 2 (two) times daily. 07/07/14 08/04/14  Florencia Reasons, MD  Omega-3 Fatty Acids (SALMON OIL-1000 PO) Take 1 tablet by mouth daily.     Historical Provider, MD  predniSONE (DELTASONE) 10 MG tablet Take 4 tabs on 09/06/14 then take 3 tabs 09/07/14 then  take 2 tabs 09/08/14 then take 1 tab 09/09/14 then stop. 09/06/14   Radene Gunning, NP  temazepam (RESTORIL) 7.5 MG capsule Take 7.5 mg by mouth at bedtime as needed for sleep.    Historical Provider, MD   Allergies  Allergen Reactions  . Poison Ivy Extract [Extract Of Poison Ivy] Rash  . Sulfa Antibiotics Rash  . Sulfacetamide Sodium Rash    FAMILY HISTORY:  indicated that her mother is deceased. She indicated that her father is deceased.  SOCIAL HISTORY:  reports that she has never smoked. She has never used smokeless tobacco. She reports that she does not drink alcohol or use illicit drugs.  REVIEW OF SYSTEMS:   10 point review of system taken, please see HPI for positives and negatives.   SUBJECTIVE:   VITAL SIGNS: Temp:  [97.5 F (36.4 C)-98 F (36.7 C)] 98 F (36.7 C) (06/01 0703) Pulse Rate:  [63-150] 109 (06/01 0745) Resp:  [16-26] 20 (06/01 0745) BP: (78-131)/(43-94) 95/62 mmHg (06/01 0745) SpO2:  [81 %-100 %] 98 % (06/01 0745) HEMODYNAMICS:   VENTILATOR SETTINGS:   INTAKE / OUTPUT:  Intake/Output Summary (Last 24 hours) at 11/30/14 0756 Last data filed at 11/30/14 0655  Gross per 24 hour  Intake    750 ml  Output      0 ml  Net    750 ml    PHYSICAL EXAMINATION: General: Awake and alert, disheveled, frail WF  Neuro:  Intact HEENT:   No JVD/LAN, Oral mucosa dry, PERL Cardiovascular: HSIR IR A fib 78 Lungs: Diminished in bases Abdomen: Soft NT + BS Musculoskeletal:  Left knee chronic ecchymosis  Skin:  Warm and dry ++ lower edema  LABS:  CBC  Recent Labs Lab 11/30/14 0015  WBC 4.2  HGB 12.9  HCT 40.4  PLT 164   Coag's  Recent Labs Lab 11/30/14 0015  INR 2.23*   BMET  Recent Labs Lab 11/30/14 0015  NA 141  K 4.3  CL 95*  CO2 30  BUN 32*  CREATININE 1.34*  GLUCOSE 131*   Electrolytes  Recent Labs Lab 11/30/14 0015  CALCIUM 9.2   Sepsis Markers  Recent Labs Lab 11/30/14 0325  LATICACIDVEN 2.27*   ABG  Recent  Labs Lab 11/30/14 0334  PHART 7.306*  PCO2ART 70.0*  PO2ART 106.0*   Liver Enzymes No results for input(s): AST, ALT, ALKPHOS, BILITOT, ALBUMIN in the last 168 hours. Cardiac Enzymes No results for input(s): TROPONINI, PROBNP in the last 168 hours. Glucose No results for input(s): GLUCAP in the last 168 hours.  Imaging US Renal Port  11/30/2014   CLINICAL DATA:  Acute kidney injury. History of hypertension, lung cancer.  EXAM: RENAL / URINARY TRACT ULTRASOUND COMPLETE  COMPARISON:  CT of the chest, abdomen and pelvis August 31, 2004  FINDINGS: Right Kidney:  Length: 10.3 cm. Increased cortical echogenicity. No mass or hydronephrosis visualized.  Left Kidney:  Length: 10 cm. Increased cortical echogenicity. No mass or hydronephrosis visualized.  Bladder:  Appears normal for degree of bladder distention. Small amount of ascites seen in the abdomen.  IMPRESSION: Echogenic kidneys can be seen  with medical renal disease without obstructive uropathy.  At least mild amount of ascites.   Electronically Signed   By: Elon Alas M.D.   On: 11/30/2014 06:02   Dg Chest Port 1 View  11/30/2014   CLINICAL DATA:  Shortness of breath after choking on medication tonight. History of lung cancer.  EXAM: PORTABLE CHEST - 1 VIEW  COMPARISON:  Radiographs 09/04/2014.  CT 09/01/2014  FINDINGS: Cardiomegaly is unchanged. Decreased vascular congestion from prior. The left upper lobe pulmonary nodule on prior CT is not seen radiographically. There is no consolidation, large pleural effusion or pneumothorax. No evident pneumomediastinum. Osseous structures are stable.  IMPRESSION: 1. Decreased vascular congestion.  Stable cardiomegaly. 2. No acute process.   Electronically Signed   By: Jeb Levering M.D.   On: 11/30/2014 01:18     ASSESSMENT / PLAN:  PULMONARY OETT A: Chronic PE, IVC filter placed in  4/16 Hypoxia secondary to aspiration PHTN chronic P:   DC heparin and VQ scan->done O2 as  needed BD Make need FOB Admit to ICU  CARDIOVASCULAR CVL  A:  CAF Hypotension Chronic PE Chronic DVT P:  Fluid resuscitation No anticoagulation(hx of blood loss anemia) BB DC IV Cardizem    RENAL A: Elevated creatine Suspected dehydration P:   Gentle hydration Follow creatine  GASTROINTESTINAL A:   GI protection Suspected dysphagia  P:   PPI Swallow eval  HEMATOLOGIC  Recent Labs  11/30/14 0015  HGB 12.9    A:   Hx of blood loss anemia P:  Follow cbc  INFECTIOUS A:   No apparent infection P:   BCx2 6/1>> UC 6/1>> Sputum Abx: hold for now with recent c dif  ENDOCRINE A:   Suspected adrenal suppression from chronic steroids    Hypothyroid  P:   Solumedrol iv  Synthroid TSH  NEUROLOGIC A:   No acute process P:   RASS goal: 0 Avoid oversedation   FAMILY  - Updates:   - Inter-disciplinary family meet or Palliative Care meeting due by:  day 7    TODAY'S SUMMARY:  81 yo ,never smoker, with known adenocarcinoma of lung and mets, dx 10/15 and on iressa daily(Dr. Julien Nordmann) who was taking her oral meds 5/31 and choked on a pill. EMS was activated and she was treated with bronchodilators and steroids. She was noted to be hypoxic and was placed on heparin for presumed PE and Afib. Note in 11/15 she had rectus sheath hematoma and all anticoagulation was discontinued at that time. PCCM called today again for hypoxia, afrvr (on cardizem). She is awake and alert, will admit to ICU, stop heparin for now and most likely dc VQ scan.She remains a full code. We will admit and most likely she can go to sdu in 24 hours. NOTE SHE HAD IVC FILTER PLACED IN 4/16.   Richardson Landry Minor ACNP Maryanna Shape PCCM Pager [phone] till 3 pm If no answer page [phone] 11/30/2014, 3:64 AM  81 year old female with stage 4 lung cancer and previous history of PE who presents after aspirating a pill evidently.  Desaturating, requiring 100% NRB.  Lungs are clear on exam.  Had a  filter placed in April after anticoagulation resulted in a rectus sheath hematoma so anti-coagulation is contraindicated at this point.  Doubt another PE.  V/Q scan will be positive since recent PE and will not alter management.  Will D/C.  Admit to the ICU for observation overnight.  The patient is critically ill with multiple organ  systems failure and requires high complexity decision making for assessment and support, frequent evaluation and titration of therapies, application of advanced monitoring technologies and extensive interpretation of multiple databases.   Critical Care Time devoted to patient care services described in this note is  35  Minutes. This time reflects time of care of this signee Dr Jennet Maduro. This critical care time does not reflect procedure time, or teaching time or supervisory time of PA/NP/Med student/Med Resident etc but could involve care discussion time.  Rush Farmer, M.D. [facility] Pulmonary/Critical Care Medicine. Pager: [phone]. After hours pager: [phone].

## 2014-11-30 NOTE — ED Notes (Signed)
CCPA at bedside.

## 2014-11-30 NOTE — Progress Notes (Signed)
Notified MD Mungal in regards to repeat lactic acid of 4.9. MD ordered to give 500 cc bolus and will repeat labs in AM 6/2.  Will continue to monitor and assess.

## 2014-11-30 NOTE — ED Notes (Signed)
Verbal order obtained for 533m bolus for pt's blood pressure

## 2014-11-30 NOTE — ED Notes (Addendum)
Dr.Docherty aware of VS; 234m bolus given. Pt changed from [place] to NRB at 10L for sats 81-83%. Pt resting with eyes closed; easy to arouse; A&Ox4.

## 2014-11-30 NOTE — ED Notes (Signed)
Pt in doppler

## 2014-11-30 NOTE — H&P (Addendum)
[facility] History and Physical  Brittney Thomas FGH:[id number] DOB: 29-Aug-1932 DOA: 11/29/2014  Referring physician: ED physician PCP: [name], MD  Specialists:   Chief Complaint: Shortness of breath.  HPI: Brittney Thomas is a 81 y.o. female with PMH of DVT, pulmonary embolism (anticoagulates was discontinued due to rectus bleeding), atrial fibrillation, history of lung cancer 2015, chronic kidney disease-stage II, COPD, PVD, hypertension, GERD, hypothyroidism, who presents with SOB.  Patient reports that she chocked when she took her home med last night, then starting having severe SOB.  Family reported similar incident in the past and pt had a PE. Initial sats 78% on EMS arrival with tachycardia. When I saw patient in the emergency room, she is lethargic, and denies cough, chest pain, fever or chills, but has SOB.  Currently patient denies fever, chills, running nose, ear pain, headaches, cough, chest pain, abdominal pain, diarrhea, constipation, dysuria, urgency, frequency, hematuria, skin rashes. No unilateral weakness, numbness or tingling sensations. No vision change or hearing loss.  In ED, patient was found to have pCO2 71, pH 7.306, PO2 106 on ABG. WBC 4.2, temperature normal, tachycardia, AoCKD-II, troponin negative, BNP 477, elevated d-dimer. chest x-ray has no pneumonia. Patient is admitted to inpatient for further evaluation and treatment. PCCM was consulted.  Where does patient live?   At home    Can patient participate in ADLs? Barely   Review of Systems:   General: no fevers, chills, no changes in body weight, has fatigue HEENT: no blurry vision, hearing changes or sore throat Pulm: has dyspnea, no coughing, wheezing CV: no chest pain, palpitations Abd: no nausea, vomiting, abdominal pain, diarrhea, constipation GU: no dysuria, burning on urination, increased urinary frequency, hematuria  Ext: mild leg leg edema. Neuro: no unilateral weakness, numbness, or  tingling, no vision change or hearing loss Skin: no rash MSK: No muscle spasm, no deformity, no limitation of range of movement in spin Heme: No easy bruising.  Travel history: No recent long distant travel.  Allergy:  Allergies  Allergen Reactions  . Poison Ivy Extract [Extract Of Poison Ivy] Rash  . Sulfa Antibiotics Rash  . Sulfacetamide Sodium Rash    Past Medical History  Diagnosis Date  . Ulcer 06-08-13    Left medial ankle  . Hypertension   . Thyroid disease     Hypo-Thyroidism  . Esophageal reflux   . COPD (chronic obstructive pulmonary disease)   . Peripheral vascular disease   . Hypothyroidism   . Insomnia     takes lorazepam  . Arthritis   . Dysrhythmia   . DVT (deep venous thrombosis) 2008  . Paroxysmal a-fib 2008    Anticoag stopped due to rectus sheath bleed  . PE (pulmonary thromboembolism) 2008  . Wears dentures   . Moderate to severe pulmonary hypertension   . Acute and chronic respiratory failure with hypoxia 06/14/2014    December 2015 2 L of oxygen continuously   . Lung cancer, main bronchus 05/03/2014    LUL, non small cell   . Adenocarcinoma, lung 04/15/2014    EBUS 04/20/14 >NON small cell carcinoma , adenocarcinoma ;LEFT >refer to Oncology     . Hemorrhagic shock 05/27/2014  . Spontaneous hemorrhage     Rectus sheath hematoma, while on anticoag    Past Surgical History  Procedure Laterality Date  . Tonsillectomy    . Eye surgery Right Aug. 2014    Cataract  . Eye surgery Left Oct. 2014    Cataract  .  Abdominal hysterectomy  1984    Total  . Video bronchoscopy with endobronchial navigation N/A 04/20/2014    Procedure: VIDEO BRONCHOSCOPY WITH ENDOBRONCHIAL NAVIGATION;  Surgeon: Collene Gobble, MD;  Location: [facility];  Service: Thoracic;  Laterality: N/A;  . Video bronchoscopy with endobronchial ultrasound N/A 04/20/2014    Procedure: VIDEO BRONCHOSCOPY WITH ENDOBRONCHIAL ULTRASOUND;  Surgeon: Collene Gobble, MD;  Location: [facility];  Service:  Thoracic;  Laterality: N/A;    Social History:  reports that she has never smoked. She has never used smokeless tobacco. She reports that she does not drink alcohol or use illicit drugs.  Family History:  Family History  Problem Relation Age of Onset  . Heart disease Maternal Grandmother   . Cancer Brother     kidney  . Stroke Brother      Prior to Admission medications   Medication Sig Start Date End Date Taking? Authorizing Provider  acetaminophen (TYLENOL) 500 MG tablet Take 500 mg by mouth every 6 (six) hours as needed (pain).    Historical Provider, MD  aspirin EC 81 MG tablet Take 1 tablet (81 mg total) by mouth daily. 09/06/14   Radene Gunning, NP  benzonatate (TESSALON) 100 MG capsule Take 1 capsule (100 mg total) by mouth 3 (three) times daily. 09/06/14   Radene Gunning, NP  calcium-vitamin D (OSCAL WITH D) 500-200 MG-UNIT per tablet Take 1 tablet by mouth daily with breakfast.     Historical Provider, MD  feeding supplement, ENSURE COMPLETE, (ENSURE COMPLETE) LIQD Take 237 mLs by mouth 2 (two) times daily between meals. 06/06/14   Erick Colace, NP  folic acid (FOLVITE) 1 MG tablet Take 1 tablet (1 mg total) by mouth daily. 05/03/14   Curt Bears, MD  guaiFENesin-dextromethorphan [facility] DM) 100-10 MG/5ML syrup Take 5 mLs by mouth every 4 (four) hours as needed for cough. 09/06/14   Radene Gunning, NP  HYDROcodone-acetaminophen (NORCO/VICODIN) 5-325 MG per tablet Take 1 tablet by mouth every 6 (six) hours as needed for moderate pain. 09/06/14   Radene Gunning, NP  ipratropium-albuterol (DUONEB) 0.5-2.5 (3) MG/3ML SOLN Take 3 mLs by nebulization every 6 (six) hours as needed. 09/06/14   Radene Gunning, NP  IRESSA 250 MG tablet TAKE 1 TABLET BY MOUTH ONCE DAILY 09/27/14   Curt Bears, MD  levofloxacin (LEVAQUIN) 500 MG tablet Take 1 tablet (500 mg total) by mouth daily. 09/06/14   Radene Gunning, NP  levothyroxine (SYNTHROID, LEVOTHROID) 88 MCG tablet Take 88 mcg by mouth daily before  breakfast.    Historical Provider, MD  metoprolol tartrate (LOPRESSOR) 25 MG tablet Take 0.5 tablets (12.5 mg total) by mouth 2 (two) times daily. 07/07/14 08/04/14  Florencia Reasons, MD  Omega-3 Fatty Acids (SALMON OIL-1000 PO) Take 1 tablet by mouth daily.     Historical Provider, MD  predniSONE (DELTASONE) 10 MG tablet Take 4 tabs on 09/06/14 then take 3 tabs 09/07/14 then take 2 tabs 09/08/14 then take 1 tab 09/09/14 then stop. 09/06/14   Radene Gunning, NP  temazepam (RESTORIL) 7.5 MG capsule Take 7.5 mg by mouth at bedtime as needed for sleep.    Historical Provider, MD    Physical Exam: Filed Vitals:   11/30/14 0541 11/30/14 0545 11/30/14 0600 11/30/14 0615  BP:  '88/60 90/62 85/44 '$  Pulse:  129 150 125  Temp:      TempSrc:      Resp:  '20 19 18  '$ SpO2: 98% 95% 96%  97%   General: Not in acute distress HEENT:       Eyes: PERRL, EOMI, no scleral icterus.       ENT: No discharge from the ears and nose, no pharynx injection, no tonsillar enlargement.        Neck: No JVD, no bruit, no mass felt. Heme: No neck lymph node enlargement. Cardiac: S1/S2, RRR, tachycardia No murmurs, No gallops or rubs. Pulm: Decreased air movement bilaterally. Has mild rhonchi and mild wheezing, No rales or or rubs. Abd: Soft, nondistended, nontender, no rebound pain, no organomegaly, BS present. Ext: trace pitting leg edema bilaterally. Chronic venous insufficiency change in both legs. 2+DP/PT pulse bilaterally. Musculoskeletal: No joint deformities, No joint redness or warmth, no limitation of ROM in spin. Skin: No rashes.  Neuro: Alert, oriented X3, cranial nerves II-XII grossly intact, muscle strength 5/5 in all extremities, sensation to light touch intact.  Psych: Patient is not psychotic, no suicidal or hemocidal ideation.  Labs on Admission:  Basic Metabolic Panel:  Recent Labs Lab 11/30/14 0015  NA 141  K 4.3  CL 95*  CO2 30  GLUCOSE 131*  BUN 32*  CREATININE 1.34*  CALCIUM 9.2   Liver Function Tests: No  results for input(s): AST, ALT, ALKPHOS, BILITOT, PROT, ALBUMIN in the last 168 hours. No results for input(s): LIPASE, AMYLASE in the last 168 hours. No results for input(s): AMMONIA in the last 168 hours. CBC:  Recent Labs Lab 11/30/14 0015  WBC 4.2  HGB 12.9  HCT 40.4  MCV 107.2*  PLT 164   Cardiac Enzymes: No results for input(s): CKTOTAL, CKMB, CKMBINDEX, TROPONINI in the last 168 hours.  BNP (last 3 results)  Recent Labs  06/26/14 2353 11/30/14 0015  BNP 620.7* 477.8*    ProBNP (last 3 results) No results for input(s): PROBNP in the last 8760 hours.  CBG: No results for input(s): GLUCAP in the last 168 hours.  Radiological Exams on Admission: US Renal Port  11/30/2014   CLINICAL DATA:  Acute kidney injury. History of hypertension, lung cancer.  EXAM: RENAL / URINARY TRACT ULTRASOUND COMPLETE  COMPARISON:  CT of the chest, abdomen and pelvis August 31, 2004  FINDINGS: Right Kidney:  Length: 10.3 cm. Increased cortical echogenicity. No mass or hydronephrosis visualized.  Left Kidney:  Length: 10 cm. Increased cortical echogenicity. No mass or hydronephrosis visualized.  Bladder:  Appears normal for degree of bladder distention. Small amount of ascites seen in the abdomen.  IMPRESSION: Echogenic kidneys can be seen with medical renal disease without obstructive uropathy.  At least mild amount of ascites.   Electronically Signed   By: Elon Alas M.D.   On: 11/30/2014 06:02   Dg Chest Port 1 View  11/30/2014   CLINICAL DATA:  Shortness of breath after choking on medication tonight. History of lung cancer.  EXAM: PORTABLE CHEST - 1 VIEW  COMPARISON:  Radiographs 09/04/2014.  CT 09/01/2014  FINDINGS: Cardiomegaly is unchanged. Decreased vascular congestion from prior. The left upper lobe pulmonary nodule on prior CT is not seen radiographically. There is no consolidation, large pleural effusion or pneumothorax. No evident pneumomediastinum. Osseous structures are stable.   IMPRESSION: 1. Decreased vascular congestion.  Stable cardiomegaly. 2. No acute process.   Electronically Signed   By: Jeb Levering M.D.   On: 11/30/2014 01:18    EKG: Independently reviewed.  Abnormal findings: Atrial fibrillation Assessment/Plan Principal Problem:   SOB (shortness of breath) Active Problems:   Chronic venous insufficiency   Lung  cancer, main bronchus   Acute and chronic respiratory failure with hypoxia   Benign essential HTN   Chronic atrial fibrillation   Moderate to severe pulmonary hypertension   Essential hypertension, benign   Other specified hypothyroidism   COPD exacerbation   Acute on chronic kidney failure   History of pulmonary embolism   Hypoxia   SOB (shortness of breath): Potential differential diagnosis includes pulmonary embolism (patient has history of DVT and pulmonary embolism, not on anticoagulant, also has lung adenocarcinoma, at high risk for PE) and COPD exacerbation (patient has mild wheezing and rhonchi, consistent with COPD exacerbation, but is not consistent with her severe shortness of breath and soft blood pressure). It is highly likely that patient has a new PE. Although, patient had IVC filter placement, there still is a possibility to have a small thrombi which are capable of passing through patent filters or through collaterals around obstructed filters; furthermore, direct thrombus extension can occur through the filter itself leading to recurrent pulmonary embolism according to the Upt[employer] literature (proximately 2 to 4 percent). This is very difficult situation where patient's anticoagulant was previously discontinued due to hx of rectus sheath bleeding. I think the bleeding can be monitored closely and treated with transfusion in case it happens again if start heparin now.   -will admit to sdu -start IV heparin, pending V/Q scan -consulted PCCM, need to follow up recommendations. -get 2d echo and LE doppler -bolus NS as needed to  soft bp  COPD exacerbation:  -Nebulizers: scheduled Duoneb and prn xopenex -Solu-Medrol 60 mg IV daily  -Oral azithromycin for 5 days.  -Urine legionella and S. pneumococcal antigen -Follow up blood culture x2, sputum culture  Lung adenocarcinoma, Stage IV (T1a, N3, M1b): She has been followed up by Dr. Julien Nordmann. She was treated systemic chemotherapy, but discontinued secondary to intolerance. -will continue gefitinib, IRESSA  -follow up with her oncologist  Atrial Fibrillation: CHA2DS2-VASc Score is 3, needs oral anticoagulation, but can not continue due to rectus sheath bleeding. Now presents with possible PE -on Heparin gtt -continue metoprolol  Benign essential HTN: now with soft BP -on metoprolol for HR control  Hypothyroidism: Last TSH was 1.653 on 09/04/14. -Continue home Synthroid  Acute on chronic kidney failure: Baseline creatinine 1.1-1.2, her creatinine is 1.34, BUN 32, likely due to prerenal failure. -IVF: ns 100 cc/h -Check FeUrea -US-renal -Follow up renal function by BMP  DVT ppx: on IV Heparin    Code Status: Full code Family Communication: None at bed side.     Disposition Plan: Admit to inpatient   Date of Service 11/30/2014    Ivor Costa [facility] Pager [phone]  If 7PM-7AM, please contact night-coverage [email or web address] Password [facility] 11/30/2014, 6:49 AM

## 2014-12-01 DIAGNOSIS — N179 Acute kidney failure, unspecified: Secondary | ICD-10-CM

## 2014-12-01 DIAGNOSIS — C3490 Malignant neoplasm of unspecified part of unspecified bronchus or lung: Secondary | ICD-10-CM | POA: Diagnosis present

## 2014-12-01 DIAGNOSIS — R0989 Other specified symptoms and signs involving the circulatory and respiratory systems: Secondary | ICD-10-CM

## 2014-12-01 LAB — LEGIONELLA ANTIGEN, URINE

## 2014-12-01 LAB — BASIC METABOLIC PANEL
Anion gap: 13 (ref 5–15)
BUN: 34 mg/dL — ABNORMAL HIGH (ref 6–20)
CALCIUM: 8.8 mg/dL — AB (ref 8.9–10.3)
CO2: 27 mmol/L (ref 22–32)
CREATININE: 1.18 mg/dL — AB (ref 0.44–1.00)
Chloride: 98 mmol/L — ABNORMAL LOW (ref 101–111)
GFR calc non Af Amer: 42 mL/min — ABNORMAL LOW (ref >60)
GFR, EST AFRICAN AMERICAN: 49 mL/min — AB (ref >60)
Glucose, Bld: 114 mg/dL — ABNORMAL HIGH (ref 65–99)
Potassium: 4.3 mmol/L (ref 3.5–5.1)
Sodium: 138 mmol/L (ref 135–145)

## 2014-12-01 LAB — CBC
HCT: 35.6 % — ABNORMAL LOW (ref 36.0–46.0)
HEMOGLOBIN: 11.7 g/dL — AB (ref 12.0–15.0)
MCH: 34.6 pg — AB (ref 26.0–34.0)
MCHC: 32.9 g/dL (ref 30.0–36.0)
MCV: 105.3 fL — AB (ref 78.0–100.0)
PLATELETS: 127 10*3/uL — AB (ref 150–400)
RBC: 3.38 MIL/uL — ABNORMAL LOW (ref 3.87–5.11)
RDW: 21 % — AB (ref 11.5–15.5)
WBC: 7.9 10*3/uL (ref 4.0–10.5)

## 2014-12-01 LAB — LACTIC ACID, PLASMA: LACTIC ACID, VENOUS: 4.4 mmol/L — AB (ref 0.5–2.0)

## 2014-12-01 LAB — UREA NITROGEN, URINE: UREA NITROGEN UR: 664 mg/dL

## 2014-12-01 MED ORDER — ENOXAPARIN SODIUM 40 MG/0.4ML ~~LOC~~ SOLN
40.0000 mg | SUBCUTANEOUS | Status: DC
  Administered 2014-12-01 – 2014-12-03 (×3): 40 mg via SUBCUTANEOUS
  Filled 2014-12-01 (×4): qty 0.4

## 2014-12-01 MED ORDER — MAGNESIUM SULFATE 2 GM/50ML IV SOLN
2.0000 g | Freq: Once | INTRAVENOUS | Status: AC
  Administered 2014-12-01: 2 g via INTRAVENOUS
  Filled 2014-12-01: qty 50

## 2014-12-01 MED ORDER — PANTOPRAZOLE SODIUM 40 MG PO TBEC
40.0000 mg | DELAYED_RELEASE_TABLET | Freq: Every day | ORAL | Status: DC
  Administered 2014-12-01 – 2014-12-08 (×8): 40 mg via ORAL
  Filled 2014-12-01 (×8): qty 1

## 2014-12-01 MED ORDER — DILTIAZEM HCL 100 MG IV SOLR
5.0000 mg/h | INTRAVENOUS | Status: DC
  Administered 2014-12-01 – 2014-12-02 (×2): 5 mg/h via INTRAVENOUS
  Filled 2014-12-01 (×3): qty 100

## 2014-12-01 MED ORDER — CETYLPYRIDINIUM CHLORIDE 0.05 % MT LIQD
7.0000 mL | Freq: Two times a day (BID) | OROMUCOSAL | Status: DC
  Administered 2014-12-01 – 2014-12-09 (×14): 7 mL via OROMUCOSAL

## 2014-12-01 MED ORDER — BOOST PLUS PO LIQD
237.0000 mL | Freq: Every day | ORAL | Status: DC
  Administered 2014-12-01 – 2014-12-09 (×9): 237 mL via ORAL
  Filled 2014-12-01 (×10): qty 237

## 2014-12-01 NOTE — H&P (Addendum)
PULMONARY / CRITICAL CARE MEDICINE   Name: Brittney Thomas MRN: [id number] DOB: 1933-01-15    ADMISSION DATE:  11/29/2014   REFERRING MD :EDP  CHIEF COMPLAINT:  Choking  INITIAL PRESENTATION:  81 yo ,never smoker, with known adenocarcinoma of lung and mets, dx 10/15 and on iressa daily(Dr. Julien Nordmann) who was taking her oral meds 5/31 and choked on a pill. EMS was activated and she was treated with bronchodilators and steroids. She was noted to be hypoxic and was placed on heparin for presumed PE and Afib. Note in 11/15 she had rectus sheath hematoma and all anticoagulation was discontinued at that time. PCCM called today again for hypoxia, afrvr (on cardizem). She is awake and alert, will admit to ICU, stop heparin for now and most likely dc VQ scan.She remains a full code. We will admit and most likely she can go to sdu in 24 hours. NOTE SHE HAD IVC FILTER PLACED IN 4/16.   SIGNIFICANT EVENTS: 5/31 admit to ER with choking on a pill.   SUBJECTIVE/OVERNIGHT/INTERVAL HX  12/01/14: On cardizem gtt - HR 93. Sitting and watching TV. Pharm reports she choked at home on tessalon cough perles. Per RN - speech saw her. THeir notes indicated d4 diet and plan for MBS.   Noted : low mag yesterday - not replaced. High lactate yesterday - fu today pending. Also per pharm - not on chronic steroids VITAL SIGNS: Temp:  [97.3 F (36.3 C)-97.6 F (36.4 C)] 97.6 F (36.4 C) (06/02 1224) Pulse Rate:  [40-133] 85 (06/02 1300) Resp:  [13-24] 22 (06/02 1300) BP: (87-112)/(44-78) 105/60 mmHg (06/02 1300) SpO2:  [90 %-100 %] 100 % (06/02 1300) Weight:  [78 kg (171 lb 15.3 oz)] 78 kg (171 lb 15.3 oz) (06/02 0446) HEMODYNAMICS:   VENTILATOR SETTINGS:   INTAKE / OUTPUT:  Intake/Output Summary (Last 24 hours) at 12/01/14 1409 Last data filed at 12/01/14 1300  Gross per 24 hour  Intake 1987.5 ml  Output    425 ml  Net 1562.5 ml    PHYSICAL EXAMINATION: General: Awake and alert, disheveled, frail WF ,  looks deconditioned Neuro:  Intact. Sitting in hciar. Watching TV HEENT:   No JVD/LAN, Oral mucosa dry, PERL Cardiovascular: HSIR IR A fib 92 on IV cardizem gtt Lungs: Diminished in bases Abdomen: Soft NT + BS Musculoskeletal:  Left knee chronic ecchymosis  Skin:  Warm and dry ++ lower edema  LABS: PULMONARY  Recent Labs Lab 11/30/14 0334 11/30/14 0857  PHART 7.306* 7.319*  PCO2ART 70.0* 63.1*  PO2ART 106.0* 82.0  HCO3 35.1* 32.4*  TCO2 37 34  O2SAT 97.0 95.0    CBC  Recent Labs Lab 11/30/14 0015 11/30/14 0900 12/01/14 0248  HGB 12.9 11.6* 11.7*  HCT 40.4 36.0 35.6*  WBC 4.2 4.1 7.9  PLT 164 125* 127*    COAGULATION  Recent Labs Lab 11/30/14 0015  INR 2.23*    CARDIAC  No results for input(s): TROPONINI in the last 168 hours. No results for input(s): PROBNP in the last 168 hours.   CHEMISTRY  Recent Labs Lab 11/30/14 0015 11/30/14 0900 12/01/14 0248  NA 141  --  138  K 4.3  --  4.3  CL 95*  --  98*  CO2 30  --  27  GLUCOSE 131*  --  114*  BUN 32*  --  34*  CREATININE 1.34*  --  1.18*  CALCIUM 9.2  --  8.8*  MG  --  1.6*  --  PHOS  --  4.9*  --    Estimated Creatinine Clearance: 40.9 mL/min (by C-G formula based on Cr of 1.18).   LIVER  Recent Labs Lab 11/30/14 0015  INR 2.23*     INFECTIOUS  Recent Labs Lab 11/30/14 0325 11/30/14 0900 11/30/14 1850  LATICACIDVEN 2.27* 3.4* 4.9*  PROCALCITON  --  0.13  --      ENDOCRINE CBG (last 3)   Recent Labs  11/30/14 0950  GLUCAP 142*         IMAGING x48h  - image(s) personally visualized  -   highlighted in bold US Renal Port  11/30/2014   CLINICAL DATA:  Acute kidney injury. History of hypertension, lung cancer.  EXAM: RENAL / URINARY TRACT ULTRASOUND COMPLETE  COMPARISON:  CT of the chest, abdomen and pelvis August 31, 2004  FINDINGS: Right Kidney:  Length: 10.3 cm. Increased cortical echogenicity. No mass or hydronephrosis visualized.  Left Kidney:  Length: 10 cm.  Increased cortical echogenicity. No mass or hydronephrosis visualized.  Bladder:  Appears normal for degree of bladder distention. Small amount of ascites seen in the abdomen.  IMPRESSION: Echogenic kidneys can be seen with medical renal disease without obstructive uropathy.  At least mild amount of ascites.   Electronically Signed   By: Elon Alas M.D.   On: 11/30/2014 06:02   Dg Chest Port 1 View  11/30/2014   CLINICAL DATA:  Shortness of breath after choking on medication tonight. History of lung cancer.  EXAM: PORTABLE CHEST - 1 VIEW  COMPARISON:  Radiographs 09/04/2014.  CT 09/01/2014  FINDINGS: Cardiomegaly is unchanged. Decreased vascular congestion from prior. The left upper lobe pulmonary nodule on prior CT is not seen radiographically. There is no consolidation, large pleural effusion or pneumothorax. No evident pneumomediastinum. Osseous structures are stable.  IMPRESSION: 1. Decreased vascular congestion.  Stable cardiomegaly. 2. No acute process.   Electronically Signed   By: Jeb Levering M.D.   On: 11/30/2014 01:18        ASSESSMENT / PLAN:  PULMONARY OETT A: Chronic PE, IVC filter placed in  4/16 Hypoxia secondary to aspiration PHTN chronic   - no resp distress. On 4L [place] with oulse ox 100%. Duplex LE DVT - reviewed -negatvief for DVT this admit  P:   Rx Old PE with IVC filter - not an anticoagulatin candidate till bleeding and faill risk issues sorted out  O2 as needed - titrate for pulse ox > 88% BD Too high risk for bronch - if unable to wean off O2, get CT chest looking for foreign body   CARDIOVASCULAR CVL  A:  CAF   - HR 96 on Cardizem gtt.   P:  No anticoagulation(hx of blood loss anemia) for now and for future - trh and pcp [name], MD to decie IV Cardizem   For A Fib  RENAL A: AKI  (baseline 0.9 to 1.'1mg'$ %) with lactic acidosis - Suspected dehydration   - creat nearing baseline. Repeat lactate pending today  P:   Gentle  hydration Await repeat lactic acid 12/01/2014 and 12/02/14 to decide on fluids Follow creatine  GASTROINTESTINAL A:   GI protection Suspected dysphagia Appears to have protein calorie malnutrition  P:   PPI Swallow eval - MBS needed D3 diet Speech following Needs nutn consult  HEMATOLOGIC   A:   Hx of blood loss anemia making anticoaglation difficult  - no bleeding currently  P:  Follow cbc  INFECTIOUS A:   No  apparent infection P:   BCx2 6/1>> UC 6/1>> Sputum Abx: hold for now with recent c dif  ENDOCRINE A:   Suspected adrenal suppression from chronic steroids   Per admit not 11/29/2014 Hypothyroid    12/01/14: per pharm not on chronic steroids  P:   Dc Solumedrol iv  Synthroid TSH  NEUROLOGIC A:   No acute process P:   RASS goal: 0 Avoid oversedation   ONC A: stage 4 lung cancer P [place] chemo iressa  FAMILY  - Updates: patient updated 12/01/2014  - Inter-disciplinary family meet or Palliative Care meeting due by:  day 7 - but will go out of ICU before that    TODAY'S SUMMARY: mvoe to tele 12/01/2014 and PCCM off from 12/02/14 with [facility] primary (d/w Dr Sherral Hammers). She needs goals of care      Dr. Brand Males, M.D., [facility]C.P Pulmonary and Critical Care Medicine Staff Physician [facility] Pager: [phone], If no answer or between  15:00h - 7:00h: call [phone]  12/01/2014 2:11 PM

## 2014-12-01 NOTE — Care Management Note (Signed)
Case Management Note  Patient Details  Name: Brittney Thomas MRN: [id number] Date of Birth: 1933/02/15  Subjective/Objective:     81 y.o. F admitted with SOB and Atrial Fibrillation. States she has lived with daughter since 01/2014. [facility] care has provided HHPT/RN/OT in past and she would like to continue their service if/when needed. She reports having w/c, RW, 3:1, BSC, tub bench at home.   PT recommending HHPT today and will re-eval on 6/3 for confirmation.               Action/Plan: Will continue to monitor for d/c needs.    Expected Discharge Date:   (pending)               Expected Discharge Plan:  [place] (has lived with daughter since 01/2014. )  In-House Referral:     Discharge planning Services  CM Consult  Post Acute Care Choice:    Choice offered to:  Patient  DME Arranged:    DME Agency:  [facility] (pt has used Nurse, learning disability in past and is pleased with care)  HH Arranged:    HH Agency:     Status of Service:  In process, will continue to follow  Medicare Important Message Given:    Date Medicare IM Given:    Medicare IM give by:    Date Additional Medicare IM Given:    Additional Medicare Important Message give by:     If discussed at [place] of Stay Meetings, dates discussed:    Additional Comments:  Delrae Sawyers, RN 12/01/2014, 3:07 PM

## 2014-12-01 NOTE — Evaluation (Signed)
Occupational Therapy Evaluation Patient Details Name: Brittney Thomas MRN: [id number] DOB: [birth year] Today's Date: 12/01/2014    History of Present Illness Pt is an 81 y/o female with a PMH of adenocarcinoma of lung and mets. Pt was taking oral medication and choked on a pill. When EMS arrived she was noted to be hypoxic and was placed on heparin for presumed PE and a-fib. Pt was admitted for further management.    Clinical Impression   Pt admitted with above. She demonstrates the below listed deficits and will benefit from continued OT to maximize safety and independence with BADLs.  Pt presents to OT with generalized weakness/deconditioning and impaired balance.  Currently, she requires min - mod A for ADLs.  Will follow acutely.  Recommend HHOT At discharge.       Follow Up Recommendations  Home health OT;Supervision/Assistance - 24 hour    Equipment Recommendations  None recommended by OT    Recommendations for Other Services       Precautions / Restrictions Precautions Precautions: Fall Restrictions Weight Bearing Restrictions: No      Mobility Bed Mobility               General bed mobility comments: siting in recliner  Transfers Overall transfer level: Needs assistance Equipment used: Rolling walker (2 wheeled) Transfers: Sit to/from [facility] Sit to Stand: Mod assist Stand pivot transfers: Min assist       General transfer comment: Pt requires mod A to power up from lower surface.  Min A for balance     Balance Overall balance assessment: Needs assistance Sitting-balance support: Feet supported Sitting balance-Leahy Scale: Good     Standing balance support: Bilateral upper extremity supported Standing balance-Leahy Scale: Poor Standing balance comment: reliant on bil. UE support                             ADL Overall ADL's : Needs assistance/impaired Eating/Feeding: Independent   Grooming: Wash/dry hands;Wash/dry  face;Oral care;Brushing hair;Set up;Sitting   Upper Body Bathing: Minimal assitance;Sitting   Lower Body Bathing: Moderate assistance;Sit to/from stand   Upper Body Dressing : Minimal assistance;Sitting   Lower Body Dressing: Moderate assistance;Sit to/from stand   Toilet Transfer: Minimal assistance;Ambulation;Comfort height toilet;RW   Toileting- Clothing Manipulation and Hygiene: Moderate assistance;Sit to/from stand       Functional mobility during ADLs: Minimal assistance;Rolling walker General ADL Comments: Pt fatigues requiring assistance      Vision     Perception     Praxis      Pertinent Vitals/Pain Pain Assessment: No/denies pain     Hand Dominance Right   Extremity/Trunk Assessment Upper Extremity Assessment Upper Extremity Assessment: Generalized weakness   Lower Extremity Assessment Lower Extremity Assessment: Defer to PT evaluation RLE Deficits / Details: Quads and hams slightly weaker during MMT vs. the left side.  LLE Deficits / Details: Pt reports her LLE is her "weak leg" that gives her trouble - apparently had a fall on her knee last August with a significant amount of swelling. Pt states she has "not been right since". Foot drop noted during MMT.   Cervical / Trunk Assessment Cervical / Trunk Assessment: Normal   Communication Communication Communication: No difficulties   Cognition Arousal/Alertness: Awake/alert Behavior During Therapy: WFL for tasks assessed/performed Overall Cognitive Status: Within Functional Limits for tasks assessed  General Comments       Exercises       Shoulder Instructions      Home Living Family/patient expects to be discharged to:: Private residence Living Arrangements: Children Available Help at Discharge: Family;Available PRN/intermittently Type of Home: House Home Access: Ramped entrance     Home Layout: One level     Bathroom Shower/Tub: Tub/shower unit Shower/tub  characteristics: Curtain Biochemist, clinical: Standard     Home Equipment: Environmental consultant - 2 wheels;Walker - 4 wheels;Bedside commode;Tub bench   Additional Comments: Lives with daughter who works during the day. Pt states they "make do" as best they can.      Prior Functioning/Environment Level of Independence: Needs assistance  Gait / Transfers Assistance Needed: Household ambulator mostly with a RW ADL's / Homemaking Assistance Needed: Needs assistance for dressing tasks and some bathing.         OT Diagnosis: Generalized weakness   OT Problem List: Decreased strength;Decreased activity tolerance;Impaired balance (sitting and/or standing);Decreased safety awareness;Decreased knowledge of use of DME or AE   OT Treatment/Interventions: Self-care/ADL training;DME and/or AE instruction;Therapeutic activities;Patient/family education;Balance training    OT Goals(Current goals can be found in the care plan section) Acute Rehab OT Goals Patient Stated Goal: to go home  OT Goal Formulation: With patient Time For Goal Achievement: 12/15/14 Potential to Achieve Goals: Good ADL Goals Pt Will Perform Grooming: with min guard assist;standing Pt Will Perform Upper Body Bathing: with set-up;sitting Pt Will Perform Lower Body Bathing: with min guard assist;sit to/from stand Pt Will Perform Upper Body Dressing: with set-up;sitting Pt Will Perform Lower Body Dressing: with min guard assist;sit to/from stand Pt Will Transfer to Toilet: with min guard assist;ambulating;regular height toilet;bedside commode;grab bars Pt Will Perform Toileting - Clothing Manipulation and hygiene: with min guard assist;sit to/from stand  OT Frequency: Min 2X/week   Barriers to D/C:            Co-evaluation              End of Session Equipment Utilized During Treatment: Surveyor, mining Communication: Mobility status  Activity Tolerance: Patient limited by fatigue Patient left: in chair;with call  bell/phone within reach   Time: 2924-4628 OT Time Calculation (min): 28 min Charges:  OT General Charges $OT Visit: 1 Procedure OT Evaluation $Initial OT Evaluation Tier I: 1 Procedure OT Treatments $Therapeutic Activity: 8-22 mins G-Codes:    Alayia Meggison M 12/17/14, 3:31 PM

## 2014-12-01 NOTE — Progress Notes (Signed)
Patient without DVT prophylaxis. No plans to resume Eliquis per discussion with Dr. Chase Caller. Due to cancer, will start Lovenox for DVT prophylaxis.   Sloan Leiter, PharmD, BCPS Clinical Pharmacist [phone] 12/01/2014, 2:29 PM

## 2014-12-01 NOTE — Progress Notes (Signed)
Initial Nutrition Assessment  DOCUMENTATION CODES:  Non-severe (moderate) malnutrition in context of chronic illness  INTERVENTION:   Snack at bedtime daily  Boost Plus PO daily, each supplement provides 360 kcal and 14 gm protein  NUTRITION DIAGNOSIS:  Malnutrition related to chronic illness as evidenced by severe depletion of muscle mass, moderate to severe fluid accumulation.   GOAL:  Patient will meet greater than or equal to 90% of their needs   MONITOR:  PO intake, Supplement acceptance, Weight trends  REASON FOR ASSESSMENT:  Consult Poor PO  ASSESSMENT: Patient dx with adenocarcinoma of lung and mets in October 2015. She was taking her oral meds 5/31 and choked on a pill. Brought to the hospital by EMS.   Patient says that her arms and legs are swollen today. She lost weight while in Rehab, but thinks she has gained it all back. She has lived with her daughter and son-in-law since August; they are both very good cooks and she has a good appetite and eats very well at their house. She drinks Boost supplements at home sometimes, requests no more than one per day.  Nutrition-Focused physical exam completed. Findings are mild-moderate fat depletion, severe muscle depletion, and moderate edema.   SLP following for swallowing difficulty; currently on a dysphagia 3 diet with thin liquids. Plans for a MBS tomorrow.    Height:  Ht Readings from Last 1 Encounters:  12/01/14 '5\' 5"'$  (1.651 m)    Weight:  Wt Readings from Last 1 Encounters:  12/01/14 171 lb 15.3 oz (78 kg)    Ideal Body Weight:  56.8 kg  Wt Readings from Last 10 Encounters:  12/01/14 171 lb 15.3 oz (78 kg)  11/07/14 146 lb 1.6 oz (66.271 kg)  09/06/14 189 lb 8 oz (85.957 kg)  08/04/14 180 lb 4.8 oz (81.784 kg)  07/07/14 185 lb 6.5 oz (84.1 kg)  06/16/14 189 lb 12.8 oz (86.093 kg)  06/06/14 182 lb 5.1 oz (82.7 kg)  05/16/14 152 lb 1.6 oz (68.992 kg)  05/03/14 154 lb 11.2 oz (70.171 kg)   04/29/14 161 lb 12.8 oz (73.392 kg)    BMI:  Body mass index is 28.62 kg/(m^2).  Estimated Nutritional Needs:  Kcal:  1600-1800  Protein:  90-105 gm  Fluid:  1.8 L  Skin:  Wound (see comment) (stage 2 pressure ulcer to sacrum)  Diet Order:  DIET DYS 3 Room service appropriate?: Yes; Fluid consistency:: Thin  EDUCATION NEEDS:  Education needs addressed   Intake/Output Summary (Last 24 hours) at 12/01/14 1556 Last data filed at 12/01/14 1400  Gross per 24 hour  Intake 1992.5 ml  Output    425 ml  Net 1567.5 ml    Last BM:  6/1   Molli Barrows, RD, LDN, [place] Pager [phone] After Hours Pager [phone]

## 2014-12-01 NOTE — Significant Event (Signed)
Patient claims that the tessalon pill are what makes her choke. She had choked on it at home prior to this admission and then again yesterday afternoon when she received it. Patient wants to hold off on getting this medication at this time. RN spoke with unit pharmacist for consideration of an alternative.

## 2014-12-01 NOTE — Progress Notes (Signed)
Patient's HR in 120s off cardizem gtt.  Transfer room location changed from telemetry to step down per Dr. Melina Schools.  Cardizem gtt restarted.  Will continue to assess and monitor.  Doran Clay, RN

## 2014-12-01 NOTE — Progress Notes (Signed)
OT Cancellation Note  Patient Details Name: Brittney Thomas MRN: [id number] DOB: [birth year]   Cancelled Treatment:    Reason Eval/Treat Not Completed: Medical issues which prohibited therapy - pt currently with strict bedrest orders.  Will initiate OT eval once medically stable and activity orders updated.   Darlina Rumpf [place], OTR/L [phone]  12/01/2014, 10:29 AM

## 2014-12-01 NOTE — Evaluation (Signed)
Physical Therapy Evaluation Patient Details Name: Brittney Thomas MRN: [id number] DOB: 12-27-1932 Today's Date: 12/01/2014   History of Present Illness  Pt is an 81 y/o female with a PMH of adenocarcinoma of lung and mets. Pt was taking oral medication and choked on a pill. When EMS arrived she was noted to be hypoxic and was placed on heparin for presumed PE and a-fib. Pt was admitted for further management.   Clinical Impression  Pt admitted with above diagnosis. Pt currently with functional limitations due to the deficits listed below (see PT Problem List). At the time of PT eval pt was able to perform transfers with mod assist for balance and support. Pt states that she has an easier time mobilizing at home as her seated surfaces are higher to stand from. Pt will benefit from skilled PT to increase their independence and safety with mobility to allow discharge to the venue listed below. Pt adamant that she wants to return home at d/c. Discussed possible need for SNF if pt does not show functional improvement next session, and pt was agreeable. CM updated on this plan. Will attempt to see pt again tomorrow as schedule allows to hopefully get a better idea for d/c plan.      Follow Up Recommendations Home health PT;Supervision/Assistance - 24 hour    Equipment Recommendations  None recommended by PT    Recommendations for Other Services       Precautions / Restrictions Precautions Precautions: Fall Restrictions Weight Bearing Restrictions: No      Mobility  Bed Mobility               General bed mobility comments: Pt was sitting up in the recliner chair upon PT arrival.   Transfers Overall transfer level: Needs assistance Equipment used: 1 person hand held assist (Held back of chair for support - RW not available. ) Transfers: Sit to/from Stand Sit to Stand: Mod assist         General transfer comment: Heavy mod assist was required to power-up to full standing position.  Increased time to gain and maintain balance while holding the back of a straight-backed chair for support.   Ambulation/Gait             General Gait Details: Further ambulation was deferred  Stairs            Wheelchair Mobility    Modified Rankin (Stroke Patients Only)       Balance Overall balance assessment: Needs assistance Sitting-balance support: Feet supported;No upper extremity supported Sitting balance-Leahy Scale: Fair     Standing balance support: Bilateral upper extremity supported;During functional activity Standing balance-Leahy Scale: Poor Standing balance comment: Pt requires UE support and occasional therapist assist to maintain standing balance.                              Pertinent Vitals/Pain Pain Assessment: No/denies pain    Home Living Family/patient expects to be discharged to:: Private residence Living Arrangements: Children Available Help at Discharge: Family;Available PRN/intermittently Type of Home: House Home Access: Ramped entrance     Home Layout: One level Home Equipment: Walker - 2 wheels;Walker - 4 wheels;Bedside commode;Shower seat Additional Comments: Lives with daughter who works during the day. Pt states they "make do" as best they can.    Prior Function Level of Independence: Needs assistance   Gait / Transfers Assistance Needed: Household ambulator mostly with a RW  ADL's /  Homemaking Assistance Needed: Needs assistance for dressing tasks and some bathing.         Hand Dominance   Dominant Hand: Right    Extremity/Trunk Assessment   Upper Extremity Assessment: Defer to OT evaluation           Lower Extremity Assessment: Generalized weakness;RLE deficits/detail;LLE deficits/detail RLE Deficits / Details: Quads and hams slightly weaker during MMT vs. the left side.  LLE Deficits / Details: Pt reports her LLE is her "weak leg" that gives her trouble - apparently had a fall on her knee last  August with a significant amount of swelling. Pt states she has "not been right since". Foot drop noted during MMT.     Communication   Communication: No difficulties  Cognition Arousal/Alertness: Awake/alert Behavior During Therapy: WFL for tasks assessed/performed Overall Cognitive Status: Within Functional Limits for tasks assessed                      General Comments      Exercises        Assessment/Plan    PT Assessment Patient needs continued PT services  PT Diagnosis Difficulty walking;Generalized weakness   PT Problem List Decreased strength;Decreased range of motion;Decreased activity tolerance;Decreased balance;Decreased mobility;Decreased knowledge of use of DME;Decreased knowledge of precautions;Decreased safety awareness;Cardiopulmonary status limiting activity  PT Treatment Interventions DME instruction;Gait training;Stair training;Functional mobility training;Therapeutic activities;Therapeutic exercise;Neuromuscular re-education;Patient/family education   PT Goals (Current goals can be found in the Care Plan section) Acute Rehab PT Goals Patient Stated Goal: Return home PT Goal Formulation: With patient Time For Goal Achievement: 12/08/14 Potential to Achieve Goals: Good    Frequency Min 3X/week   Barriers to discharge Decreased caregiver support Daughter is not home 24 hours, and pt states she has been having HH come out to assist her as well. Unsure how long ago this was.     Co-evaluation               End of Session Equipment Utilized During Treatment: Gait belt Activity Tolerance: Patient limited by fatigue Patient left: in chair;with call bell/phone within reach Nurse Communication: Mobility status         Time: 1884-1660 PT Time Calculation (min) (ACUTE ONLY): 21 min   Charges:   PT Evaluation $Initial PT Evaluation Tier I: 1 Procedure     PT G Codes:        Rolinda Roan 26-Dec-2014, 3:03 PM   Rolinda Roan, PT,  DPT Acute Rehabilitation Services Pager: [phone]

## 2014-12-01 NOTE — Significant Event (Signed)
Patient back in RVR A Fib, rates 120-140s. BP stable. Cardizem drip restarted at '5mg'$  @ 0830am.

## 2014-12-01 NOTE — Progress Notes (Signed)
Speech Language Pathology Treatment: Dysphagia  Patient Details Name: Brittney Thomas MRN: [id number] DOB: 10/30/1932 Today's Date: 12/01/2014 Time: 9150-5697 SLP Time Calculation (min) (ACUTE ONLY): 15 min  Assessment / Plan / Recommendation Clinical Impression  Pt seen for f/u observation after yesterday's assessment. Today pt observed to have delayed cough following thin liquids, possibly indicative of decreased airway protection/sensation, or possible this is just baseline cough. Pt continues to describe globus with pills despite pills being given whole in puree. Given that, per pt, this is the second hospital admission for aspiration with a pill, recommend objective testing to determine if there is any identifiable swallow impairment. Pt may continue current diet and precautions. Will complete MBS tomorrow.    HPI Other Pertinent Information: 81 yo, never smoker, with known adenocarcinoma of lung and mets, who was taking her oral meds 5/31 and choked on a pill. EMS was activated and she was treated with bronchodilators and steroids. She was noted to be hypoxic and was placed on heparin for presumed PE and Afib. Note in 11/15 she had rectus sheath hematoma and all anticoagulation was discontinued at that time. Pt reports intermittent difficulty at home swallowing her pills, although denies dysphagia to food/drink.   Pertinent Vitals    SLP Plan  MBS    Recommendations Diet recommendations: Dysphagia 3 (mechanical soft);Thin liquid Liquids provided via: Cup;Straw Medication Administration: Whole meds with puree Compensations: Slow rate;Small sips/bites              Oral Care Recommendations: Oral care BID Plan: MBS    GO    Herbie [place], MA CCC-SLP [phone]  Lynann Beaver 12/01/2014, 11:52 AM

## 2014-12-02 ENCOUNTER — Inpatient Hospital Stay (HOSPITAL_COMMUNITY): Payer: Medicare Other

## 2014-12-02 DIAGNOSIS — N179 Acute kidney failure, unspecified: Secondary | ICD-10-CM | POA: Insufficient documentation

## 2014-12-02 DIAGNOSIS — I82519 Chronic embolism and thrombosis of unspecified femoral vein: Secondary | ICD-10-CM

## 2014-12-02 DIAGNOSIS — I9589 Other hypotension: Secondary | ICD-10-CM | POA: Diagnosis present

## 2014-12-02 DIAGNOSIS — J9601 Acute respiratory failure with hypoxia: Secondary | ICD-10-CM | POA: Diagnosis present

## 2014-12-02 DIAGNOSIS — I2782 Chronic pulmonary embolism: Secondary | ICD-10-CM | POA: Diagnosis present

## 2014-12-02 DIAGNOSIS — E46 Unspecified protein-calorie malnutrition: Secondary | ICD-10-CM

## 2014-12-02 DIAGNOSIS — I272 Pulmonary hypertension, unspecified: Secondary | ICD-10-CM | POA: Diagnosis present

## 2014-12-02 DIAGNOSIS — E038 Other specified hypothyroidism: Secondary | ICD-10-CM

## 2014-12-02 DIAGNOSIS — N390 Urinary tract infection, site not specified: Secondary | ICD-10-CM

## 2014-12-02 DIAGNOSIS — I270 Primary pulmonary hypertension: Secondary | ICD-10-CM

## 2014-12-02 DIAGNOSIS — I420 Dilated cardiomyopathy: Secondary | ICD-10-CM

## 2014-12-02 LAB — URINE CULTURE: Colony Count: >=100000

## 2014-12-02 LAB — BASIC METABOLIC PANEL
Anion gap: 10 (ref 5–15)
BUN: 44 mg/dL — AB (ref 6–20)
CHLORIDE: 97 mmol/L — AB (ref 101–111)
CO2: 28 mmol/L (ref 22–32)
Calcium: 9.1 mg/dL (ref 8.9–10.3)
Creatinine, Ser: 1.32 mg/dL — ABNORMAL HIGH (ref 0.44–1.00)
GFR calc non Af Amer: 37 mL/min — ABNORMAL LOW (ref >60)
GFR, EST AFRICAN AMERICAN: 43 mL/min — AB (ref >60)
Glucose, Bld: 118 mg/dL — ABNORMAL HIGH (ref 65–99)
POTASSIUM: 4.1 mmol/L (ref 3.5–5.1)
SODIUM: 135 mmol/L (ref 135–145)

## 2014-12-02 LAB — CBC
HEMATOCRIT: 34.5 % — AB (ref 36.0–46.0)
Hemoglobin: 11.3 g/dL — ABNORMAL LOW (ref 12.0–15.0)
MCH: 34.5 pg — AB (ref 26.0–34.0)
MCHC: 32.8 g/dL (ref 30.0–36.0)
MCV: 105.2 fL — AB (ref 78.0–100.0)
PLATELETS: 136 10*3/uL — AB (ref 150–400)
RBC: 3.28 MIL/uL — AB (ref 3.87–5.11)
RDW: 20.8 % — AB (ref 11.5–15.5)
WBC: 8.8 10*3/uL (ref 4.0–10.5)

## 2014-12-02 LAB — HEPATIC FUNCTION PANEL
ALK PHOS: 119 U/L (ref 38–126)
ALT: 16 U/L (ref 14–54)
AST: 25 U/L (ref 15–41)
Albumin: 2.9 g/dL — ABNORMAL LOW (ref 3.5–5.0)
BILIRUBIN DIRECT: 0.4 mg/dL (ref 0.1–0.5)
Indirect Bilirubin: 0.4 mg/dL (ref 0.3–0.9)
TOTAL PROTEIN: 5.9 g/dL — AB (ref 6.5–8.1)
Total Bilirubin: 0.8 mg/dL (ref 0.3–1.2)

## 2014-12-02 LAB — MAGNESIUM: MAGNESIUM: 2.2 mg/dL (ref 1.7–2.4)

## 2014-12-02 LAB — PHOSPHORUS: PHOSPHORUS: 4.1 mg/dL (ref 2.5–4.6)

## 2014-12-02 LAB — GLUCOSE, CAPILLARY: Glucose-Capillary: 102 mg/dL — ABNORMAL HIGH (ref 65–99)

## 2014-12-02 LAB — LACTIC ACID, PLASMA: Lactic Acid, Venous: 2.7 mmol/L (ref 0.5–2.0)

## 2014-12-02 MED ORDER — SODIUM CHLORIDE 0.9 % IV BOLUS (SEPSIS)
500.0000 mL | Freq: Once | INTRAVENOUS | Status: AC
  Administered 2014-12-02: 500 mL via INTRAVENOUS

## 2014-12-02 MED ORDER — CEFTRIAXONE SODIUM IN DEXTROSE 20 MG/ML IV SOLN
1.0000 g | INTRAVENOUS | Status: DC
  Administered 2014-12-02: 1 g via INTRAVENOUS
  Filled 2014-12-02 (×3): qty 50

## 2014-12-02 NOTE — Progress Notes (Signed)
CRITICAL VALUE ALERT  Critical value received:  Lactic acid 2.7  Date of notification:  12/02/2014  Time of notification:  05:03  Critical value read back:Yes.    Nurse who received alert:  BShepherd, RN  MD notified (1st page):  Sommer  Time of first page:  05:10  MD notified (2nd page):  Time of second page:  Responding MD:  Oletta Darter  Time MD responded:  05:10

## 2014-12-02 NOTE — Progress Notes (Signed)
LCSW aware of consult for SNF. Reviewed Chart and due to patient insurance and PT/OT saying Home with Home health, insurance will not pay for a SNF stay. Patient needs a skilled need for SNF referral.   LCSW is following in case recommendations change or patient is not progressing.  Please call if needs arise.  Lane Hacker, MSW Clinical Social Work: Emergency Room [phone]

## 2014-12-02 NOTE — Progress Notes (Signed)
ANTIBIOTIC CONSULT NOTE - INITIAL  Pharmacy Consult for Ceftriaxone Indication: UTI  Allergies  Allergen Reactions  . Poison Ivy Extract [Extract Of Poison Ivy] Rash  . Sulfa Antibiotics Rash  . Sulfacetamide Sodium Rash    Patient Measurements: Height: '5\' 5"'$  (165.1 cm) Weight: 181 lb 10.5 oz (82.4 kg) IBW/kg (Calculated) : 57  Vital Signs: Temp: 97.8 F (36.6 C) (06/03 1144) Temp Source: Oral (06/03 1144) BP: 108/74 mmHg (06/03 1000) Pulse Rate: 102 (06/03 1000) Intake/Output from previous day: 06/02 0701 - 06/03 0700 In: 2680 [P.O.:720; I.V.:1910; IV Piggyback:50] Out: 80 [Urine:80] Intake/Output from this shift: Total I/O In: 240 [I.V.:240] Out: -   Labs:  Recent Labs  11/30/14 0015 11/30/14 0415 11/30/14 0900 12/01/14 0248 12/02/14 0345  WBC 4.2  --  4.1 7.9 8.8  HGB 12.9  --  11.6* 11.7* 11.3*  PLT 164  --  125* 127* 136*  LABCREA  --  84.79  --   --   --   CREATININE 1.34*  --   --  1.18* 1.32*   Estimated Creatinine Clearance: 36.1 mL/min (by C-G formula based on Cr of 1.32). No results for input(s): VANCOTROUGH, VANCOPEAK, VANCORANDOM, GENTTROUGH, GENTPEAK, GENTRANDOM, TOBRATROUGH, TOBRAPEAK, TOBRARND, AMIKACINPEAK, AMIKACINTROU, AMIKACIN in the last 72 hours.   Microbiology: Recent Results (from the past 720 hour(s))  Culture, blood (routine x 2) Call MD if unable to obtain prior to antibiotics being given     Status: None (Preliminary result)   Collection Time: 11/30/14  4:20 AM  Result Value Ref Range Status   Specimen Description BLOOD RIGHT FOREARM  Final   Special Requests BOTTLES DRAWN AEROBIC AND ANAEROBIC 5CC EA  Final   Culture   Final           BLOOD CULTURE RECEIVED NO GROWTH TO DATE CULTURE WILL BE HELD FOR 5 DAYS BEFORE ISSUING A FINAL NEGATIVE REPORT Performed at [facility]    Report Status PENDING  Incomplete  Culture, blood (routine x 2) Call MD if unable to obtain prior to antibiotics being given     Status: None  (Preliminary result)   Collection Time: 11/30/14  4:20 AM  Result Value Ref Range Status   Specimen Description BLOOD RIGHT FOREARM  Final   Special Requests BOTTLES DRAWN AEROBIC AND ANAEROBIC 5CC EA  Final   Culture   Final           BLOOD CULTURE RECEIVED NO GROWTH TO DATE CULTURE WILL BE HELD FOR 5 DAYS BEFORE ISSUING A FINAL NEGATIVE REPORT Performed at [facility]    Report Status PENDING  Incomplete  MRSA PCR Screening     Status: None   Collection Time: 11/30/14  9:50 AM  Result Value Ref Range Status   MRSA by PCR NEGATIVE NEGATIVE Final    Comment:        The GeneXpert MRSA Assay (FDA approved for NASAL specimens only), is one component of a comprehensive MRSA colonization surveillance program. It is not intended to diagnose MRSA infection nor to guide or monitor treatment for MRSA infections.   Urine culture     Status: None   Collection Time: 11/30/14 12:00 PM  Result Value Ref Range Status   Specimen Description URINE, RANDOM  Final   Special Requests NONE  Final   Colony Count   Final    >=100,000 COLONIES/ML Performed at [facility] Performed at [facility]    Report Status 12/02/2014 FINAL  Final   Organism ID, Bacteria KLEBSIELLA PNEUMONIAE  Final      Susceptibility   Klebsiella pneumoniae - MIC*    AMPICILLIN >=32 RESISTANT Resistant     CEFAZOLIN <=4 SENSITIVE Sensitive     CEFTRIAXONE <=1 SENSITIVE Sensitive     CIPROFLOXACIN <=0.25 SENSITIVE Sensitive     GENTAMICIN <=1 SENSITIVE Sensitive     LEVOFLOXACIN <=0.12 SENSITIVE Sensitive     NITROFURANTOIN 32 SENSITIVE Sensitive     TOBRAMYCIN <=1 SENSITIVE Sensitive     TRIMETH/SULFA <=20 SENSITIVE Sensitive     PIP/TAZO 8 SENSITIVE Sensitive     * KLEBSIELLA PNEUMONIAE    Medical History: Past Medical History  Diagnosis Date  . Ulcer 06-08-13    Left medial ankle  . Hypertension   . Thyroid disease     Hypo-Thyroidism   . Esophageal reflux   . COPD (chronic obstructive pulmonary disease)   . Peripheral vascular disease   . Hypothyroidism   . Insomnia     takes lorazepam  . Arthritis   . Dysrhythmia   . DVT (deep venous thrombosis) 2008  . Paroxysmal a-fib 2008    Anticoag stopped due to rectus sheath bleed  . PE (pulmonary thromboembolism) 2008  . Wears dentures   . Moderate to severe pulmonary hypertension   . Acute and chronic respiratory failure with hypoxia 06/14/2014    December 2015 2 L of oxygen continuously   . Lung cancer, main bronchus 05/03/2014    LUL, non small cell   . Adenocarcinoma, lung 04/15/2014    EBUS 04/20/14 >NON small cell carcinoma , adenocarcinoma ;LEFT >refer to Oncology     . Hemorrhagic shock 05/27/2014  . Spontaneous hemorrhage     Rectus sheath hematoma, while on anticoag    Medications:  Anti-infectives    Start     Dose/Rate Route Frequency Ordered Stop   12/02/14 1600  cefTRIAXone (ROCEPHIN) 1 g in dextrose 5 % 50 mL IVPB - Premix     1 g 100 mL/hr over 30 Minutes Intravenous Every 24 hours 12/02/14 1452     12/01/14 1000  azithromycin (ZITHROMAX) tablet 250 mg  Status:  Discontinued     250 mg Oral Daily 11/30/14 0408 11/30/14 0844   11/30/14 1000  azithromycin (ZITHROMAX) tablet 500 mg  Status:  Discontinued     500 mg Oral Daily 11/30/14 0408 11/30/14 0844     Assessment: 81 year old female admitted with a choking episode and hypoxia.  She is now to begin antibiotic therapy with Ceftriaxone for UTI.  Plan:  Ceftriaxone 1gm IV q24h As no dosing adjustments are anticipated pharmacy will sign off.  Thank you for the consult.  Would consider narrowing to Cephalexin '500mg'$  PO BID when ready for oral therapy.  Legrand Como, Pharm.D., BCPS, AAHIVP Clinical Pharmacist Phone: [phone] or [phone] 12/02/2014, 2:54 PM

## 2014-12-02 NOTE — Progress Notes (Signed)
At 1532 pt transferred from the unit via w/c.  A&0X4.  Oriented to room,  Denies pain.discomfort.  Call bell at reach.   Pt's daughter at bedside.  Call bell at reach.  Verbalized understanding.  Will continue to monitor.  Karie Kirks, Therapist, sports.

## 2014-12-02 NOTE — Progress Notes (Signed)
Physical Therapy Treatment Patient Details Name: Brittney Thomas MRN: [id number] DOB: 1933-02-13 Today's Date: 12/02/2014    History of Present Illness Pt is an 81 y/o female with a PMH of adenocarcinoma of lung and mets. Pt was taking oral medication and choked on a pill. When EMS arrived she was noted to be hypoxic and was placed on heparin for presumed PE and a-fib. Pt was admitted for further management.     PT Comments    Pt progressing slowly towards physical therapy goals. Was able to attempt ambulation this session, however was limited with distance due to pt fatigue, dropping O2 sats, and general safety. Discussed with the pt the concern that she would be alone during the days, and pt agreed. At this time I feel that continued therapy at the SNF level would be beneficial to improve safety and independence prior to return home. Pt was agreeable to SNF search and states she would like to go to a facility in [facility]. Will continue to follow and progress as able per POC.  Please see gait training details for vitals.    Follow Up Recommendations  Supervision/Assistance - 24 hour;SNF     Equipment Recommendations  None recommended by PT    Recommendations for Other Services       Precautions / Restrictions Precautions Precautions: Fall Restrictions Weight Bearing Restrictions: No    Mobility  Bed Mobility Overal bed mobility: Needs Assistance Bed Mobility: Supine to Sit     Supine to sit: Min assist     General bed mobility comments: Assist for LE movement to EOB as well as trunk support as pt transitioned fully to sitting position. Bed pad was utilized to assist with scooting as well.   Transfers Overall transfer level: Needs assistance Equipment used: Pushed w/c Transfers: Sit to/from Stand Sit to Stand: Mod assist Stand pivot transfers: Min assist       General transfer comment: Heavy mod assist was required to power-up to full standing position. Increased  time to gain and maintain balance while holding the back of a w/c for support. Min A was required for SPT to [facility].   Ambulation/Gait Ambulation/Gait assistance: Min assist Ambulation Distance (Feet): 35 Feet Assistive device:  (Pushed w/c) Gait Pattern/deviations: Step-through pattern;Decreased stride length;Trunk flexed Gait velocity: Decreased Gait velocity interpretation: Below normal speed for age/gender General Gait Details: Pt was able to ambulate a short distance before requiring a seated rest break. Throughout gait training, HR jumped from low 100's to high 140's and back down to the 100's. O2 sats dropped into low 80's at times on 4L/min supplemental O2. pt was cued for pursed-lip breathing however it did not appear that pt was making correctional changes.    Stairs            Wheelchair Mobility    Modified Rankin (Stroke Patients Only)       Balance Overall balance assessment: Needs assistance Sitting-balance support: Feet supported;No upper extremity supported Sitting balance-Leahy Scale: Fair     Standing balance support: Bilateral upper extremity supported Standing balance-Leahy Scale: Poor                      Cognition Arousal/Alertness: Awake/alert Behavior During Therapy: WFL for tasks assessed/performed Overall Cognitive Status: Within Functional Limits for tasks assessed                      Exercises      General Comments  Pertinent Vitals/Pain Pain Assessment: No/denies pain    Home Living                      Prior Function            PT Goals (current goals can now be found in the care plan section) Acute Rehab PT Goals Patient Stated Goal: Return home PT Goal Formulation: With patient Time For Goal Achievement: 12/08/14 Potential to Achieve Goals: Good Progress towards PT goals: Progressing toward goals    Frequency  Min 3X/week    PT Plan Discharge plan needs to be updated     Co-evaluation             End of Session Equipment Utilized During Treatment: Gait belt Activity Tolerance: Patient limited by fatigue Patient left: in chair;with call bell/phone within reach     Time: 1025-1050 PT Time Calculation (min) (ACUTE ONLY): 25 min  Charges:  $Gait Training: 8-22 mins $Therapeutic Activity: 8-22 mins                    G Codes:      Rolinda Roan 31-Dec-2014, 1:55 PM   Rolinda Roan, PT, DPT Acute Rehabilitation Services Pager: [phone]

## 2014-12-02 NOTE — Care Management Note (Signed)
Case Management Note  Patient Details  Name: Brittney Thomas MRN: [id number] Date of Birth: 09-14-1932  Subjective/Objective:  Pt is transferring out of ICU today. Referred to CSW for SNF placement. Pt reports she is willing to go to [facility] if they have a bed.                 Action/Plan:   Expected Discharge Date:   (pending)               Expected Discharge Plan:  [place] (has lived with daughter since 01/2014. )  In-House Referral:  Clinical Social Work  Discharge planning Services  CM Consult  Post Acute Care Choice:    Choice offered to:     DME Arranged:    DME Agency:   HH Arranged:    [place] Agency:     Status of Service:  In process, will continue to follow  Medicare Important Message Given:    Date Medicare IM Given:    Medicare IM give by:    Date Additional Medicare IM Given:    Additional Medicare Important Message give by:     If discussed at [place] of Stay Meetings, dates discussed:    Additional Comments:  Delrae Sawyers, RN 12/02/2014, 10:53 AM

## 2014-12-02 NOTE — Progress Notes (Signed)
[place] TEAM 1 - Stepdown/ICU TEAM Progress Note  Brittney Thomas JEH:[id number] DOB: 02-Jan-1933 DOA: 11/29/2014 PCP: [name], MD  Admit HPI / Brief Narrative: 81 yo ,WF PMHx stage IV Metastatic adenocarcinoma of lung Dx(04/15/2014), Lung cancer, main bronchus (05/03/2014), IVC filter, rectus sheath hematoma,  Hemorrhagic shock (05/27/2014),Ulcer (06-08-13); Hypertension, Dysrhythmia,Paroxysmal a-fib, Moderate to severe pulmonary hypertension, Thyroid disease; COPDAcute and chronic respiratory failure with hypoxia,, PVD, Hypothyroidism, DVT/ PE (2008);   never smoker, with known adenocarcinoma of lung and mets, dx 10/15 and on iressa daily(Dr. Julien Nordmann) who was taking her oral meds 5/31 and choked on a pill. EMS was activated and she was treated with bronchodilators and steroids. She was noted to be hypoxic and was placed on heparin for presumed PE and Afib. Note in 11/15 she had rectus sheath hematoma and all anticoagulation was discontinued at that time. PCCM called today again for hypoxia, afrvr (on cardizem). She is awake and alert, will admit to ICU, stop heparin for now and most likely dc VQ scan.She remains a full code. We will admit and most likely she can go to sdu in 24 hours. NOTE SHE HAD IVC FILTER PLACED IN 4/16.    HPI/Subjective: 6/3 A/O x 4 no complaints. Per patient not on home O2, although has oxygen set up secondary to previously needing O2 for a short time frame.. Lives w/ Daughter   Assessment/Plan: Chronic PE/DVT, - IVC filter placed in 4/16 - not anticoagulatin candidate secondary to bleeding and faill risk   Acute respiratory failure with hypoxia  -Most likely multifactorial to include aspiration pneumonia, old DVT/PE, and pulmonary hypertension.  -Patient has IVC filter in place. See chronic PE/DVT  Dilated cardiomyopathy -Continue metoprolol 12.5 mg  BID .  Chronic atrial fibrillation -Continue Cardizem drip, currently rate controlled would convert over  to PO Cardizem in the a.m.  Borderline Pulmonary hypertension -See chronic atrial fibrillation/dilated cardiomyopathy  Hypotension -Bolus normal saline 500 ml -Then resume normal saline 61m/hr, monitor for fluid overload -Strict in and out -Daily a.m. weight on standing scale  Acute kidney failure ( Cr baseline 0.9 to 1.1) , with lactic acidosis -Lactic acidosis improving but still elevated. -See hypotension -Monitor closely  Klebsiella  UTI -Ceftriaxone per pharm  Protein calorie malnutrition? -Nutrition consult ordered -Patient passed MBS today dysphagia 3, thin liquids  Blood loss anemia  -No anticoagulation - Currently no bleeding  -Monitor closely  Hypothyroid  -Continue Synthroid 88 g daily -TSH ordered and pending  stage 4 lung cancer -Continue chemo iressa 250 mg daily   Code Status: FULL Family Communication: Daughter present at time of exam Disposition Plan: Discharge in next 24-48 hour's    Consultants:   Procedure/Significant Events: 6/1 echocardiogram;- LVEF=60% to 65%.-  Left atrium: severely dilated. - Right ventricle:  severely dilated.- Right atrium: severely dilated. - Tricuspid valve severe regurgitation.- Pulmonic valve: moderate regurgitation. - Pulmonary arteries: PA peak pressure: 31 mm Hg (S).   Culture   Antibiotics: Ceftriaxone 6/3  DVT prophylaxis: SCD   Devices    LINES / TUBES:      Continuous Infusions: . sodium chloride 75 mL/hr at 12/02/14 1000  . diltiazem (CARDIZEM) infusion 5 mg/hr (12/02/14 1636)    Objective: VITAL SIGNS: Temp: 97.8 F (36.6 C) (06/03 1144) Temp Source: Oral (06/03 1144) BP: 98/50 mmHg (06/03 1533) Pulse Rate: 87 (06/03 1533) SPO2; FIO2:   Intake/Output Summary (Last 24 hours) at 12/02/14 1932 Last data filed at 12/02/14 1923  Gross per 24 hour  Intake   1440 ml  Output    130 ml  Net   1310 ml     Exam: General: A/O  X 4,  No acute respiratory distress Eyes:  Negative headache, eye pain, double vision, retinal hemorrhage ENT: Negative Runny nose, negative ear pain, negative tinnitus, negative gingival bleeding Neck:  Negative scars, masses, torticollis, lymphadenopathy, JVD Lungs: Clear to auscultation bilaterally with Diffuse crackles Cardiovascular: Regular rate and rhythm without murmur gallop or rub normal S1 and S2 Abdomen: negative abdominal pain, negative dysphagia, Nontender, nondistended, soft, bowel sounds positive, no rebound, no ascites, no appreciable mass Extremities: No significant cyanosis, clubbing, or edema bilateral lower extremities Psychiatric:  Negative depression, negative anxiety, negative fatigue, negative mania  Neurologic:  Cranial nerves II through XII intact, tongue/uvula midline, all extremities muscle strength 5/5, sensation intact throughout, finger nose finger bilateral within normal limits, quick finger touch bilateral within normal limits, negative Romberg sign, heel to shin bilateral within normal limits, standing on 1 foot bilateral within normal limits, walking on tiptoes within normal limits, walking on heels within normal limits, negative dysarthria, negative expressive aphasia, negative receptive aphasia.      Data Reviewed: Basic Metabolic Panel:  Recent Labs Lab 11/30/14 0015 11/30/14 0900 12/01/14 0248 12/02/14 0345  NA 141  --  138 135  K 4.3  --  4.3 4.1  CL 95*  --  98* 97*  CO2 30  --  27 28  GLUCOSE 131*  --  114* 118*  BUN 32*  --  34* 44*  CREATININE 1.34*  --  1.18* 1.32*  CALCIUM 9.2  --  8.8* 9.1  MG  --  1.6*  --  2.2  PHOS  --  4.9*  --  4.1   Liver Function Tests:  Recent Labs Lab 12/02/14 0345  AST 25  ALT 16  ALKPHOS 119  BILITOT 0.8  PROT 5.9*  ALBUMIN 2.9*   No results for input(s): LIPASE, AMYLASE in the last 168 hours. No results for input(s): AMMONIA in the last 168 hours. CBC:  Recent Labs Lab 11/30/14 0015 11/30/14 0900 12/01/14 0248 12/02/14 0345  WBC  4.2 4.1 7.9 8.8  HGB 12.9 11.6* 11.7* 11.3*  HCT 40.4 36.0 35.6* 34.5*  MCV 107.2* 106.5* 105.3* 105.2*  PLT 164 125* 127* 136*   Cardiac Enzymes: No results for input(s): CKTOTAL, CKMB, CKMBINDEX, TROPONINI in the last 168 hours. BNP (last 3 results)  Recent Labs  06/26/14 2353 11/30/14 0015  BNP 620.7* 477.8*    ProBNP (last 3 results) No results for input(s): PROBNP in the last 8760 hours.  CBG:  Recent Labs Lab 11/30/14 0950 12/02/14 1149  GLUCAP 142* 102*    Recent Results (from the past 240 hour(s))  Culture, blood (routine x 2) Call MD if unable to obtain prior to antibiotics being given     Status: None (Preliminary result)   Collection Time: 11/30/14  4:20 AM  Result Value Ref Range Status   Specimen Description BLOOD RIGHT FOREARM  Final   Special Requests BOTTLES DRAWN AEROBIC AND ANAEROBIC 5CC EA  Final   Culture   Final           BLOOD CULTURE RECEIVED NO GROWTH TO DATE CULTURE WILL BE HELD FOR 5 DAYS BEFORE ISSUING A FINAL NEGATIVE REPORT Performed at [facility]    Report Status PENDING  Incomplete  Culture, blood (routine x 2) Call MD if unable to obtain prior to antibiotics being given  Status: None (Preliminary result)   Collection Time: 11/30/14  4:20 AM  Result Value Ref Range Status   Specimen Description BLOOD RIGHT FOREARM  Final   Special Requests BOTTLES DRAWN AEROBIC AND ANAEROBIC 5CC EA  Final   Culture   Final           BLOOD CULTURE RECEIVED NO GROWTH TO DATE CULTURE WILL BE HELD FOR 5 DAYS BEFORE ISSUING A FINAL NEGATIVE REPORT Performed at [facility]    Report Status PENDING  Incomplete  MRSA PCR Screening     Status: None   Collection Time: 11/30/14  9:50 AM  Result Value Ref Range Status   MRSA by PCR NEGATIVE NEGATIVE Final    Comment:        The GeneXpert MRSA Assay (FDA approved for NASAL specimens only), is one component of a comprehensive MRSA colonization surveillance program. It is not intended  to diagnose MRSA infection nor to guide or monitor treatment for MRSA infections.   Urine culture     Status: None   Collection Time: 11/30/14 12:00 PM  Result Value Ref Range Status   Specimen Description URINE, RANDOM  Final   Special Requests NONE  Final   Colony Count   Final    >=100,000 COLONIES/ML Performed at [facility]    Culture   Final    KLEBSIELLA PNEUMONIAE Performed at [facility]    Report Status 12/02/2014 FINAL  Final   Organism ID, Bacteria KLEBSIELLA PNEUMONIAE  Final      Susceptibility   Klebsiella pneumoniae - MIC*    AMPICILLIN >=32 RESISTANT Resistant     CEFAZOLIN <=4 SENSITIVE Sensitive     CEFTRIAXONE <=1 SENSITIVE Sensitive     CIPROFLOXACIN <=0.25 SENSITIVE Sensitive     GENTAMICIN <=1 SENSITIVE Sensitive     LEVOFLOXACIN <=0.12 SENSITIVE Sensitive     NITROFURANTOIN 32 SENSITIVE Sensitive     TOBRAMYCIN <=1 SENSITIVE Sensitive     TRIMETH/SULFA <=20 SENSITIVE Sensitive     PIP/TAZO 8 SENSITIVE Sensitive     * KLEBSIELLA PNEUMONIAE     Studies:  Recent x-ray studies have been reviewed in detail by the Attending Physician  Scheduled Meds:  Scheduled Meds: . antiseptic oral rinse  7 mL Mouth Rinse BID  . aspirin EC  81 mg Oral Daily  . calcium-vitamin D  1 tablet Oral Q breakfast  . cefTRIAXone (ROCEPHIN)  IV  1 g Intravenous Q24H  . enoxaparin (LOVENOX) injection  40 mg Subcutaneous Q24H  . folic acid  1 mg Oral Daily  . gefitinib  250 mg Oral Daily  . ipratropium-albuterol  3 mL Nebulization Q6H  . lactose free nutrition  237 mL Oral Daily  . levothyroxine  88 mcg Oral QAC breakfast  . metoprolol tartrate  12.5 mg Oral BID  . omega-3 acid ethyl esters  1,000 mg Oral Daily  . pantoprazole  40 mg Oral QHS  . sodium chloride  500 mL Intravenous Once    Time spent on care of this patient: 40 mins   Alyssah Algeo, Geraldo Docker , MD  [facility] Office  [phone] Pager - [phone]  On-Call/Text Page:       [email or web address]      password [email or web address]  If 7PM-7AM, please contact night-coverage [email or web address] Password [email or web address] 12/02/2014, 7:32 PM   LOS: 2 days   Care during the described time interval was provided by me .  I have reviewed this patient's available data, including medical history, events  of note, physical examination, and all test results as part of my evaluation. I have personally reviewed and interpreted all radiology studies.   Dia Crawford, MD [phone] Pager

## 2014-12-02 NOTE — Progress Notes (Signed)
MBSS complete. Full report located under chart review in imaging section.  Elgie Landino Paiewonsky, M.A. CCC-SLP [phone]  

## 2014-12-03 DIAGNOSIS — N390 Urinary tract infection, site not specified: Secondary | ICD-10-CM | POA: Diagnosis present

## 2014-12-03 DIAGNOSIS — I2699 Other pulmonary embolism without acute cor pulmonale: Secondary | ICD-10-CM | POA: Diagnosis present

## 2014-12-03 LAB — CBC WITH DIFFERENTIAL/PLATELET
BASOS PCT: 0 % (ref 0–1)
Basophils Absolute: 0 10*3/uL (ref 0.0–0.1)
Eosinophils Absolute: 0 10*3/uL (ref 0.0–0.7)
Eosinophils Relative: 0 % (ref 0–5)
HCT: 34.4 % — ABNORMAL LOW (ref 36.0–46.0)
HEMOGLOBIN: 11.3 g/dL — AB (ref 12.0–15.0)
LYMPHS ABS: 0.2 10*3/uL — AB (ref 0.7–4.0)
Lymphocytes Relative: 3 % — ABNORMAL LOW (ref 12–46)
MCH: 33.9 pg (ref 26.0–34.0)
MCHC: 32.8 g/dL (ref 30.0–36.0)
MCV: 103.3 fL — AB (ref 78.0–100.0)
MONOS PCT: 10 % (ref 3–12)
Monocytes Absolute: 0.6 10*3/uL (ref 0.1–1.0)
NEUTROS ABS: 5.2 10*3/uL (ref 1.7–7.7)
NEUTROS PCT: 87 % — AB (ref 43–77)
Platelets: 125 10*3/uL — ABNORMAL LOW (ref 150–400)
RBC: 3.33 MIL/uL — ABNORMAL LOW (ref 3.87–5.11)
RDW: 20.6 % — ABNORMAL HIGH (ref 11.5–15.5)
WBC: 6 10*3/uL (ref 4.0–10.5)

## 2014-12-03 LAB — COMPREHENSIVE METABOLIC PANEL
ALK PHOS: 122 U/L (ref 38–126)
ALT: 17 U/L (ref 14–54)
ANION GAP: 10 (ref 5–15)
AST: 24 U/L (ref 15–41)
Albumin: 2.8 g/dL — ABNORMAL LOW (ref 3.5–5.0)
BILIRUBIN TOTAL: 0.9 mg/dL (ref 0.3–1.2)
BUN: 57 mg/dL — ABNORMAL HIGH (ref 6–20)
CO2: 27 mmol/L (ref 22–32)
CREATININE: 1.51 mg/dL — AB (ref 0.44–1.00)
Calcium: 9 mg/dL (ref 8.9–10.3)
Chloride: 99 mmol/L — ABNORMAL LOW (ref 101–111)
GFR calc Af Amer: 36 mL/min — ABNORMAL LOW (ref >60)
GFR, EST NON AFRICAN AMERICAN: 31 mL/min — AB (ref >60)
Glucose, Bld: 119 mg/dL — ABNORMAL HIGH (ref 65–99)
Potassium: 4.6 mmol/L (ref 3.5–5.1)
SODIUM: 136 mmol/L (ref 135–145)
TOTAL PROTEIN: 5.5 g/dL — AB (ref 6.5–8.1)

## 2014-12-03 LAB — MAGNESIUM: MAGNESIUM: 2 mg/dL (ref 1.7–2.4)

## 2014-12-03 LAB — TSH: TSH: 1.737 u[IU]/mL (ref 0.350–4.500)

## 2014-12-03 LAB — LACTIC ACID, PLASMA: Lactic Acid, Venous: 1.6 mmol/L (ref 0.5–2.0)

## 2014-12-03 MED ORDER — ENSURE ENLIVE PO LIQD
237.0000 mL | Freq: Three times a day (TID) | ORAL | Status: DC
  Administered 2014-12-03 – 2014-12-09 (×14): 237 mL via ORAL

## 2014-12-03 MED ORDER — LEVALBUTEROL HCL 0.63 MG/3ML IN NEBU
0.6300 mg | INHALATION_SOLUTION | Freq: Two times a day (BID) | RESPIRATORY_TRACT | Status: DC
  Administered 2014-12-03 – 2014-12-04 (×3): 0.63 mg via RESPIRATORY_TRACT
  Filled 2014-12-03 (×8): qty 3

## 2014-12-03 MED ORDER — IPRATROPIUM BROMIDE HFA 17 MCG/ACT IN AERS: 2.0000 | INHALATION_SPRAY | RESPIRATORY_TRACT | Status: DC

## 2014-12-03 MED ORDER — IPRATROPIUM BROMIDE HFA 17 MCG/ACT IN AERS: 2.0000 | INHALATION_SPRAY | Freq: Two times a day (BID) | RESPIRATORY_TRACT | Status: DC

## 2014-12-03 MED ORDER — FUROSEMIDE 20 MG PO TABS
20.0000 mg | ORAL_TABLET | Freq: Two times a day (BID) | ORAL | Status: DC
  Administered 2014-12-04 (×2): 20 mg via ORAL
  Filled 2014-12-03 (×5): qty 1

## 2014-12-03 MED ORDER — IPRATROPIUM BROMIDE 0.02 % IN SOLN
0.5000 mg | Freq: Two times a day (BID) | RESPIRATORY_TRACT | Status: DC
  Administered 2014-12-03 – 2014-12-04 (×3): 0.5 mg via RESPIRATORY_TRACT
  Filled 2014-12-03 (×4): qty 2.5

## 2014-12-03 MED ORDER — CEFUROXIME AXETIL 500 MG PO TABS
500.0000 mg | ORAL_TABLET | Freq: Two times a day (BID) | ORAL | Status: DC
  Administered 2014-12-03: 500 mg via ORAL
  Filled 2014-12-03 (×2): qty 1

## 2014-12-03 MED ORDER — DILTIAZEM HCL ER 60 MG PO CP12
60.0000 mg | ORAL_CAPSULE | Freq: Two times a day (BID) | ORAL | Status: DC
  Administered 2014-12-03 – 2014-12-09 (×8): 60 mg via ORAL
  Filled 2014-12-03 (×14): qty 1

## 2014-12-03 MED ORDER — FUROSEMIDE 10 MG/ML IJ SOLN
40.0000 mg | Freq: Once | INTRAMUSCULAR | Status: AC
  Administered 2014-12-03: 40 mg via INTRAVENOUS
  Filled 2014-12-03: qty 4

## 2014-12-03 NOTE — Progress Notes (Signed)
Nutrition Follow-up / Consult  DOCUMENTATION CODES:  Non-severe (moderate) malnutrition in context of chronic illness  INTERVENTION:   Continue Boost Plus PO daily, each supplement provides 360 kcal and 14 gm protein  Continue bedtime snack daily   NUTRITION DIAGNOSIS:  Malnutrition related to chronic illness as evidenced by severe depletion of muscle mass, moderate to severe fluid accumulation, ongoing.  GOAL:  Patient will meet greater than or equal to 90% of their needs, progressing.  MONITOR:  PO intake, Supplement acceptance, Weight trends  REASON FOR ASSESSMENT:  Consult Assessment of nutrition requirement/status  ASSESSMENT: Patient dx with adenocarcinoma of lung and mets in October 2015. She was taking her oral meds 5/31 and choked on a pill. Brought to the hospital by EMS.   Patients reports that she has been eating okay, but she's just not hungry. She consumed most of her breakfast today. PO intake of meals varies between 25 and 100%. She is drinking the Boost Plus supplement once daily and receiving a snack at bedtime daily.  Height:  Ht Readings from Last 1 Encounters:  12/02/14 '5\' 6"'$  (1.676 m)    Weight:  Wt Readings from Last 1 Encounters:  12/03/14 187 lb 12.8 oz (85.186 kg)    Ideal Body Weight:  56.8 kg  Wt Readings from Last 10 Encounters:  12/03/14 187 lb 12.8 oz (85.186 kg)  11/07/14 146 lb 1.6 oz (66.271 kg)  09/06/14 189 lb 8 oz (85.957 kg)  08/04/14 180 lb 4.8 oz (81.784 kg)  07/07/14 185 lb 6.5 oz (84.1 kg)  06/16/14 189 lb 12.8 oz (86.093 kg)  06/06/14 182 lb 5.1 oz (82.7 kg)  05/16/14 152 lb 1.6 oz (68.992 kg)  05/03/14 154 lb 11.2 oz (70.171 kg)  04/29/14 161 lb 12.8 oz (73.392 kg)    BMI:  Body mass index is 30.33 kg/(m^2).  Estimated Nutritional Needs:  Kcal:  1600-1800  Protein:  90-105 gm  Fluid:  1.8 L  Skin:  Wound (see comment) (stage 2 pressure ulcers to sacrum and buttocks)  Diet Order:  DIET DYS 3 Room  service appropriate?: Yes; Fluid consistency:: Thin   Intake/Output Summary (Last 24 hours) at 12/03/14 0949 Last data filed at 12/03/14 0848  Gross per 24 hour  Intake 2407.83 ml  Output    275 ml  Net 2132.83 ml    Last BM:  6/3   Molli Barrows, RD, LDN, [place] Pager [phone] After Hours Pager [phone]

## 2014-12-03 NOTE — Progress Notes (Signed)
[place] TEAM 1 - Stepdown/ICU TEAM Progress Note  Brittney Thomas ZDG:[id number] DOB: Feb 05, 1933 DOA: 11/29/2014 PCP: [name], MD  Admit HPI / Brief Narrative: 81 yo ,WF PMHx stage IV Metastatic adenocarcinoma of lung Dx(04/15/2014), Lung cancer, main bronchus (05/03/2014), IVC filter, rectus sheath hematoma,  Hemorrhagic shock (05/27/2014),Ulcer (06-08-13); Hypertension, Dysrhythmia,Paroxysmal a-fib, Moderate to severe pulmonary hypertension, Thyroid disease; COPDAcute and chronic respiratory failure with hypoxia,, PVD, Hypothyroidism, DVT/ PE (2008);   never smoker, with known adenocarcinoma of lung and mets, dx 10/15 and on iressa daily(Dr. Julien Nordmann) who was taking her oral meds 5/31 and choked on a pill. EMS was activated and she was treated with bronchodilators and steroids. She was noted to be hypoxic and was placed on heparin for presumed PE and Afib. Note in 11/15 she had rectus sheath hematoma and all anticoagulation was discontinued at that time. PCCM called today again for hypoxia, afrvr (on cardizem). She is awake and alert, will admit to ICU, stop heparin for now and most likely dc VQ scan.She remains a full code. We will admit and most likely she can go to sdu in 24 hours. NOTE SHE HAD IVC FILTER PLACED IN 4/16.    HPI/Subjective: 6/4 A/O x 4 , patient states feels a little bit SOB today however was able to get to chair, and sit on the side of the bed. States she has tried nebulizer treatments for her breathing before and does want to try again. States her dry weight at home is ~162 pounds (73.4 kg)    Assessment/Plan: Chronic PE/DVT, - IVC filter placed in 4/16 - not anticoagulatin candidate secondary to bleeding and faill risk   Acute respiratory failure with hypoxia  -Most likely multifactorial to include aspiration pneumonia, old DVT/PE, and pulmonary hypertension.  -Patient has IVC filter in place. See chronic PE/DVT  Dilated cardiomyopathy -Continue metoprolol  12.5 mg  BID . -Strict in and out; since admission; + 6.8 L -6/4 standing weight= 85 kg(187 pounds) -Lasix 40 mg 1, then restart home dose of 20 mg BID  Chronic atrial fibrillation -DC Cardizem drip -Cardizem 60 mg PO BID  Borderline Pulmonary hypertension -See chronic atrial fibrillation/dilated cardiomyopathy  Hypotension -KVO fluids   Acute kidney failure ( Cr baseline 0.9 to 1.1) , with lactic acidosis -Lactic acidosis resolved -Cr trending up most likely secondary to fluid overload   Klebsiella  UTI -Ceftriaxone per pharm; switch to Cefuroxime  Moderate protein calorie malnutrition -Nutrition consult ordered -Patient passed MBS today dysphagia 3, thin liquids -Encourage patient to finish all 3 meals -Start ensure between meals  Blood loss anemia  -No anticoagulation - Currently no bleeding  -Monitor closely  Hypothyroid  -Continue Synthroid 88 g daily -TSH; within normal limit  stage 4 lung cancer -Continue chemo iressa 250 mg daily      Code Status: FULL Family Communication: None Disposition Plan: Discharge in next 24-48 hour's    Consultants:   Procedure/Significant Events: 6/1 echocardiogram;- LVEF=60% to 65%.-  Left atrium: severely dilated. - Right ventricle:  severely dilated.- Right atrium: severely dilated. - Tricuspid valve severe regurgitation.- Pulmonic valve: moderate regurgitation. - Pulmonary arteries: PA peak pressure: 31 mm Hg (S).   Culture   Antibiotics: Ceftriaxone 6/3>> stopped 6/4 Cefuroxime 6/4>>  DVT prophylaxis: SCD   Devices    LINES / TUBES:      Continuous Infusions: . sodium chloride 75 mL/hr at 12/02/14 2223    Objective: VITAL SIGNS: Temp: 97.4 F (36.3 C) (06/04 1400) Temp Source:  Oral (06/04 1400) BP: 99/68 mmHg (06/04 1400) Pulse Rate: 103 (06/04 1400) SPO2; FIO2:   Intake/Output Summary (Last 24 hours) at 12/03/14 1622 Last data filed at 12/03/14 1400  Gross per 24 hour  Intake  2567.83 ml  Output    626 ml  Net 1941.83 ml     Exam: General: A/O  X 4,  No acute respiratory distress Eyes: Negative headache, eye pain, double vision, retinal hemorrhage ENT: Negative Runny nose, negative ear pain, negative tinnitus, negative gingival bleeding Neck:  Negative scars, masses, torticollis, lymphadenopathy, JVD Lungs: diffuse expiratory wheezing, negative crackles Cardiovascular: Irregular irregular rhythm and rate, without murmur gallop or rub normal S1 and S2 Abdomen: negative abdominal pain, negative dysphagia, Nontender, nondistended, soft, bowel sounds positive, no rebound, no ascites, no appreciable mass Extremities: No significant cyanosis, clubbing. Bilateral pedal edema 2+ to knees Lt> Rt,  Psychiatric:  Negative depression, negative anxiety, negative fatigue, negative mania  Neurologic:  Cranial nerves II through XII intact, tongue/uvula midline, all extremities muscle strength 5/5, sensation intact throughout, negative dysarthria, negative expressive aphasia, negative receptive aphasia.      Data Reviewed: Basic Metabolic Panel:  Recent Labs Lab 11/30/14 0015 11/30/14 0900 12/01/14 0248 12/02/14 0345 12/03/14 0358  NA 141  --  138 135 136  K 4.3  --  4.3 4.1 4.6  CL 95*  --  98* 97* 99*  CO2 30  --  '27 28 27  '$ GLUCOSE 131*  --  114* 118* 119*  BUN 32*  --  34* 44* 57*  CREATININE 1.34*  --  1.18* 1.32* 1.51*  CALCIUM 9.2  --  8.8* 9.1 9.0  MG  --  1.6*  --  2.2 2.0  PHOS  --  4.9*  --  4.1  --    Liver Function Tests:  Recent Labs Lab 12/02/14 0345 12/03/14 0358  AST 25 24  ALT 16 17  ALKPHOS 119 122  BILITOT 0.8 0.9  PROT 5.9* 5.5*  ALBUMIN 2.9* 2.8*   No results for input(s): LIPASE, AMYLASE in the last 168 hours. No results for input(s): AMMONIA in the last 168 hours. CBC:  Recent Labs Lab 11/30/14 0015 11/30/14 0900 12/01/14 0248 12/02/14 0345 12/03/14 0358  WBC 4.2 4.1 7.9 8.8 6.0  NEUTROABS  --   --   --   --  5.2    HGB 12.9 11.6* 11.7* 11.3* 11.3*  HCT 40.4 36.0 35.6* 34.5* 34.4*  MCV 107.2* 106.5* 105.3* 105.2* 103.3*  PLT 164 125* 127* 136* 125*   Cardiac Enzymes: No results for input(s): CKTOTAL, CKMB, CKMBINDEX, TROPONINI in the last 168 hours. BNP (last 3 results)  Recent Labs  06/26/14 2353 11/30/14 0015  BNP 620.7* 477.8*    ProBNP (last 3 results) No results for input(s): PROBNP in the last 8760 hours.  CBG:  Recent Labs Lab 11/30/14 0950 12/02/14 1149  GLUCAP 142* 102*    Recent Results (from the past 240 hour(s))  Culture, blood (routine x 2) Call MD if unable to obtain prior to antibiotics being given     Status: None (Preliminary result)   Collection Time: 11/30/14  4:20 AM  Result Value Ref Range Status   Specimen Description BLOOD RIGHT FOREARM  Final   Special Requests BOTTLES DRAWN AEROBIC AND ANAEROBIC 5CC EA  Final   Culture   Final           BLOOD CULTURE RECEIVED NO GROWTH TO DATE CULTURE WILL BE HELD FOR 5 DAYS BEFORE  ISSUING A FINAL NEGATIVE REPORT Performed at [facility]    Report Status PENDING  Incomplete  Culture, blood (routine x 2) Call MD if unable to obtain prior to antibiotics being given     Status: None (Preliminary result)   Collection Time: 11/30/14  4:20 AM  Result Value Ref Range Status   Specimen Description BLOOD RIGHT FOREARM  Final   Special Requests BOTTLES DRAWN AEROBIC AND ANAEROBIC 5CC EA  Final   Culture   Final           BLOOD CULTURE RECEIVED NO GROWTH TO DATE CULTURE WILL BE HELD FOR 5 DAYS BEFORE ISSUING A FINAL NEGATIVE REPORT Performed at [facility]    Report Status PENDING  Incomplete  MRSA PCR Screening     Status: None   Collection Time: 11/30/14  9:50 AM  Result Value Ref Range Status   MRSA by PCR NEGATIVE NEGATIVE Final    Comment:        The GeneXpert MRSA Assay (FDA approved for NASAL specimens only), is one component of a comprehensive MRSA colonization surveillance program. It is  not intended to diagnose MRSA infection nor to guide or monitor treatment for MRSA infections.   Urine culture     Status: None   Collection Time: 11/30/14 12:00 PM  Result Value Ref Range Status   Specimen Description URINE, RANDOM  Final   Special Requests NONE  Final   Colony Count   Final    >=100,000 COLONIES/ML Performed at [facility]    Culture   Final    KLEBSIELLA PNEUMONIAE Performed at [facility]    Report Status 12/02/2014 FINAL  Final   Organism ID, Bacteria KLEBSIELLA PNEUMONIAE  Final      Susceptibility   Klebsiella pneumoniae - MIC*    AMPICILLIN >=32 RESISTANT Resistant     CEFAZOLIN <=4 SENSITIVE Sensitive     CEFTRIAXONE <=1 SENSITIVE Sensitive     CIPROFLOXACIN <=0.25 SENSITIVE Sensitive     GENTAMICIN <=1 SENSITIVE Sensitive     LEVOFLOXACIN <=0.12 SENSITIVE Sensitive     NITROFURANTOIN 32 SENSITIVE Sensitive     TOBRAMYCIN <=1 SENSITIVE Sensitive     TRIMETH/SULFA <=20 SENSITIVE Sensitive     PIP/TAZO 8 SENSITIVE Sensitive     * KLEBSIELLA PNEUMONIAE     Studies:  Recent x-ray studies have been reviewed in detail by the Attending Physician  Scheduled Meds:  Scheduled Meds: . antiseptic oral rinse  7 mL Mouth Rinse BID  . aspirin EC  81 mg Oral Daily  . calcium-vitamin D  1 tablet Oral Q breakfast  . cefUROXime  500 mg Oral BID WC  . diltiazem  60 mg Oral Q12H  . enoxaparin (LOVENOX) injection  40 mg Subcutaneous Q24H  . feeding supplement (ENSURE ENLIVE)  237 mL Oral TID BM  . folic acid  1 mg Oral Daily  . furosemide  40 mg Intravenous Once  . [START ON 12/04/2014] furosemide  20 mg Oral BID  . gefitinib  250 mg Oral Daily  . ipratropium  0.5 mg Nebulization BID  . lactose free nutrition  237 mL Oral Daily  . levalbuterol  0.63 mg Nebulization BID  . levothyroxine  88 mcg Oral QAC breakfast  . metoprolol tartrate  12.5 mg Oral BID  . omega-3 acid ethyl esters  1,000 mg Oral Daily  . pantoprazole  40 mg Oral QHS     Time spent on care of this patient: 32  mins   WOODS, Geraldo Docker , MD  [facility] Office  [phone] Pager [phone]  On-Call/Text Page:      [email or web address]      password [email or web address]  If 7PM-7AM, please contact night-coverage [email or web address] Password [email or web address] 12/03/2014, 4:22 PM   LOS: 3 days   Care during the described time interval was provided by me .  I have reviewed this patient's available data, including medical history, events of note, physical examination, and all test results as part of my evaluation. I have personally reviewed and interpreted all radiology studies.   Dia Crawford, MD [phone] Pager

## 2014-12-03 NOTE — Progress Notes (Signed)
Pt BP 94/62 manual, HR in 80-90s afib. Lopressor po 12.'5mg'$  due tonight, Spoke with NP on call, ordered to hold lopressor tonight.

## 2014-12-04 ENCOUNTER — Inpatient Hospital Stay (HOSPITAL_COMMUNITY): Payer: Medicare Other

## 2014-12-04 DIAGNOSIS — R319 Hematuria, unspecified: Secondary | ICD-10-CM

## 2014-12-04 DIAGNOSIS — E440 Moderate protein-calorie malnutrition: Secondary | ICD-10-CM

## 2014-12-04 DIAGNOSIS — I82409 Acute embolism and thrombosis of unspecified deep veins of unspecified lower extremity: Secondary | ICD-10-CM

## 2014-12-04 DIAGNOSIS — I2699 Other pulmonary embolism without acute cor pulmonale: Secondary | ICD-10-CM | POA: Diagnosis present

## 2014-12-04 LAB — COMPREHENSIVE METABOLIC PANEL
ALT: 18 U/L (ref 14–54)
ANION GAP: 9 (ref 5–15)
AST: 25 U/L (ref 15–41)
Albumin: 2.9 g/dL — ABNORMAL LOW (ref 3.5–5.0)
Alkaline Phosphatase: 118 U/L (ref 38–126)
BUN: 65 mg/dL — ABNORMAL HIGH (ref 6–20)
CHLORIDE: 98 mmol/L — AB (ref 101–111)
CO2: 29 mmol/L (ref 22–32)
Calcium: 8.9 mg/dL (ref 8.9–10.3)
Creatinine, Ser: 1.77 mg/dL — ABNORMAL HIGH (ref 0.44–1.00)
GFR calc non Af Amer: 26 mL/min — ABNORMAL LOW (ref >60)
GFR, EST AFRICAN AMERICAN: 30 mL/min — AB (ref >60)
Glucose, Bld: 102 mg/dL — ABNORMAL HIGH (ref 65–99)
Potassium: 5 mmol/L (ref 3.5–5.1)
Sodium: 136 mmol/L (ref 135–145)
TOTAL PROTEIN: 5.6 g/dL — AB (ref 6.5–8.1)
Total Bilirubin: 1.1 mg/dL (ref 0.3–1.2)

## 2014-12-04 LAB — CBC WITH DIFFERENTIAL/PLATELET
BASOS PCT: 0 % (ref 0–1)
Basophils Absolute: 0 10*3/uL (ref 0.0–0.1)
EOS ABS: 0 10*3/uL (ref 0.0–0.7)
Eosinophils Relative: 0 % (ref 0–5)
HCT: 35 % — ABNORMAL LOW (ref 36.0–46.0)
Hemoglobin: 11.5 g/dL — ABNORMAL LOW (ref 12.0–15.0)
LYMPHS ABS: 0.4 10*3/uL — AB (ref 0.7–4.0)
Lymphocytes Relative: 10 % — ABNORMAL LOW (ref 12–46)
MCH: 33.9 pg (ref 26.0–34.0)
MCHC: 32.9 g/dL (ref 30.0–36.0)
MCV: 103.2 fL — ABNORMAL HIGH (ref 78.0–100.0)
MONOS PCT: 17 % — AB (ref 3–12)
Monocytes Absolute: 0.7 10*3/uL (ref 0.1–1.0)
NEUTROS ABS: 3 10*3/uL (ref 1.7–7.7)
NEUTROS PCT: 73 % (ref 43–77)
Platelets: 121 10*3/uL — ABNORMAL LOW (ref 150–400)
RBC: 3.39 MIL/uL — ABNORMAL LOW (ref 3.87–5.11)
RDW: 20.1 % — ABNORMAL HIGH (ref 11.5–15.5)
WBC: 4.1 10*3/uL (ref 4.0–10.5)

## 2014-12-04 LAB — EXPECTORATED SPUTUM ASSESSMENT W REFEX TO RESP CULTURE

## 2014-12-04 LAB — EXPECTORATED SPUTUM ASSESSMENT W GRAM STAIN, RFLX TO RESP C

## 2014-12-04 LAB — MAGNESIUM: Magnesium: 2 mg/dL (ref 1.7–2.4)

## 2014-12-04 MED ORDER — ENOXAPARIN SODIUM 30 MG/0.3ML ~~LOC~~ SOLN
30.0000 mg | SUBCUTANEOUS | Status: DC
  Administered 2014-12-04 – 2014-12-08 (×5): 30 mg via SUBCUTANEOUS
  Filled 2014-12-04 (×6): qty 0.3

## 2014-12-04 MED ORDER — CEFUROXIME AXETIL 500 MG PO TABS
500.0000 mg | ORAL_TABLET | Freq: Two times a day (BID) | ORAL | Status: DC
  Administered 2014-12-04 – 2014-12-07 (×7): 500 mg via ORAL
  Filled 2014-12-04 (×9): qty 1

## 2014-12-04 NOTE — Progress Notes (Signed)
Brittney Thomas  Brittney Thomas WSF:[id number] DOB: 10-26-1932 DOA: 11/29/2014 PCP: No primary care provider on file.  Admit HPI / Brief Narrative: 81 yo ,WF PMHx stage IV Metastatic adenocarcinoma of lung Dx(04/15/2014), Lung cancer, main bronchus (05/03/2014), IVC filter, rectus sheath hematoma,  Hemorrhagic shock (05/27/2014),Ulcer (06-08-13); Hypertension, Dysrhythmia,Paroxysmal a-fib, Moderate to severe pulmonary hypertension, Thyroid disease; COPDAcute and chronic respiratory failure with hypoxia,, PVD, Hypothyroidism, DVT/ PE (2008);   never smoker, with known adenocarcinoma of lung and mets, dx 10/15 and on iressa daily(Dr. Julien Nordmann) who was taking her oral meds 5/31 and choked on a pill. EMS was activated and she was treated with bronchodilators and steroids. She was noted to be hypoxic and was placed on heparin for presumed PE and Afib. Thomas in 11/15 she had rectus sheath hematoma and all anticoagulation was discontinued at that time. PCCM called today again for hypoxia, afrvr (on cardizem). She is awake and alert, will admit to ICU, stop heparin for now and most likely dc VQ scan.She remains a full code. We will admit and most likely she can go to sdu in 24 hours. Thomas SHE HAD IVC FILTER PLACED IN 4/16.    HPI/Subjective: 6/5 A/O x 4 , patient states feels ready for discharge. States is mobilizing sputum now (yellow thick). States she has tried nebulizer treatments for her breathing before and does want to try again. States her dry weight at home is ~162 pounds (73.4 kg)    Assessment/Plan: Chronic PE/DVT, - IVC filter placed in 4/16 - not anticoagulatin candidate secondary to bleeding and faill risk   Acute respiratory failure with hypoxia  -Most likely multifactorial to include aspiration pneumonia, old DVT/PE, and pulmonary hypertension.  -Patient has IVC filter in place. See chronic PE/DVT -Sputum culture pending  Dilated  cardiomyopathy -Continue metoprolol 12.5 mg  BID . -Strict in and out; since admission; + 5.7 L -6/5 standing weight= 84.8 kg -Continue Lasix home dose of 20 mg BID  Chronic atrial fibrillation -Rate fairly well controlled, would not increase any of her medication currently  -Continue Cardizem 60 mg PO BID  Borderline Pulmonary hypertension -See chronic atrial fibrillation/dilated cardiomyopathy  Hypotension -KVO fluids   Acute kidney failure ( Cr baseline 0.9 to 1.1) , with lactic acidosis -Lactic acidosis resolved -Cr trending up most likely secondary to fluid overload   Klebsiella  UTI -Continue Cefuroxime and complete 5 day course of antibiotics  Moderate protein calorie malnutrition -Nutrition consult ordered -Patient passed MBS today dysphagia 3, thin liquids -Encourage patient to finish all 3 meals -Continue ensure between meals  Blood loss anemia  -No anticoagulation - Currently no bleeding  -Monitor closely  Hypothyroid  -Continue Synthroid 88 g daily -TSH; within normal limit  stage 4 lung cancer -Continue chemo iressa 250 mg daily  Deconditioning -Although patient would benefit from SNF, insurance denied request for SNF -Discharge in a.m. with home health, RN, PT,      Code Status: FULL Family Communication: None Disposition Plan: Discharge in next 24-48 hour's    Consultants:   Procedure/Significant Events: 6/1 echocardiogram;- LVEF=60% to 65%.-  Left atrium: severely dilated. - Right ventricle:  severely dilated.- Right atrium: severely dilated. - Tricuspid valve severe regurgitation.- Pulmonic valve: moderate regurgitation. - Pulmonary arteries: PA peak pressure: 31 mm Hg (S).   Culture   Antibiotics: Ceftriaxone 6/3>> stopped 6/4 Cefuroxime 6/4>>  DVT prophylaxis: SCD   Devices    LINES / TUBES:  Continuous Infusions: . sodium chloride Stopped (12/03/14 1600)    Objective: VITAL SIGNS: Temp: 97.5 F (36.4  C) (06/05 2028) Temp Source: Oral (06/05 2028) BP: 123/76 mmHg (06/05 2028) Pulse Rate: 125 (06/05 2028) SPO2; FIO2:   Intake/Output Summary (Last 24 hours) at 12/04/14 2114 Last data filed at 12/04/14 2031  Gross per 24 hour  Intake   1140 ml  Output   2525 ml  Net  -1385 ml     Exam: General: A/O  X 4,  No acute respiratory distress Eyes: Negative headache, eye pain, double vision, retinal hemorrhage ENT: Negative Runny nose, negative ear pain, negative tinnitus, negative gingival bleeding Neck:  Negative scars, masses, torticollis, lymphadenopathy, JVD Lungs: diffuse expiratory wheezing, negative crackles Cardiovascular: Irregular irregular rhythm and rate, without murmur gallop or rub normal S1 and S2 Abdomen: negative abdominal pain, negative dysphagia, Nontender, nondistended, soft, bowel sounds positive, no rebound, no ascites, no appreciable mass Extremities: No significant cyanosis, clubbing. Bilateral pedal edema 2+ to knees Lt> Rt,  Psychiatric:  Negative depression, negative anxiety, negative fatigue, negative mania  Neurologic:  Cranial nerves II through XII intact, tongue/uvula midline, all extremities muscle strength 5/5, sensation intact throughout, negative dysarthria, negative expressive aphasia, negative receptive aphasia.      Data Reviewed: Basic Metabolic Panel:  Recent Labs Lab 11/30/14 0015 11/30/14 0900 12/01/14 0248 12/02/14 0345 12/03/14 0358 12/04/14 0451  NA 141  --  138 135 136 136  K 4.3  --  4.3 4.1 4.6 5.0  CL 95*  --  98* 97* 99* 98*  CO2 30  --  '27 28 27 29  '$ GLUCOSE 131*  --  114* 118* 119* 102*  BUN 32*  --  34* 44* 57* 65*  CREATININE 1.34*  --  1.18* 1.32* 1.51* 1.77*  CALCIUM 9.2  --  8.8* 9.1 9.0 8.9  MG  --  1.6*  --  2.2 2.0 2.0  PHOS  --  4.9*  --  4.1  --   --    Liver Function Tests:  Recent Labs Lab 12/02/14 0345 12/03/14 0358 12/04/14 0451  AST '25 24 25  '$ ALT '16 17 18  '$ ALKPHOS 119 122 118  BILITOT 0.8 0.9  1.1  PROT 5.9* 5.5* 5.6*  ALBUMIN 2.9* 2.8* 2.9*   No results for input(s): LIPASE, AMYLASE in the last 168 hours. No results for input(s): AMMONIA in the last 168 hours. CBC:  Recent Labs Lab 11/30/14 0900 12/01/14 0248 12/02/14 0345 12/03/14 0358 12/04/14 0451  WBC 4.1 7.9 8.8 6.0 4.1  NEUTROABS  --   --   --  5.2 3.0  HGB 11.6* 11.7* 11.3* 11.3* 11.5*  HCT 36.0 35.6* 34.5* 34.4* 35.0*  MCV 106.5* 105.3* 105.2* 103.3* 103.2*  PLT 125* 127* 136* 125* 121*   Cardiac Enzymes: No results for input(s): CKTOTAL, CKMB, CKMBINDEX, TROPONINI in the last 168 hours. BNP (last 3 results)  Recent Labs  06/26/14 2353 11/30/14 0015  BNP 620.7* 477.8*    ProBNP (last 3 results) No results for input(s): PROBNP in the last 8760 hours.  CBG:  Recent Labs Lab 11/30/14 0950 12/02/14 1149  GLUCAP 142* 102*    Recent Results (from the past 240 hour(s))  Culture, blood (routine x 2) Call MD if unable to obtain prior to antibiotics being given     Status: None (Preliminary result)   Collection Time: 11/30/14  4:20 AM  Result Value Ref Range Status   Specimen Description BLOOD RIGHT FOREARM  Final  Special Requests BOTTLES DRAWN AEROBIC AND ANAEROBIC 5CC EA  Final   Culture   Final           BLOOD CULTURE RECEIVED NO GROWTH TO DATE CULTURE WILL BE HELD FOR 5 DAYS BEFORE ISSUING A FINAL NEGATIVE REPORT Performed at [facility]    Report Status PENDING  Incomplete  Culture, blood (routine x 2) Call MD if unable to obtain prior to antibiotics being given     Status: None (Preliminary result)   Collection Time: 11/30/14  4:20 AM  Result Value Ref Range Status   Specimen Description BLOOD RIGHT FOREARM  Final   Special Requests BOTTLES DRAWN AEROBIC AND ANAEROBIC 5CC EA  Final   Culture   Final           BLOOD CULTURE RECEIVED NO GROWTH TO DATE CULTURE WILL BE HELD FOR 5 DAYS BEFORE ISSUING A FINAL NEGATIVE REPORT Performed at [facility]    Report Status  PENDING  Incomplete  MRSA PCR Screening     Status: None   Collection Time: 11/30/14  9:50 AM  Result Value Ref Range Status   MRSA by PCR NEGATIVE NEGATIVE Final    Comment:        The GeneXpert MRSA Assay (FDA approved for NASAL specimens only), is one component of a comprehensive MRSA colonization surveillance program. It is not intended to diagnose MRSA infection nor to guide or monitor treatment for MRSA infections.   Urine culture     Status: None   Collection Time: 11/30/14 12:00 PM  Result Value Ref Range Status   Specimen Description URINE, RANDOM  Final   Special Requests NONE  Final   Colony Count   Final    >=100,000 COLONIES/ML Performed at [facility]    Culture   Final    KLEBSIELLA PNEUMONIAE Performed at [facility]    Report Status 12/02/2014 FINAL  Final   Organism ID, Bacteria KLEBSIELLA PNEUMONIAE  Final      Susceptibility   Klebsiella pneumoniae - MIC*    AMPICILLIN >=32 RESISTANT Resistant     CEFAZOLIN <=4 SENSITIVE Sensitive     CEFTRIAXONE <=1 SENSITIVE Sensitive     CIPROFLOXACIN <=0.25 SENSITIVE Sensitive     GENTAMICIN <=1 SENSITIVE Sensitive     LEVOFLOXACIN <=0.12 SENSITIVE Sensitive     NITROFURANTOIN 32 SENSITIVE Sensitive     TOBRAMYCIN <=1 SENSITIVE Sensitive     TRIMETH/SULFA <=20 SENSITIVE Sensitive     PIP/TAZO 8 SENSITIVE Sensitive     * KLEBSIELLA PNEUMONIAE  Culture, sputum-assessment     Status: None   Collection Time: 12/04/14  5:24 PM  Result Value Ref Range Status   Specimen Description SPUTUM  Final   Special Requests NONE  Final   Sputum evaluation   Final    MICROSCOPIC FINDINGS SUGGEST THAT THIS SPECIMEN IS NOT REPRESENTATIVE OF LOWER RESPIRATORY SECRETIONS. PLEASE RECOLLECT. CALLED TO TIJANI,T RN 12/04/14 1820 [place]    Report Status 12/04/2014 FINAL  Final     Studies:  Recent x-ray studies have been reviewed in detail by the Attending Physician  Scheduled Meds:  Scheduled Meds: .  antiseptic oral rinse  7 mL Mouth Rinse BID  . aspirin EC  81 mg Oral Daily  . calcium-vitamin D  1 tablet Oral Q breakfast  . cefUROXime  500 mg Oral BID WC  . diltiazem  60 mg Oral Q12H  . enoxaparin (LOVENOX) injection  30 mg Subcutaneous Q24H  .  feeding supplement (ENSURE ENLIVE)  237 mL Oral TID BM  . folic acid  1 mg Oral Daily  . furosemide  20 mg Oral BID  . gefitinib  250 mg Oral Daily  . ipratropium  0.5 mg Nebulization BID  . lactose free nutrition  237 mL Oral Daily  . levalbuterol  0.63 mg Nebulization BID  . levothyroxine  88 mcg Oral QAC breakfast  . metoprolol tartrate  12.5 mg Oral BID  . omega-3 acid ethyl esters  1,000 mg Oral Daily  . pantoprazole  40 mg Oral QHS    Time spent on care of this patient: 40 mins   Jolane Bankhead, Geraldo Docker , MD  [facility] Office  [phone] Pager [phone]  On-Call/Text Page:      [email or web address]      password [email or web address]  If 7PM-7AM, please contact night-coverage [email or web address] Password [email or web address] 12/04/2014, 9:14 PM   LOS: 4 days   Care during the described time interval was provided by me .  I have reviewed this patient's available data, including medical history, events of Thomas, physical examination, and all test results as part of my evaluation. I have personally reviewed and interpreted all radiology studies.   Dia Crawford, MD [phone] Pager

## 2014-12-04 NOTE — Progress Notes (Signed)
CSW order received to assist patient with SNF placement.  Original PT order recommended Home Health PT and SNF recommendation was added yesterday. CSW met with patient who is asking for short term SNF.  She stated that she has recently been a resident of [facility] and wanted to return there for further therapy. Further discussion indicated that she as at the SNF for a little over 3 weeks and was d/cd home about 3 weeks ago. She states that she had to return home when she had to start paying her co-pays.  Patient thought that she would receive a new 20 days of insurance coverage with this hospitalization- however was notified that she would not be eligible for a new fully covered benefit period. Patient requested that CSW contact her daughter [name] who would prefer that patient return home with home health. She has received services with [facility] in the past.  CSW explained above information to daughter and she requests that patient return home with [facility].  Dr. Sherral Hammers notified and will plan to d/c patient tomorrow morning.  CSW will notify RNCM of above.  Daughter is asking for PT, OT, RN and Nurse Aide.  Patient states she really wanted to go back to SNF but remains adamant that she cannot pay the required co-pays at the nursing center. CSW will sign off.  Lorie Phenix. Pauline Good, [place]

## 2014-12-05 LAB — CBC WITH DIFFERENTIAL/PLATELET
Basophils Absolute: 0 10*3/uL (ref 0.0–0.1)
Basophils Relative: 0 % (ref 0–1)
EOS ABS: 0.1 10*3/uL (ref 0.0–0.7)
Eosinophils Relative: 2 % (ref 0–5)
HEMATOCRIT: 34.5 % — AB (ref 36.0–46.0)
HEMOGLOBIN: 11.8 g/dL — AB (ref 12.0–15.0)
Lymphocytes Relative: 9 % — ABNORMAL LOW (ref 12–46)
Lymphs Abs: 0.4 10*3/uL — ABNORMAL LOW (ref 0.7–4.0)
MCH: 35 pg — ABNORMAL HIGH (ref 26.0–34.0)
MCHC: 34.2 g/dL (ref 30.0–36.0)
MCV: 102.4 fL — AB (ref 78.0–100.0)
Monocytes Absolute: 0.7 10*3/uL (ref 0.1–1.0)
Monocytes Relative: 17 % — ABNORMAL HIGH (ref 3–12)
NEUTROS PCT: 72 % (ref 43–77)
Neutro Abs: 3 10*3/uL (ref 1.7–7.7)
PLATELETS: 123 10*3/uL — AB (ref 150–400)
RBC: 3.37 MIL/uL — ABNORMAL LOW (ref 3.87–5.11)
RDW: 19.6 % — ABNORMAL HIGH (ref 11.5–15.5)
WBC: 4.2 10*3/uL (ref 4.0–10.5)

## 2014-12-05 LAB — COMPREHENSIVE METABOLIC PANEL
ALBUMIN: 2.8 g/dL — AB (ref 3.5–5.0)
ALK PHOS: 118 U/L (ref 38–126)
ALT: 18 U/L (ref 14–54)
AST: 22 U/L (ref 15–41)
Anion gap: 8 (ref 5–15)
BUN: 67 mg/dL — ABNORMAL HIGH (ref 6–20)
CO2: 30 mmol/L (ref 22–32)
Calcium: 8.6 mg/dL — ABNORMAL LOW (ref 8.9–10.3)
Chloride: 99 mmol/L — ABNORMAL LOW (ref 101–111)
Creatinine, Ser: 1.78 mg/dL — ABNORMAL HIGH (ref 0.44–1.00)
GFR calc Af Amer: 30 mL/min — ABNORMAL LOW (ref >60)
GFR calc non Af Amer: 26 mL/min — ABNORMAL LOW (ref >60)
GLUCOSE: 84 mg/dL (ref 65–99)
Potassium: 4.8 mmol/L (ref 3.5–5.1)
Sodium: 137 mmol/L (ref 135–145)
Total Bilirubin: 1.1 mg/dL (ref 0.3–1.2)
Total Protein: 6.1 g/dL — ABNORMAL LOW (ref 6.5–8.1)

## 2014-12-05 LAB — MAGNESIUM: MAGNESIUM: 2 mg/dL (ref 1.7–2.4)

## 2014-12-05 MED ORDER — VITAMIN C 500 MG PO TABS
500.0000 mg | ORAL_TABLET | Freq: Two times a day (BID) | ORAL | Status: DC
  Administered 2014-12-05 – 2014-12-09 (×9): 500 mg via ORAL
  Filled 2014-12-05 (×10): qty 1

## 2014-12-05 MED ORDER — FUROSEMIDE 10 MG/ML IJ SOLN
40.0000 mg | Freq: Two times a day (BID) | INTRAMUSCULAR | Status: DC
  Administered 2014-12-05 – 2014-12-09 (×9): 40 mg via INTRAVENOUS
  Filled 2014-12-05 (×10): qty 4

## 2014-12-05 NOTE — Progress Notes (Addendum)
[place] TEAM 1 - Stepdown/ICU TEAM Progress Note  AMILLION SCOBEE FXT:[id number] DOB: [birth year] DOA: 11/29/2014 PCP: No primary care provider on file.  Admit HPI / Brief Narrative: 81 yo F Hx stage IV metastatic adenocarcinoma of lung (04/15/2014) on iressa daily, s/p IVC filter April 2016, hemorrhagic shock w/ rectus sheath hematoma (05/27/2014), Ulcer (06-08-13); Hypertension, Dysrhythmia,Paroxysmal a-fib, moderate to severe pulmonary hypertension, COPD, PVD, Hypothyroidism, and DVT/ PE (2008) who was taking her oral meds 5/31 and choked on a pill. EMS was activated and she was treated with bronchodilators and steroids. She was noted to be hypoxic and was initially placed on heparin for presumed PE and Afib.   HPI/Subjective: The pt notes that her legs have swollen to the point that she can not lift them to walk.  She had great difficulty trying to get to the bathroom this morning.  She denies cp, n/v, abdom pain, or current sob.  She feel "weak in general" but denies focal complaints.    Assessment/Plan:  Acute kidney failure (Cr baseline 0.9 to 1.1)  -Cr trending up significantly - Renal US this admit w/o acute findings - gently diurese and follow trend   Chronic PE / DVT - IVC filter placed 4/16 - not anticoagulation candidate secondary to bleeding and fall risk - heparin stopped - pt WAS NOT on anticoagulation prior to admit despite med rec suggesting so   Acute respiratory failure with hypoxia  -multifactorial to include aspiration pneumonitis, old DVT/PE, and pulmonary hypertension -has IVC filter in place -sputum culture unrevealing - pt afebrile w/ normal WBC and a clear episode of aspiration   R heart failure - Pulmonary hypertension -since admission + ~6 L -baseline weight appears to be as low as 76kg though variable - weight presently 87 kg -pt has severe B LE edema and very prominent JVD to angle of jaw - attempt to diurese as renal fxn allows   Severe B LE edema -must  consider possibility that IVC filter is clotted, though at present I suspect this is simple edema due to R heart failure - if does not respond to diuresis will consider dopplers or CT eval   Moderate cirrhosis / hepatic steatosis  -noted on CT abdom March 2016 - unclear etiology - coags abnormal - check viral hep panel for completeness   Chronic atrial fibrillation -Rate fairly well controlled -Continue Cardizem as BP allows   Hypotension w/ lactic acidosis  -BP stable after volume expansion - lactic acidosis resolved  Klebsiella UTI -Continue Cefuroxime to complete 5 day course of antibiotics  Moderate protein calorie malnutrition -Nutrition consult ordered -MBS suggested dysphagia 3, thin liquids -Continue ensure between meals  Hx of blood loss anemia  -No anticoagulation -Currently no bleeding   Hypothyroid  -Continue Synthroid 88 g daily -TSH within normal limits  Stage 4 lung cancer -Continue Iressa 250 mg daily  Deconditioning -no safe for d/c home at this time   Code Status: FULL Family Communication: no family present at time of exam Disposition Plan: diurese - follow renal fxn and edema - PT/OT   Consultants: none  Procedures: 6/1 TTE - EF 60-65% - no WMA - LA severely dilated - RV systolic fxn reduced - RA severely dilated - severe Triscuspic regurg - PA pressure 31m Hg  Antibiotics: Ceftriaxone 6/3 > 6/4 Cefuroxime 6/4 >  DVT prophylaxis: SCDs  Objective: Blood pressure 97/58, pulse 111, temperature 97.5 F (36.4 C), temperature source Oral, resp. rate 19, height '5\' 6"'$  (1.676 m), weight  86.682 kg (191 lb 1.6 oz), SpO2 93 %.  Intake/Output Summary (Last 24 hours) at 12/05/14 0914 Last data filed at 12/05/14 0831  Gross per 24 hour  Intake   1277 ml  Output   2700 ml  Net  -1423 ml   Exam: General: No acute respiratory distress Lungs: bibasilar crackles - no wheeze  Cardiovascular: regular rate w/o M, G, or rub Neck:  Distended prominent  JVD to angle of jaw Abdomen: Nontender, nondistended, soft, bowel sounds positive, no rebound, no ascites, no appreciable mass Extremities: No significant cyanosis, or clubbing;  3+ edema bilateral lower extremities to pelvis   Data Reviewed: Basic Metabolic Panel:  Recent Labs Lab 11/30/14 0900 12/01/14 0248 12/02/14 0345 12/03/14 0358 12/04/14 0451 12/05/14 0252  NA  --  138 135 136 136 137  K  --  4.3 4.1 4.6 5.0 4.8  CL  --  98* 97* 99* 98* 99*  CO2  --  '27 28 27 29 30  '$ GLUCOSE  --  114* 118* 119* 102* 84  BUN  --  34* 44* 57* 65* 67*  CREATININE  --  1.18* 1.32* 1.51* 1.77* 1.78*  CALCIUM  --  8.8* 9.1 9.0 8.9 8.6*  MG 1.6*  --  2.2 2.0 2.0 2.0  PHOS 4.9*  --  4.1  --   --   --     CBC:  Recent Labs Lab 12/01/14 0248 12/02/14 0345 12/03/14 0358 12/04/14 0451 12/05/14 0252  WBC 7.9 8.8 6.0 4.1 4.2  NEUTROABS  --   --  5.2 3.0 3.0  HGB 11.7* 11.3* 11.3* 11.5* 11.8*  HCT 35.6* 34.5* 34.4* 35.0* 34.5*  MCV 105.3* 105.2* 103.3* 103.2* 102.4*  PLT 127* 136* 125* 121* 123*   Liver Function Tests:  Recent Labs Lab 12/02/14 0345 12/03/14 0358 12/04/14 0451 12/05/14 0252  AST '25 24 25 22  '$ ALT '16 17 18 18  '$ ALKPHOS 119 122 118 118  BILITOT 0.8 0.9 1.1 1.1  PROT 5.9* 5.5* 5.6* 6.1*  ALBUMIN 2.9* 2.8* 2.9* 2.8*    Coags:  Recent Labs Lab 11/30/14 0015  INR 2.23*    Recent Labs Lab 11/30/14 0900  APTT 57*    CBG:  Recent Labs Lab 11/30/14 0950 12/02/14 1149  GLUCAP 142* 102*    Recent Results (from the past 240 hour(s))  Culture, blood (routine x 2) Call MD if unable to obtain prior to antibiotics being given     Status: None (Preliminary result)   Collection Time: 11/30/14  4:20 AM  Result Value Ref Range Status   Specimen Description BLOOD RIGHT FOREARM  Final   Special Requests BOTTLES DRAWN AEROBIC AND ANAEROBIC 5CC EA  Final   Culture   Final           BLOOD CULTURE RECEIVED NO GROWTH TO DATE CULTURE WILL BE HELD FOR 5 DAYS BEFORE  ISSUING A FINAL NEGATIVE REPORT Performed at [facility]    Report Status PENDING  Incomplete  Culture, blood (routine x 2) Call MD if unable to obtain prior to antibiotics being given     Status: None (Preliminary result)   Collection Time: 11/30/14  4:20 AM  Result Value Ref Range Status   Specimen Description BLOOD RIGHT FOREARM  Final   Special Requests BOTTLES DRAWN AEROBIC AND ANAEROBIC 5CC EA  Final   Culture   Final           BLOOD CULTURE RECEIVED NO GROWTH TO DATE CULTURE WILL  BE HELD FOR 5 DAYS BEFORE ISSUING A FINAL NEGATIVE REPORT Performed at [facility]    Report Status PENDING  Incomplete  MRSA PCR Screening     Status: None   Collection Time: 11/30/14  9:50 AM  Result Value Ref Range Status   MRSA by PCR NEGATIVE NEGATIVE Final    Comment:        The GeneXpert MRSA Assay (FDA approved for NASAL specimens only), is one component of a comprehensive MRSA colonization surveillance program. It is not intended to diagnose MRSA infection nor to guide or monitor treatment for MRSA infections.   Urine culture     Status: None   Collection Time: 11/30/14 12:00 PM  Result Value Ref Range Status   Specimen Description URINE, RANDOM  Final   Special Requests NONE  Final   Colony Count   Final    >=100,000 COLONIES/ML Performed at [facility]    Culture   Final    KLEBSIELLA PNEUMONIAE Performed at [facility]    Report Status 12/02/2014 FINAL  Final   Organism ID, Bacteria KLEBSIELLA PNEUMONIAE  Final      Susceptibility   Klebsiella pneumoniae - MIC*    AMPICILLIN >=32 RESISTANT Resistant     CEFAZOLIN <=4 SENSITIVE Sensitive     CEFTRIAXONE <=1 SENSITIVE Sensitive     CIPROFLOXACIN <=0.25 SENSITIVE Sensitive     GENTAMICIN <=1 SENSITIVE Sensitive     LEVOFLOXACIN <=0.12 SENSITIVE Sensitive     NITROFURANTOIN 32 SENSITIVE Sensitive     TOBRAMYCIN <=1 SENSITIVE Sensitive     TRIMETH/SULFA <=20 SENSITIVE Sensitive      PIP/TAZO 8 SENSITIVE Sensitive     * KLEBSIELLA PNEUMONIAE  Culture, sputum-assessment     Status: None   Collection Time: 12/04/14  5:24 PM  Result Value Ref Range Status   Specimen Description SPUTUM  Final   Special Requests NONE  Final   Sputum evaluation   Final    MICROSCOPIC FINDINGS SUGGEST THAT THIS SPECIMEN IS NOT REPRESENTATIVE OF LOWER RESPIRATORY SECRETIONS. PLEASE RECOLLECT. CALLED TO TIJANI,T RN 12/04/14 1820 [place]    Report Status 12/04/2014 FINAL  Final     Studies:   Recent x-ray studies have been reviewed in detail by the Attending Physician  Scheduled Meds:  Scheduled Meds: . antiseptic oral rinse  7 mL Mouth Rinse BID  . aspirin EC  81 mg Oral Daily  . calcium-vitamin D  1 tablet Oral Q breakfast  . cefUROXime  500 mg Oral BID WC  . diltiazem  60 mg Oral Q12H  . enoxaparin (LOVENOX) injection  30 mg Subcutaneous Q24H  . feeding supplement (ENSURE ENLIVE)  237 mL Oral TID BM  . folic acid  1 mg Oral Daily  . furosemide  20 mg Oral BID  . gefitinib  250 mg Oral Daily  . ipratropium  0.5 mg Nebulization BID  . lactose free nutrition  237 mL Oral Daily  . levalbuterol  0.63 mg Nebulization BID  . levothyroxine  88 mcg Oral QAC breakfast  . metoprolol tartrate  12.5 mg Oral BID  . omega-3 acid ethyl esters  1,000 mg Oral Daily  . pantoprazole  40 mg Oral QHS  . vitamin C  500 mg Oral BID    Time spent on care of this patient: 35 mins   Yalanda Soderman T , MD   [facility] Office  [phone] Pager - Text Page per Shea Evans as per below:  On-Call/Text Page:  [email or web address]      password [email or web address]  If 7PM-7AM, please contact night-coverage [email or web address] Password [email or web address] 12/05/2014, 9:14 AM   LOS: 5 days

## 2014-12-05 NOTE — Progress Notes (Signed)
UR COMPLETED  

## 2014-12-05 NOTE — Progress Notes (Signed)
Physical Therapy Treatment Patient Details Name: Brittney Thomas MRN: [id number] DOB: 06/17/1933 Today's Date: 12/05/2014    History of Present Illness Pt is an 81 y/o female with a PMH of adenocarcinoma of lung and mets. Pt was taking oral medication and choked on a pill. When EMS arrived she was noted to be hypoxic and was placed on heparin for presumed PE and a-fib. Pt was admitted for further management.     PT Comments    Pt admitted with above diagnosis. Pt currently with functional limitations due to endurance and strength deficits. Pt will need 24 hour care at home.  States her family can help her.  [facility], [place], [place] and [facility] recommended.  Pt will benefit from skilled PT to increase their independence and safety with mobility to allow discharge to the venue listed below.    Follow Up Recommendations  Home health PT;Supervision/Assistance - 24 hour     Equipment Recommendations  None recommended by PT    Recommendations for Other Services       Precautions / Restrictions Precautions Precautions: Fall Restrictions Weight Bearing Restrictions: No    Mobility  Bed Mobility Overal bed mobility: Needs Assistance Bed Mobility: Supine to Sit     Supine to sit: Supervision     General bed mobility comments: Pt was able to get to EOB on her own.    Transfers Overall transfer level: Needs assistance Equipment used: Rolling walker (2 wheeled) Transfers: Sit to/from [facility] Sit to Stand: Min assist;+2 safety/equipment Stand pivot transfers: Min assist       General transfer comment: min assist for pt to stand and pivot from bed to 3N1.  Pt had BM and urinated.  Pt needed total assist to clean herself.  Pt was too tired to ambulate but did take a few steps with RW to recliner.  Feels sheis at her baseline.  Is safe with RW with transfers.  States she cannot afford SNf and will have to go home with her familys assist prn.      Ambulation/Gait Ambulation/Gait assistance: Min guard Ambulation Distance (Feet): 4 Feet Assistive device: Rolling walker (2 wheeled) Gait Pattern/deviations: Step-through pattern;Decreased stride length;Trunk flexed Gait velocity: Decreased Gait velocity interpretation: Below normal speed for age/gender General Gait Details: Safe with RW use.  Feel pt is most likely at her baseline.     Stairs            Wheelchair Mobility    Modified Rankin (Stroke Patients Only)       Balance Overall balance assessment: Needs assistance;History of Falls Sitting-balance support: No upper extremity supported;Feet supported Sitting balance-Leahy Scale: Fair     Standing balance support: Bilateral upper extremity supported;During functional activity Standing balance-Leahy Scale: Poor Standing balance comment: Reliant on bil UE for support. Needs RW.                     Cognition Arousal/Alertness: Awake/alert Behavior During Therapy: WFL for tasks assessed/performed Overall Cognitive Status: Within Functional Limits for tasks assessed                      Exercises      General Comments General comments (skin integrity, edema, etc.): VSS with O2 in place      Pertinent Vitals/Pain Pain Assessment: No/denies pain      Home Living  Prior Function            PT Goals (current goals can now be found in the care plan section) Progress towards PT goals: Progressing toward goals    Frequency  Min 3X/week    PT Plan Discharge plan needs to be updated    Co-evaluation             End of Session Equipment Utilized During Treatment: Gait belt;Oxygen Activity Tolerance: Patient limited by fatigue Patient left: in chair;with call bell/phone within reach     Time: 0953-1017 PT Time Calculation (min) (ACUTE ONLY): 24 min  Charges:  $Therapeutic Activity: 8-22 mins $Self Care/Home Management: 8-22                     G CodesDenice Paradise 2014/12/09, 12:31 PM Mersadez Linden,PT Acute Rehabilitation [phone] (pager)

## 2014-12-05 NOTE — Progress Notes (Signed)
   12/05/14 0608  Oxygen Therapy  SpO2 (!) 85 %  O2 Device Room Air    Checked patient without 4LNC.  Patient 85% per charted amount above.    Placed back on 4LNC.  Patient sats now 93-94%

## 2014-12-06 DIAGNOSIS — C3490 Malignant neoplasm of unspecified part of unspecified bronchus or lung: Secondary | ICD-10-CM | POA: Diagnosis present

## 2014-12-06 DIAGNOSIS — D62 Acute posthemorrhagic anemia: Secondary | ICD-10-CM | POA: Diagnosis present

## 2014-12-06 LAB — RENAL FUNCTION PANEL
ALBUMIN: 2.9 g/dL — AB (ref 3.5–5.0)
ANION GAP: 10 (ref 5–15)
BUN: 69 mg/dL — ABNORMAL HIGH (ref 6–20)
CHLORIDE: 94 mmol/L — AB (ref 101–111)
CO2: 31 mmol/L (ref 22–32)
Calcium: 8.5 mg/dL — ABNORMAL LOW (ref 8.9–10.3)
Creatinine, Ser: 1.71 mg/dL — ABNORMAL HIGH (ref 0.44–1.00)
GFR calc Af Amer: 31 mL/min — ABNORMAL LOW (ref >60)
GFR, EST NON AFRICAN AMERICAN: 27 mL/min — AB (ref >60)
Glucose, Bld: 74 mg/dL (ref 65–99)
Phosphorus: 4.4 mg/dL (ref 2.5–4.6)
Potassium: 4.5 mmol/L (ref 3.5–5.1)
SODIUM: 135 mmol/L (ref 135–145)

## 2014-12-06 LAB — CBC
HCT: 36.1 % (ref 36.0–46.0)
Hemoglobin: 11.8 g/dL — ABNORMAL LOW (ref 12.0–15.0)
MCH: 33.4 pg (ref 26.0–34.0)
MCHC: 32.7 g/dL (ref 30.0–36.0)
MCV: 102.3 fL — ABNORMAL HIGH (ref 78.0–100.0)
PLATELETS: 127 10*3/uL — AB (ref 150–400)
RBC: 3.53 MIL/uL — ABNORMAL LOW (ref 3.87–5.11)
RDW: 19.2 % — ABNORMAL HIGH (ref 11.5–15.5)
WBC: 3.7 10*3/uL — ABNORMAL LOW (ref 4.0–10.5)

## 2014-12-06 LAB — CULTURE, BLOOD (ROUTINE X 2)
CULTURE: NO GROWTH
Culture: NO GROWTH

## 2014-12-06 NOTE — Progress Notes (Signed)
Speech Language Pathology Treatment: Dysphagia  Patient Details Name: Brittney Thomas MRN: [id number] DOB: Jan 11, 1933 Today's Date: 12/06/2014 Time: 8638-1771 SLP Time Calculation (min) (ACUTE ONLY): 11 min  Assessment / Plan / Recommendation Clinical Impression  Dysphagia therapy provided during part of lunch meal. Pt heard coughing as SLP reviewed chart outside her room. She states she has been coughing "all day". Intermittent coughs continued throughout session. Reviewed MBS results and educated/encouraged her to use caution with straw taking small sips explained clinical reasoning. Per RN documentation, lung sounds unchanged remains afebrile. Recommend continue Dys 3 for energy conservation and thin liquids. Suspect possible esophageal component as she describes globus sensation and prior admissions due to difficulty taking pills. Pt has pan with expectorated mucous on table. Reviewed esophageal precautions and suggested possible GI visit if symptoms worsen. Will discharge ST at this time.   HPI Other Pertinent Information: 81 yo, never smoker, with known adenocarcinoma of lung and mets, who was taking her oral meds 5/31 and choked on a pill. EMS was activated and she was treated with bronchodilators and steroids. She was noted to be hypoxic and was placed on heparin for presumed PE and Afib. Note in 11/15 she had rectus sheath hematoma and all anticoagulation was discontinued at that time. Pt reports intermittent difficulty at home swallowing her pills, although denies dysphagia to food/drink.   Pertinent Vitals Pain Assessment: No/denies pain  SLP Plan  Continue with current plan of care    Recommendations Diet recommendations: Dysphagia 3 (mechanical soft);Thin liquid Liquids provided via: Cup;Straw Medication Administration: Whole meds with puree Supervision: Patient able to self feed;Intermittent supervision to cue for compensatory strategies Compensations: Slow rate;Small  sips/bites Postural Changes and/or Swallow Maneuvers: Seated upright 90 degrees;Upright 30-60 min after meal              Oral Care Recommendations: Oral care BID Follow up Recommendations: None Plan: Continue with current plan of care    GO     Houston Siren 12/06/2014, 1:42 PM  Orbie Pyo Colvin Caroli.Ed [employer] [phone]

## 2014-12-06 NOTE — Progress Notes (Signed)
[place] TEAM 1 - Stepdown/ICU TEAM Progress Note  Brittney Thomas QMV:[id number] DOB: Nov 12, 1932 DOA: 11/29/2014 PCP: No primary care provider on file.  Admit HPI / Brief Narrative: 81 yo ,WF PMHx stage IV Metastatic adenocarcinoma of lung Dx(04/15/2014), Lung cancer, main bronchus (05/03/2014), IVC filter, rectus sheath hematoma,  Hemorrhagic shock (05/27/2014),Ulcer (06-08-13); Hypertension, Dysrhythmia,Paroxysmal a-fib, Moderate to severe pulmonary hypertension, Thyroid disease; COPDAcute and chronic respiratory failure with hypoxia,, PVD, Hypothyroidism, DVT/ PE (2008);   never smoker, with known adenocarcinoma of lung and mets, dx 10/15 and on iressa daily(Dr. Julien Nordmann) who was taking her oral meds 5/31 and choked on a pill. EMS was activated and she was treated with bronchodilators and steroids. She was noted to be hypoxic and was placed on heparin for presumed PE and Afib. Note in 11/15 she had rectus sheath hematoma and all anticoagulation was discontinued at that time. PCCM called today again for hypoxia, afrvr (on cardizem). She is awake and alert, will admit to ICU, stop heparin for now and most likely dc VQ scan.She remains a full code. We will admit and most likely she can go to sdu in 24 hours. NOTE SHE HAD IVC FILTER PLACED IN 4/16.    HPI/Subjective: 6/7 A/O x 4 , patient states feels ready for discharge. States ambulated to chair and bathroom and back today. States legs are still very heavy. States her dry weight at home is ~162 pounds (73.4 kg). States had several episodes of soft stools (not like previous episodes of C. difficile).    Assessment/Plan: Chronic PE/DVT, - IVC filter placed in 4/16 - not anticoagulatin candidate secondary to bleeding and faill risk   Acute respiratory failure with hypoxia  -Most likely multifactorial to include aspiration pneumonia, old DVT/PE, and pulmonary hypertension.  -Patient has IVC filter in place. See chronic PE/DVT -Sputum culture  pending  Dilated cardiomyopathy -Continue metoprolol 12.5 mg  BID . -Strict in and out; since admission; + 1.9 L -6/7 standing weight= 79.8 kg -Continue Lasix home dose of 40 mg BID  Chronic atrial fibrillation -Rate fairly well controlled, would not increase any of her medication currently  -Continue Cardizem 60 mg PO BID  Borderline Pulmonary hypertension -See chronic atrial fibrillation/dilated cardiomyopathy  Hypotension -KVO fluids   Acute kidney failure ( Cr baseline 0.9 to 1.1) , with lactic acidosis -Lactic acidosis resolved -Cr trending as patient diuresis  Klebsiella  UTI -Continue Cefuroxime and complete 5 day course of antibiotics  Moderate protein calorie malnutrition -Nutrition consult ordered -Patient passed MBS today dysphagia 3, thin liquids -Encourage patient to finish all 3 meals -Continue ensure between meals  Blood loss anemia  -No anticoagulation - Currently no bleeding  -Monitor closely  Hypothyroid  -Continue Synthroid 88 g daily -TSH; within normal limit  stage 4 lung cancer -Continue chemo iressa 250 mg daily  Deconditioning -Although patient would benefit from SNF, insurance denied request for SNF       Code Status: FULL Family Communication: None Disposition Plan: Discharge in next 24-48 hour's    Consultants:   Procedure/Significant Events: 6/1 echocardiogram;- LVEF=60% to 65%.-  Left atrium: severely dilated. - Right ventricle:  severely dilated.- Right atrium: severely dilated. - Tricuspid valve severe regurgitation.- Pulmonic valve: moderate regurgitation. - Pulmonary arteries: PA peak pressure: 31 mm Hg (S).   Culture   Antibiotics: Ceftriaxone 6/3>> stopped 6/4 Cefuroxime 6/4>>  DVT prophylaxis: SCD   Devices    LINES / TUBES:      Continuous Infusions:    Objective:  VITAL SIGNS: Temp: 97.5 F (36.4 C) (06/07 0542) Temp Source: Oral (06/07 0542) BP: 92/54 mmHg (06/07 1436) Pulse Rate: 91  (06/07 1436) SPO2; FIO2:   Intake/Output Summary (Last 24 hours) at 12/06/14 1647 Last data filed at 12/06/14 1548  Gross per 24 hour  Intake   1020 ml  Output   4051 ml  Net  -3031 ml     Exam: General: A/O  X 4,  No acute respiratory distress Eyes: Negative headache, eye pain, double vision, retinal hemorrhage ENT: Negative Runny nose, negative ear pain, negative tinnitus, negative gingival bleeding Neck:  Negative scars, masses, torticollis, lymphadenopathy, JVD Lungs: diffuse expiratory wheezing, negative crackles Cardiovascular: Irregular irregular rhythm and rate, without murmur gallop or rub normal S1 and S2 Abdomen: negative abdominal pain, negative dysphagia, Nontender, nondistended, soft, bowel sounds positive, no rebound, no ascites, no appreciable mass Extremities: No significant cyanosis, clubbing. Bilateral pedal edema 2+ to knees Lt> Rt, but improving  Psychiatric:  Negative depression, negative anxiety, negative fatigue, negative mania  Neurologic:  Cranial nerves II through XII intact, tongue/uvula midline, all extremities muscle strength 5/5, sensation intact throughout, negative dysarthria, negative expressive aphasia, negative receptive aphasia.      Data Reviewed: Basic Metabolic Panel:  Recent Labs Lab 11/30/14 0900  12/02/14 0345 12/03/14 0358 12/04/14 0451 12/05/14 0252 12/06/14 0408  NA  --   < > 135 136 136 137 135  K  --   < > 4.1 4.6 5.0 4.8 4.5  CL  --   < > 97* 99* 98* 99* 94*  CO2  --   < > '28 27 29 30 31  '$ GLUCOSE  --   < > 118* 119* 102* 84 74  BUN  --   < > 44* 57* 65* 67* 69*  CREATININE  --   < > 1.32* 1.51* 1.77* 1.78* 1.71*  CALCIUM  --   < > 9.1 9.0 8.9 8.6* 8.5*  MG 1.6*  --  2.2 2.0 2.0 2.0  --   PHOS 4.9*  --  4.1  --   --   --  4.4  < > = values in this interval not displayed. Liver Function Tests:  Recent Labs Lab 12/02/14 0345 12/03/14 0358 12/04/14 0451 12/05/14 0252 12/06/14 0408  AST '25 24 25 22  '$ --   ALT '16  17 18 18  '$ --   ALKPHOS 119 122 118 118  --   BILITOT 0.8 0.9 1.1 1.1  --   PROT 5.9* 5.5* 5.6* 6.1*  --   ALBUMIN 2.9* 2.8* 2.9* 2.8* 2.9*   No results for input(s): LIPASE, AMYLASE in the last 168 hours. No results for input(s): AMMONIA in the last 168 hours. CBC:  Recent Labs Lab 12/02/14 0345 12/03/14 0358 12/04/14 0451 12/05/14 0252 12/06/14 0408  WBC 8.8 6.0 4.1 4.2 3.7*  NEUTROABS  --  5.2 3.0 3.0  --   HGB 11.3* 11.3* 11.5* 11.8* 11.8*  HCT 34.5* 34.4* 35.0* 34.5* 36.1  MCV 105.2* 103.3* 103.2* 102.4* 102.3*  PLT 136* 125* 121* 123* 127*   Cardiac Enzymes: No results for input(s): CKTOTAL, CKMB, CKMBINDEX, TROPONINI in the last 168 hours. BNP (last 3 results)  Recent Labs  06/26/14 2353 11/30/14 0015  BNP 620.7* 477.8*    ProBNP (last 3 results) No results for input(s): PROBNP in the last 8760 hours.  CBG:  Recent Labs Lab 11/30/14 0950 12/02/14 1149  GLUCAP 142* 102*    Recent Results (from the past 240  hour(s))  Culture, blood (routine x 2) Call MD if unable to obtain prior to antibiotics being given     Status: None   Collection Time: 11/30/14  4:20 AM  Result Value Ref Range Status   Specimen Description BLOOD RIGHT FOREARM  Final   Special Requests BOTTLES DRAWN AEROBIC AND ANAEROBIC 5CC EA  Final   Culture   Final    NO GROWTH 5 DAYS Performed at [facility]    Report Status 12/06/2014 FINAL  Final  Culture, blood (routine x 2) Call MD if unable to obtain prior to antibiotics being given     Status: None   Collection Time: 11/30/14  4:20 AM  Result Value Ref Range Status   Specimen Description BLOOD RIGHT FOREARM  Final   Special Requests BOTTLES DRAWN AEROBIC AND ANAEROBIC 5CC EA  Final   Culture   Final    NO GROWTH 5 DAYS Performed at [facility]    Report Status 12/06/2014 FINAL  Final  MRSA PCR Screening     Status: None   Collection Time: 11/30/14  9:50 AM  Result Value Ref Range Status   MRSA by PCR NEGATIVE  NEGATIVE Final    Comment:        The GeneXpert MRSA Assay (FDA approved for NASAL specimens only), is one component of a comprehensive MRSA colonization surveillance program. It is not intended to diagnose MRSA infection nor to guide or monitor treatment for MRSA infections.   Urine culture     Status: None   Collection Time: 11/30/14 12:00 PM  Result Value Ref Range Status   Specimen Description URINE, RANDOM  Final   Special Requests NONE  Final   Colony Count   Final    >=100,000 COLONIES/ML Performed at [facility]    Culture   Final    KLEBSIELLA PNEUMONIAE Performed at [facility]    Report Status 12/02/2014 FINAL  Final   Organism ID, Bacteria KLEBSIELLA PNEUMONIAE  Final      Susceptibility   Klebsiella pneumoniae - MIC*    AMPICILLIN >=32 RESISTANT Resistant     CEFAZOLIN <=4 SENSITIVE Sensitive     CEFTRIAXONE <=1 SENSITIVE Sensitive     CIPROFLOXACIN <=0.25 SENSITIVE Sensitive     GENTAMICIN <=1 SENSITIVE Sensitive     LEVOFLOXACIN <=0.12 SENSITIVE Sensitive     NITROFURANTOIN 32 SENSITIVE Sensitive     TOBRAMYCIN <=1 SENSITIVE Sensitive     TRIMETH/SULFA <=20 SENSITIVE Sensitive     PIP/TAZO 8 SENSITIVE Sensitive     * KLEBSIELLA PNEUMONIAE  Culture, sputum-assessment     Status: None   Collection Time: 12/04/14  5:24 PM  Result Value Ref Range Status   Specimen Description SPUTUM  Final   Special Requests NONE  Final   Sputum evaluation   Final    MICROSCOPIC FINDINGS SUGGEST THAT THIS SPECIMEN IS NOT REPRESENTATIVE OF LOWER RESPIRATORY SECRETIONS. PLEASE RECOLLECT. CALLED TO TIJANI,T RN 12/04/14 1820 [place]    Report Status 12/04/2014 FINAL  Final     Studies:  Recent x-ray studies have been reviewed in detail by the Attending Physician  Scheduled Meds:  Scheduled Meds: . antiseptic oral rinse  7 mL Mouth Rinse BID  . aspirin EC  81 mg Oral Daily  . calcium-vitamin D  1 tablet Oral Q breakfast  . cefUROXime  500 mg Oral  BID WC  . diltiazem  60 mg Oral Q12H  . enoxaparin (LOVENOX) injection  30 mg Subcutaneous  Q24H  . feeding supplement (ENSURE ENLIVE)  237 mL Oral TID BM  . folic acid  1 mg Oral Daily  . furosemide  40 mg Intravenous Q12H  . gefitinib  250 mg Oral Daily  . lactose free nutrition  237 mL Oral Daily  . levothyroxine  88 mcg Oral QAC breakfast  . metoprolol tartrate  12.5 mg Oral BID  . omega-3 acid ethyl esters  1,000 mg Oral Daily  . pantoprazole  40 mg Oral QHS  . vitamin C  500 mg Oral BID    Time spent on care of this patient: 40 mins   Kanon Novosel, Geraldo Docker , MD  [facility] Office  [phone] Pager [phone]  On-Call/Text Page:      [email or web address]      password [email or web address]  If 7PM-7AM, please contact night-coverage [email or web address] Password [email or web address] 12/06/2014, 4:47 PM   LOS: 6 days   Care during the described time interval was provided by me .  I have reviewed this patient's available data, including medical history, events of note, physical examination, and all test results as part of my evaluation. I have personally reviewed and interpreted all radiology studies.   Dia Crawford, MD [phone] Pager

## 2014-12-06 NOTE — Progress Notes (Signed)
Patient had a couple of loose stools today and C-diff protocol initiated. MD aware.

## 2014-12-06 NOTE — Progress Notes (Signed)
Occupational Therapy Treatment Patient Details Name: ATIYA YERA MRN: [id number] DOB: 1933-02-03 Today's Date: 12/06/2014    History of present illness Pt is an 81 y/o female with a PMH of adenocarcinoma of lung and mets. Pt was taking oral medication and choked on a pill. When EMS arrived she was noted to be hypoxic and was placed on heparin for presumed PE and a-fib. Pt was admitted for further management.    OT comments  Pt performing toilet transfers with less assist (min guard with RW). Continues to need max assist for LB ADL and pericare after urinary incontinence. Will need maximum home health services including [place] aide to decrease burden of care.  Follow Up Recommendations  Home health OT;Supervision/Assistance - 24 hour    Equipment Recommendations  None recommended by OT    Recommendations for Other Services      Precautions / Restrictions Precautions Precautions: Fall       Mobility Bed Mobility   Bed Mobility: Supine to Sit     Supine to sit: Supervision     General bed mobility comments: Pt was able to get to EOB on her own with use of rail and extra time  Transfers Overall transfer level: Needs assistance Equipment used: Rolling walker (2 wheeled) Transfers: Sit to/from Stand Sit to Stand: Min guard         General transfer comment: good technique with RW    Balance                                   ADL Overall ADL's : Needs assistance/impaired                 Upper Body Dressing : Minimal assistance;Sitting   Lower Body Dressing: Maximal assistance;Sitting/lateral leans   Toilet Transfer: Min guard;BSC;RW;Ambulation   Toileting- Clothing Manipulation and Hygiene: Maximal assistance;Sit to/from stand       Functional mobility during ADLs: Min guard;Rolling walker General ADL Comments: Pt with urinary incontinence. Assisted to change gown and socks, wash LEs.        Vision                     Perception      Praxis      Cognition   Behavior During Therapy: WFL for tasks assessed/performed Overall Cognitive Status: Within Functional Limits for tasks assessed                       Extremity/Trunk Assessment               Exercises     Shoulder Instructions       General Comments      Pertinent Vitals/ Pain       Pain Assessment: No/denies pain  Home Living                                          Prior Functioning/Environment              Frequency Min 2X/week     Progress Toward Goals  OT Goals(current goals can now be found in the care plan section)  Progress towards OT goals: Progressing toward goals  Acute Rehab OT Goals Patient Stated Goal: Return home Time For Goal Achievement: 12/15/14 Potential to Achieve Goals:  Good  Plan Discharge plan remains appropriate    Co-evaluation                 End of Session Equipment Utilized During Treatment: Rolling walker   Activity Tolerance Patient tolerated treatment well   Patient Left in chair;with call bell/phone within reach   Nurse Communication  (pt with BM)        Time: 1000-1031 OT Time Calculation (min): 31 min  Charges: OT General Charges $OT Visit: 1 Procedure OT Treatments $Self Care/Home Management : 23-37 mins  Malka So 12/06/2014, 10:38 AM  [phone]

## 2014-12-07 DIAGNOSIS — L8990 Pressure ulcer of unspecified site, unspecified stage: Secondary | ICD-10-CM | POA: Diagnosis present

## 2014-12-07 DIAGNOSIS — A0472 Enterocolitis due to Clostridium difficile, not specified as recurrent: Secondary | ICD-10-CM | POA: Diagnosis not present

## 2014-12-07 DIAGNOSIS — J9601 Acute respiratory failure with hypoxia: Secondary | ICD-10-CM

## 2014-12-07 DIAGNOSIS — I2782 Chronic pulmonary embolism: Secondary | ICD-10-CM

## 2014-12-07 DIAGNOSIS — D62 Acute posthemorrhagic anemia: Secondary | ICD-10-CM

## 2014-12-07 DIAGNOSIS — A047 Enterocolitis due to Clostridium difficile: Secondary | ICD-10-CM

## 2014-12-07 LAB — COMPREHENSIVE METABOLIC PANEL
ALK PHOS: 119 U/L (ref 38–126)
ALT: 20 U/L (ref 14–54)
AST: 26 U/L (ref 15–41)
Albumin: 2.6 g/dL — ABNORMAL LOW (ref 3.5–5.0)
Anion gap: 9 (ref 5–15)
BUN: 61 mg/dL — ABNORMAL HIGH (ref 6–20)
CHLORIDE: 94 mmol/L — AB (ref 101–111)
CO2: 35 mmol/L — ABNORMAL HIGH (ref 22–32)
Calcium: 8.5 mg/dL — ABNORMAL LOW (ref 8.9–10.3)
Creatinine, Ser: 1.59 mg/dL — ABNORMAL HIGH (ref 0.44–1.00)
GFR calc Af Amer: 34 mL/min — ABNORMAL LOW (ref >60)
GFR, EST NON AFRICAN AMERICAN: 30 mL/min — AB (ref >60)
Glucose, Bld: 82 mg/dL (ref 65–99)
Potassium: 3.9 mmol/L (ref 3.5–5.1)
SODIUM: 138 mmol/L (ref 135–145)
Total Bilirubin: 1.2 mg/dL (ref 0.3–1.2)
Total Protein: 5.6 g/dL — ABNORMAL LOW (ref 6.5–8.1)

## 2014-12-07 LAB — HEPATITIS PANEL, ACUTE
HCV AB: 0.2 {s_co_ratio} (ref 0.0–0.9)
Hep A IgM: NEGATIVE
Hep B C IgM: NEGATIVE
Hepatitis B Surface Ag: NEGATIVE

## 2014-12-07 LAB — CLOSTRIDIUM DIFFICILE BY PCR: Toxigenic C. Difficile by PCR: POSITIVE — AB

## 2014-12-07 LAB — MAGNESIUM: MAGNESIUM: 1.7 mg/dL (ref 1.7–2.4)

## 2014-12-07 MED ORDER — SACCHAROMYCES BOULARDII 250 MG PO CAPS
250.0000 mg | ORAL_CAPSULE | Freq: Two times a day (BID) | ORAL | Status: DC
  Administered 2014-12-07 – 2014-12-09 (×4): 250 mg via ORAL
  Filled 2014-12-07 (×5): qty 1

## 2014-12-07 MED ORDER — METRONIDAZOLE 500 MG PO TABS
500.0000 mg | ORAL_TABLET | Freq: Three times a day (TID) | ORAL | Status: DC
  Administered 2014-12-07 – 2014-12-09 (×7): 500 mg via ORAL
  Filled 2014-12-07 (×9): qty 1

## 2014-12-07 NOTE — Progress Notes (Signed)
Physical Therapy Treatment Patient Details Name: Brittney Thomas MRN: [id number] DOB: Nov 07, 1932 Today's Date: 12/07/2014    History of Present Illness 81 yo female with adenocarcinoma, lung mets, now with c-dif again after 3 months .    PT Comments    Pt was seen for assessment of current function with new c-diff diagnosis and  Having some fatigue related, discouraged.  Talked with pt about her LE strength which is improving and that her current status is temporary.  Pt is expecting to go home with therapy and is motivated to work at it.  Planning to use her walker and continue gait and strengthening for LE's as PT routine.  Follow Up Recommendations  Home health PT;Supervision/Assistance - 24 hour     Equipment Recommendations  None recommended by PT    Recommendations for Other Services       Precautions / Restrictions Precautions Precautions: Fall Restrictions Weight Bearing Restrictions: No    Mobility  Bed Mobility Overal bed mobility: Needs Assistance Bed Mobility: Supine to Sit     Supine to sit: Min guard        Transfers Overall transfer level: Needs assistance Equipment used: Rolling walker (2 wheeled) Transfers: Sit to/from [facility] Sit to Stand: Min guard;Min assist Stand pivot transfers: Min guard;Min assist          Ambulation/Gait Ambulation/Gait assistance: Min assist;Min guard Ambulation Distance (Feet): 60 Feet Assistive device: Rolling walker (2 wheeled) Gait Pattern/deviations: Step-through pattern;Decreased dorsiflexion - right;Decreased dorsiflexion - left;Trunk flexed;Wide base of support (walker gets away from her) Gait velocity: Decreased Gait velocity interpretation: Below normal speed for age/gender General Gait Details: in room for gait due to new c-diff precautions   Stairs            Wheelchair Mobility    Modified Rankin (Stroke Patients Only)       Balance Overall balance assessment: Needs  assistance       Postural control: Posterior lean Standing balance support: Bilateral upper extremity supported Standing balance-Leahy Scale: Fair Standing balance comment: fair- dynamic balance                    Cognition Arousal/Alertness: Awake/alert Behavior During Therapy: WFL for tasks assessed/performed Overall Cognitive Status: Within Functional Limits for tasks assessed                      Exercises General Exercises - Lower Extremity Ankle Circles/Pumps: AROM;Both;10 reps Quad Sets: AROM;Both;10 reps Gluteal Sets: AROM;Both;10 reps    General Comments General comments (skin integrity, edema, etc.): pulses and sats were controlled wiht O2 via nasal cannula on her telemetry unit      Pertinent Vitals/Pain Pain Assessment: No/denies pain    Home Living                      Prior Function            PT Goals (current goals can now be found in the care plan section) Acute Rehab PT Goals Patient Stated Goal: Return home Progress towards PT goals: Progressing toward goals    Frequency  Min 3X/week    PT Plan Current plan remains appropriate    Co-evaluation             End of Session Equipment Utilized During Treatment: Oxygen Activity Tolerance: Patient limited by lethargy;Patient limited by fatigue Patient left: in chair;with call bell/phone within reach     Time: [phone]  PT Time Calculation (min) (ACUTE ONLY): 23 min  Charges:  $Gait Training: 8-22 mins $Therapeutic Exercise: 8-22 mins                    G Codes:      Ramond Dial 27-Dec-2014, 4:57 PM   Mee Hives, PT MS Acute Rehab Dept. Number: [facility] [phone] and [place] [phone]

## 2014-12-07 NOTE — Progress Notes (Signed)
Progress Note  ATHLEEN FELTNER PYK:[id number] DOB: 23-Apr-1933 DOA: 11/29/2014 PCP: No primary care provider on file.  Admit HPI / Brief Narrative: 81 yo ,WF PMHx stage IV Metastatic adenocarcinoma of lung Dx(04/15/2014), Lung cancer, main bronchus (05/03/2014), IVC filter, rectus sheath hematoma,  Hemorrhagic shock (05/27/2014),Ulcer (06-08-13); Hypertension, Dysrhythmia,Paroxysmal a-fib, Moderate to severe pulmonary hypertension, Thyroid disease; COPDAcute and chronic respiratory failure with hypoxia,, PVD, Hypothyroidism, DVT/ PE (2008);   never smoker, with known adenocarcinoma of lung and mets, dx 10/15 and on iressa daily(Dr. Julien Nordmann) who was taking her oral meds 5/31 and choked on a pill. EMS was activated and she was treated with bronchodilators and steroids. She was noted to be hypoxic and was placed on heparin for presumed PE and Afib. Note in 11/15 she had rectus sheath hematoma and all anticoagulation was discontinued at that time. PCCM called today again for hypoxia, afrvr (on cardizem). She is awake and alert, will admit to ICU, stop heparin for now and most likely dc VQ scan.She remains a full code. We will admit and most likely she can go to sdu in 24 hours. NOTE SHE HAD IVC FILTER PLACED IN 4/16.    HPI/Subjective: Had multiple episodes of diarrhea, tested positive for C. difficile colitis. Per patient this is her second time to have C. difficile, I couldn't find positive stools from before.  Assessment/Plan:  C. difficile colitis -Patient developed diarrhea yesterday, tested positive for C. difficile colitis. -Patient reported this is a recurrent issue for her, I did not see any positive C. difficile specimens from before. -I started her on Flagyl and probiotics, follow clinically.  Chronic PE/DVT, - IVC filter placed in 4/16 - not anticoagulatin candidate secondary to bleeding and faill risk   Acute respiratory failure with hypoxia  -Most likely multifactorial to include  aspiration pneumonia, old DVT/PE, and pulmonary hypertension.  -Patient has IVC filter in place. See chronic PE/DVT -Sputum culture pending  Dilated cardiomyopathy -Continue metoprolol 12.5 mg  BID . -Strict in and out; since admission; + 1.9 L -6/7 standing weight= 79.8 kg -Continue Lasix home dose of 40 mg BID, add TED hose.  Chronic atrial fibrillation -Rate fairly well controlled, would not increase any of her medication currently  -Continue Cardizem 60 mg PO BID  Borderline Pulmonary hypertension -See chronic atrial fibrillation/dilated cardiomyopathy  Hypotension -KVO fluids   Acute kidney failure ( Cr baseline 0.9 to 1.1) , with lactic acidosis -Lactic acidosis resolved -Cr trending as patient diuresis  Klebsiella  UTI -Continue Cefuroxime and complete 5 day course of antibiotics, discontinue all antibiotics.  Moderate protein calorie malnutrition -Nutrition consult ordered -Patient passed MBS today dysphagia 3, thin liquids -Encourage patient to finish all 3 meals -Continue ensure between meals  Blood loss anemia  -No anticoagulation - Currently no bleeding  -Monitor closely  Hypothyroid  -Continue Synthroid 88 g daily -TSH; within normal limit  stage 4 lung cancer -Continue chemo iressa 250 mg daily  Deconditioning -Although patient would benefit from SNF, insurance denied request for SNF       Code Status: FULL Family Communication: None Disposition Plan: Discharge in next 24-48 hour's    Consultants:   Procedure/Significant Events: 6/1 echocardiogram;- LVEF=60% to 65%.-  Left atrium: severely dilated. - Right ventricle:  severely dilated.- Right atrium: severely dilated. - Tricuspid valve severe regurgitation.- Pulmonic valve: moderate regurgitation. - Pulmonary arteries: PA peak pressure: 31 mm Hg (S).   Culture   Antibiotics: Ceftriaxone 6/3>> stopped 6/4 Cefuroxime 6/4>>  DVT prophylaxis: SCD  Devices    LINES /  TUBES:      Continuous Infusions:    Objective: VITAL SIGNS: Temp: 97.9 F (36.6 C) (06/08 1507) Temp Source: Oral (06/08 1507) BP: 90/62 mmHg (06/08 1507) Pulse Rate: 98 (06/08 1507) SPO2; FIO2:   Intake/Output Summary (Last 24 hours) at 12/07/14 1512 Last data filed at 12/07/14 1512  Gross per 24 hour  Intake    840 ml  Output   3200 ml  Net  -2360 ml     Exam: General: A/O  X 4,  No acute respiratory distress Eyes: Negative headache, eye pain, double vision, retinal hemorrhage ENT: Negative Runny nose, negative ear pain, negative tinnitus, negative gingival bleeding Neck:  Negative scars, masses, torticollis, lymphadenopathy, JVD Lungs: diffuse expiratory wheezing, negative crackles Cardiovascular: Irregular irregular rhythm and rate, without murmur gallop or rub normal S1 and S2 Abdomen: negative abdominal pain, negative dysphagia, Nontender, nondistended, soft, bowel sounds positive, no rebound, no ascites, no appreciable mass Extremities: No significant cyanosis, clubbing. Bilateral pedal edema 2+ to knees Lt> Rt, but improving  Psychiatric:  Negative depression, negative anxiety, negative fatigue, negative mania  Neurologic:  Cranial nerves II through XII intact, tongue/uvula midline, all extremities muscle strength 5/5, sensation intact throughout, negative dysarthria, negative expressive aphasia, negative receptive aphasia.      Data Reviewed: Basic Metabolic Panel:  Recent Labs Lab 12/02/14 0345 12/03/14 0358 12/04/14 0451 12/05/14 0252 12/06/14 0408 12/07/14 0359  NA 135 136 136 137 135 138  K 4.1 4.6 5.0 4.8 4.5 3.9  CL 97* 99* 98* 99* 94* 94*  CO2 '28 27 29 30 31 '$ 35*  GLUCOSE 118* 119* 102* 84 74 82  BUN 44* 57* 65* 67* 69* 61*  CREATININE 1.32* 1.51* 1.77* 1.78* 1.71* 1.59*  CALCIUM 9.1 9.0 8.9 8.6* 8.5* 8.5*  MG 2.2 2.0 2.0 2.0  --  1.7  PHOS 4.1  --   --   --  4.4  --    Liver Function Tests:  Recent Labs Lab 12/02/14 0345  12/03/14 0358 12/04/14 0451 12/05/14 0252 12/06/14 0408 12/07/14 0359  AST '25 24 25 22  '$ --  26  ALT '16 17 18 18  '$ --  20  ALKPHOS 119 122 118 118  --  119  BILITOT 0.8 0.9 1.1 1.1  --  1.2  PROT 5.9* 5.5* 5.6* 6.1*  --  5.6*  ALBUMIN 2.9* 2.8* 2.9* 2.8* 2.9* 2.6*   No results for input(s): LIPASE, AMYLASE in the last 168 hours. No results for input(s): AMMONIA in the last 168 hours. CBC:  Recent Labs Lab 12/02/14 0345 12/03/14 0358 12/04/14 0451 12/05/14 0252 12/06/14 0408  WBC 8.8 6.0 4.1 4.2 3.7*  NEUTROABS  --  5.2 3.0 3.0  --   HGB 11.3* 11.3* 11.5* 11.8* 11.8*  HCT 34.5* 34.4* 35.0* 34.5* 36.1  MCV 105.2* 103.3* 103.2* 102.4* 102.3*  PLT 136* 125* 121* 123* 127*   Cardiac Enzymes: No results for input(s): CKTOTAL, CKMB, CKMBINDEX, TROPONINI in the last 168 hours. BNP (last 3 results)  Recent Labs  06/26/14 2353 11/30/14 0015  BNP 620.7* 477.8*    ProBNP (last 3 results) No results for input(s): PROBNP in the last 8760 hours.  CBG:  Recent Labs Lab 12/02/14 1149  GLUCAP 102*    Recent Results (from the past 240 hour(s))  Culture, blood (routine x 2) Call MD if unable to obtain prior to antibiotics being given     Status: None   Collection Time: 11/30/14  4:20 AM  Result Value Ref Range Status   Specimen Description BLOOD RIGHT FOREARM  Final   Special Requests BOTTLES DRAWN AEROBIC AND ANAEROBIC 5CC EA  Final   Culture   Final    NO GROWTH 5 DAYS Performed at [facility]    Report Status 12/06/2014 FINAL  Final  Culture, blood (routine x 2) Call MD if unable to obtain prior to antibiotics being given     Status: None   Collection Time: 11/30/14  4:20 AM  Result Value Ref Range Status   Specimen Description BLOOD RIGHT FOREARM  Final   Special Requests BOTTLES DRAWN AEROBIC AND ANAEROBIC 5CC EA  Final   Culture   Final    NO GROWTH 5 DAYS Performed at [facility]    Report Status 12/06/2014 FINAL  Final  MRSA PCR Screening      Status: None   Collection Time: 11/30/14  9:50 AM  Result Value Ref Range Status   MRSA by PCR NEGATIVE NEGATIVE Final    Comment:        The GeneXpert MRSA Assay (FDA approved for NASAL specimens only), is one component of a comprehensive MRSA colonization surveillance program. It is not intended to diagnose MRSA infection nor to guide or monitor treatment for MRSA infections.   Urine culture     Status: None   Collection Time: 11/30/14 12:00 PM  Result Value Ref Range Status   Specimen Description URINE, RANDOM  Final   Special Requests NONE  Final   Colony Count   Final    >=100,000 COLONIES/ML Performed at [facility]    Culture   Final    KLEBSIELLA PNEUMONIAE Performed at [facility]    Report Status 12/02/2014 FINAL  Final   Organism ID, Bacteria KLEBSIELLA PNEUMONIAE  Final      Susceptibility   Klebsiella pneumoniae - MIC*    AMPICILLIN >=32 RESISTANT Resistant     CEFAZOLIN <=4 SENSITIVE Sensitive     CEFTRIAXONE <=1 SENSITIVE Sensitive     CIPROFLOXACIN <=0.25 SENSITIVE Sensitive     GENTAMICIN <=1 SENSITIVE Sensitive     LEVOFLOXACIN <=0.12 SENSITIVE Sensitive     NITROFURANTOIN 32 SENSITIVE Sensitive     TOBRAMYCIN <=1 SENSITIVE Sensitive     TRIMETH/SULFA <=20 SENSITIVE Sensitive     PIP/TAZO 8 SENSITIVE Sensitive     * KLEBSIELLA PNEUMONIAE  Culture, sputum-assessment     Status: None   Collection Time: 12/04/14  5:24 PM  Result Value Ref Range Status   Specimen Description SPUTUM  Final   Special Requests NONE  Final   Sputum evaluation   Final    MICROSCOPIC FINDINGS SUGGEST THAT THIS SPECIMEN IS NOT REPRESENTATIVE OF LOWER RESPIRATORY SECRETIONS. PLEASE RECOLLECT. CALLED TO TIJANI,T RN 12/04/14 [place]    Report Status 12/04/2014 FINAL  Final  Clostridium Difficile by PCR (not at [facility])     Status: Abnormal   Collection Time: 12/06/14  6:10 PM  Result Value Ref Range Status   C difficile by pcr POSITIVE (A) NEGATIVE  Final    Comment: CRITICAL RESULT CALLED TO, READ BACK BY AND VERIFIED WITH: TEASLEY,L RN @ [phone] 12/07/14 LEONARD,A      Studies:  Recent x-ray studies have been reviewed in detail by the Attending Physician  Scheduled Meds:  Scheduled Meds: . antiseptic oral rinse  7 mL Mouth Rinse BID  . aspirin EC  81 mg Oral Daily  . calcium-vitamin D  1 tablet  Oral Q breakfast  . cefUROXime  500 mg Oral BID WC  . diltiazem  60 mg Oral Q12H  . enoxaparin (LOVENOX) injection  30 mg Subcutaneous Q24H  . feeding supplement (ENSURE ENLIVE)  237 mL Oral TID BM  . folic acid  1 mg Oral Daily  . furosemide  40 mg Intravenous Q12H  . gefitinib  250 mg Oral Daily  . lactose free nutrition  237 mL Oral Daily  . levothyroxine  88 mcg Oral QAC breakfast  . metoprolol tartrate  12.5 mg Oral BID  . omega-3 acid ethyl esters  1,000 mg Oral Daily  . pantoprazole  40 mg Oral QHS  . vitamin C  500 mg Oral BID    Time spent on care of this patient: 40 mins   Birdie Hopes , MD  [facility] Office  [phone] Pager - [phone]  On-Call/Text Page:      [email or web address]      password [email or web address]  If 7PM-7AM, please contact night-coverage [email or web address] Password [email or web address] 12/07/2014, 3:12 PM   LOS: 7 days

## 2014-12-08 DIAGNOSIS — I824Y2 Acute embolism and thrombosis of unspecified deep veins of left proximal lower extremity: Secondary | ICD-10-CM

## 2014-12-08 DIAGNOSIS — I10 Essential (primary) hypertension: Secondary | ICD-10-CM

## 2014-12-08 DIAGNOSIS — R600 Localized edema: Secondary | ICD-10-CM

## 2014-12-08 LAB — RENAL FUNCTION PANEL
ANION GAP: 11 (ref 5–15)
Albumin: 2.7 g/dL — ABNORMAL LOW (ref 3.5–5.0)
BUN: 58 mg/dL — ABNORMAL HIGH (ref 6–20)
CALCIUM: 8.2 mg/dL — AB (ref 8.9–10.3)
CHLORIDE: 95 mmol/L — AB (ref 101–111)
CO2: 32 mmol/L (ref 22–32)
Creatinine, Ser: 1.55 mg/dL — ABNORMAL HIGH (ref 0.44–1.00)
GFR calc Af Amer: 35 mL/min — ABNORMAL LOW (ref >60)
GFR calc non Af Amer: 30 mL/min — ABNORMAL LOW (ref >60)
Glucose, Bld: 85 mg/dL (ref 65–99)
POTASSIUM: 5.3 mmol/L — AB (ref 3.5–5.1)
Phosphorus: 3.6 mg/dL (ref 2.5–4.6)
Sodium: 138 mmol/L (ref 135–145)

## 2014-12-08 LAB — MAGNESIUM: Magnesium: 1.8 mg/dL (ref 1.7–2.4)

## 2014-12-08 NOTE — Progress Notes (Signed)
Progress Note  Brittney Thomas HXT:[id number] DOB: 02-Feb-1933 DOA: 11/29/2014 PCP: No primary care provider on file.  Admit HPI / Brief Narrative: 81 yo ,WF PMHx stage IV Metastatic adenocarcinoma of lung Dx(04/15/2014), Lung cancer, main bronchus (05/03/2014), IVC filter, rectus sheath hematoma,  Hemorrhagic shock (05/27/2014),Ulcer (06-08-13); Hypertension, Dysrhythmia,Paroxysmal a-fib, Moderate to severe pulmonary hypertension, Thyroid disease; COPDAcute and chronic respiratory failure with hypoxia,, PVD, Hypothyroidism, DVT/ PE (2008);   never smoker, with known adenocarcinoma of lung and mets, dx 10/15 and on iressa daily(Dr. Julien Nordmann) who was taking her oral meds 5/31 and choked on a pill. EMS was activated and she was treated with bronchodilators and steroids. She was noted to be hypoxic and was placed on heparin for presumed PE and Afib. Note in 11/15 she had rectus sheath hematoma and all anticoagulation was discontinued at that time. PCCM called today again for hypoxia, afrvr (on cardizem). She is awake and alert, will admit to ICU, stop heparin for now and most likely dc VQ scan.She remains a full code. We will admit and most likely she can go to sdu in 24 hours. NOTE SHE HAD IVC FILTER PLACED IN 4/16.    HPI/Subjective: Diarrhea slows down, started to have semi-formed stools.  Assessment/Plan:  C. difficile colitis -Patient developed diarrhea yesterday, tested positive for C. difficile colitis. -Patient reported this is a recurrent issue for her, had previous episodes about 2 months ago in [facility]. -I started her on Flagyl and probiotics, already showing some clinical improvement.  Chronic PE/DVT, - IVC filter placed in 4/16 - not anticoagulatin candidate secondary to bleeding and fall risk   Acute respiratory failure with hypoxia  -Most likely multifactorial to include aspiration pneumonia, old DVT/PE, and pulmonary hypertension.  -Patient has IVC filter in  place. See chronic PE/DVT -Sputum culture pending  Dilated cardiomyopathy -Continue metoprolol 12.5 mg  BID . -Strict in and out; since admission; + 1.9 L -6/7 standing weight= 79.8 kg -Continue Lasix home dose of 40 mg BID, add TED hose. Elevate lower extremity  Chronic atrial fibrillation -Rate fairly well controlled, would not increase any of her medication currently  -Continue Cardizem 60 mg PO BID  Borderline Pulmonary hypertension -See chronic atrial fibrillation/dilated cardiomyopathy  Hypotension -KVO fluids   Acute kidney failure ( Cr baseline 0.9 to 1.1) , with lactic acidosis -Lactic acidosis resolved -Cr trending as patient diuresis  Klebsiella  UTI -Continue Cefuroxime and complete 5 day course of antibiotics, discontinue all antibiotics.  Moderate protein calorie malnutrition -Nutrition consult ordered -Patient passed MBS today dysphagia 3, thin liquids -Encourage patient to finish all 3 meals -Continue ensure between meals  Blood loss anemia  -No anticoagulation - Currently no bleeding  -Monitor closely  Hypothyroid  -Continue Synthroid 88 g daily -TSH; within normal limit  stage 4 lung cancer -Continue chemo iressa 250 mg daily  Deconditioning -Although patient would benefit from SNF, insurance denied request for SNF       Code Status: FULL Family Communication: None Disposition Plan: Discharge in next 24-48 hour's    Consultants:   Procedure/Significant Events: 6/1 echocardiogram;- LVEF=60% to 65%.-  Left atrium: severely dilated. - Right ventricle:  severely dilated.- Right atrium: severely dilated. - Tricuspid valve severe regurgitation.- Pulmonic valve: moderate regurgitation. - Pulmonary arteries: PA peak pressure: 31 mm Hg (S).   Culture   Antibiotics: Ceftriaxone 6/3>> stopped 6/4 Cefuroxime 6/4>>  DVT prophylaxis: SCD   Devices    LINES / TUBES:      Continuous  Infusions:    Objective: VITAL  SIGNS: Temp: 97.8 F (36.6 C) (06/09 1340) Temp Source: Oral (06/09 1340) BP: 99/67 mmHg (06/09 1340) Pulse Rate: 99 (06/09 1340) SPO2; FIO2:   Intake/Output Summary (Last 24 hours) at 12/08/14 1558 Last data filed at 12/08/14 1336  Gross per 24 hour  Intake   1017 ml  Output   3150 ml  Net  -2133 ml     Exam: General: A/O  X 4,  No acute respiratory distress Eyes: Negative headache, eye pain, double vision, retinal hemorrhage ENT: Negative Runny nose, negative ear pain, negative tinnitus, negative gingival bleeding Neck:  Negative scars, masses, torticollis, lymphadenopathy, JVD Lungs: diffuse expiratory wheezing, negative crackles Cardiovascular: Irregular irregular rhythm and rate, without murmur gallop or rub normal S1 and S2 Abdomen: negative abdominal pain, negative dysphagia, Nontender, nondistended, soft, bowel sounds positive, no rebound, no ascites, no appreciable mass Extremities: No significant cyanosis, clubbing. Bilateral pedal edema 2+ to knees Lt> Rt, but improving  Psychiatric:  Negative depression, negative anxiety, negative fatigue, negative mania  Neurologic:  Cranial nerves II through XII intact, tongue/uvula midline, all extremities muscle strength 5/5, sensation intact throughout, negative dysarthria, negative expressive aphasia, negative receptive aphasia.      Data Reviewed: Basic Metabolic Panel:  Recent Labs Lab 12/02/14 0345 12/03/14 9924 12/04/14 0451 12/05/14 0252 12/06/14 0408 12/07/14 0359 12/08/14 0405 12/08/14 0914  NA 135 136 136 137 135 138  --  138  K 4.1 4.6 5.0 4.8 4.5 3.9  --  5.3*  CL 97* 99* 98* 99* 94* 94*  --  95*  CO2 '28 27 29 30 31 '$ 35*  --  32  GLUCOSE 118* 119* 102* 84 74 82  --  85  BUN 44* 57* 65* 67* 69* 61*  --  58*  CREATININE 1.32* 1.51* 1.77* 1.78* 1.71* 1.59*  --  1.55*  CALCIUM 9.1 9.0 8.9 8.6* 8.5* 8.5*  --  8.2*  MG 2.2 2.0 2.0 2.0  --  1.7 1.8  --   PHOS 4.1  --   --   --  4.4  --   --  3.6   Liver  Function Tests:  Recent Labs Lab 12/02/14 0345 12/03/14 0358 12/04/14 0451 12/05/14 0252 12/06/14 0408 12/07/14 0359 12/08/14 0914  AST '25 24 25 22  '$ --  26  --   ALT '16 17 18 18  '$ --  20  --   ALKPHOS 119 122 118 118  --  119  --   BILITOT 0.8 0.9 1.1 1.1  --  1.2  --   PROT 5.9* 5.5* 5.6* 6.1*  --  5.6*  --   ALBUMIN 2.9* 2.8* 2.9* 2.8* 2.9* 2.6* 2.7*   No results for input(s): LIPASE, AMYLASE in the last 168 hours. No results for input(s): AMMONIA in the last 168 hours. CBC:  Recent Labs Lab 12/02/14 0345 12/03/14 0358 12/04/14 0451 12/05/14 0252 12/06/14 0408  WBC 8.8 6.0 4.1 4.2 3.7*  NEUTROABS  --  5.2 3.0 3.0  --   HGB 11.3* 11.3* 11.5* 11.8* 11.8*  HCT 34.5* 34.4* 35.0* 34.5* 36.1  MCV 105.2* 103.3* 103.2* 102.4* 102.3*  PLT 136* 125* 121* 123* 127*   Cardiac Enzymes: No results for input(s): CKTOTAL, CKMB, CKMBINDEX, TROPONINI in the last 168 hours. BNP (last 3 results)  Recent Labs  06/26/14 2353 11/30/14 0015  BNP 620.7* 477.8*    ProBNP (last 3 results) No results for input(s): PROBNP in the last 8760 hours.  CBG:  Recent Labs Lab 12/02/14 1149  GLUCAP 102*    Recent Results (from the past 240 hour(s))  Culture, blood (routine x 2) Call MD if unable to obtain prior to antibiotics being given     Status: None   Collection Time: 11/30/14  4:20 AM  Result Value Ref Range Status   Specimen Description BLOOD RIGHT FOREARM  Final   Special Requests BOTTLES DRAWN AEROBIC AND ANAEROBIC 5CC EA  Final   Culture   Final    NO GROWTH 5 DAYS Performed at [facility]    Report Status 12/06/2014 FINAL  Final  Culture, blood (routine x 2) Call MD if unable to obtain prior to antibiotics being given     Status: None   Collection Time: 11/30/14  4:20 AM  Result Value Ref Range Status   Specimen Description BLOOD RIGHT FOREARM  Final   Special Requests BOTTLES DRAWN AEROBIC AND ANAEROBIC 5CC EA  Final   Culture   Final    NO GROWTH 5  DAYS Performed at [facility]    Report Status 12/06/2014 FINAL  Final  MRSA PCR Screening     Status: None   Collection Time: 11/30/14  9:50 AM  Result Value Ref Range Status   MRSA by PCR NEGATIVE NEGATIVE Final    Comment:        The GeneXpert MRSA Assay (FDA approved for NASAL specimens only), is one component of a comprehensive MRSA colonization surveillance program. It is not intended to diagnose MRSA infection nor to guide or monitor treatment for MRSA infections.   Urine culture     Status: None   Collection Time: 11/30/14 12:00 PM  Result Value Ref Range Status   Specimen Description URINE, RANDOM  Final   Special Requests NONE  Final   Colony Count   Final    >=100,000 COLONIES/ML Performed at [facility]    Culture   Final    KLEBSIELLA PNEUMONIAE Performed at [facility]    Report Status 12/02/2014 FINAL  Final   Organism ID, Bacteria KLEBSIELLA PNEUMONIAE  Final      Susceptibility   Klebsiella pneumoniae - MIC*    AMPICILLIN >=32 RESISTANT Resistant     CEFAZOLIN <=4 SENSITIVE Sensitive     CEFTRIAXONE <=1 SENSITIVE Sensitive     CIPROFLOXACIN <=0.25 SENSITIVE Sensitive     GENTAMICIN <=1 SENSITIVE Sensitive     LEVOFLOXACIN <=0.12 SENSITIVE Sensitive     NITROFURANTOIN 32 SENSITIVE Sensitive     TOBRAMYCIN <=1 SENSITIVE Sensitive     TRIMETH/SULFA <=20 SENSITIVE Sensitive     PIP/TAZO 8 SENSITIVE Sensitive     * KLEBSIELLA PNEUMONIAE  Culture, sputum-assessment     Status: None   Collection Time: 12/04/14  5:24 PM  Result Value Ref Range Status   Specimen Description SPUTUM  Final   Special Requests NONE  Final   Sputum evaluation   Final    MICROSCOPIC FINDINGS SUGGEST THAT THIS SPECIMEN IS NOT REPRESENTATIVE OF LOWER RESPIRATORY SECRETIONS. PLEASE RECOLLECT. CALLED TO TIJANI,T RN 12/04/14 [place]    Report Status 12/04/2014 FINAL  Final  Clostridium Difficile by PCR (not at [facility])     Status: Abnormal    Collection Time: 12/06/14  6:10 PM  Result Value Ref Range Status   C difficile by pcr POSITIVE (A) NEGATIVE Final    Comment: CRITICAL RESULT CALLED TO, READ BACK BY AND VERIFIED WITH: TEASLEY,L RN @ [phone] 12/07/14 LEONARD,A  Studies:  Recent x-ray studies have been reviewed in detail by the Attending Physician  Scheduled Meds:  Scheduled Meds: . antiseptic oral rinse  7 mL Mouth Rinse BID  . aspirin EC  81 mg Oral Daily  . calcium-vitamin D  1 tablet Oral Q breakfast  . diltiazem  60 mg Oral Q12H  . enoxaparin (LOVENOX) injection  30 mg Subcutaneous Q24H  . feeding supplement (ENSURE ENLIVE)  237 mL Oral TID BM  . folic acid  1 mg Oral Daily  . furosemide  40 mg Intravenous Q12H  . gefitinib  250 mg Oral Daily  . lactose free nutrition  237 mL Oral Daily  . levothyroxine  88 mcg Oral QAC breakfast  . metoprolol tartrate  12.5 mg Oral BID  . metroNIDAZOLE  500 mg Oral TID  . omega-3 acid ethyl esters  1,000 mg Oral Daily  . pantoprazole  40 mg Oral QHS  . saccharomyces boulardii  250 mg Oral BID  . vitamin C  500 mg Oral BID    Time spent on care of this patient: 40 mins   Birdie Hopes , MD  [facility] Office  [phone] Pager - [phone]  On-Call/Text Page:      [email or web address]      password [email or web address]  If 7PM-7AM, please contact night-coverage [email or web address] Password [email or web address] 12/08/2014, 3:58 PM   LOS: 8 days

## 2014-12-08 NOTE — Progress Notes (Signed)
Occupational Therapy Treatment Patient Details Name: Brittney Thomas MRN: [id number] DOB: [birth year] Today's Date: 12/08/2014    History of present illness 81 yo female with adenocarcinoma, lung mets, now with c-dif again after 3 months .   OT comments  Pt less edematous and moving with much less effort. Hopeful to discharge home tomorrow.  Able to get to EOB without physical assistance, stand at the sink for grooming x 2 activities and ambulate to bathroom for toileting with 3 in 1 over the toilet. Pt requesting to walk in her room in addition to ADL. Tolerated well.  Follow Up Recommendations  Home health OT;Supervision/Assistance - 24 hour    Equipment Recommendations  None recommended by OT    Recommendations for Other Services      Precautions / Restrictions Precautions Precautions: Fall       Mobility Bed Mobility Overal bed mobility: Needs Assistance Bed Mobility: Supine to Sit     Supine to sit: Supervision     General bed mobility comments: Moving much more efficiently without need for rest break.  Transfers Overall transfer level: Needs assistance Equipment used: Rolling walker (2 wheeled)   Sit to Stand: Supervision         General transfer comment: good technique with RW    Balance                                   ADL Overall ADL's : Needs assistance/impaired     Grooming: Wash/dry hands;Brushing hair;Supervision/safety;Standing           Upper Body Dressing : Set up;Sitting   Lower Body Dressing: Sit to/from stand;Moderate assistance   Toilet Transfer: Min guard;BSC;RW;Ambulation   Toileting- Clothing Manipulation and Hygiene: Minimal assistance;Sit to/from stand       Functional mobility during ADLs: Min guard;Rolling walker General ADL Comments: Pt eager to get OOB. Ambulated with in room with RW x 2, to [facility] and to sink.  Stood x 2 minutes for grooming activities.      Vision                     Perception      Praxis      Cognition   Behavior During Therapy: WFL for tasks assessed/performed Overall Cognitive Status: Within Functional Limits for tasks assessed                       Extremity/Trunk Assessment               Exercises     Shoulder Instructions       General Comments      Pertinent Vitals/ Pain       Pain Assessment: Faces Faces Pain Scale: Hurts a little bit Pain Location: L ankle Pain Intervention(s): Monitored during session  Home Living                                          Prior Functioning/Environment              Frequency Min 2X/week     Progress Toward Goals  OT Goals(current goals can now be found in the care plan section)  Progress towards OT goals: Progressing toward goals  Acute Rehab OT Goals Patient Stated Goal: Return home Time For Goal  Achievement: 12/15/14 Potential to Achieve Goals: Good  Plan Discharge plan remains appropriate    Co-evaluation                 End of Session Equipment Utilized During Treatment: Rolling walker;Oxygen   Activity Tolerance Patient tolerated treatment well   Patient Left in chair;with call bell/phone within reach   Nurse Communication          Time: 8677-3736 OT Time Calculation (min): 23 min  Charges: OT Treatments $Self Care/Home Management : 23-37 mins  Malka So 12/08/2014, 3:16 PM  [phone]

## 2014-12-09 DIAGNOSIS — D500 Iron deficiency anemia secondary to blood loss (chronic): Secondary | ICD-10-CM

## 2014-12-09 DIAGNOSIS — I482 Chronic atrial fibrillation: Secondary | ICD-10-CM

## 2014-12-09 LAB — RENAL FUNCTION PANEL
Albumin: 2.5 g/dL — ABNORMAL LOW (ref 3.5–5.0)
Anion gap: 10 (ref 5–15)
BUN: 53 mg/dL — ABNORMAL HIGH (ref 6–20)
CO2: 41 mmol/L — AB (ref 22–32)
CREATININE: 1.49 mg/dL — AB (ref 0.44–1.00)
Calcium: 8.3 mg/dL — ABNORMAL LOW (ref 8.9–10.3)
Chloride: 90 mmol/L — ABNORMAL LOW (ref 101–111)
GFR calc non Af Amer: 32 mL/min — ABNORMAL LOW (ref >60)
GFR, EST AFRICAN AMERICAN: 37 mL/min — AB (ref >60)
Glucose, Bld: 89 mg/dL (ref 65–99)
PHOSPHORUS: 3.4 mg/dL (ref 2.5–4.6)
POTASSIUM: 3.6 mmol/L (ref 3.5–5.1)
SODIUM: 141 mmol/L (ref 135–145)

## 2014-12-09 LAB — MAGNESIUM: Magnesium: 1.7 mg/dL (ref 1.7–2.4)

## 2014-12-09 MED ORDER — DILTIAZEM HCL ER COATED BEADS 120 MG PO CP24: 120.0000 mg | ORAL_CAPSULE | Freq: Every day | ORAL | Status: DC

## 2014-12-09 MED ORDER — POTASSIUM CHLORIDE CRYS ER 20 MEQ PO TBCR: 20.0000 meq | EXTENDED_RELEASE_TABLET | Freq: Every day | ORAL | Status: AC

## 2014-12-09 MED ORDER — METOPROLOL TARTRATE 25 MG PO TABS
12.5000 mg | ORAL_TABLET | Freq: Two times a day (BID) | ORAL | Status: AC
Start: 2014-12-09 — End: 2015-05-04

## 2014-12-09 MED ORDER — FUROSEMIDE 40 MG PO TABS: 40.0000 mg | ORAL_TABLET | Freq: Two times a day (BID) | ORAL | Status: DC

## 2014-12-09 MED ORDER — APIXABAN 2.5 MG PO TABS: 2.5000 mg | ORAL_TABLET | Freq: Two times a day (BID) | ORAL | Status: AC

## 2014-12-09 MED ORDER — METRONIDAZOLE 500 MG PO TABS: 500.0000 mg | ORAL_TABLET | Freq: Three times a day (TID) | ORAL | Status: DC

## 2014-12-09 NOTE — Progress Notes (Signed)
Talked to patient about DC home today with [facility], [facility] called and made aware of discharge for today; patient has home 02 as prior to admission; Aneta Mins [phone]

## 2014-12-09 NOTE — Discharge Summary (Signed)
Physician Discharge Summary  MEIAH ZAMUDIO MEQ:[id number] DOB: 1932-12-21 DOA: 11/29/2014  PCP: [name]  Admit date: 11/29/2014 Discharge date: 12/09/2014  Time spent: 40 minutes  Recommendations for Outpatient Follow-up:  1. Follow-up with primary care physician will week. 2. Follow-up with Dr. Julien Nordmann and 12/19/14. 3. Patient discharged on Flagyl 500 mg for 12 more days for C. difficile colitis. 4. Eliquis restarted at 2.5 mg, upon request from the daughter and she understands the risk of bleeding.  Discharge Diagnoses:  Principal Problem:   SOB (shortness of breath) Active Problems:   Chronic venous insufficiency   Adenocarcinoma, lung   Thyroid nodule   Primary hypercoagulable state   Acute venous embolism and thrombosis of deep vessels of proximal lower extremity   Lung cancer, main bronchus   Blood loss anemia   Hemorrhagic shock   Bilateral lower extremity edema   Acute and chronic respiratory failure with hypoxia   Benign essential HTN   Chronic atrial fibrillation   Moderate to severe pulmonary hypertension   Essential hypertension, benign   Other specified hypothyroidism   COPD exacerbation   History of pulmonary embolism   Choking episode   Stage 4 lung cancer   AKI (acute kidney injury)   Acute respiratory failure with hypoxia   Chronic pulmonary embolism   DVT, femoral, chronic   Dilated cardiomyopathy   Pulmonary hypertension   Other specified hypotension   Urinary tract infection in elderly patient   Protein-calorie malnutrition   PE (pulmonary embolism)   Urinary tract infectious disease   Pulmonary embolus   DVT (deep venous thrombosis)   Moderate protein-calorie malnutrition   Acute blood loss anemia   Primary lung cancer   Pressure ulcer   C. difficile colitis   Discharge Condition: Stable  Diet recommendation: Heart healthy  Filed Weights   12/07/14 0611 12/08/14 0353 12/09/14 0601  Weight: 79.34 kg (174 lb 14.6 oz) 78.4 kg (172 lb  13.5 oz) 77.747 kg (171 lb 6.4 oz)    History of present illness:  81 yo ,never smoker, with known adenocarcinoma of lung and mets, dx 10/15 and on iressa daily(Dr. Julien Nordmann) who was taking her oral meds 5/31 and choked on a pill. EMS was activated and she was treated with bronchodilators and steroids. She was noted to be hypoxic and was placed on heparin for presumed PE and Afib. Note in 11/15 she had rectus sheath hematoma and all anticoagulation was discontinued at that time. PCCM called today again for hypoxia, afrvr (on cardizem). She is awake and alert, will admit to ICU, stop heparin for now and most likely dc VQ scan.She remains a full code. We will admit and most likely she can go to sdu in 24 hours. NOTE SHE HAD IVC FILTER PLACED IN 4/16.  Hospital Course:   C. difficile colitis, recurrent -Patient developed diarrhea yesterday, tested positive for C. difficile colitis. -Patient reported this is a recurrent issue for her, had previous 3 episodes about 2 months ago in [place] [facility]. -Daughter requested Dficid to be started as she had it before, I explained to her she is doing okay on Flagyl and her stools firmed up  -Started on Flagyl for 12 more days.  Chronic PE/DVT, - IVC filter placed in 4/16 - Anticoagulation was discontinued because of the rectus sheath hematoma previously. - Patient was on Eliquis prior to admission, taken off of blood thinner by PCCM. - I had a prolonged discussion with her daughter Mrs. Wynelle Beckmann, she is an  RN, she requested Eliquis to be restarted. - She understands the risk of bleeding, but wants Eliquis to be restarted back again.  Acute respiratory failure with hypoxia  -Most likely multifactorial to include aspiration pneumonia, old DVT/PE, and pulmonary hypertension.  -Patient has IVC filter in place. See chronic PE/DVT  Dilated cardiomyopathy -Continue metoprolol 12.5 mg BID . -Strict in and out; since admission; + 1.9 L -6/7  standing weight= 79.8 kg -2-D echo showed right-sided ventricular dilatation/lower ejection fraction consistent with right-sided heart failure. -Patient is on Lasix, was on 20 mg twice a day increased to 40 mg twice a day, potassium supplements prescribed.  Chronic atrial fibrillation -Rate fairly well controlled, anticoagulated with 2.5 mg of Eliquis twice a day -Per her daughter she was on Cardizem before, this is currently controlled with Cardizem and metoprolol. -This patients CHA2DS2-VASc Score and unadjusted Ischemic Stroke Rate (% per year) is equal to 9.7 % stroke rate/year from a score of 6 Above score calculated as 1 point each if present [CHF, HTN, DM, Vascular=MI/PAD/Aortic Plaque, Age if 65-74, or Female] Above score calculated as 2 points each if present [Age > 75, or Stroke/TIA/TE]  Borderline Pulmonary hypertension -See chronic atrial fibrillation/dilated cardiomyopathy  Hypotension -Resolved.  Acute kidney failure ( Cr baseline 0.9 to 1.1) , with lactic acidosis -Lactic acidosis resolved -Cr trending as patient diuresis  Klebsiella UTI -Continue Cefuroxime and complete 5 day course of antibiotics, discontinue all antibiotics.  Moderate protein calorie malnutrition -Nutrition consult ordered -Patient passed MBS today dysphagia 3, thin liquids -Encourage patient to finish all 3 meals -Continue ensure between meals  Blood loss anemia  -No anticoagulation - Currently no bleeding  -Monitor closely  Hypothyroid  -Continue Synthroid 88 g daily -TSH; within normal limit  stage 4 lung cancer -Continue chemo Iressa 250 mg daily. -Patient supposed follow-up with Dr. Julien Nordmann for CT scan to see a response to chemotherapy on 6/14.  Deconditioning -Seen by physical therapy, did very well prior to discharge. -Recommended home health service.   Procedures: 6/1 echocardiogram;- LVEF=60% to 65%.- Left atrium: severely dilated. - Right ventricle: severely  dilated.- Right atrium: severely dilated. - Tricuspid valve severe regurgitation.- Pulmonic valve: moderate regurgitation. - Pulmonary arteries: PA peak pressure: 31 mm Hg (S).  Consultations:  None  Discharge Exam: Filed Vitals:   12/09/14 0601  BP: 115/72  Pulse: 91  Temp: 97.6 F (36.4 C)  Resp: 17   General: Alert and awake, oriented x3, not in any acute distress. HEENT: anicteric sclera, pupils reactive to light and accommodation, EOMI CVS: S1-S2 clear, no murmur rubs or gallops Chest: clear to auscultation bilaterally, no wheezing, rales or rhonchi Abdomen: soft nontender, nondistended, normal bowel sounds, no organomegaly Extremities: no cyanosis, clubbing or edema noted bilaterally Neuro: Cranial nerves II-XII intact, no focal neurological deficits  Discharge Instructions   Discharge Instructions    Diet - low sodium heart healthy    Complete by:  As directed      Increase activity slowly    Complete by:  As directed           Current Discharge Medication List    START taking these medications   Details  apixaban (ELIQUIS) 2.5 MG TABS tablet Take 1 tablet (2.5 mg total) by mouth 2 (two) times daily. Qty: 60 tablet, Refills: 0    diltiazem (CARDIZEM CD) 120 MG 24 hr capsule Take 1 capsule (120 mg total) by mouth daily. Qty: 30 capsule, Refills: 0    metroNIDAZOLE (FLAGYL) 500  MG tablet Take 1 tablet (500 mg total) by mouth 3 (three) times daily. Qty: 36 tablet, Refills: 0    potassium chloride SA (K-DUR,KLOR-CON) 20 MEQ tablet Take 1 tablet (20 mEq total) by mouth daily. Qty: 30 tablet, Refills: 0      CONTINUE these medications which have CHANGED   Details  furosemide (LASIX) 40 MG tablet Take 1 tablet (40 mg total) by mouth 2 (two) times daily. Qty: 60 tablet, Refills: 0    metoprolol tartrate (LOPRESSOR) 25 MG tablet Take 0.5 tablets (12.5 mg total) by mouth 2 (two) times daily. Qty: 60 tablet, Refills: 0      CONTINUE these medications which  have NOT CHANGED   Details  acetaminophen (TYLENOL) 500 MG tablet Take 500 mg by mouth every 6 (six) hours as needed (pain).    aspirin EC 81 MG tablet Take 1 tablet (81 mg total) by mouth daily.    benzonatate (TESSALON) 100 MG capsule Take 1 capsule (100 mg total) by mouth 3 (three) times daily. Qty: 20 capsule, Refills: 0    calcium-vitamin D (OSCAL WITH D) 500-200 MG-UNIT per tablet Take 1 tablet by mouth daily with breakfast.     folic acid (FOLVITE) 1 MG tablet Take 1 tablet (1 mg total) by mouth daily. Qty: 30 tablet, Refills: 3    HYDROcodone-acetaminophen (NORCO/VICODIN) 5-325 MG per tablet Take 1 tablet by mouth every 6 (six) hours as needed for moderate pain. Qty: 30 tablet, Refills: 0    IRESSA 250 MG tablet TAKE 1 TABLET BY MOUTH ONCE DAILY Qty: 30 tablet, Refills: 2   Associated Diagnoses: Malignant neoplasm of lung, unspecified laterality, unspecified part of lung    levothyroxine (SYNTHROID, LEVOTHROID) 88 MCG tablet Take 88 mcg by mouth daily before breakfast.    Omega-3 Fatty Acids (SALMON OIL-1000 PO) Take 1 tablet by mouth daily.     vitamin C (ASCORBIC ACID) 500 MG tablet Take 500 mg by mouth 2 (two) times daily.    ipratropium-albuterol (DUONEB) 0.5-2.5 (3) MG/3ML SOLN Take 3 mLs by nebulization every 6 (six) hours as needed. Qty: 360 mL      STOP taking these medications     guaiFENesin-dextromethorphan (ROBITUSSIN DM) 100-10 MG/5ML syrup        Allergies  Allergen Reactions  . Poison Ivy Extract [Extract Of Poison Ivy] Rash  . Sulfa Antibiotics Rash  . Sulfacetamide Sodium Rash   Follow-up Information    Follow up with [name], MD.   Specialty:  Internal Medicine   Contact information:   [place] [id number] [phone]        The results of significant diagnostics from this hospitalization (including imaging, microbiology, ancillary and laboratory) are listed below for reference.    Significant Diagnostic  Studies: US Renal Port  11/30/2014   CLINICAL DATA:  Acute kidney injury. History of hypertension, lung cancer.  EXAM: RENAL / URINARY TRACT ULTRASOUND COMPLETE  COMPARISON:  CT of the chest, abdomen and pelvis August 31, 2004  FINDINGS: Right Kidney:  Length: 10.3 cm. Increased cortical echogenicity. No mass or hydronephrosis visualized.  Left Kidney:  Length: 10 cm. Increased cortical echogenicity. No mass or hydronephrosis visualized.  Bladder:  Appears normal for degree of bladder distention. Small amount of ascites seen in the abdomen.  IMPRESSION: Echogenic kidneys can be seen with medical renal disease without obstructive uropathy.  At least mild amount of ascites.   Electronically Signed   By: Thana Farr.D.  On: 11/30/2014 06:02   Dg Chest Port 1 View  12/04/2014   CLINICAL DATA:  Left lung adenocarcinoma.  EXAM: PORTABLE CHEST - 1 VIEW  COMPARISON:  11/30/2014  FINDINGS: Left upper lobe pulmonary nodule seen on CT is not well visualized radiographically. Cardiomegaly is stable. Low lung volumes are again noted, however there is no evidence of focal consolidation or definite pleural effusion. Mild bibasilar atelectasis noted.  IMPRESSION: Low lung volumes with mild bibasilar atelectasis.  Stable cardiomegaly.   Electronically Signed   By: Earle Gell M.D.   On: 12/04/2014 09:21   Dg Chest Port 1 View  11/30/2014   CLINICAL DATA:  Shortness of breath after choking on medication tonight. History of lung cancer.  EXAM: PORTABLE CHEST - 1 VIEW  COMPARISON:  Radiographs 09/04/2014.  CT 09/01/2014  FINDINGS: Cardiomegaly is unchanged. Decreased vascular congestion from prior. The left upper lobe pulmonary nodule on prior CT is not seen radiographically. There is no consolidation, large pleural effusion or pneumothorax. No evident pneumomediastinum. Osseous structures are stable.  IMPRESSION: 1. Decreased vascular congestion.  Stable cardiomegaly. 2. No acute process.   Electronically Signed   By:  Jeb Levering M.D.   On: 11/30/2014 01:18   Dg Swallowing Func-speech Pathology  12/02/2014    Objective Swallowing Evaluation:    Patient Details  Name: ALMAS RAKE MRN: [id number] Date of Birth: 1933/01/01  Today's Date: 12/02/2014 Time: SLP Start Time (ACUTE ONLY): 1208-SLP Stop Time (ACUTE ONLY): 1222 SLP Time Calculation (min) (ACUTE ONLY): 14 min  Past Medical History:  Past Medical History  Diagnosis Date  . Ulcer 06-08-13    Left medial ankle  . Hypertension   . Thyroid disease     Hypo-Thyroidism  . Esophageal reflux   . COPD (chronic obstructive pulmonary disease)   . Peripheral vascular disease   . Hypothyroidism   . Insomnia     takes lorazepam  . Arthritis   . Dysrhythmia   . DVT (deep venous thrombosis) 2008  . Paroxysmal a-fib 2008    Anticoag stopped due to rectus sheath bleed  . PE (pulmonary thromboembolism) 2008  . Wears dentures   . Moderate to severe pulmonary hypertension   . Acute and chronic respiratory failure with hypoxia 06/14/2014    December 2015 2 L of oxygen continuously   . Lung cancer, main bronchus 05/03/2014    LUL, non small cell   . Adenocarcinoma, lung 04/15/2014    EBUS 04/20/14 >NON small cell carcinoma , adenocarcinoma ;LEFT >refer to  Oncology     . Hemorrhagic shock 05/27/2014  . Spontaneous hemorrhage     Rectus sheath hematoma, while on anticoag   Past Surgical History:  Past Surgical History  Procedure Laterality Date  . Tonsillectomy    . Eye surgery Right Aug. 2014    Cataract  . Eye surgery Left Oct. 2014    Cataract  . Abdominal hysterectomy  1984    Total  . Video bronchoscopy with endobronchial navigation N/A 04/20/2014    Procedure: VIDEO BRONCHOSCOPY WITH ENDOBRONCHIAL NAVIGATION;  Surgeon:  Collene Gobble, MD;  Location: [facility];  Service: Thoracic;  Laterality:  N/A;  . Video bronchoscopy with endobronchial ultrasound N/A 04/20/2014    Procedure: VIDEO BRONCHOSCOPY WITH ENDOBRONCHIAL ULTRASOUND;  Surgeon:  Collene Gobble, MD;  Location: [facility] OR;  Service: Thoracic;   Laterality:  N/A;   HPI:  Other Pertinent Information: 81 yo, never smoker, with known  adenocarcinoma of lung and mets, who  was taking her oral meds 5/31 and  choked on a pill. EMS was activated and she was treated with  bronchodilators and steroids. She was noted to be hypoxic and was placed  on heparin for presumed PE and Afib. Note in 11/15 she had rectus sheath  hematoma and all anticoagulation was discontinued at that time. Pt reports  intermittent difficulty at home swallowing her pills, although denies  dysphagia to food/drink.  No Data Recorded  Assessment / Plan / Recommendation CHL IP CLINICAL IMPRESSIONS 12/02/2014  Therapy Diagnosis Mild pharyngeal phase dysphagia   Clinical Impression Pt has a mild pharyngeal dysphaga with flash  penetration noted during the swallow with thin liquids. Pt has adequate  airway protection with all consistencies tested with no frank penetration  or aspiration observed. Recommend to continue Dys 3 diet and thin liquids  with general aspiration precautions and continued administration of pills  in puree, avoiding other mixed consistencies as well. SLP to f/u briefly  for education and tolerance.      CHL IP TREATMENT RECOMMENDATION 12/02/2014  Treatment Recommendations Therapy as outlined in treatment plan below     CHL IP DIET RECOMMENDATION 12/02/2014  SLP Diet Recommendations Dysphagia 3 (Mech soft);Thin  Liquid Administration via (None)  Medication Administration Whole meds with puree  Compensations Slow rate;Small sips/bites  Postural Changes and/or Swallow Maneuvers (None)     CHL IP OTHER RECOMMENDATIONS 12/02/2014  Recommended Consults (None)  Oral Care Recommendations Oral care BID  Other Recommendations (None)     No flowsheet data found.   CHL IP FREQUENCY AND DURATION 12/02/2014  Speech Therapy Frequency (ACUTE ONLY) min 1 x/week  Treatment Duration 1 week     Pertinent Vitals/Pain: n/a     SLP Swallow Goals     CHL IP REASON FOR REFERRAL 12/02/2014  Reason for Referral  Objectively evaluate swallowing function     CHL IP ORAL PHASE 12/02/2014  Oral Phase WFL      CHL IP PHARYNGEAL PHASE 12/02/2014  Pharyngeal Phase Impaired      CHL IP CERVICAL ESOPHAGEAL PHASE 12/02/2014  Cervical Esophageal Phase [facility]         Germain Osgood, M.A. CCC-SLP [phone]  Germain Osgood 12/02/2014, 12:34 PM     Microbiology: Recent Results (from the past 240 hour(s))  Culture, blood (routine x 2) Call MD if unable to obtain prior to antibiotics being given     Status: None   Collection Time: 11/30/14  4:20 AM  Result Value Ref Range Status   Specimen Description BLOOD RIGHT FOREARM  Final   Special Requests BOTTLES DRAWN AEROBIC AND ANAEROBIC 5CC EA  Final   Culture   Final    NO GROWTH 5 DAYS Performed at [facility]    Report Status 12/06/2014 FINAL  Final  Culture, blood (routine x 2) Call MD if unable to obtain prior to antibiotics being given     Status: None   Collection Time: 11/30/14  4:20 AM  Result Value Ref Range Status   Specimen Description BLOOD RIGHT FOREARM  Final   Special Requests BOTTLES DRAWN AEROBIC AND ANAEROBIC 5CC EA  Final   Culture   Final    NO GROWTH 5 DAYS Performed at [facility]    Report Status 12/06/2014 FINAL  Final  MRSA PCR Screening     Status: None   Collection Time: 11/30/14  9:50 AM  Result Value Ref Range Status   MRSA by PCR NEGATIVE NEGATIVE Final  Comment:        The GeneXpert MRSA Assay (FDA approved for NASAL specimens only), is one component of a comprehensive MRSA colonization surveillance program. It is not intended to diagnose MRSA infection nor to guide or monitor treatment for MRSA infections.   Urine culture     Status: None   Collection Time: 11/30/14 12:00 PM  Result Value Ref Range Status   Specimen Description URINE, RANDOM  Final   Special Requests NONE  Final   Colony Count   Final    >=100,000 COLONIES/ML Performed at [facility]    Culture   Final    KLEBSIELLA  PNEUMONIAE Performed at [facility]    Report Status 12/02/2014 FINAL  Final   Organism ID, Bacteria KLEBSIELLA PNEUMONIAE  Final      Susceptibility   Klebsiella pneumoniae - MIC*    AMPICILLIN >=32 RESISTANT Resistant     CEFAZOLIN <=4 SENSITIVE Sensitive     CEFTRIAXONE <=1 SENSITIVE Sensitive     CIPROFLOXACIN <=0.25 SENSITIVE Sensitive     GENTAMICIN <=1 SENSITIVE Sensitive     LEVOFLOXACIN <=0.12 SENSITIVE Sensitive     NITROFURANTOIN 32 SENSITIVE Sensitive     TOBRAMYCIN <=1 SENSITIVE Sensitive     TRIMETH/SULFA <=20 SENSITIVE Sensitive     PIP/TAZO 8 SENSITIVE Sensitive     * KLEBSIELLA PNEUMONIAE  Culture, sputum-assessment     Status: None   Collection Time: 12/04/14  5:24 PM  Result Value Ref Range Status   Specimen Description SPUTUM  Final   Special Requests NONE  Final   Sputum evaluation   Final    MICROSCOPIC FINDINGS SUGGEST THAT THIS SPECIMEN IS NOT REPRESENTATIVE OF LOWER RESPIRATORY SECRETIONS. PLEASE RECOLLECT. CALLED TO TIJANI,T RN 12/04/14 1820 Todd    Report Status 12/04/2014 FINAL  Final  Clostridium Difficile by PCR (not at [facility])     Status: Abnormal   Collection Time: 12/06/14  6:10 PM  Result Value Ref Range Status   C difficile by pcr POSITIVE (A) NEGATIVE Final    Comment: CRITICAL RESULT CALLED TO, READ BACK BY AND VERIFIED WITH: TEASLEY,L RN @ 1517 12/07/14 LEONARD,A      Labs: Basic Metabolic Panel:  Recent Labs Lab 12/04/14 0451 12/05/14 0252 12/06/14 0408 12/07/14 0359 12/08/14 0405 12/08/14 0914 12/09/14 0311  NA 136 137 135 138  --  138 141  K 5.0 4.8 4.5 3.9  --  5.3* 3.6  CL 98* 99* 94* 94*  --  95* 90*  CO2 '29 30 31 '$ 35*  --  32 41*  GLUCOSE 102* 84 74 82  --  85 89  BUN 65* 67* 69* 61*  --  58* 53*  CREATININE 1.77* 1.78* 1.71* 1.59*  --  1.55* 1.49*  CALCIUM 8.9 8.6* 8.5* 8.5*  --  8.2* 8.3*  MG 2.0 2.0  --  1.7 1.8  --  1.7  PHOS  --   --  4.4  --   --  3.6 3.4   Liver Function Tests:  Recent Labs Lab  12/03/14 0358 12/04/14 0451 12/05/14 0252 12/06/14 0408 12/07/14 0359 12/08/14 0914 12/09/14 0311  AST '24 25 22  '$ --  26  --   --   ALT '17 18 18  '$ --  20  --   --   ALKPHOS 122 118 118  --  119  --   --   BILITOT 0.9 1.1 1.1  --  1.2  --   --  PROT 5.5* 5.6* 6.1*  --  5.6*  --   --   ALBUMIN 2.8* 2.9* 2.8* 2.9* 2.6* 2.7* 2.5*   No results for input(s): LIPASE, AMYLASE in the last 168 hours. No results for input(s): AMMONIA in the last 168 hours. CBC:  Recent Labs Lab 12/03/14 0358 12/04/14 0451 12/05/14 0252 12/06/14 0408  WBC 6.0 4.1 4.2 3.7*  NEUTROABS 5.2 3.0 3.0  --   HGB 11.3* 11.5* 11.8* 11.8*  HCT 34.4* 35.0* 34.5* 36.1  MCV 103.3* 103.2* 102.4* 102.3*  PLT 125* 121* 123* 127*   Cardiac Enzymes: No results for input(s): CKTOTAL, CKMB, CKMBINDEX, TROPONINI in the last 168 hours. BNP: BNP (last 3 results)  Recent Labs  06/26/14 2353 11/30/14 0015  BNP 620.7* 477.8*    ProBNP (last 3 results) No results for input(s): PROBNP in the last 8760 hours.  CBG:  Recent Labs Lab 12/02/14 1149  GLUCAP 102*       Signed:  Danyla Wattley A  [facility] 12/09/2014, 10:33 AM

## 2014-12-09 NOTE — Progress Notes (Signed)
Pt had episode of nosebleed controlled with pressure to it for 5 minutes, appears to be caused by humidifier bottle went empty, applied a new bottle and no other incidents have happened, will continue to monitor, thanks Buckner Malta.

## 2014-12-13 ENCOUNTER — Other Ambulatory Visit: Payer: Self-pay | Admitting: Internal Medicine

## 2014-12-13 ENCOUNTER — Ambulatory Visit (HOSPITAL_COMMUNITY)
Admission: RE | Admit: 2014-12-13 | Discharge: 2014-12-13 | Disposition: A | Discharge status: Home / Self Care | Payer: Medicare Other | Source: Ambulatory Visit | Attending: Internal Medicine | Admitting: Internal Medicine

## 2014-12-13 ENCOUNTER — Encounter (HOSPITAL_COMMUNITY): Payer: Self-pay

## 2014-12-13 ENCOUNTER — Other Ambulatory Visit (HOSPITAL_BASED_OUTPATIENT_CLINIC_OR_DEPARTMENT_OTHER): Payer: Medicare Other

## 2014-12-13 DIAGNOSIS — Z9981 Dependence on supplemental oxygen: Secondary | ICD-10-CM | POA: Diagnosis not present

## 2014-12-13 DIAGNOSIS — R05 Cough: Secondary | ICD-10-CM | POA: Diagnosis not present

## 2014-12-13 DIAGNOSIS — C7951 Secondary malignant neoplasm of bone: Secondary | ICD-10-CM | POA: Insufficient documentation

## 2014-12-13 DIAGNOSIS — K8020 Calculus of gallbladder without cholecystitis without obstruction: Secondary | ICD-10-CM | POA: Insufficient documentation

## 2014-12-13 DIAGNOSIS — C3490 Malignant neoplasm of unspecified part of unspecified bronchus or lung: Secondary | ICD-10-CM | POA: Insufficient documentation

## 2014-12-13 DIAGNOSIS — K766 Portal hypertension: Secondary | ICD-10-CM | POA: Insufficient documentation

## 2014-12-13 DIAGNOSIS — R0602 Shortness of breath: Secondary | ICD-10-CM | POA: Diagnosis not present

## 2014-12-13 DIAGNOSIS — R188 Other ascites: Secondary | ICD-10-CM | POA: Insufficient documentation

## 2014-12-13 DIAGNOSIS — C3412 Malignant neoplasm of upper lobe, left bronchus or lung: Secondary | ICD-10-CM

## 2014-12-13 DIAGNOSIS — Z79899 Other long term (current) drug therapy: Secondary | ICD-10-CM | POA: Insufficient documentation

## 2014-12-13 DIAGNOSIS — K7460 Unspecified cirrhosis of liver: Secondary | ICD-10-CM | POA: Diagnosis not present

## 2014-12-13 DIAGNOSIS — C787 Secondary malignant neoplasm of liver and intrahepatic bile duct: Secondary | ICD-10-CM | POA: Diagnosis not present

## 2014-12-13 LAB — COMPREHENSIVE METABOLIC PANEL (CC13)
ALK PHOS: 157 U/L — AB (ref 40–150)
ALT: 30 U/L (ref 0–55)
AST: 39 U/L — AB (ref 5–34)
Albumin: 3.4 g/dL — ABNORMAL LOW (ref 3.5–5.0)
Anion Gap: 14 mEq/L — ABNORMAL HIGH (ref 3–11)
BILIRUBIN TOTAL: 1.23 mg/dL — AB (ref 0.20–1.20)
BUN: 50.4 mg/dL — AB (ref 7.0–26.0)
CO2: 40 mEq/L — ABNORMAL HIGH (ref 22–29)
CREATININE: 1.8 mg/dL — AB (ref 0.6–1.1)
Calcium: 9.7 mg/dL (ref 8.4–10.4)
Chloride: 90 mEq/L — ABNORMAL LOW (ref 98–109)
EGFR: 27 mL/min/{1.73_m2} — AB (ref >90)
GLUCOSE: 79 mg/dL (ref 70–140)
Potassium: 4.9 mEq/L (ref 3.5–5.1)
Sodium: 144 mEq/L (ref 136–145)
Total Protein: 6.8 g/dL (ref 6.4–8.3)

## 2014-12-13 LAB — CBC WITH DIFFERENTIAL/PLATELET
BASO%: 1 % (ref 0.0–2.0)
Basophils Absolute: 0.1 10*3/uL (ref 0.0–0.1)
EOS%: 2.1 % (ref 0.0–7.0)
Eosinophils Absolute: 0.1 10*3/uL (ref 0.0–0.5)
HCT: 38.9 % (ref 34.8–46.6)
HGB: 12.4 g/dL (ref 11.6–15.9)
LYMPH%: 24.1 % (ref 14.0–49.7)
MCH: 34.3 pg — ABNORMAL HIGH (ref 25.1–34.0)
MCHC: 31.9 g/dL (ref 31.5–36.0)
MCV: 107.8 fL — ABNORMAL HIGH (ref 79.5–101.0)
MONO#: 0.7 10*3/uL (ref 0.1–0.9)
MONO%: 14.8 % — ABNORMAL HIGH (ref 0.0–14.0)
NEUT#: 2.8 10*3/uL (ref 1.5–6.5)
NEUT%: 58 % (ref 38.4–76.8)
Platelets: 119 10*3/uL — ABNORMAL LOW (ref 145–400)
RBC: 3.61 10*6/uL — ABNORMAL LOW (ref 3.70–5.45)
RDW: 18.1 % — ABNORMAL HIGH (ref 11.2–14.5)
WBC: 4.9 10*3/uL (ref 3.9–10.3)
lymph#: 1.2 10*3/uL (ref 0.9–3.3)

## 2014-12-19 ENCOUNTER — Telehealth: Payer: Self-pay | Admitting: Internal Medicine

## 2014-12-19 ENCOUNTER — Ambulatory Visit (HOSPITAL_BASED_OUTPATIENT_CLINIC_OR_DEPARTMENT_OTHER): Payer: Medicare Other | Admitting: Internal Medicine

## 2014-12-19 ENCOUNTER — Encounter: Payer: Self-pay | Admitting: Internal Medicine

## 2014-12-19 VITALS — BP 101/60 | HR 93 | Temp 97.6°F | Resp 17 | Ht 66.0 in | Wt 178.5 lb

## 2014-12-19 DIAGNOSIS — C3412 Malignant neoplasm of upper lobe, left bronchus or lung: Secondary | ICD-10-CM | POA: Diagnosis not present

## 2014-12-19 DIAGNOSIS — R197 Diarrhea, unspecified: Secondary | ICD-10-CM

## 2014-12-19 DIAGNOSIS — K7689 Other specified diseases of liver: Secondary | ICD-10-CM | POA: Diagnosis not present

## 2014-12-19 DIAGNOSIS — R21 Rash and other nonspecific skin eruption: Secondary | ICD-10-CM | POA: Diagnosis not present

## 2014-12-19 DIAGNOSIS — C3490 Malignant neoplasm of unspecified part of unspecified bronchus or lung: Secondary | ICD-10-CM

## 2014-12-19 NOTE — Telephone Encounter (Signed)
Gave patient avs report and appointments for June and July.  °

## 2014-12-19 NOTE — Progress Notes (Signed)
Brittney Thomas [phone]   [phone]  OFFICE PROGRESS NOTE  Brittney Thomas [address] [place] [address]  DIAGNOSIS: Lung cancer, main bronchus  Staging form: Lung, AJCC 7th Edition  Clinical stage from 05/03/2014: Stage IV (T1a, N3, M1b) - [facility] One Molecular markers: positive for [id number], [id number], [id number]*  PRIOR THERAPY: systemic chemotherapy with carboplatin for an AUC of 5 and Alimta 500 mg/m given every 3 weeks. Status post one cycle, discontinued secondary to intolerance.  CURRENT THERAPY: Iressa 250 mg by mouth daily status post 6 months of treatment.  INTERVAL HISTORY: Brittney Thomas 81 y.o. female returns to the clinic today for follow-up visit accompanied by her daughter. The patient is feeling fine today with no specific complaints except for fatigue. She is still tolerating her treatment with Iressa fairly well. She continues to have mild skin rash and few episodes of diarrhea. She denied having any significant chest pain but has shortness of breath with exertion was no cough or hemoptysis. The patient denied having any significant fever or chills, no nausea or vomiting. The patient had repeat CT scan of the chest, abdomen and pelvis performed recently and she is here for evaluation and discussion of her scan results.  MEDICAL HISTORY: Past Medical History  Diagnosis Date  . Ulcer 06-08-13    Left medial ankle  . Hypertension   . Thyroid disease     Hypo-Thyroidism  . Esophageal reflux   . COPD (chronic obstructive pulmonary disease)   . Peripheral vascular disease   . Hypothyroidism   . Insomnia     takes lorazepam  . Arthritis   . Dysrhythmia   . DVT (deep venous thrombosis) 2008  . Paroxysmal a-fib 2008    Anticoag stopped due to rectus sheath bleed  . PE (pulmonary thromboembolism) 2008  . Wears dentures   . Moderate to severe pulmonary hypertension   . Acute and chronic  respiratory failure with hypoxia 06/14/2014    December 2015 2 L of oxygen continuously   . Lung cancer, main bronchus 05/03/2014    LUL, non small cell   . Adenocarcinoma, lung 04/15/2014    EBUS 04/20/14 >NON small cell carcinoma , adenocarcinoma ;LEFT >refer to Oncology     . Hemorrhagic shock 05/27/2014  . Spontaneous hemorrhage     Rectus sheath hematoma, while on anticoag    ALLERGIES:  is allergic to poison ivy extract; sulfa antibiotics; and sulfacetamide sodium.  MEDICATIONS:  Current Outpatient Prescriptions  Medication Sig Dispense Refill  . acetaminophen (TYLENOL) 500 MG tablet Take 500 mg by mouth every 6 (six) hours as needed (pain).    [name] apixaban (ELIQUIS) 2.5 MG TABS tablet Take 1 tablet (2.5 mg total) by mouth 2 (two) times daily. 60 tablet 0  . aspirin EC 81 MG tablet Take 1 tablet (81 mg total) by mouth daily.    . calcium-vitamin D (OSCAL WITH D) 500-200 MG-UNIT per tablet Take 1 tablet by mouth daily with breakfast.     . diltiazem (CARDIZEM CD) 120 MG 24 hr capsule Take 1 capsule (120 mg total) by mouth daily. 30 capsule 0  . folic acid (FOLVITE) 1 MG tablet Take 1 tablet (1 mg total) by mouth daily. 30 tablet 3  . furosemide (LASIX) 40 MG tablet Take 1 tablet (40 mg total) by mouth 2 (two) times daily. 60 tablet 0  . HYDROcodone-acetaminophen (NORCO/VICODIN) 5-325 MG per tablet Take 1 tablet by mouth  every 6 (six) hours as needed for moderate pain. 30 tablet 0  . ipratropium-albuterol (DUONEB) 0.5-2.5 (3) MG/3ML SOLN Take 3 mLs by nebulization every 6 (six) hours as needed. 360 mL   . IRESSA 250 MG tablet TAKE 1 TABLET BY MOUTH ONCE DAILY 30 tablet 2  . levothyroxine (SYNTHROID, LEVOTHROID) 88 MCG tablet Take 88 mcg by mouth daily before breakfast.    . metoprolol tartrate (LOPRESSOR) 25 MG tablet Take 0.5 tablets (12.5 mg total) by mouth 2 (two) times daily. 60 tablet 0  . metroNIDAZOLE (FLAGYL) 500 MG tablet Take 1 tablet (500 mg total) by mouth 3 (three) times  daily. 36 tablet 0  . Omega-3 Fatty Acids (SALMON OIL-1000 PO) Take 1 tablet by mouth daily.     . potassium chloride SA (K-DUR,KLOR-CON) 20 MEQ tablet Take 1 tablet (20 mEq total) by mouth daily. 30 tablet 0  . vitamin C (ASCORBIC ACID) 500 MG tablet Take 500 mg by mouth 2 (two) times daily.    . metoprolol tartrate (LOPRESSOR) 25 MG tablet Take 0.5 tablets (12.5 mg total) by mouth 2 (two) times daily. 60 tablet 0   No current facility-administered medications for this visit.    SURGICAL HISTORY:  Past Surgical History  Procedure Laterality Date  . Tonsillectomy    . Eye surgery Right Aug. 2014    Cataract  . Eye surgery Left Oct. 2014    Cataract  . Abdominal hysterectomy  1984    Total  . Video bronchoscopy with endobronchial navigation N/A 04/20/2014    Procedure: VIDEO BRONCHOSCOPY WITH ENDOBRONCHIAL NAVIGATION;  Surgeon: Collene Gobble, MD;  Location: [facility];  Service: Thoracic;  Laterality: N/A;  . Video bronchoscopy with endobronchial ultrasound N/A 04/20/2014    Procedure: VIDEO BRONCHOSCOPY WITH ENDOBRONCHIAL ULTRASOUND;  Surgeon: Collene Gobble, MD;  Location: [facility];  Service: Thoracic;  Laterality: N/A;    REVIEW OF SYSTEMS:  Constitutional: positive for fatigue Eyes: negative Ears, nose, mouth, throat, and face: negative Respiratory: positive for dyspnea on exertion Cardiovascular: negative Gastrointestinal: positive for diarrhea Genitourinary:negative Integument/breast: negative Hematologic/lymphatic: negative Musculoskeletal:negative Neurological: negative Behavioral/Psych: negative Endocrine: negative Allergic/Immunologic: negative   PHYSICAL EXAMINATION: General appearance: alert, cooperative, fatigued and no distress Head: Normocephalic, without obvious abnormality, atraumatic Neck: no adenopathy, no JVD, supple, symmetrical, trachea midline and thyroid not enlarged, symmetric, no tenderness/mass/nodules Lymph nodes: Cervical, supraclavicular, and axillary  nodes normal. Resp: clear to auscultation bilaterally Back: symmetric, no curvature. ROM normal. No CVA tenderness. Cardio: regular rate and rhythm, S1, S2 normal, no murmur, click, rub or gallop GI: soft, non-tender; bowel sounds normal; no masses,  no organomegaly Extremities: extremities normal, atraumatic, no cyanosis or edema Neurologic: Alert and oriented X 3, normal strength and tone. Normal symmetric reflexes. Normal coordination and gait  ECOG PERFORMANCE STATUS: 2 - Symptomatic, <50% confined to bed  Blood pressure 101/60, pulse 93, temperature 97.6 F (36.4 C), temperature source Oral, resp. rate 17, height 5' 6"  (1.676 m), weight 178 lb 8 oz (80.967 kg), SpO2 96 %, peak flow 2 L/min.  LABORATORY DATA: Lab Results  Component Value Date   WBC 4.9 12/13/2014   HGB 12.4 12/13/2014   HCT 38.9 12/13/2014   MCV 107.8* 12/13/2014   PLT 119* 12/13/2014      Chemistry      Component Value Date/Time   NA 144 12/13/2014 0902   NA 141 12/09/2014 0311   K 4.9 12/13/2014 0902   K 3.6 12/09/2014 0311   CL 90* 12/09/2014 0311  CO2 40* 12/13/2014 0902   CO2 41* 12/09/2014 0311   BUN 50.4* 12/13/2014 0902   BUN 53* 12/09/2014 0311   CREATININE 1.8* 12/13/2014 0902   CREATININE 1.49* 12/09/2014 0311      Component Value Date/Time   CALCIUM 9.7 12/13/2014 0902   CALCIUM 8.3* 12/09/2014 0311   ALKPHOS 157* 12/13/2014 0902   ALKPHOS 119 12/07/2014 0359   AST 39* 12/13/2014 0902   AST 26 12/07/2014 0359   ALT 30 12/13/2014 0902   ALT 20 12/07/2014 0359   BILITOT 1.23* 12/13/2014 0902   BILITOT 1.2 12/07/2014 0359       RADIOGRAPHIC STUDIES: Ct Abdomen Pelvis Wo Contrast  12/13/2014   CLINICAL DATA:  Lung cancer. Chemotherapy ongoing. Cough. Shortness of breath. On home oxygen.  EXAM: CT CHEST, ABDOMEN AND PELVIS WITHOUT CONTRAST  TECHNIQUE: Multidetector CT imaging of the chest, abdomen and pelvis was performed following the standard protocol without IV contrast.   COMPARISON:  Chest radiograph of 12/04/2014. Prior CTs of 09/01/2014.  FINDINGS: CT CHEST FINDINGS  Mediastinum/Nodes: Similar left-sided low cervical soft tissue fullness on image 5 is again favored to arise from the thyroid. Tortuous thoracic aorta. Moderate cardiomegaly with left circumflex coronary artery atherosclerosis. Pulmonary artery enlargement. No pericardial effusion. No mediastinal or definite hilar adenopathy, given limitations of unenhanced CT. Contrast level in the thoracic esophagus, including on image 31.  Lungs/Pleura: Trace right pleural fluid is similar. Secretions in the non dependent trachea. Dependent opacity in the right middle lobe is likely subsegmental atelectasis on image 38.  Mild motion degradation throughout. Scarring or atelectasis at the right lung base. Further decrease in size and masslike appearance of the left apical pulmonary nodule. 0.9 x 1.6 cm today on image 12 versus 0.9 x 1.8 cm on the prior.  Musculoskeletal: Left scapular lytic lesion is similar at 1.5 cm. S-shaped thoracolumbar spine curvature  CT ABDOMEN PELVIS FINDINGS  Hepatobiliary: Cirrhosis. Mild hepatic steatosis. Combination limits evaluation for liver lesions. Vague hypoattenuation in the high right lobe of the liver measures on the order of 1.2 cm on image 48 and is similar.  Subcapsular anterior segment right liver lobe cyst is similar at 1.6 cm on image 56. Left hepatic lobe complex cyst again identified. Cholelithiasis without biliary duct dilatation.  Pancreas: Normal, without mass or ductal dilatation.  Spleen: Normal  Adrenals/Urinary Tract: Similar mild left adrenal nodularity. Normal right adrenal gland. Normal kidneys, without hydronephrosis. Suspicion of mild bladder wall thickening and irregularity with surrounding edema. Example image 107.  Stomach/Bowel: Normal stomach, without wall thickening. Mild motion degradation continuing into the abdomen and pelvis. Normal colon and terminal ileum. Normal  caliber small bowel.  Vascular/Lymphatic: Advanced aortic and branch vessel atherosclerosis. IVC filter. Similar retroperitoneal nodes up to 9 mm. Not pathologic by size criteria. No pelvic sidewall adenopathy. Soft tissue densities throughout the pelvis are similar and favored to represent portosystemic collaterals/varices.  Reproductive: Hysterectomy.  Other: Slight decrease in small volume abdominal pelvic ascites. Decrease in left extraperitoneal pelvic fluid collection. 1.9 x 4.8 cm today versus 6.4 x 10.3 cm on the prior. Anasarca.  Musculoskeletal: Similar right iliac wing sclerosis. Right-sided L4 sclerotic lesion is similar.  IMPRESSION: CT CHEST IMPRESSION  1. Motion degradation throughout. Exam also degraded by lack of IV contrast. 2. Slight decrease in size and masslike appearance of left upper lobe pulmonary nodule. 3. No thoracic adenopathy. 4. Cardiomegaly. Atherosclerosis, including within the coronary arteries. Pulmonary artery enlargement suggests pulmonary arterial hypertension. 5. Similar trace right pleural  fluid. 6. Esophageal air fluid level suggests dysmotility or gastroesophageal reflux.  CT ABDOMEN AND PELVIS IMPRESSION  1. Degraded evaluation of the liver, as detailed on prior exam. Grossly normal hepatic metastasis. 2. No convincing evidence of new metastatic disease within the abdomen or pelvis. 3. Similar osseous metastasis. 4. Decreased abdominal pelvic ascites and left extraperitoneal hematoma. 5. Cirrhosis with portal venous hypertension, including probable portosystemic collaterals/varices within the pelvis. 6. Mild bladder wall thickening and surrounding edema. Nonspecific, especially in the setting of portal venous hypertension. Correlate with urinary symptoms and possibly urinalysis. 7. Cholelithiasis.   Electronically Signed   By: Abigail Miyamoto M.D.   On: 12/13/2014 11:28   Ct Chest Wo Contrast  12/13/2014   CLINICAL DATA:  Lung cancer. Chemotherapy ongoing. Cough. Shortness  of breath. On home oxygen.  EXAM: CT CHEST, ABDOMEN AND PELVIS WITHOUT CONTRAST  TECHNIQUE: Multidetector CT imaging of the chest, abdomen and pelvis was performed following the standard protocol without IV contrast.  COMPARISON:  Chest radiograph of 12/04/2014. Prior CTs of 09/01/2014.  FINDINGS: CT CHEST FINDINGS  Mediastinum/Nodes: Similar left-sided low cervical soft tissue fullness on image 5 is again favored to arise from the thyroid. Tortuous thoracic aorta. Moderate cardiomegaly with left circumflex coronary artery atherosclerosis. Pulmonary artery enlargement. No pericardial effusion. No mediastinal or definite hilar adenopathy, given limitations of unenhanced CT. Contrast level in the thoracic esophagus, including on image 31.  Lungs/Pleura: Trace right pleural fluid is similar. Secretions in the non dependent trachea. Dependent opacity in the right middle lobe is likely subsegmental atelectasis on image 38.  Mild motion degradation throughout. Scarring or atelectasis at the right lung base. Further decrease in size and masslike appearance of the left apical pulmonary nodule. 0.9 x 1.6 cm today on image 12 versus 0.9 x 1.8 cm on the prior.  Musculoskeletal: Left scapular lytic lesion is similar at 1.5 cm. S-shaped thoracolumbar spine curvature  CT ABDOMEN PELVIS FINDINGS  Hepatobiliary: Cirrhosis. Mild hepatic steatosis. Combination limits evaluation for liver lesions. Vague hypoattenuation in the high right lobe of the liver measures on the order of 1.2 cm on image 48 and is similar.  Subcapsular anterior segment right liver lobe cyst is similar at 1.6 cm on image 56. Left hepatic lobe complex cyst again identified. Cholelithiasis without biliary duct dilatation.  Pancreas: Normal, without mass or ductal dilatation.  Spleen: Normal  Adrenals/Urinary Tract: Similar mild left adrenal nodularity. Normal right adrenal gland. Normal kidneys, without hydronephrosis. Suspicion of mild bladder wall thickening and  irregularity with surrounding edema. Example image 107.  Stomach/Bowel: Normal stomach, without wall thickening. Mild motion degradation continuing into the abdomen and pelvis. Normal colon and terminal ileum. Normal caliber small bowel.  Vascular/Lymphatic: Advanced aortic and branch vessel atherosclerosis. IVC filter. Similar retroperitoneal nodes up to 9 mm. Not pathologic by size criteria. No pelvic sidewall adenopathy. Soft tissue densities throughout the pelvis are similar and favored to represent portosystemic collaterals/varices.  Reproductive: Hysterectomy.  Other: Slight decrease in small volume abdominal pelvic ascites. Decrease in left extraperitoneal pelvic fluid collection. 1.9 x 4.8 cm today versus 6.4 x 10.3 cm on the prior. Anasarca.  Musculoskeletal: Similar right iliac wing sclerosis. Right-sided L4 sclerotic lesion is similar.  IMPRESSION: CT CHEST IMPRESSION  1. Motion degradation throughout. Exam also degraded by lack of IV contrast. 2. Slight decrease in size and masslike appearance of left upper lobe pulmonary nodule. 3. No thoracic adenopathy. 4. Cardiomegaly. Atherosclerosis, including within the coronary arteries. Pulmonary artery enlargement suggests pulmonary arterial hypertension.  5. Similar trace right pleural fluid. 6. Esophageal air fluid level suggests dysmotility or gastroesophageal reflux.  CT ABDOMEN AND PELVIS IMPRESSION  1. Degraded evaluation of the liver, as detailed on prior exam. Grossly normal hepatic metastasis. 2. No convincing evidence of new metastatic disease within the abdomen or pelvis. 3. Similar osseous metastasis. 4. Decreased abdominal pelvic ascites and left extraperitoneal hematoma. 5. Cirrhosis with portal venous hypertension, including probable portosystemic collaterals/varices within the pelvis. 6. Mild bladder wall thickening and surrounding edema. Nonspecific, especially in the setting of portal venous hypertension. Correlate with urinary symptoms and  possibly urinalysis. 7. Cholelithiasis.   Electronically Signed   By: Abigail Miyamoto M.D.   On: 12/13/2014 11:28   US Renal Port  11/30/2014   CLINICAL DATA:  Acute kidney injury. History of hypertension, lung cancer.  EXAM: RENAL / URINARY TRACT ULTRASOUND COMPLETE  COMPARISON:  CT of the chest, abdomen and pelvis August 31, 2004  FINDINGS: Right Kidney:  Length: 10.3 cm. Increased cortical echogenicity. No mass or hydronephrosis visualized.  Left Kidney:  Length: 10 cm. Increased cortical echogenicity. No mass or hydronephrosis visualized.  Bladder:  Appears normal for degree of bladder distention. Small amount of ascites seen in the abdomen.  IMPRESSION: Echogenic kidneys can be seen with medical renal disease without obstructive uropathy.  At least mild amount of ascites.   Electronically Signed   By: Elon Alas M.D.   On: 11/30/2014 06:02   Dg Chest Port 1 View  12/04/2014   CLINICAL DATA:  Left lung adenocarcinoma.  EXAM: PORTABLE CHEST - 1 VIEW  COMPARISON:  11/30/2014  FINDINGS: Left upper lobe pulmonary nodule seen on CT is not well visualized radiographically. Cardiomegaly is stable. Low lung volumes are again noted, however there is no evidence of focal consolidation or definite pleural effusion. Mild bibasilar atelectasis noted.  IMPRESSION: Low lung volumes with mild bibasilar atelectasis.  Stable cardiomegaly.   Electronically Signed   By: Earle Gell M.D.   On: 12/04/2014 09:21   Dg Chest Port 1 View  11/30/2014   CLINICAL DATA:  Shortness of breath after choking on medication tonight. History of lung cancer.  EXAM: PORTABLE CHEST - 1 VIEW  COMPARISON:  Radiographs 09/04/2014.  CT 09/01/2014  FINDINGS: Cardiomegaly is unchanged. Decreased vascular congestion from prior. The left upper lobe pulmonary nodule on prior CT is not seen radiographically. There is no consolidation, large pleural effusion or pneumothorax. No evident pneumomediastinum. Osseous structures are stable.  IMPRESSION: 1.  Decreased vascular congestion.  Stable cardiomegaly. 2. No acute process.   Electronically Signed   By: Jeb Levering M.D.   On: 11/30/2014 01:18   Dg Swallowing Func-speech Pathology  12/02/2014    Objective Swallowing Evaluation:    Patient Details  Name: Brittney Thomas MRN: [id number] Date of Birth: 05-29-1933  Today's Date: 12/02/2014 Time: SLP Start Time (ACUTE ONLY): 1208-SLP Stop Time (ACUTE ONLY): 1222 SLP Time Calculation (min) (ACUTE ONLY): 14 min  Past Medical History:  Past Medical History  Diagnosis Date  . Ulcer 06-08-13    Left medial ankle  . Hypertension   . Thyroid disease     Hypo-Thyroidism  . Esophageal reflux   . COPD (chronic obstructive pulmonary disease)   . Peripheral vascular disease   . Hypothyroidism   . Insomnia     takes lorazepam  . Arthritis   . Dysrhythmia   . DVT (deep venous thrombosis) 2008  . Paroxysmal a-fib 2008    Anticoag stopped due to  rectus sheath bleed  . PE (pulmonary thromboembolism) 2008  . Wears dentures   . Moderate to severe pulmonary hypertension   . Acute and chronic respiratory failure with hypoxia 06/14/2014    December 2015 2 L of oxygen continuously   . Lung cancer, main bronchus 05/03/2014    LUL, non small cell   . Adenocarcinoma, lung 04/15/2014    EBUS 04/20/14 >NON small cell carcinoma , adenocarcinoma ;LEFT >refer to  Oncology     . Hemorrhagic shock 05/27/2014  . Spontaneous hemorrhage     Rectus sheath hematoma, while on anticoag   Past Surgical History:  Past Surgical History  Procedure Laterality Date  . Tonsillectomy    . Eye surgery Right Aug. 2014    Cataract  . Eye surgery Left Oct. 2014    Cataract  . Abdominal hysterectomy  1984    Total  . Video bronchoscopy with endobronchial navigation N/A 04/20/2014    Procedure: VIDEO BRONCHOSCOPY WITH ENDOBRONCHIAL NAVIGATION;  Surgeon:  Collene Gobble, MD;  Location: [facility];  Service: Thoracic;  Laterality:  N/A;  . Video bronchoscopy with endobronchial ultrasound N/A 04/20/2014    Procedure: VIDEO  BRONCHOSCOPY WITH ENDOBRONCHIAL ULTRASOUND;  Surgeon:  Collene Gobble, MD;  Location: [facility];  Service: Thoracic;  Laterality:  N/A;   HPI:  Other Pertinent Information: 81 yo, never smoker, with known  adenocarcinoma of lung and mets, who was taking her oral meds 5/31 and  choked on a pill. EMS was activated and she was treated with  bronchodilators and steroids. She was noted to be hypoxic and was placed  on heparin for presumed PE and Afib. Note in 11/15 she had rectus sheath  hematoma and all anticoagulation was discontinued at that time. Pt reports  intermittent difficulty at home swallowing her pills, although denies  dysphagia to food/drink.  No Data Recorded  Assessment / Plan / Recommendation CHL IP CLINICAL IMPRESSIONS 12/02/2014  Therapy Diagnosis Mild pharyngeal phase dysphagia   Clinical Impression Pt has a mild pharyngeal dysphaga with flash  penetration noted during the swallow with thin liquids. Pt has adequate  airway protection with all consistencies tested with no frank penetration  or aspiration observed. Recommend to continue Dys 3 diet and thin liquids  with general aspiration precautions and continued administration of pills  in puree, avoiding other mixed consistencies as well. SLP to f/u briefly  for education and tolerance.      CHL IP TREATMENT RECOMMENDATION 12/02/2014  Treatment Recommendations Therapy as outlined in treatment plan below     CHL IP DIET RECOMMENDATION 12/02/2014  SLP Diet Recommendations Dysphagia 3 (Mech soft);Thin  Liquid Administration via (None)  Medication Administration Whole meds with puree  Compensations Slow rate;Small sips/bites  Postural Changes and/or Swallow Maneuvers (None)     CHL IP OTHER RECOMMENDATIONS 12/02/2014  Recommended Consults (None)  Oral Care Recommendations Oral care BID  Other Recommendations (None)     No flowsheet data found.   CHL IP FREQUENCY AND DURATION 12/02/2014  Speech Therapy Frequency (ACUTE ONLY) min 1 x/week  Treatment Duration 1 week      Pertinent Vitals/Pain: n/a     SLP Swallow Goals     CHL IP REASON FOR REFERRAL 12/02/2014  Reason for Referral Objectively evaluate swallowing function     CHL IP ORAL PHASE 12/02/2014  Oral Phase WFL      CHL IP PHARYNGEAL PHASE 12/02/2014  Pharyngeal Phase Impaired      CHL IP CERVICAL ESOPHAGEAL  PHASE 12/02/2014  Cervical Esophageal Phase [facility]         Germain Osgood, M.A. CCC-SLP [phone]  Germain Osgood 12/02/2014, 12:34 PM     ASSESSMENT AND PLAN: This is a very pleasant 81 years old white female with a stage IV non-small cell lung cancer, adenocarcinoma with positive EGFR mutation currently undergoing treatment with Iressa 250 mg by mouth daily for the last 6 months and tolerating her treatment fairly well.  The recent CT scan of the chest, abdomen and pelvis showed no evidence for disease progression. I discussed the scan results with the patient and her daughter.  I recommended for the patient to continue her current treatment with Iressa as scheduled but will hold her treatment for 1 week because of the elevated liver enzyme and renal insufficiency. I will repeat her comprehensive metabolic panel in one week for evaluation of her condition before resuming treatment with Iressa. I would see her back for follow-up visit in one month for reevaluation with repeat blood work For diarrhea, the patient will continue on Imodium on as-needed basis. The patient was advised to call immediately if she has any concerning symptoms in the interval. The patient voices understanding of current disease status and treatment options and is in agreement with the current care plan.  All questions were answered. The patient knows to call the clinic with any problems, questions or concerns. We can certainly see the patient much sooner if necessary.  Disclaimer: This note was dictated with voice recognition software. Similar sounding words can inadvertently be transcribed and may not be corrected upon review.

## 2014-12-26 ENCOUNTER — Other Ambulatory Visit: Payer: Medicare Other

## 2014-12-27 ENCOUNTER — Telehealth: Payer: Self-pay | Admitting: *Deleted

## 2014-12-27 ENCOUNTER — Other Ambulatory Visit (HOSPITAL_BASED_OUTPATIENT_CLINIC_OR_DEPARTMENT_OTHER): Payer: Medicare Other

## 2014-12-27 DIAGNOSIS — C3490 Malignant neoplasm of unspecified part of unspecified bronchus or lung: Secondary | ICD-10-CM

## 2014-12-27 DIAGNOSIS — R799 Abnormal finding of blood chemistry, unspecified: Secondary | ICD-10-CM

## 2014-12-27 DIAGNOSIS — K7689 Other specified diseases of liver: Secondary | ICD-10-CM

## 2014-12-27 DIAGNOSIS — C3412 Malignant neoplasm of upper lobe, left bronchus or lung: Secondary | ICD-10-CM | POA: Diagnosis not present

## 2014-12-27 LAB — COMPREHENSIVE METABOLIC PANEL (CC13)
ALT: 17 U/L (ref 0–55)
AST: 34 U/L (ref 5–34)
Albumin: 3.4 g/dL — ABNORMAL LOW (ref 3.5–5.0)
Alkaline Phosphatase: 145 U/L (ref 40–150)
Anion Gap: 12 mEq/L — ABNORMAL HIGH (ref 3–11)
BUN: 51.5 mg/dL — AB (ref 7.0–26.0)
CHLORIDE: 88 meq/L — AB (ref 98–109)
Calcium: 9.6 mg/dL (ref 8.4–10.4)
Creatinine: 1.8 mg/dL — ABNORMAL HIGH (ref 0.6–1.1)
EGFR: 26 mL/min/{1.73_m2} — ABNORMAL LOW (ref >90)
Glucose: 118 mg/dl (ref 70–140)
POTASSIUM: 4.2 meq/L (ref 3.5–5.1)
Sodium: 140 mEq/L (ref 136–145)
Total Bilirubin: 1.16 mg/dL (ref 0.20–1.20)
Total Protein: 6.6 g/dL (ref 6.4–8.3)

## 2014-12-27 NOTE — Addendum Note (Signed)
Addended by: Ardeen Garland on: 12/27/2014 04:25 PM   Modules accepted: Orders

## 2014-12-27 NOTE — Telephone Encounter (Addendum)
I spoke to [name] and sent Onc Tx request for lab appt next week.

## 2014-12-27 NOTE — Telephone Encounter (Signed)
BUN and Creatinine are still elevated. Continue to hold Iressa. Repeat B Met in one week.

## 2014-12-27 NOTE — Telephone Encounter (Signed)
Patient's daughter called "requesting  Today's lab results.  Are bun, creatinine pleasing to Dr, Julien Nordmann?  Is she is to resume Iressa?"  Will notify provider of this call.  Return number [phone].

## 2014-12-30 ENCOUNTER — Ambulatory Visit (INDEPENDENT_AMBULATORY_CARE_PROVIDER_SITE_OTHER): Payer: Medicare Other | Admitting: Cardiology

## 2014-12-30 VITALS — BP 102/58 | HR 126 | Ht 67.0 in | Wt 164.0 lb

## 2014-12-30 DIAGNOSIS — I5032 Chronic diastolic (congestive) heart failure: Secondary | ICD-10-CM

## 2014-12-30 DIAGNOSIS — Z86711 Personal history of pulmonary embolism: Secondary | ICD-10-CM | POA: Diagnosis not present

## 2014-12-30 DIAGNOSIS — I4891 Unspecified atrial fibrillation: Secondary | ICD-10-CM

## 2014-12-30 DIAGNOSIS — I272 Pulmonary hypertension, unspecified: Secondary | ICD-10-CM

## 2014-12-30 DIAGNOSIS — I270 Primary pulmonary hypertension: Secondary | ICD-10-CM

## 2014-12-30 NOTE — Progress Notes (Signed)
Clinical Summary Brittney Thomas is a 81 y.o.female seen today as a new patient for the following medical problems.  1. Chronic diastolic heart failure with RV dysfunction w/ pulmonary HTN - echo 11/2014 LVEF 60-65%, severe LAE, RV severely dilated with mildly reduced function, severe TR, PASP 31 - from echo 05/2014 PASP 65   2. Lung CA - stage IV metastatic nonsmall cell CA, currently undergoing chemo. - 08/2014 MRI brain no lesions   3. History of PE/DVT - IVC filter placed 4/16, anticoagulation was stopped due to rectus sheath hematoma - ?if restarted eliquis  4. Afib  Past Medical History  Diagnosis Date  . Ulcer 06-08-13    Left medial ankle  . Hypertension   . Thyroid disease     Hypo-Thyroidism  . Esophageal reflux   . COPD (chronic obstructive pulmonary disease)   . Peripheral vascular disease   . Hypothyroidism   . Insomnia     takes lorazepam  . Arthritis   . Dysrhythmia   . DVT (deep venous thrombosis) 2008  . Paroxysmal a-fib 2008    Anticoag stopped due to rectus sheath bleed  . PE (pulmonary thromboembolism) 2008  . Wears dentures   . Moderate to severe pulmonary hypertension   . Acute and chronic respiratory failure with hypoxia 06/14/2014    December 2015 2 L of oxygen continuously   . Lung cancer, main bronchus 05/03/2014    LUL, non small cell   . Adenocarcinoma, lung 04/15/2014    EBUS 04/20/14 >NON small cell carcinoma , adenocarcinoma ;LEFT >refer to Oncology     . Hemorrhagic shock 05/27/2014  . Spontaneous hemorrhage     Rectus sheath hematoma, while on anticoag     Allergies  Allergen Reactions  . Poison Ivy Extract [Extract Of Poison Ivy] Rash  . Sulfa Antibiotics Rash  . Sulfacetamide Sodium Rash     Current Outpatient Prescriptions  Medication Sig Dispense Refill  . acetaminophen (TYLENOL) 500 MG tablet Take 500 mg by mouth every 6 (six) hours as needed (pain).    [name] apixaban (ELIQUIS) 2.5 MG TABS tablet Take 1 tablet (2.5 mg  total) by mouth 2 (two) times daily. 60 tablet 0  . aspirin EC 81 MG tablet Take 1 tablet (81 mg total) by mouth daily.    . calcium-vitamin D (OSCAL WITH D) 500-200 MG-UNIT per tablet Take 1 tablet by mouth daily with breakfast.     . diltiazem (CARDIZEM CD) 120 MG 24 hr capsule Take 1 capsule (120 mg total) by mouth daily. 30 capsule 0  . folic acid (FOLVITE) 1 MG tablet Take 1 tablet (1 mg total) by mouth daily. 30 tablet 3  . furosemide (LASIX) 40 MG tablet Take 1 tablet (40 mg total) by mouth 2 (two) times daily. 60 tablet 0  . HYDROcodone-acetaminophen (NORCO/VICODIN) 5-325 MG per tablet Take 1 tablet by mouth every 6 (six) hours as needed for moderate pain. 30 tablet 0  . ipratropium-albuterol (DUONEB) 0.5-2.5 (3) MG/3ML SOLN Take 3 mLs by nebulization every 6 (six) hours as needed. 360 mL   . IRESSA 250 MG tablet TAKE 1 TABLET BY MOUTH ONCE DAILY 30 tablet 2  . levothyroxine (SYNTHROID, LEVOTHROID) 88 MCG tablet Take 88 mcg by mouth daily before breakfast.    . metoprolol tartrate (LOPRESSOR) 25 MG tablet Take 0.5 tablets (12.5 mg total) by mouth 2 (two) times daily. 60 tablet 0  . metoprolol tartrate (LOPRESSOR) 25 MG tablet Take 0.5 tablets (12.5 mg  total) by mouth 2 (two) times daily. 60 tablet 0  . metroNIDAZOLE (FLAGYL) 500 MG tablet Take 1 tablet (500 mg total) by mouth 3 (three) times daily. 36 tablet 0  . Omega-3 Fatty Acids (SALMON OIL-1000 PO) Take 1 tablet by mouth daily.     . potassium chloride SA (K-DUR,KLOR-CON) 20 MEQ tablet Take 1 tablet (20 mEq total) by mouth daily. 30 tablet 0  . vitamin C (ASCORBIC ACID) 500 MG tablet Take 500 mg by mouth 2 (two) times daily.     No current facility-administered medications for this visit.     Past Surgical History  Procedure Laterality Date  . Tonsillectomy    . Eye surgery Right Aug. 2014    Cataract  . Eye surgery Left Oct. 2014    Cataract  . Abdominal hysterectomy  1984    Total  . Video bronchoscopy with endobronchial  navigation N/A 04/20/2014    Procedure: VIDEO BRONCHOSCOPY WITH ENDOBRONCHIAL NAVIGATION;  Surgeon: Collene Gobble, MD;  Location: [facility];  Service: Thoracic;  Laterality: N/A;  . Video bronchoscopy with endobronchial ultrasound N/A 04/20/2014    Procedure: VIDEO BRONCHOSCOPY WITH ENDOBRONCHIAL ULTRASOUND;  Surgeon: Collene Gobble, MD;  Location: [facility];  Service: Thoracic;  Laterality: N/A;     Allergies  Allergen Reactions  . Poison Ivy Extract [Extract Of Poison Ivy] Rash  . Sulfa Antibiotics Rash  . Sulfacetamide Sodium Rash      Family History  Problem Relation Age of Onset  . Heart disease Maternal Grandmother   . Cancer Brother     kidney  . Stroke Brother      Social History [name] reports that she has never smoked. She has never used smokeless tobacco. [name] reports that she does not drink alcohol.   Review of Systems CONSTITUTIONAL: No weight loss, fever, chills, weakness or fatigue.  HEENT: Eyes: No visual loss, blurred vision, double vision or yellow sclerae.No hearing loss, sneezing, congestion, runny nose or sore throat.  SKIN: No rash or itching.  CARDIOVASCULAR:  RESPIRATORY: No shortness of breath, cough or sputum.  GASTROINTESTINAL: No anorexia, nausea, vomiting or diarrhea. No abdominal pain or blood.  GENITOURINARY: No burning on urination, no polyuria NEUROLOGICAL: No headache, dizziness, syncope, paralysis, ataxia, numbness or tingling in the extremities. No change in bowel or bladder control.  MUSCULOSKELETAL: No muscle, back pain, joint pain or stiffness.  LYMPHATICS: No enlarged nodes. No history of splenectomy.  PSYCHIATRIC: No history of depression or anxiety.  ENDOCRINOLOGIC: No reports of sweating, cold or heat intolerance. No polyuria or polydipsia.  [name]   Physical Examination There were no vitals filed for this visit. There were no vitals filed for this visit.  Gen: resting comfortably, no acute distress HEENT: no scleral icterus,  pupils equal round and reactive, no palptable cervical adenopathy,  CV Resp: Clear to auscultation bilaterally GI: abdomen is soft, non-tender, non-distended, normal bowel sounds, no hepatosplenomegaly MSK: extremities are warm, no edema.  Skin: warm, no rash Neuro:  no focal deficits Psych: appropriate affect   Diagnostic Studies 11/2014 echo Study Conclusions  - Left ventricle: The cavity size was normal. Wall thickness was normal. Systolic function was normal. The estimated ejection fraction was in the range of 60% to 65%. Wall motion was normal; there were no regional wall motion abnormalities. - Aortic valve: Mildly calcified annulus. Mildly thickened, mildly calcified leaflets. There was mild regurgitation. - Mitral valve: There was mild regurgitation. - Left atrium: The atrium was severely dilated. -  Right ventricle: The cavity size was severely dilated. Systolic function was mildly reduced. - Right atrium: The atrium was severely dilated. - Tricuspid valve: There was severe regurgitation. - Pulmonic valve: There was moderate regurgitation. - Pulmonary arteries: PA peak pressure: 31 mm Hg (S). - Pericardium, extracardiac: A small pericardial effusion was identified circumferential to the heart. There was no evidence of hemodynamic compromise.    Assessment and Plan        Arnoldo Lenis, M.D., F.A.C.C.

## 2014-12-30 NOTE — Patient Instructions (Signed)
Your physician recommends that you schedule a follow-up appointment in: 2-3 weeks with dr branch   STOP aspirin   Call me with medication name and dose, Cathey RN [phone]      Thank you for choosing [facility] !

## 2014-12-30 NOTE — Progress Notes (Signed)
Clinical Summary Brittney Thomas is a 81 y.o.female seen today as a new patient for the following medical problems.  1. Chronic diastolic heart failure with RV dysfunction w/ pulmonary HTN - echo 11/2014 LVEF 60-65%, severe LAE, RV severely dilated with mildly reduced function, severe TR, PASP 31 - from echo 05/2014 PASP 65 - stable  breathing, on home O2 daily 2 liters - continues to have some LE edema but somewhat improved on diuretics.    2. Lung CA - stage IV metastatic nonsmall cell CA, currently undergoing chemo. - 08/2014 MRI brain no lesions  3. History of PE/DVT - IVC filter placed 4/16, anticoagulation was stopped and eventually restarted due to rectus sheath hematoma  4. Afib - denies any palpitations - digoxin stopped Nov 2015, rate controlled with lopressor and dilt    Past Medical History  Diagnosis Date  . Ulcer 06-08-13    Left medial ankle  . Hypertension   . Thyroid disease     Hypo-Thyroidism  . Esophageal reflux   . COPD (chronic obstructive pulmonary disease)   . Peripheral vascular disease   . Hypothyroidism   . Insomnia     takes lorazepam  . Arthritis   . Dysrhythmia   . DVT (deep venous thrombosis) 2008  . Paroxysmal a-fib 2008    Anticoag stopped due to rectus sheath bleed  . PE (pulmonary thromboembolism) 2008  . Wears dentures   . Moderate to severe pulmonary hypertension   . Acute and chronic respiratory failure with hypoxia 06/14/2014    December 2015 2 L of oxygen continuously   . Lung cancer, main bronchus 05/03/2014    LUL, non small cell   . Adenocarcinoma, lung 04/15/2014    EBUS 04/20/14 >NON small cell carcinoma , adenocarcinoma ;LEFT >refer to Oncology     . Hemorrhagic shock 05/27/2014  . Spontaneous hemorrhage     Rectus sheath hematoma, while on anticoag     Allergies  Allergen Reactions  . Poison Ivy Extract [Extract Of Poison Ivy] Rash  . Sulfa Antibiotics Rash  . Sulfacetamide Sodium Rash     Current Outpatient  Prescriptions  Medication Sig Dispense Refill  . acetaminophen (TYLENOL) 500 MG tablet Take 500 mg by mouth every 6 (six) hours as needed (pain).    [name] apixaban (ELIQUIS) 2.5 MG TABS tablet Take 1 tablet (2.5 mg total) by mouth 2 (two) times daily. 60 tablet 0  . aspirin EC 81 MG tablet Take 1 tablet (81 mg total) by mouth daily.    . calcium-vitamin D (OSCAL WITH D) 500-200 MG-UNIT per tablet Take 1 tablet by mouth daily with breakfast.     . diltiazem (CARDIZEM CD) 120 MG 24 hr capsule Take 1 capsule (120 mg total) by mouth daily. 30 capsule 0  . folic acid (FOLVITE) 1 MG tablet Take 1 tablet (1 mg total) by mouth daily. 30 tablet 3  . furosemide (LASIX) 40 MG tablet Take 1 tablet (40 mg total) by mouth 2 (two) times daily. 60 tablet 0  . HYDROcodone-acetaminophen (NORCO/VICODIN) 5-325 MG per tablet Take 1 tablet by mouth every 6 (six) hours as needed for moderate pain. 30 tablet 0  . ipratropium-albuterol (DUONEB) 0.5-2.5 (3) MG/3ML SOLN Take 3 mLs by nebulization every 6 (six) hours as needed. 360 mL   . IRESSA 250 MG tablet TAKE 1 TABLET BY MOUTH ONCE DAILY 30 tablet 2  . levothyroxine (SYNTHROID, LEVOTHROID) 88 MCG tablet Take 88 mcg by mouth daily before breakfast.    .  metoprolol tartrate (LOPRESSOR) 25 MG tablet Take 0.5 tablets (12.5 mg total) by mouth 2 (two) times daily. 60 tablet 0  . metoprolol tartrate (LOPRESSOR) 25 MG tablet Take 0.5 tablets (12.5 mg total) by mouth 2 (two) times daily. 60 tablet 0  . metroNIDAZOLE (FLAGYL) 500 MG tablet Take 1 tablet (500 mg total) by mouth 3 (three) times daily. 36 tablet 0  . Omega-3 Fatty Acids (SALMON OIL-1000 PO) Take 1 tablet by mouth daily.     . potassium chloride SA (K-DUR,KLOR-CON) 20 MEQ tablet Take 1 tablet (20 mEq total) by mouth daily. 30 tablet 0  . vitamin C (ASCORBIC ACID) 500 MG tablet Take 500 mg by mouth 2 (two) times daily.     No current facility-administered medications for this visit.     Past Surgical History    Procedure Laterality Date  . Tonsillectomy    . Eye surgery Right Aug. 2014    Cataract  . Eye surgery Left Oct. 2014    Cataract  . Abdominal hysterectomy  1984    Total  . Video bronchoscopy with endobronchial navigation N/A 04/20/2014    Procedure: VIDEO BRONCHOSCOPY WITH ENDOBRONCHIAL NAVIGATION;  Surgeon: Collene Gobble, MD;  Location: [facility];  Service: Thoracic;  Laterality: N/A;  . Video bronchoscopy with endobronchial ultrasound N/A 04/20/2014    Procedure: VIDEO BRONCHOSCOPY WITH ENDOBRONCHIAL ULTRASOUND;  Surgeon: Collene Gobble, MD;  Location: [facility];  Service: Thoracic;  Laterality: N/A;     Allergies  Allergen Reactions  . Poison Ivy Extract [Extract Of Poison Ivy] Rash  . Sulfa Antibiotics Rash  . Sulfacetamide Sodium Rash      Family History  Problem Relation Age of Onset  . Heart disease Maternal Grandmother   . Cancer Brother     kidney  . Stroke Brother      Social History Brittney Thomas reports that she has never smoked. She has never used smokeless tobacco. Brittney Thomas reports that she does not drink alcohol.   Review of Systems CONSTITUTIONAL: No weight loss, fever, chills, weakness or fatigue.  HEENT: Eyes: No visual loss, blurred vision, double vision or yellow sclerae.No hearing loss, sneezing, congestion, runny nose or sore throat.  SKIN: No rash or itching.  CARDIOVASCULAR: no chest pain, no palpitaitons RESPIRATORY: No cough or sputum.  GASTROINTESTINAL: No anorexia, nausea, vomiting or diarrhea. No abdominal pain or blood.  GENITOURINARY: No burning on urination, no polyuria NEUROLOGICAL: No headache, dizziness, syncope, paralysis, ataxia, numbness or tingling in the extremities. No change in bowel or bladder control.  MUSCULOSKELETAL: No muscle, back pain, joint pain or stiffness.  LYMPHATICS: No enlarged nodes. No history of splenectomy.  PSYCHIATRIC: No history of depression or anxiety.  ENDOCRINOLOGIC: No reports of sweating, cold or heat  intolerance. No polyuria or polydipsia.  [name]   Physical Examination Filed Vitals:   12/30/14 0943  BP: 102/58  Pulse: 126   Filed Vitals:   12/30/14 0943  Height: '5\' 7"'$  (1.702 m)  Weight: 164 lb (74.39 kg)   Manual heart rate 105  Gen: resting comfortably, no acute distress HEENT: no scleral icterus, pupils equal round and reactive, no palptable cervical adenopathy,  CV: irreg, rate 105, no m/r/g Resp: Clear to auscultation bilaterally GI: abdomen is soft, non-tender, non-distended, normal bowel sounds, no hepatosplenomegaly MSK: extremities are warm, no edema.  Skin: warm, no rash Neuro:  no focal deficits Psych: appropriate affect   Diagnostic Studies 11/2014 echo Study Conclusions  - Left ventricle: The cavity size  was normal. Wall thickness was normal. Systolic function was normal. The estimated ejection fraction was in the range of 60% to 65%. Wall motion was normal; there were no regional wall motion abnormalities. - Aortic valve: Mildly calcified annulus. Mildly thickened, mildly calcified leaflets. There was mild regurgitation. - Mitral valve: There was mild regurgitation. - Left atrium: The atrium was severely dilated. - Right ventricle: The cavity size was severely dilated. Systolic function was mildly reduced. - Right atrium: The atrium was severely dilated. - Tricuspid valve: There was severe regurgitation. - Pulmonic valve: There was moderate regurgitation. - Pulmonary arteries: PA peak pressure: 31 mm Hg (S). - Pericardium, extracardiac: A small pericardial effusion was identified circumferential to the heart. There was no evidence of hemodynamic compromise.    Assessment and Plan   1. Chronic diastolic HF with pulm HTN and RV dysfunction - continue current diuretics  2. Afib - rates elevated to 105 in clinic, she has not taken any of her av noval agents today. Will continue to follow at this time. Continue eliquis  3. Hx of  DVT/PE - continue eliquis  F/u 2-3 weeks   Arnoldo Lenis, M.D.

## 2015-01-01 ENCOUNTER — Encounter: Payer: Self-pay | Admitting: Cardiology

## 2015-01-04 ENCOUNTER — Other Ambulatory Visit (HOSPITAL_BASED_OUTPATIENT_CLINIC_OR_DEPARTMENT_OTHER): Payer: Medicare Other

## 2015-01-04 DIAGNOSIS — R799 Abnormal finding of blood chemistry, unspecified: Secondary | ICD-10-CM

## 2015-01-04 DIAGNOSIS — C3412 Malignant neoplasm of upper lobe, left bronchus or lung: Secondary | ICD-10-CM

## 2015-01-04 LAB — BASIC METABOLIC PANEL (CC13)
Anion Gap: 13 mEq/L — ABNORMAL HIGH (ref 3–11)
BUN: 69.1 mg/dL — AB (ref 7.0–26.0)
CO2: 36 mEq/L — ABNORMAL HIGH (ref 22–29)
CREATININE: 2.2 mg/dL — AB (ref 0.6–1.1)
Calcium: 9.6 mg/dL (ref 8.4–10.4)
Chloride: 87 mEq/L — ABNORMAL LOW (ref 98–109)
EGFR: 20 mL/min/{1.73_m2} — AB (ref >90)
Glucose: 103 mg/dl (ref 70–140)
Potassium: 4.6 mEq/L (ref 3.5–5.1)
Sodium: 136 mEq/L (ref 136–145)

## 2015-01-06 ENCOUNTER — Telehealth: Payer: Self-pay | Admitting: *Deleted

## 2015-01-06 DIAGNOSIS — C3490 Malignant neoplasm of unspecified part of unspecified bronchus or lung: Secondary | ICD-10-CM

## 2015-01-06 NOTE — Telephone Encounter (Signed)
Call from patient's daughter [name].  "Can you ask Dr. Julien Nordmann if mom can resume the Iressa.  She see's a cardiologist who believes the change in her kidney and liver function lab values is from the diuretic she takes.  She continues Lasix 40 mg because there is so much fluid in her body still needing to be eliminated."  Return number for [name] [phone].

## 2015-01-07 NOTE — Telephone Encounter (Signed)
Still like to see some improvement in her kidney function before resuming it. We need to recheck B met next week to make a decision. 

## 2015-01-09 ENCOUNTER — Telehealth: Payer: Self-pay | Admitting: Internal Medicine

## 2015-01-09 NOTE — Telephone Encounter (Signed)
She can have repeat C Met this week.

## 2015-01-09 NOTE — Telephone Encounter (Signed)
RECEIVED A CALL FROM PT.'S DAUGHTER CONCERNING RESTARTING IRESSA. INFORMED PT.'S DAUGHTER THAT A MESSAGE HAS BEEN SENT TO DR.MOHAMED CONCERNING THE NEED FOR LAB WORK THIS WEEK OR ON 01/17/15 BEFORE RESTARTING IRESSA. SHE VOICES UNDERSTANDING.

## 2015-01-09 NOTE — Telephone Encounter (Signed)
s.w. pt dtr and advised on wednesday appt.Marland KitchenMarland KitchenMarland KitchenMarland Kitchenpt ok and aware

## 2015-01-11 ENCOUNTER — Other Ambulatory Visit (HOSPITAL_BASED_OUTPATIENT_CLINIC_OR_DEPARTMENT_OTHER): Payer: Medicare Other

## 2015-01-11 DIAGNOSIS — C3400 Malignant neoplasm of unspecified main bronchus: Secondary | ICD-10-CM

## 2015-01-11 DIAGNOSIS — C3412 Malignant neoplasm of upper lobe, left bronchus or lung: Secondary | ICD-10-CM

## 2015-01-11 LAB — COMPREHENSIVE METABOLIC PANEL (CC13)
ALK PHOS: 144 U/L (ref 40–150)
ALT: 16 U/L (ref 0–55)
AST: 23 U/L (ref 5–34)
Albumin: 3 g/dL — ABNORMAL LOW (ref 3.5–5.0)
Anion Gap: 10 mEq/L (ref 3–11)
BUN: 73.5 mg/dL — ABNORMAL HIGH (ref 7.0–26.0)
CALCIUM: 9.4 mg/dL (ref 8.4–10.4)
CO2: 41 mEq/L — ABNORMAL HIGH (ref 22–29)
Chloride: 88 mEq/L — ABNORMAL LOW (ref 98–109)
Creatinine: 2 mg/dL — ABNORMAL HIGH (ref 0.6–1.1)
EGFR: 23 mL/min/{1.73_m2} — AB (ref >90)
Glucose: 98 mg/dl (ref 70–140)
Potassium: 4.4 mEq/L (ref 3.5–5.1)
SODIUM: 139 meq/L (ref 136–145)
Total Bilirubin: 0.84 mg/dL (ref 0.20–1.20)
Total Protein: 6 g/dL — ABNORMAL LOW (ref 6.4–8.3)

## 2015-01-11 LAB — CBC WITH DIFFERENTIAL/PLATELET
BASO%: 0.7 % (ref 0.0–2.0)
Basophils Absolute: 0 10*3/uL (ref 0.0–0.1)
EOS ABS: 0 10*3/uL (ref 0.0–0.5)
EOS%: 0.7 % (ref 0.0–7.0)
HCT: 35.7 % (ref 34.8–46.6)
HGB: 11.7 g/dL (ref 11.6–15.9)
LYMPH#: 0.6 10*3/uL — AB (ref 0.9–3.3)
LYMPH%: 16.8 % (ref 14.0–49.7)
MCH: 34.2 pg — ABNORMAL HIGH (ref 25.1–34.0)
MCHC: 32.6 g/dL (ref 31.5–36.0)
MCV: 104.8 fL — ABNORMAL HIGH (ref 79.5–101.0)
MONO#: 0.5 10*3/uL (ref 0.1–0.9)
MONO%: 16.5 % — ABNORMAL HIGH (ref 0.0–14.0)
NEUT#: 2.2 10*3/uL (ref 1.5–6.5)
NEUT%: 65.3 % (ref 38.4–76.8)
PLATELETS: 142 10*3/uL — AB (ref 145–400)
RBC: 3.41 10*6/uL — ABNORMAL LOW (ref 3.70–5.45)
RDW: 16.4 % — ABNORMAL HIGH (ref 11.2–14.5)
WBC: 3.3 10*3/uL — ABNORMAL LOW (ref 3.9–10.3)

## 2015-01-13 ENCOUNTER — Telehealth: Payer: Self-pay | Admitting: *Deleted

## 2015-01-13 NOTE — Telephone Encounter (Signed)
OK to resume her Iressa.

## 2015-01-13 NOTE — Telephone Encounter (Signed)
TC from daughter, Brittney Thomas regarding labs done on 01/11/15 and whether patient should resume her oral chemo.  Pt is due for repeat labs again on 01/17/15 and to see Adrena Johnson,PA. As renal function tests remain elevated, advised daughter on upcoming appt on Tuesday and to discuss with Adrena Johnson/Dr. Mohamed at that time.

## 2015-01-13 NOTE — Telephone Encounter (Signed)
TC to sue with instructions per Dr. Julien Nordmann, that pt can resume her Iressa. Left vm message on identified phone with that information.

## 2015-01-17 ENCOUNTER — Ambulatory Visit (HOSPITAL_BASED_OUTPATIENT_CLINIC_OR_DEPARTMENT_OTHER): Payer: Medicare Other | Admitting: Physician Assistant

## 2015-01-17 ENCOUNTER — Encounter: Payer: Self-pay | Admitting: Physician Assistant

## 2015-01-17 ENCOUNTER — Other Ambulatory Visit (HOSPITAL_BASED_OUTPATIENT_CLINIC_OR_DEPARTMENT_OTHER): Payer: Medicare Other

## 2015-01-17 ENCOUNTER — Other Ambulatory Visit: Payer: Self-pay | Admitting: *Deleted

## 2015-01-17 ENCOUNTER — Telehealth: Payer: Self-pay | Admitting: Internal Medicine

## 2015-01-17 VITALS — BP 110/74 | HR 107 | Temp 97.8°F | Resp 17 | Ht 67.0 in | Wt 162.0 lb

## 2015-01-17 DIAGNOSIS — C3400 Malignant neoplasm of unspecified main bronchus: Secondary | ICD-10-CM | POA: Diagnosis not present

## 2015-01-17 DIAGNOSIS — C3490 Malignant neoplasm of unspecified part of unspecified bronchus or lung: Secondary | ICD-10-CM

## 2015-01-17 DIAGNOSIS — C3412 Malignant neoplasm of upper lobe, left bronchus or lung: Secondary | ICD-10-CM

## 2015-01-17 DIAGNOSIS — N289 Disorder of kidney and ureter, unspecified: Secondary | ICD-10-CM

## 2015-01-17 DIAGNOSIS — I2782 Chronic pulmonary embolism: Secondary | ICD-10-CM

## 2015-01-17 DIAGNOSIS — Z5111 Encounter for antineoplastic chemotherapy: Secondary | ICD-10-CM

## 2015-01-17 LAB — COMPREHENSIVE METABOLIC PANEL (CC13)
ALBUMIN: 3.3 g/dL — AB (ref 3.5–5.0)
ALK PHOS: 129 U/L (ref 40–150)
ALT: 16 U/L (ref 0–55)
AST: 27 U/L (ref 5–34)
Anion Gap: 14 mEq/L — ABNORMAL HIGH (ref 3–11)
BUN: 62.2 mg/dL — ABNORMAL HIGH (ref 7.0–26.0)
CO2: 45 mEq/L — ABNORMAL HIGH (ref 22–29)
CREATININE: 2 mg/dL — AB (ref 0.6–1.1)
Calcium: 10 mg/dL (ref 8.4–10.4)
Chloride: 84 mEq/L — ABNORMAL LOW (ref 98–109)
EGFR: 23 mL/min/{1.73_m2} — ABNORMAL LOW (ref >90)
Glucose: 75 mg/dl (ref 70–140)
Potassium: 4.1 mEq/L (ref 3.5–5.1)
Sodium: 143 mEq/L (ref 136–145)
Total Bilirubin: 1.29 mg/dL — ABNORMAL HIGH (ref 0.20–1.20)
Total Protein: 6.6 g/dL (ref 6.4–8.3)

## 2015-01-17 LAB — CBC WITH DIFFERENTIAL/PLATELET
BASO%: 0.7 % (ref 0.0–2.0)
BASOS ABS: 0 10*3/uL (ref 0.0–0.1)
EOS ABS: 0 10*3/uL (ref 0.0–0.5)
EOS%: 0.2 % (ref 0.0–7.0)
HEMATOCRIT: 36.6 % (ref 34.8–46.6)
HGB: 12 g/dL (ref 11.6–15.9)
LYMPH%: 16 % (ref 14.0–49.7)
MCH: 34.3 pg — AB (ref 25.1–34.0)
MCHC: 32.8 g/dL (ref 31.5–36.0)
MCV: 104.8 fL — AB (ref 79.5–101.0)
MONO#: 0.8 10*3/uL (ref 0.1–0.9)
MONO%: 20.3 % — ABNORMAL HIGH (ref 0.0–14.0)
NEUT%: 62.8 % (ref 38.4–76.8)
NEUTROS ABS: 2.4 10*3/uL (ref 1.5–6.5)
Platelets: 112 10*3/uL — ABNORMAL LOW (ref 145–400)
RBC: 3.49 10*6/uL — ABNORMAL LOW (ref 3.70–5.45)
RDW: 17.2 % — ABNORMAL HIGH (ref 11.2–14.5)
WBC: 3.9 10*3/uL (ref 3.9–10.3)
lymph#: 0.6 10*3/uL — ABNORMAL LOW (ref 0.9–3.3)

## 2015-01-17 NOTE — Telephone Encounter (Signed)
Gave patient avs report and appointments for July and August.  °

## 2015-01-17 NOTE — Progress Notes (Addendum)
Brittney Thomas [phone]   [phone]  OFFICE PROGRESS NOTE  Brittney Thomas [address] [place] [address]  DIAGNOSIS: Lung cancer, main bronchus  Staging form: Lung, AJCC 7th Edition  Clinical stage from 05/03/2014: Stage IV (T1a, N3, M1b) - [facility] Molecular markers: positive for [id number], [id number], [id number]*  PRIOR THERAPY: systemic chemotherapy with carboplatin for an AUC of 5 and Alimta 500 mg/m given every 3 weeks. Status post one cycle, discontinued secondary to intolerance.  CURRENT THERAPY: Iressa 250 mg by mouth daily status post 6 months of treatment.  INTERVAL HISTORY: Brittney Thomas 81 y.o. female returns to the clinic today for follow-up visit accompanied by her daughter. The patient is feeling fine today with no specific complaints except for fatigue. She has been off of the Iressa for approximately 3 weeks. She reports that her cardiologist thinks that the abnormalities in her LFTs and renal abnormalities have been secondary to the metolazone she was placed on by her primary care giver. She has a follow-up with Kaleen Odea, PA-C in [place] who works for her primary care physician. She continues to have some bilateral lower extremity edema. She was tolerating her treatment with Iressa fairly well. She continues to have mild skin rash and few episodes of diarrhea. She denied having any significant chest pain but has shortness of breath with exertion was no cough or hemoptysis. The patient denied having any significant fever or chills, no nausea or vomiting.    MEDICAL HISTORY: Past Medical History  Diagnosis Date  . Ulcer 06-08-13    Left medial ankle  . Hypertension   . Thyroid disease     Hypo-Thyroidism  . Esophageal reflux   . COPD (chronic obstructive pulmonary disease)   . Peripheral vascular disease   . Hypothyroidism   . Insomnia     takes lorazepam  . Arthritis   . Dysrhythmia   .  DVT (deep venous thrombosis) 2008  . Paroxysmal a-fib 2008    Anticoag stopped due to rectus sheath bleed  . PE (pulmonary thromboembolism) 2008  . Wears dentures   . Moderate to severe pulmonary hypertension   . Acute and chronic respiratory failure with hypoxia 06/14/2014    December 2015 2 L of oxygen continuously   . Lung cancer, main bronchus 05/03/2014    LUL, non small cell   . Adenocarcinoma, lung 04/15/2014    EBUS 04/20/14 >NON small cell carcinoma , adenocarcinoma ;LEFT >refer to Oncology     . Hemorrhagic shock 05/27/2014  . Spontaneous hemorrhage     Rectus sheath hematoma, while on anticoag    ALLERGIES:  is allergic to poison ivy extract; sulfa antibiotics; and sulfacetamide sodium.  MEDICATIONS:  Current Outpatient Prescriptions  Medication Sig Dispense Refill  . acetaminophen (TYLENOL) 500 MG tablet Take 500 mg by mouth every 6 (six) hours as needed (pain).    [name] apixaban (ELIQUIS) 2.5 MG TABS tablet Take 1 tablet (2.5 mg total) by mouth 2 (two) times daily. 60 tablet 0  . calcium-vitamin D (OSCAL WITH D) 500-200 MG-UNIT per tablet Take 1 tablet by mouth daily with breakfast.     . diltiazem (CARDIZEM CD) 120 MG 24 hr capsule Take 1 capsule (120 mg total) by mouth daily. 30 capsule 0  . folic acid (FOLVITE) 1 MG tablet Take 1 tablet (1 mg total) by mouth daily. 30 tablet 3  . furosemide (LASIX) 40 MG tablet Take 1 tablet (40  mg total) by mouth 2 (two) times daily. 60 tablet 0  . HYDROcodone-acetaminophen (NORCO/VICODIN) 5-325 MG per tablet Take 1 tablet by mouth every 6 (six) hours as needed for moderate pain. 30 tablet 0  . IRESSA 250 MG tablet TAKE 1 TABLET BY MOUTH ONCE DAILY (Patient not taking: Reported on 12/30/2014) 30 tablet 2  . levothyroxine (SYNTHROID, LEVOTHROID) 88 MCG tablet Take 88 mcg by mouth daily before breakfast.    . metoprolol tartrate (LOPRESSOR) 25 MG tablet Take 0.5 tablets (12.5 mg total) by mouth 2 (two) times daily. 60 tablet 0  . Omega-3 Fatty  Acids (SALMON OIL-1000 PO) Take 1 tablet by mouth daily.     . potassium chloride SA (K-DUR,KLOR-CON) 20 MEQ tablet Take 1 tablet (20 mEq total) by mouth daily. 30 tablet 0  . vitamin C (ASCORBIC ACID) 500 MG tablet Take 500 mg by mouth 2 (two) times daily.     No current facility-administered medications for this visit.    SURGICAL HISTORY:  Past Surgical History  Procedure Laterality Date  . Tonsillectomy    . Eye surgery Right Aug. 2014    Cataract  . Eye surgery Left Oct. 2014    Cataract  . Abdominal hysterectomy  1984    Total  . Video bronchoscopy with endobronchial navigation N/A 04/20/2014    Procedure: VIDEO BRONCHOSCOPY WITH ENDOBRONCHIAL NAVIGATION;  Surgeon: Collene Gobble, MD;  Location: [facility];  Service: Thoracic;  Laterality: N/A;  . Video bronchoscopy with endobronchial ultrasound N/A 04/20/2014    Procedure: VIDEO BRONCHOSCOPY WITH ENDOBRONCHIAL ULTRASOUND;  Surgeon: Collene Gobble, MD;  Location: [facility];  Service: Thoracic;  Laterality: N/A;    REVIEW OF SYSTEMS:  Constitutional: positive for fatigue Eyes: negative Ears, nose, mouth, throat, and face: negative Respiratory: positive for dyspnea on exertion Cardiovascular: negative Gastrointestinal: positive for diarrhea Genitourinary:negative Integument/breast: negative Hematologic/lymphatic: negative Musculoskeletal:negative Neurological: negative Behavioral/Psych: negative Endocrine: negative Allergic/Immunologic: negative   PHYSICAL EXAMINATION: General appearance: alert, cooperative, fatigued and no distress Head: Normocephalic, without obvious abnormality, atraumatic Neck: no adenopathy, no JVD, supple, symmetrical, trachea midline and thyroid not enlarged, symmetric, no tenderness/mass/nodules Lymph nodes: Cervical, supraclavicular, and axillary nodes normal. Resp: clear to auscultation bilaterally Back: symmetric, no curvature. ROM normal. No CVA tenderness. Cardio: regular rate and rhythm, S1, S2  normal, no murmur, click, rub or gallop GI: soft, non-tender; bowel sounds normal; no masses,  no organomegaly Extremities: extremities normal, atraumatic, no cyanosis or edema Neurologic: Alert and oriented X 3, normal strength and tone. Normal symmetric reflexes. Normal coordination and gait Skin: grade 1 acneform eruptions primarily on the face, no evidence of superinfection  ECOG PERFORMANCE STATUS: 2 - Symptomatic, <50% confined to bed  Blood pressure 110/74, pulse 107, temperature 97.8 F (36.6 C), temperature source Oral, resp. rate 17, height 5' 7"  (1.702 m), weight 162 lb (73.483 kg), SpO2 93 %.  LABORATORY DATA: Lab Results  Component Value Date   WBC 3.9 01/17/2015   HGB 12.0 01/17/2015   HCT 36.6 01/17/2015   MCV 104.8* 01/17/2015   PLT 112* 01/17/2015      Chemistry      Component Value Date/Time   NA 143 01/17/2015 1316   NA 141 12/09/2014 0311   K 4.1 01/17/2015 1316   K 3.6 12/09/2014 0311   CL 90* 12/09/2014 0311   CO2 45* 01/17/2015 1316   CO2 41* 12/09/2014 0311   BUN 62.2* 01/17/2015 1316   BUN 53* 12/09/2014 0311   CREATININE 2.0* 01/17/2015 1316  CREATININE 1.49* 12/09/2014 0311      Component Value Date/Time   CALCIUM 10.0 01/17/2015 1316   CALCIUM 8.3* 12/09/2014 0311   ALKPHOS 129 01/17/2015 1316   ALKPHOS 119 12/07/2014 0359   AST 27 01/17/2015 1316   AST 26 12/07/2014 0359   ALT 16 01/17/2015 1316   ALT 20 12/07/2014 0359   BILITOT 1.29* 01/17/2015 1316   BILITOT 1.2 12/07/2014 0359       RADIOGRAPHIC STUDIES: No results found.  ASSESSMENT AND PLAN: This is a very pleasant 81 years old white female with a stage IV non-small cell lung cancer, adenocarcinoma with positive EGFR mutation currently undergoing treatment with Iressa 250 mg by mouth daily for the last 6 months and tolerating her treatment fairly well.  The recent CT scan of the chest, abdomen and pelvis showed no evidence for disease progression. The patient was discussed  with and also seen by Dr. Julien Nordmann. She will resume her Iressa. We will continue to have her get weekly labs and have her return in one month for another symptom management visit. She is to follow-up with her primary care physician regarding her diarrhea headaches. We will continue to monitor her renal and liver function studies very closely. For diarrhea, the patient will continue on Imodium on as-needed basis. The patient was advised to call immediately if she has any concerning symptoms in the interval. The patient voices understanding of current disease status and treatment options and is in agreement with the current care plan.  All questions were answered. The patient knows to call the clinic with any problems, questions or concerns. We can certainly see the patient much sooner if necessary.  Carlton Adam, PA-C 01/17/2015  ADDENDUM: Hematology/Oncology Attending: I had a face to face encounter with the patient. I recommended her care plan. This is a very pleasant 81 years old white female with metastatic non-small cell lung cancer with positive EGFR mutation and currently undergoing treatment with Iressa 250 mg by mouth daily status post 6 months of treatment. The patient is feeling fine today with no specific complaints except for mild fatigue. Her treatment with Iressa has been on hold secondary to worsening renal function but this was mainly related to diuresis ordered by her cardiologist. Her serum creatinine as is stable today. I recommended for the patient to resume her treatment with Iressa. The patient would come back for follow-up visit in one month for reevaluation. She was advised to call immediately if she has any concerning symptoms in the interval.  Disclaimer: This note was dictated with voice recognition software. Similar sounding words can inadvertently be transcribed and may be missed upon review. Eilleen Kempf., MD 01/22/2015

## 2015-01-20 NOTE — Patient Instructions (Signed)
Resume your Iressa at the current dose Continue weekly labs Follow-up in one month

## 2015-01-22 DIAGNOSIS — Z5111 Encounter for antineoplastic chemotherapy: Secondary | ICD-10-CM | POA: Insufficient documentation

## 2015-01-23 ENCOUNTER — Encounter: Payer: Self-pay | Admitting: Cardiology

## 2015-01-23 ENCOUNTER — Ambulatory Visit (INDEPENDENT_AMBULATORY_CARE_PROVIDER_SITE_OTHER): Payer: Medicare Other | Admitting: Cardiology

## 2015-01-23 VITALS — BP 106/72 | HR 93 | Ht 67.0 in | Wt 165.0 lb

## 2015-01-23 DIAGNOSIS — I4891 Unspecified atrial fibrillation: Secondary | ICD-10-CM | POA: Diagnosis not present

## 2015-01-23 DIAGNOSIS — I5032 Chronic diastolic (congestive) heart failure: Secondary | ICD-10-CM

## 2015-01-23 DIAGNOSIS — I272 Pulmonary hypertension, unspecified: Secondary | ICD-10-CM

## 2015-01-23 DIAGNOSIS — I270 Primary pulmonary hypertension: Secondary | ICD-10-CM | POA: Diagnosis not present

## 2015-01-23 DIAGNOSIS — I482 Chronic atrial fibrillation: Secondary | ICD-10-CM

## 2015-01-23 MED ORDER — DILTIAZEM HCL ER COATED BEADS 180 MG PO CP24: 180.0000 mg | ORAL_CAPSULE | Freq: Every day | ORAL | Status: AC

## 2015-01-23 MED ORDER — FUROSEMIDE 40 MG PO TABS: 40.0000 mg | ORAL_TABLET | Freq: Two times a day (BID) | ORAL | Status: AC

## 2015-01-23 NOTE — Patient Instructions (Signed)
Your physician recommends that you schedule a follow-up appointment in: 1 month with Dr Harl Bowie     INCREASE Diltiazem to 180 mg daily      Thank you for choosing [facility] !

## 2015-01-23 NOTE — Progress Notes (Signed)
Patient ID: Brittney Thomas, female   DOB: 1933-04-20, 81 y.o.   MRN: [id number]     Clinical Summary Brittney Thomas is a 81 y.o.female seen today for follow up of the following medical problems.   1. Chronic diastolic heart failure with RV dysfunction w/ pulmonary HTN - echo 11/2014 LVEF 60-65%, severe LAE, RV severely dilated with mildly reduced function, severe TR, PASP 31 - from echo 05/2014 PASP 65   - Cr 1.8-1.2 over the last month, BUN 50-70. In early June Cr around 1.3. Uptrend since her lasix was increased to '40mg'$  bid during admission in June. She also reports her pcp had her on metolazone until about a week ago. Weight at discharge was 171 lbs, weight today 165 lbs.   2. Lung CA - stage IV metastatic nonsmall cell CA, currently undergoing chemo. - 08/2014 MRI brain no lesions  3. History of PE/DVT - IVC filter placed 4/16, anticoagulation was stopped and eventually restarted due to rectus sheath hematoma  4. Afib - denies any palpitations - digoxin stopped Nov 2015, rate controlled with lopressor and dilt - prior cardiologist at [facility]  Past Medical History  Diagnosis Date  . Ulcer 06-08-13    Left medial ankle  . Hypertension   . Thyroid disease     Hypo-Thyroidism  . Esophageal reflux   . COPD (chronic obstructive pulmonary disease)   . Peripheral vascular disease   . Hypothyroidism   . Insomnia     takes lorazepam  . Arthritis   . Dysrhythmia   . DVT (deep venous thrombosis) 2008  . Paroxysmal a-fib 2008    Anticoag stopped due to rectus sheath bleed  . PE (pulmonary thromboembolism) 2008  . Wears dentures   . Moderate to severe pulmonary hypertension   . Acute and chronic respiratory failure with hypoxia 06/14/2014    December 2015 2 L of oxygen continuously   . Lung cancer, main bronchus 05/03/2014    LUL, non small cell   . Adenocarcinoma, lung 04/15/2014    EBUS 04/20/14 >NON small cell carcinoma , adenocarcinoma ;LEFT >refer to Oncology     . Hemorrhagic  shock 05/27/2014  . Spontaneous hemorrhage     Rectus sheath hematoma, while on anticoag     Allergies  Allergen Reactions  . Poison Ivy Extract [Extract Of Poison Ivy] Rash  . Sulfa Antibiotics Rash  . Sulfacetamide Sodium Rash     Current Outpatient Prescriptions  Medication Sig Dispense Refill  . acetaminophen (TYLENOL) 500 MG tablet Take 500 mg by mouth every 6 (six) hours as needed (pain).    [name] apixaban (ELIQUIS) 2.5 MG TABS tablet Take 1 tablet (2.5 mg total) by mouth 2 (two) times daily. 60 tablet 0  . calcium-vitamin D (OSCAL WITH D) 500-200 MG-UNIT per tablet Take 1 tablet by mouth daily with breakfast.     . diltiazem (CARDIZEM CD) 120 MG 24 hr capsule Take 1 capsule (120 mg total) by mouth daily. 30 capsule 0  . folic acid (FOLVITE) 1 MG tablet Take 1 tablet (1 mg total) by mouth daily. 30 tablet 3  . furosemide (LASIX) 40 MG tablet Take 1 tablet (40 mg total) by mouth 2 (two) times daily. 60 tablet 0  . HYDROcodone-acetaminophen (NORCO/VICODIN) 5-325 MG per tablet Take 1 tablet by mouth every 6 (six) hours as needed for moderate pain. 30 tablet 0  . IRESSA 250 MG tablet TAKE 1 TABLET BY MOUTH ONCE DAILY (Patient not taking: Reported on 12/30/2014) 30  tablet 2  . levothyroxine (SYNTHROID, LEVOTHROID) 88 MCG tablet Take 88 mcg by mouth daily before breakfast.    . metoprolol tartrate (LOPRESSOR) 25 MG tablet Take 0.5 tablets (12.5 mg total) by mouth 2 (two) times daily. 60 tablet 0  . Omega-3 Fatty Acids (SALMON OIL-1000 PO) Take 1 tablet by mouth daily.     . potassium chloride SA (K-DUR,KLOR-CON) 20 MEQ tablet Take 1 tablet (20 mEq total) by mouth daily. 30 tablet 0  . vitamin C (ASCORBIC ACID) 500 MG tablet Take 500 mg by mouth 2 (two) times daily.     No current facility-administered medications for this visit.     Past Surgical History  Procedure Laterality Date  . Tonsillectomy    . Eye surgery Right Aug. 2014    Cataract  . Eye surgery Left Oct. 2014    Cataract   . Abdominal hysterectomy  1984    Total  . Video bronchoscopy with endobronchial navigation N/A 04/20/2014    Procedure: VIDEO BRONCHOSCOPY WITH ENDOBRONCHIAL NAVIGATION;  Surgeon: Collene Gobble, MD;  Location: [facility];  Service: Thoracic;  Laterality: N/A;  . Video bronchoscopy with endobronchial ultrasound N/A 04/20/2014    Procedure: VIDEO BRONCHOSCOPY WITH ENDOBRONCHIAL ULTRASOUND;  Surgeon: Collene Gobble, MD;  Location: [facility];  Service: Thoracic;  Laterality: N/A;     Allergies  Allergen Reactions  . Poison Ivy Extract [Extract Of Poison Ivy] Rash  . Sulfa Antibiotics Rash  . Sulfacetamide Sodium Rash      Family History  Problem Relation Age of Onset  . Heart disease Maternal Grandmother   . Cancer Brother     kidney  . Stroke Brother      Social History Brittney Thomas reports that she has never smoked. She has never used smokeless tobacco. Brittney Thomas reports that she does not drink alcohol.   Review of Systems CONSTITUTIONAL: No weight loss, fever, chills, weakness or fatigue.  HEENT: Eyes: No visual loss, blurred vision, double vision or yellow sclerae.No hearing loss, sneezing, congestion, runny nose or sore throat.  SKIN: No rash or itching.  CARDIOVASCULAR: per HPI RESPIRATORY: No  cough or sputum.  GASTROINTESTINAL: No anorexia, nausea, vomiting or diarrhea. No abdominal pain or blood.  GENITOURINARY: No burning on urination, no polyuria NEUROLOGICAL: No headache, dizziness, syncope, paralysis, ataxia, numbness or tingling in the extremities. No change in bowel or bladder control.  MUSCULOSKELETAL: No muscle, back pain, joint pain or stiffness.  LYMPHATICS: No enlarged nodes. No history of splenectomy.  PSYCHIATRIC: No history of depression or anxiety.  ENDOCRINOLOGIC: No reports of sweating, cold or heat intolerance. No polyuria or polydipsia.  [name]   Physical Examination Filed Vitals:   01/23/15 1126  BP:   Pulse: 93   Filed Vitals:   01/23/15 1110    Height: '5\' 7"'$  (1.702 m)  Weight: 165 lb (74.844 kg)    Gen: resting comfortably, no acute distress HEENT: no scleral icterus, pupils equal round and reactive, no palptable cervical adenopathy,  CV: irreg, no m/r/g, no JVD Resp: Clear to auscultation bilaterally GI: abdomen is soft, non-tender, non-distended, normal bowel sounds, no hepatosplenomegaly MSK: extremities are warm, no edema.  Skin: warm, no rash Neuro:  no focal deficits Psych: appropriate affect      Assessment and Plan  1. Chronic diastolic HF with pulm HTN and RV dysfunction - uptrend in Cr likely due to metolazone that she is now off, Continue lasix '40mg'$  bid. She gets weekly labs from her oncologist, continue  to follow renal function.  2. Afib - rates elevated to 105 in clinic, will increase dilt to '180mg'$  daily. Continue eliquis  3. Hx of DVT/PE - continue eliquis  F/u 1 month      Arnoldo Lenis, M.D.

## 2015-01-24 ENCOUNTER — Telehealth: Payer: Self-pay | Admitting: Internal Medicine

## 2015-01-24 ENCOUNTER — Other Ambulatory Visit (HOSPITAL_BASED_OUTPATIENT_CLINIC_OR_DEPARTMENT_OTHER): Payer: Medicare Other

## 2015-01-24 ENCOUNTER — Other Ambulatory Visit: Payer: Medicare Other

## 2015-01-24 DIAGNOSIS — C3490 Malignant neoplasm of unspecified part of unspecified bronchus or lung: Secondary | ICD-10-CM

## 2015-01-24 DIAGNOSIS — C3400 Malignant neoplasm of unspecified main bronchus: Secondary | ICD-10-CM

## 2015-01-24 DIAGNOSIS — C3412 Malignant neoplasm of upper lobe, left bronchus or lung: Secondary | ICD-10-CM | POA: Diagnosis not present

## 2015-01-24 LAB — CBC WITH DIFFERENTIAL/PLATELET
BASO%: 1.6 % (ref 0.0–2.0)
Basophils Absolute: 0 10*3/uL (ref 0.0–0.1)
EOS%: 1.4 % (ref 0.0–7.0)
Eosinophils Absolute: 0 10*3/uL (ref 0.0–0.5)
HCT: 35.9 % (ref 34.8–46.6)
HGB: 11.7 g/dL (ref 11.6–15.9)
LYMPH%: 23.5 % (ref 14.0–49.7)
MCH: 34.1 pg — AB (ref 25.1–34.0)
MCHC: 32.6 g/dL (ref 31.5–36.0)
MCV: 104.6 fL — ABNORMAL HIGH (ref 79.5–101.0)
MONO#: 0.7 10*3/uL (ref 0.1–0.9)
MONO%: 24 % — AB (ref 0.0–14.0)
NEUT%: 49.5 % (ref 38.4–76.8)
NEUTROS ABS: 1.5 10*3/uL (ref 1.5–6.5)
Platelets: 118 10*3/uL — ABNORMAL LOW (ref 145–400)
RBC: 3.43 10*6/uL — AB (ref 3.70–5.45)
RDW: 16.8 % — ABNORMAL HIGH (ref 11.2–14.5)
WBC: 3.1 10*3/uL — AB (ref 3.9–10.3)
lymph#: 0.7 10*3/uL — ABNORMAL LOW (ref 0.9–3.3)

## 2015-01-24 LAB — COMPREHENSIVE METABOLIC PANEL (CC13)
ALT: 18 U/L (ref 0–55)
AST: 39 U/L — ABNORMAL HIGH (ref 5–34)
Albumin: 3.3 g/dL — ABNORMAL LOW (ref 3.5–5.0)
Alkaline Phosphatase: 159 U/L — ABNORMAL HIGH (ref 40–150)
Anion Gap: 9 mEq/L (ref 3–11)
BILIRUBIN TOTAL: 1.13 mg/dL (ref 0.20–1.20)
BUN: 60.1 mg/dL — AB (ref 7.0–26.0)
CO2: 40 meq/L — AB (ref 22–29)
CREATININE: 2.2 mg/dL — AB (ref 0.6–1.1)
Calcium: 9.3 mg/dL (ref 8.4–10.4)
Chloride: 90 mEq/L — ABNORMAL LOW (ref 98–109)
EGFR: 20 mL/min/{1.73_m2} — AB (ref >90)
GLUCOSE: 69 mg/dL — AB (ref 70–140)
Potassium: 4.7 mEq/L (ref 3.5–5.1)
Sodium: 139 mEq/L (ref 136–145)
TOTAL PROTEIN: 6.9 g/dL (ref 6.4–8.3)

## 2015-01-24 LAB — RESEARCH LABS

## 2015-01-24 NOTE — Telephone Encounter (Signed)
s.w. pt dtr and move appt to earlier time...Marland Kitchenok and aware

## 2015-01-26 ENCOUNTER — Other Ambulatory Visit: Payer: Self-pay

## 2015-01-26 ENCOUNTER — Other Ambulatory Visit (HOSPITAL_COMMUNITY): Payer: Self-pay

## 2015-01-26 ENCOUNTER — Inpatient Hospital Stay (HOSPITAL_COMMUNITY)
Admission: EM | Admit: 2015-01-26 | Discharge: 2015-03-02 | DRG: 870 | Disposition: E | Payer: Medicare Other | Attending: Pulmonary Disease | Admitting: Pulmonary Disease

## 2015-01-26 ENCOUNTER — Emergency Department (HOSPITAL_COMMUNITY): Payer: Medicare Other

## 2015-01-26 ENCOUNTER — Encounter (HOSPITAL_COMMUNITY): Payer: Self-pay

## 2015-01-26 DIAGNOSIS — Z809 Family history of malignant neoplasm, unspecified: Secondary | ICD-10-CM

## 2015-01-26 DIAGNOSIS — Z8249 Family history of ischemic heart disease and other diseases of the circulatory system: Secondary | ICD-10-CM | POA: Diagnosis not present

## 2015-01-26 DIAGNOSIS — E875 Hyperkalemia: Secondary | ICD-10-CM | POA: Diagnosis present

## 2015-01-26 DIAGNOSIS — E873 Alkalosis: Secondary | ICD-10-CM | POA: Diagnosis present

## 2015-01-26 DIAGNOSIS — Z79891 Long term (current) use of opiate analgesic: Secondary | ICD-10-CM

## 2015-01-26 DIAGNOSIS — Z888 Allergy status to other drugs, medicaments and biological substances status: Secondary | ICD-10-CM

## 2015-01-26 DIAGNOSIS — R6521 Severe sepsis with septic shock: Secondary | ICD-10-CM | POA: Diagnosis present

## 2015-01-26 DIAGNOSIS — T68XXXA Hypothermia, initial encounter: Secondary | ICD-10-CM

## 2015-01-26 DIAGNOSIS — I959 Hypotension, unspecified: Secondary | ICD-10-CM | POA: Insufficient documentation

## 2015-01-26 DIAGNOSIS — D61810 Antineoplastic chemotherapy induced pancytopenia: Secondary | ICD-10-CM | POA: Diagnosis present

## 2015-01-26 DIAGNOSIS — Z889 Allergy status to unspecified drugs, medicaments and biological substances status: Secondary | ICD-10-CM

## 2015-01-26 DIAGNOSIS — R188 Other ascites: Secondary | ICD-10-CM | POA: Diagnosis present

## 2015-01-26 DIAGNOSIS — R627 Adult failure to thrive: Secondary | ICD-10-CM | POA: Diagnosis present

## 2015-01-26 DIAGNOSIS — Z791 Long term (current) use of non-steroidal anti-inflammatories (NSAID): Secondary | ICD-10-CM

## 2015-01-26 DIAGNOSIS — Z86711 Personal history of pulmonary embolism: Secondary | ICD-10-CM

## 2015-01-26 DIAGNOSIS — I480 Paroxysmal atrial fibrillation: Secondary | ICD-10-CM | POA: Diagnosis present

## 2015-01-26 DIAGNOSIS — J449 Chronic obstructive pulmonary disease, unspecified: Secondary | ICD-10-CM | POA: Diagnosis present

## 2015-01-26 DIAGNOSIS — J9819 Other pulmonary collapse: Secondary | ICD-10-CM | POA: Insufficient documentation

## 2015-01-26 DIAGNOSIS — I10 Essential (primary) hypertension: Secondary | ICD-10-CM | POA: Diagnosis present

## 2015-01-26 DIAGNOSIS — T451X5A Adverse effect of antineoplastic and immunosuppressive drugs, initial encounter: Secondary | ICD-10-CM | POA: Diagnosis present

## 2015-01-26 DIAGNOSIS — J9601 Acute respiratory failure with hypoxia: Secondary | ICD-10-CM | POA: Insufficient documentation

## 2015-01-26 DIAGNOSIS — Z79899 Other long term (current) drug therapy: Secondary | ICD-10-CM | POA: Diagnosis not present

## 2015-01-26 DIAGNOSIS — Z66 Do not resuscitate: Secondary | ICD-10-CM | POA: Diagnosis present

## 2015-01-26 DIAGNOSIS — Z452 Encounter for adjustment and management of vascular access device: Secondary | ICD-10-CM | POA: Diagnosis not present

## 2015-01-26 DIAGNOSIS — I9589 Other hypotension: Secondary | ICD-10-CM | POA: Diagnosis not present

## 2015-01-26 DIAGNOSIS — Z86718 Personal history of other venous thrombosis and embolism: Secondary | ICD-10-CM | POA: Diagnosis not present

## 2015-01-26 DIAGNOSIS — Z881 Allergy status to other antibiotic agents status: Secondary | ICD-10-CM

## 2015-01-26 DIAGNOSIS — K7460 Unspecified cirrhosis of liver: Secondary | ICD-10-CM | POA: Diagnosis present

## 2015-01-26 DIAGNOSIS — G4700 Insomnia, unspecified: Secondary | ICD-10-CM | POA: Diagnosis present

## 2015-01-26 DIAGNOSIS — J9602 Acute respiratory failure with hypercapnia: Secondary | ICD-10-CM

## 2015-01-26 DIAGNOSIS — C3412 Malignant neoplasm of upper lobe, left bronchus or lung: Secondary | ICD-10-CM | POA: Diagnosis present

## 2015-01-26 DIAGNOSIS — Z85118 Personal history of other malignant neoplasm of bronchus and lung: Secondary | ICD-10-CM

## 2015-01-26 DIAGNOSIS — E039 Hypothyroidism, unspecified: Secondary | ICD-10-CM | POA: Diagnosis present

## 2015-01-26 DIAGNOSIS — N170 Acute kidney failure with tubular necrosis: Secondary | ICD-10-CM | POA: Diagnosis present

## 2015-01-26 DIAGNOSIS — A419 Sepsis, unspecified organism: Principal | ICD-10-CM | POA: Diagnosis present

## 2015-01-26 DIAGNOSIS — R569 Unspecified convulsions: Secondary | ICD-10-CM | POA: Insufficient documentation

## 2015-01-26 DIAGNOSIS — Z515 Encounter for palliative care: Secondary | ICD-10-CM

## 2015-01-26 DIAGNOSIS — K219 Gastro-esophageal reflux disease without esophagitis: Secondary | ICD-10-CM | POA: Diagnosis present

## 2015-01-26 DIAGNOSIS — N390 Urinary tract infection, site not specified: Secondary | ICD-10-CM | POA: Insufficient documentation

## 2015-01-26 DIAGNOSIS — G9341 Metabolic encephalopathy: Secondary | ICD-10-CM | POA: Diagnosis present

## 2015-01-26 DIAGNOSIS — Z7901 Long term (current) use of anticoagulants: Secondary | ICD-10-CM

## 2015-01-26 DIAGNOSIS — I739 Peripheral vascular disease, unspecified: Secondary | ICD-10-CM | POA: Diagnosis present

## 2015-01-26 DIAGNOSIS — Z9981 Dependence on supplemental oxygen: Secondary | ICD-10-CM

## 2015-01-26 DIAGNOSIS — J9622 Acute and chronic respiratory failure with hypercapnia: Secondary | ICD-10-CM | POA: Diagnosis present

## 2015-01-26 DIAGNOSIS — L03116 Cellulitis of left lower limb: Secondary | ICD-10-CM | POA: Diagnosis present

## 2015-01-26 DIAGNOSIS — Z01818 Encounter for other preprocedural examination: Secondary | ICD-10-CM | POA: Diagnosis present

## 2015-01-26 DIAGNOSIS — M1990 Unspecified osteoarthritis, unspecified site: Secondary | ICD-10-CM | POA: Diagnosis present

## 2015-01-26 DIAGNOSIS — Z9221 Personal history of antineoplastic chemotherapy: Secondary | ICD-10-CM | POA: Diagnosis not present

## 2015-01-26 DIAGNOSIS — R680 Hypothermia, not associated with low environmental temperature: Secondary | ICD-10-CM | POA: Diagnosis present

## 2015-01-26 DIAGNOSIS — I272 Other secondary pulmonary hypertension: Secondary | ICD-10-CM | POA: Diagnosis present

## 2015-01-26 DIAGNOSIS — T17590A Other foreign object in bronchus causing asphyxiation, initial encounter: Secondary | ICD-10-CM | POA: Diagnosis not present

## 2015-01-26 DIAGNOSIS — E861 Hypovolemia: Secondary | ICD-10-CM | POA: Diagnosis present

## 2015-01-26 DIAGNOSIS — Z9889 Other specified postprocedural states: Secondary | ICD-10-CM

## 2015-01-26 DIAGNOSIS — J9621 Acute and chronic respiratory failure with hypoxia: Secondary | ICD-10-CM | POA: Diagnosis present

## 2015-01-26 DIAGNOSIS — J9690 Respiratory failure, unspecified, unspecified whether with hypoxia or hypercapnia: Secondary | ICD-10-CM

## 2015-01-26 DIAGNOSIS — R140 Abdominal distension (gaseous): Secondary | ICD-10-CM | POA: Insufficient documentation

## 2015-01-26 DIAGNOSIS — G9340 Encephalopathy, unspecified: Secondary | ICD-10-CM | POA: Diagnosis not present

## 2015-01-26 DIAGNOSIS — D899 Disorder involving the immune mechanism, unspecified: Secondary | ICD-10-CM | POA: Diagnosis present

## 2015-01-26 DIAGNOSIS — L03115 Cellulitis of right lower limb: Secondary | ICD-10-CM | POA: Diagnosis present

## 2015-01-26 DIAGNOSIS — Z4659 Encounter for fitting and adjustment of other gastrointestinal appliance and device: Secondary | ICD-10-CM | POA: Insufficient documentation

## 2015-01-26 LAB — URINALYSIS, ROUTINE W REFLEX MICROSCOPIC
GLUCOSE, UA: NEGATIVE mg/dL
KETONES UR: 15 mg/dL — AB
NITRITE: NEGATIVE
PROTEIN: 100 mg/dL — AB
SPECIFIC GRAVITY, URINE: 1.017 (ref 1.005–1.030)
UROBILINOGEN UA: 0.2 mg/dL (ref 0.0–1.0)
pH: 5 (ref 5.0–8.0)

## 2015-01-26 LAB — COMPREHENSIVE METABOLIC PANEL
ALK PHOS: 144 U/L — AB (ref 38–126)
ALT: 21 U/L (ref 14–54)
AST: 39 U/L (ref 15–41)
Albumin: 3.3 g/dL — ABNORMAL LOW (ref 3.5–5.0)
Anion gap: 12 (ref 5–15)
BILIRUBIN TOTAL: 1.4 mg/dL — AB (ref 0.3–1.2)
BUN: 67 mg/dL — AB (ref 6–20)
CO2: 38 mmol/L — AB (ref 22–32)
Calcium: 9.6 mg/dL (ref 8.9–10.3)
Chloride: 87 mmol/L — ABNORMAL LOW (ref 101–111)
Creatinine, Ser: 2.83 mg/dL — ABNORMAL HIGH (ref 0.44–1.00)
GFR calc Af Amer: 17 mL/min — ABNORMAL LOW (ref >60)
GFR, EST NON AFRICAN AMERICAN: 15 mL/min — AB (ref >60)
GLUCOSE: 93 mg/dL (ref 65–99)
POTASSIUM: 5.3 mmol/L — AB (ref 3.5–5.1)
Sodium: 137 mmol/L (ref 135–145)
Total Protein: 6.8 g/dL (ref 6.5–8.1)

## 2015-01-26 LAB — URINE MICROSCOPIC-ADD ON

## 2015-01-26 LAB — CBC WITH DIFFERENTIAL/PLATELET
BASOS ABS: 0 10*3/uL (ref 0.0–0.1)
BASOS PCT: 0 % (ref 0–1)
Eosinophils Absolute: 0 10*3/uL (ref 0.0–0.7)
Eosinophils Relative: 0 % (ref 0–5)
HEMATOCRIT: 38.5 % (ref 36.0–46.0)
Hemoglobin: 12.1 g/dL (ref 12.0–15.0)
LYMPHS ABS: 0.6 10*3/uL — AB (ref 0.7–4.0)
Lymphocytes Relative: 16 % (ref 12–46)
MCH: 34.4 pg — ABNORMAL HIGH (ref 26.0–34.0)
MCHC: 31.4 g/dL (ref 30.0–36.0)
MCV: 109.4 fL — ABNORMAL HIGH (ref 78.0–100.0)
MONO ABS: 0.6 10*3/uL (ref 0.1–1.0)
Monocytes Relative: 17 % — ABNORMAL HIGH (ref 3–12)
NEUTROS ABS: 2.5 10*3/uL (ref 1.7–7.7)
Neutrophils Relative %: 67 % (ref 43–77)
Platelets: 122 10*3/uL — ABNORMAL LOW (ref 150–400)
RBC: 3.52 MIL/uL — ABNORMAL LOW (ref 3.87–5.11)
RDW: 17.8 % — AB (ref 11.5–15.5)
WBC: 3.7 10*3/uL — AB (ref 4.0–10.5)

## 2015-01-26 LAB — I-STAT CG4 LACTIC ACID, ED
Lactic Acid, Venous: 2.71 mmol/L (ref 0.5–2.0)
Lactic Acid, Venous: 2.07 mmol/L (ref 0.5–2.0)

## 2015-01-26 LAB — CORTISOL

## 2015-01-26 LAB — I-STAT TROPONIN, ED: TROPONIN I, POC: 0.02 ng/mL (ref 0.00–0.08)

## 2015-01-26 MED ORDER — PHENYLEPHRINE 40 MCG/ML (10ML) SYRINGE FOR IV PUSH (FOR BLOOD PRESSURE SUPPORT)
20.0000 ug | PREFILLED_SYRINGE | Freq: Once | INTRAVENOUS | Status: AC | PRN
  Filled 2015-01-26: qty 0.5

## 2015-01-26 MED ORDER — ETOMIDATE 2 MG/ML IV SOLN
INTRAVENOUS | Status: AC
  Filled 2015-01-26: qty 20

## 2015-01-26 MED ORDER — ETOMIDATE 2 MG/ML IV SOLN
0.3000 mg/kg | Freq: Once | INTRAVENOUS | Status: AC
  Administered 2015-01-26: 20 mg via INTRAVENOUS

## 2015-01-26 MED ORDER — PIPERACILLIN-TAZOBACTAM 3.375 G IVPB
3.3750 g | Freq: Three times a day (TID) | INTRAVENOUS | Status: DC
  Filled 2015-01-26: qty 50

## 2015-01-26 MED ORDER — SODIUM POLYSTYRENE SULFONATE 15 GM/60ML PO SUSP
30.0000 g | Freq: Once | ORAL | Status: DC
Start: 2015-01-26 — End: 2015-01-30

## 2015-01-26 MED ORDER — LIDOCAINE HCL (CARDIAC) 20 MG/ML IV SOLN
INTRAVENOUS | Status: AC
  Filled 2015-01-26: qty 5

## 2015-01-26 MED ORDER — SODIUM CHLORIDE 0.9 % IV BOLUS (SEPSIS)
500.0000 mL | INTRAVENOUS | Status: AC
  Administered 2015-01-26: 500 mL via INTRAVENOUS

## 2015-01-26 MED ORDER — SODIUM CHLORIDE 0.9 % IV SOLN
INTRAVENOUS | Status: DC
  Administered 2015-01-26 – 2015-01-29 (×7): via INTRAVENOUS
  Administered 2015-01-30: 125 mL/h via INTRAVENOUS
  Administered 2015-01-30: 09:00:00 via INTRAVENOUS
  Administered 2015-01-31: 125 mL/h via INTRAVENOUS
  Administered 2015-01-31: 09:00:00 via INTRAVENOUS
  Administered 2015-02-01: 125 mL/h via INTRAVENOUS

## 2015-01-26 MED ORDER — HYDROCORTISONE NA SUCCINATE PF 100 MG IJ SOLR
100.0000 mg | Freq: Once | INTRAMUSCULAR | Status: AC
  Administered 2015-01-26: 100 mg via INTRAVENOUS
  Filled 2015-01-26: qty 2

## 2015-01-26 MED ORDER — ROCURONIUM BROMIDE 50 MG/5ML IV SOLN
INTRAVENOUS | Status: AC
  Filled 2015-01-26: qty 2

## 2015-01-26 MED ORDER — SODIUM CHLORIDE 0.9 % IV SOLN: 250.0000 mL | INTRAVENOUS | Status: DC | PRN

## 2015-01-26 MED ORDER — VANCOMYCIN HCL IN DEXTROSE 750-5 MG/150ML-% IV SOLN: 750.0000 mg | INTRAVENOUS | Status: DC

## 2015-01-26 MED ORDER — PIPERACILLIN-TAZOBACTAM IN DEX 2-0.25 GM/50ML IV SOLN
2.2500 g | Freq: Three times a day (TID) | INTRAVENOUS | Status: DC
  Administered 2015-01-27 – 2015-01-28 (×5): 2.25 g via INTRAVENOUS
  Filled 2015-01-26 (×7): qty 50

## 2015-01-26 MED ORDER — SUCCINYLCHOLINE CHLORIDE 20 MG/ML IJ SOLN
INTRAMUSCULAR | Status: AC
  Filled 2015-01-26: qty 1

## 2015-01-26 MED ORDER — ROCURONIUM BROMIDE 50 MG/5ML IV SOLN
0.6000 mg/kg | Freq: Once | INTRAVENOUS | Status: AC
  Administered 2015-01-26: 45 mg via INTRAVENOUS

## 2015-01-26 MED ORDER — NALOXONE HCL 0.4 MG/ML IJ SOLN
0.4000 mg | Freq: Once | INTRAMUSCULAR | Status: AC
  Administered 2015-01-26: 0.4 mg via INTRAVENOUS
  Filled 2015-01-26: qty 1

## 2015-01-26 MED ORDER — LEVOTHYROXINE SODIUM 100 MCG IV SOLR
44.0000 ug | Freq: Every day | INTRAVENOUS | Status: DC
  Administered 2015-01-27 – 2015-01-30 (×4): 44 ug via INTRAVENOUS
  Filled 2015-01-26 (×5): qty 5

## 2015-01-26 MED ORDER — PHENYLEPHRINE 40 MCG/ML (10ML) SYRINGE FOR IV PUSH (FOR BLOOD PRESSURE SUPPORT)
PREFILLED_SYRINGE | INTRAVENOUS | Status: AC
Start: 2015-01-26 — End: 2015-01-27
  Filled 2015-01-26: qty 10

## 2015-01-26 MED ORDER — HEPARIN (PORCINE) IN NACL 100-0.45 UNIT/ML-% IJ SOLN
1000.0000 [IU]/h | INTRAMUSCULAR | Status: DC
  Administered 2015-01-27: 1000 [IU]/h via INTRAVENOUS
  Filled 2015-01-26 (×3): qty 250

## 2015-01-26 MED ORDER — VANCOMYCIN HCL IN DEXTROSE 1-5 GM/200ML-% IV SOLN
1000.0000 mg | INTRAVENOUS | Status: DC
  Filled 2015-01-26: qty 200

## 2015-01-26 MED ORDER — VANCOMYCIN HCL IN DEXTROSE 1-5 GM/200ML-% IV SOLN
1000.0000 mg | Freq: Once | INTRAVENOUS | Status: AC
  Administered 2015-01-26: 1000 mg via INTRAVENOUS
  Filled 2015-01-26: qty 200

## 2015-01-26 MED ORDER — SODIUM CHLORIDE 0.9 % IV BOLUS (SEPSIS)
1000.0000 mL | INTRAVENOUS | Status: AC
  Administered 2015-01-26 (×2): 1000 mL via INTRAVENOUS

## 2015-01-26 MED ORDER — VANCOMYCIN HCL 500 MG IV SOLR
500.0000 mg | Freq: Once | INTRAVENOUS | Status: AC
  Administered 2015-01-26: 500 mg via INTRAVENOUS
  Filled 2015-01-26: qty 500

## 2015-01-26 MED ORDER — PIPERACILLIN-TAZOBACTAM 3.375 G IVPB 30 MIN
3.3750 g | Freq: Once | INTRAVENOUS | Status: AC
  Administered 2015-01-26: 3.375 g via INTRAVENOUS
  Filled 2015-01-26: qty 50

## 2015-01-26 NOTE — ED Notes (Signed)
Per EMS: Pt not eating or drinking at home. Is hyoptensive 80/60. Denies any pain, is A/Ox4. Hx. Lung CA.

## 2015-01-26 NOTE — Progress Notes (Signed)
ANTICOAGULATION CONSULT NOTE - Initial Consult  Pharmacy Consult for Heparin Indication: atrial fibrillation  Allergies  Allergen Reactions  . Poison Ivy Extract [Extract Of Poison Ivy] Rash  . Sulfa Antibiotics Rash  . Sulfacetamide Sodium Rash       Heparin Weight: 74.8KG   Vital Signs: Temp: 93.3 F (34.1 C) (07/28 1941) Temp Source: Rectal (07/28 1941) BP: 85/49 mmHg (07/28 2045) Pulse Rate: 78 (07/28 2045)  Labs:  Recent Labs  01/24/15 1041 01/24/2015 1745  HGB 11.7 12.1  HCT 35.9 38.5  PLT 118* 122*  CREATININE 2.2* 2.83*    Estimated Creatinine Clearance: 16.7 mL/min (by C-G formula based on Cr of 2.83).   Medical History: Past Medical History  Diagnosis Date  . Ulcer 06-08-13    Left medial ankle  . Hypertension   . Thyroid disease     Hypo-Thyroidism  . Esophageal reflux   . COPD (chronic obstructive pulmonary disease)   . Peripheral vascular disease   . Hypothyroidism   . Insomnia     takes lorazepam  . Arthritis   . Dysrhythmia   . DVT (deep venous thrombosis) 2008  . Paroxysmal a-fib 2008    Anticoag stopped due to rectus sheath bleed  . PE (pulmonary thromboembolism) 2008  . Wears dentures   . Moderate to severe pulmonary hypertension   . Acute and chronic respiratory failure with hypoxia 06/14/2014    December 2015 2 L of oxygen continuously   . Lung cancer, main bronchus 05/03/2014    LUL, non small cell   . Adenocarcinoma, lung 04/15/2014    EBUS 04/20/14 >NON small cell carcinoma , adenocarcinoma ;LEFT >refer to Oncology     . Hemorrhagic shock 05/27/2014  . Spontaneous hemorrhage     Rectus sheath hematoma, while on anticoag    Medications:  Scheduled:  . levothyroxine  44 mcg Intravenous Daily   Infusions:  . sodium chloride    . sodium chloride    . [START ON 01/27/2015] piperacillin-tazobactam (ZOSYN)  IV    . [START ON 01/28/2015] vancomycin      Assessment: 81 yo female presenting to ED with hypotension. Has a  history of COPD and CHF, prior DVTs and stage IV lung cancer. Family reports patient has not been eating and drinking well over last several days  Patient taking Apixaban PTA, time of last dose unknown.    7/28 H&H 12.1/38.5 7/28 PLT 122 7/28 Heparin level ordered 7/28 APTT ordered  Goal of Therapy:  Heparin level 0.3-0.7 units/ml Monitor platelets by anticoagulation protocol: Yes   Plan:  - Order STAT Heparin level, APTT  - Heparin for A.fib w/OUT bolus - Initiate heparin at a rate of 1000 units per hour beginning 7/29 at 0400 (pending results of STAT heparin level/APTT) - Monitor daily heparin levels, CBC - Will continue to monitor for any signs/symptoms of bleeding and will follow up with heparin level in 8 hours after infusion begins   [employer], Pharm.D., BCPS 01/02/2015,10:20 PM

## 2015-01-26 NOTE — ED Provider Notes (Addendum)
CSN: [id number]     Arrival date & time 01/25/2015  1638 History   First MD Initiated Contact with Patient 01/11/2015 1641     Chief Complaint  Patient presents with  . Failure To Thrive  . Hypotension   HPI Patient presents to the emergency room for evaluation of hypotension. Patient has multiple medical problems as listed below. She does have COPD and congestive heart failure. Patient's also had prior DVTs. She also has a history of stage IV lung cancer. Patient has not been eating or drinking well according to the family over the last several days. Patient herself does not have much to say about what brought her here. Patient states that her daughter thinks that she is not eating well. Patient denies any complaints of pain. She denies any trouble with any shortness of breath. No abdominal pain she denies vomiting or diarrhea. Patient does admit that she is feeling weaker today and was not able to get up and walk. Past Medical History  Diagnosis Date  . Ulcer 06-08-13    Left medial ankle  . Hypertension   . Thyroid disease     Hypo-Thyroidism  . Esophageal reflux   . COPD (chronic obstructive pulmonary disease)   . Peripheral vascular disease   . Hypothyroidism   . Insomnia     takes lorazepam  . Arthritis   . Dysrhythmia   . DVT (deep venous thrombosis) 2008  . Paroxysmal a-fib 2008    Anticoag stopped due to rectus sheath bleed  . PE (pulmonary thromboembolism) 2008  . Wears dentures   . Moderate to severe pulmonary hypertension   . Acute and chronic respiratory failure with hypoxia 06/14/2014    December 2015 2 L of oxygen continuously   . Lung cancer, main bronchus 05/03/2014    LUL, non small cell   . Adenocarcinoma, lung 04/15/2014    EBUS 04/20/14 >NON small cell carcinoma , adenocarcinoma ;LEFT >refer to Oncology     . Hemorrhagic shock 05/27/2014  . Spontaneous hemorrhage     Rectus sheath hematoma, while on anticoag   Past Surgical History  Procedure Laterality Date   . Tonsillectomy    . Eye surgery Right Aug. 2014    Cataract  . Eye surgery Left Oct. 2014    Cataract  . Abdominal hysterectomy  1984    Total  . Video bronchoscopy with endobronchial navigation N/A 04/20/2014    Procedure: VIDEO BRONCHOSCOPY WITH ENDOBRONCHIAL NAVIGATION;  Surgeon: Collene Gobble, MD;  Location: [facility];  Service: Thoracic;  Laterality: N/A;  . Video bronchoscopy with endobronchial ultrasound N/A 04/20/2014    Procedure: VIDEO BRONCHOSCOPY WITH ENDOBRONCHIAL ULTRASOUND;  Surgeon: Collene Gobble, MD;  Location: [facility];  Service: Thoracic;  Laterality: N/A;   Family History  Problem Relation Age of Onset  . Heart disease Maternal Grandmother   . Cancer Brother     kidney  . Stroke Brother    History  Substance Use Topics  . Smoking status: Never Smoker   . Smokeless tobacco: Never Used  . Alcohol Use: No   OB History    No data available     Review of Systems  All other systems reviewed and are negative.     Allergies  Poison ivy extract; Sulfa antibiotics; and Sulfacetamide sodium  Home Medications   Prior to Admission medications   Medication Sig Start Date End Date Taking? Authorizing Provider  acetaminophen (TYLENOL) 500 MG tablet Take 500 mg by mouth every 6 (  six) hours as needed (pain).   Yes Historical Provider, MD  apixaban (ELIQUIS) 2.5 MG TABS tablet Take 1 tablet (2.5 mg total) by mouth 2 (two) times daily. 12/09/14  Yes Verlee Monte, MD  calcium-vitamin D (OSCAL WITH D) 500-200 MG-UNIT per tablet Take 1 tablet by mouth daily with breakfast.    Yes Historical Provider, MD  diltiazem (CARDIZEM CD) 180 MG 24 hr capsule Take 1 capsule (180 mg total) by mouth daily. 01/23/15  Yes Arnoldo Lenis, MD  folic acid (FOLVITE) 1 MG tablet Take 1 tablet (1 mg total) by mouth daily. 05/03/14  Yes Curt Bears, MD  furosemide (LASIX) 40 MG tablet Take 1 tablet (40 mg total) by mouth 2 (two) times daily. 01/23/15  Yes Arnoldo Lenis, MD   HYDROcodone-acetaminophen (NORCO/VICODIN) 5-325 MG per tablet Take 1 tablet by mouth every 6 (six) hours as needed for moderate pain. 09/06/14  Yes Lezlie Octave Black, NP  IRESSA 250 MG tablet TAKE 1 TABLET BY MOUTH ONCE DAILY 09/27/14  Yes Curt Bears, MD  levothyroxine (SYNTHROID, LEVOTHROID) 88 MCG tablet Take 88 mcg by mouth daily before breakfast.   Yes Historical Provider, MD  metolazone (ZAROXOLYN) 2.5 MG tablet Take 2.5 mg by mouth daily.  01/21/15  Yes Historical Provider, MD  metoprolol tartrate (LOPRESSOR) 25 MG tablet Take 0.5 tablets (12.5 mg total) by mouth 2 (two) times daily. 12/09/14 05/04/15 Yes Verlee Monte, MD  Omega-3 Fatty Acids (SALMON OIL-1000 PO) Take 1 tablet by mouth daily.    Yes Historical Provider, MD  potassium chloride SA (K-DUR,KLOR-CON) 20 MEQ tablet Take 1 tablet (20 mEq total) by mouth daily. 12/09/14  Yes Verlee Monte, MD  vitamin C (ASCORBIC ACID) 500 MG tablet Take 500 mg by mouth 2 (two) times daily.   Yes Historical Provider, MD   BP 89/55 mmHg  Pulse 88  Temp(Src) 93.3 F (34.1 C) (Rectal)  Resp 23  SpO2 95% Physical Exam  Constitutional: No distress.  HENT:  Right Ear: External ear normal.  Left Ear: External ear normal.  Chronically ill-appearing  Eyes: Conjunctivae are normal. Right eye exhibits no discharge. Left eye exhibits no discharge. No scleral icterus.  Neck: Neck supple. No tracheal deviation present.  Cardiovascular: Normal rate, regular rhythm and intact distal pulses.   Pulmonary/Chest: Effort normal and breath sounds normal. No stridor. No respiratory distress. She has no wheezes. She has no rales.  Abdominal: Soft. Bowel sounds are normal. She exhibits no distension. There is no tenderness. There is no rebound and no guarding.  Musculoskeletal: She exhibits edema. She exhibits no tenderness.  Erythema bilateral lower extremities below the knee, the patient does have edema, weak pulses bilateral dorsalis pedis  Neurological: She is  alert. No cranial nerve deficit (no facial droop, extraocular movements intact, no slurred speech) or sensory deficit. She exhibits normal muscle tone. She displays no seizure activity. Coordination normal.  Patient has generalized weakness but is able to grip my hands bilaterally, she does have leg weakness and is unable to stand  Skin: Skin is warm and dry. No rash noted.  Psychiatric: She has a normal mood and affect.  Nursing note and vitals reviewed.   ED Course  INTUBATION Date/Time: 01/21/2015 11:51 PM Performed by: Dorie Rank Authorized by: Dorie Rank Consent: The procedure was performed in an emergent situation. Verbal consent obtained. Risks and benefits: risks, benefits and alternatives were discussed Consent given by: patient Indications: respiratory failure Intubation method: video-assisted Patient status: paralyzed (RSI) Preoxygenation: nonrebreather mask  Sedatives: etomidate Paralytic: rocuronium Laryngoscope size: Mac 3 Tube size: 7.5 mm Tube type: cuffed Number of attempts: 1 Post-procedure assessment: chest rise and ETCO2 monitor Breath sounds: equal Cuff inflated: yes Patient tolerance: Patient tolerated the procedure well with no immediate complications Comments: I directly supervised Dr Alcide Evener during the procedure   (including critical care time) CRITICAL CARE Performed by: ZTIWP,YKD Total critical care time: 35 Critical care time was exclusive of separately billable procedures and treating other patients. Critical care was necessary to treat or prevent imminent or life-threatening deterioration. Critical care was time spent personally by me on the following activities: development of treatment plan with patient and/or surrogate as well as nursing, discussions with consultants, evaluation of patient's response to treatment, examination of patient, obtaining history from patient or surrogate, ordering and performing treatments and interventions, ordering and  review of laboratory studies, ordering and review of radiographic studies, pulse oximetry and re-evaluation of patient's condition.  Labs Review Labs Reviewed  COMPREHENSIVE METABOLIC PANEL - Abnormal; Notable for the following:    Potassium 5.3 (*)    Chloride 87 (*)    CO2 38 (*)    BUN 67 (*)    Creatinine, Ser 2.83 (*)    Albumin 3.3 (*)    Alkaline Phosphatase 144 (*)    Total Bilirubin 1.4 (*)    GFR calc non Af Amer 15 (*)    GFR calc Af Amer 17 (*)    All other components within normal limits  CBC WITH DIFFERENTIAL/PLATELET - Abnormal; Notable for the following:    WBC 3.7 (*)    RBC 3.52 (*)    MCV 109.4 (*)    MCH 34.4 (*)    RDW 17.8 (*)    Platelets 122 (*)    Lymphs Abs 0.6 (*)    Monocytes Relative 17 (*)    All other components within normal limits  I-STAT CG4 LACTIC ACID, ED - Abnormal; Notable for the following:    Lactic Acid, Venous 2.71 (*)    All other components within normal limits  CULTURE, BLOOD (ROUTINE X 2)  CULTURE, BLOOD (ROUTINE X 2)  URINE CULTURE  URINALYSIS, ROUTINE W REFLEX MICROSCOPIC (NOT AT [facility])  Randolm Idol, ED  I-STAT CG4 LACTIC ACID, ED    Imaging Review Dg Chest Port 1 View  12/30/2014   CLINICAL DATA:  Failure to thrive, hypotension  EXAM: PORTABLE CHEST - 1 VIEW  COMPARISON:  12/04/2014, 12/13/2014  FINDINGS: Marked cardiomegaly. Increased vascular and interstitial prominence concerning for early developing edema pattern compared to 12/04/2014. No significant effusion. No focal pneumonia or pneumothorax. Bibasilar atelectasis noted. Aortic atherosclerosis present.  IMPRESSION: Marked cardiomegaly with vascular and interstitial prominence concerning for early edema  Minor basilar atelectasis  Aortic atherosclerosis   Electronically Signed   By: Jerilynn Mages.  Shick M.D.   On: 01/14/2015 17:50    EKG Atrial fibrillation 80 Left posterior fascicular block Nonspecific ST-T wave changes Artifact Anterior infarct old No prior EKG for  comparison  MDM   Final diagnoses:  Hypotension, unspecified hypotension type  Hypothermia, initial encounter    Pt's blood pressure has improved but persistently borderline.  CXR without pna although she might have early edema.  Denies resp sx.  Lungs clear on my exam.  Pt's hypotension could be related to early sepsis.  Abx have been started.  Lactic acid level slightly elevated but similar to previous values.  No source yet , urine is pending.  Could also be multifactorial.  Poor po  intake associated with her CA and multiple comorbidities.  Will consult for admission.    Dorie Rank, MD 12/30/2014 2000  Reviewed records.  Pt is a full code.  Latest BP is back in the 80s.  She has received over 2 liters of fluid.  Will consult with PCCM.  May end up requiring pressors  Dorie Rank, MD 01/04/2015 2008  D/w Dr Janann Colonel.  Will add on cortisol level.  Give dose of hydrocortison IV.  Could have a component of adrenal insufficiency  Dorie Rank, MD 01/06/2015 2028  Pt was assessed by critical care.  She developed worsening hypercarbia.  I was asked to intubate.  Dorie Rank, MD 01/28/2015 2352

## 2015-01-26 NOTE — Progress Notes (Signed)
ANTIBIOTIC CONSULT NOTE - INITIAL  Pharmacy Consult for Vancomycin/Zosyn Indication: rule out sepsis  Allergies  Allergen Reactions  . Poison Ivy Extract [Extract Of Poison Ivy] Rash  . Sulfa Antibiotics Rash  . Sulfacetamide Sodium Rash    Patient Measurements:     Vital Signs:   Intake/Output from previous day:   Intake/Output from this shift:    Labs:  Recent Labs  01/24/15 1041  WBC 3.1*  HGB 11.7  PLT 118*  CREATININE 2.2*   Estimated Creatinine Clearance: 21.5 mL/min (by C-G formula based on Cr of 2.2). No results for input(s): VANCOTROUGH, VANCOPEAK, VANCORANDOM, GENTTROUGH, GENTPEAK, GENTRANDOM, TOBRATROUGH, TOBRAPEAK, TOBRARND, AMIKACINPEAK, AMIKACINTROU, AMIKACIN in the last 72 hours.   Microbiology: No results found for this or any previous visit (from the past 720 hour(s)).  Medical History: Past Medical History  Diagnosis Date  . Ulcer 06-08-13    Left medial ankle  . Hypertension   . Thyroid disease     Hypo-Thyroidism  . Esophageal reflux   . COPD (chronic obstructive pulmonary disease)   . Peripheral vascular disease   . Hypothyroidism   . Insomnia     takes lorazepam  . Arthritis   . Dysrhythmia   . DVT (deep venous thrombosis) 2008  . Paroxysmal a-fib 2008    Anticoag stopped due to rectus sheath bleed  . PE (pulmonary thromboembolism) 2008  . Wears dentures   . Moderate to severe pulmonary hypertension   . Acute and chronic respiratory failure with hypoxia 06/14/2014    December 2015 2 L of oxygen continuously   . Lung cancer, main bronchus 05/03/2014    LUL, non small cell   . Adenocarcinoma, lung 04/15/2014    EBUS 04/20/14 >NON small cell carcinoma , adenocarcinoma ;LEFT >refer to Oncology     . Hemorrhagic shock 05/27/2014  . Spontaneous hemorrhage     Rectus sheath hematoma, while on anticoag    Medications:  Scheduled:   Infusions:  . piperacillin-tazobactam    . sodium chloride     Followed by  . sodium chloride     . vancomycin     Assessment: 81 yo female presenting to  ED with hypotension.  Has a history of COPD and CHF, prior DVTs and stage IV lung cancer. Family reports patient has not been eating and drinking well over last several days.  Pharmacy consulted for vancomycin and zosyn to rule out sepsis  WBC 3.1 7/28 Blood cx x 2 - sent 7/28 Urine cx - sent   Goal of Therapy:  Vancomycin trough level 15-20 mcg/ml  Plan:  - Vancomycin 1000 mg x 1 dose as load - Give additional 500 mg x 1 dose to supplement loading dose - Vancomycin 1000 mg every 48 hours to begin 7/30 - Zosyn 2.25 gm every 8 hours - Closely follow Scr trend - Monitor WBC, Temp curve, culture data   Reatha Harps, Pharm.D., BCPS 01/14/2015,5:31 PM

## 2015-01-26 NOTE — Progress Notes (Signed)
Called CCM, spoke to RN to report critical lab results for ABG. Suggested re-collect and run in respiratory lab.

## 2015-01-26 NOTE — H&P (Signed)
PULMONARY / CRITICAL CARE MEDICINE   Name: Brittney Thomas MRN: [id number] DOB: Apr 19, 1933    ADMISSION DATE:  01/20/2015 CONSULTATION DATE:  01/27/2015  REFERRING MD :  EDP  CHIEF COMPLAINT:  Hypotension  INITIAL PRESENTATION:  81 y.o. F  brought to [facility] ED 7/28 with hypotension and AMS.  PCCM called for admission given persistent hypotension despite 2.5 L IVF.    STUDIES:  CXR 7/28 >>> CM with early edema.  Basilar ATX.  SIGNIFICANT EVENTS: 7/28 - admitted with hypotension / shock.   HISTORY OF PRESENT ILLNESS:  Pt is encephalopathic; therefore, this HPI is obtained from chart review. Brittney Thomas is an 81 y.o. F with PMH as outlined below.  She is followed by Dr. Lake Bells for chronic respiratory failure. She was brought to [facility] ED 01/25/2015 due to being lethargic and minimally arouseable by her daughter earlier that day.  Daughter states pt was in her USOH up until 1 day prior; however, after eating supper that night, she became nauseous. She gave her zofran which relieved nausea.  The following morning, pt was very somnolent initially and didn't each much of her breakfast.  As the day progressed, pt became more and more somnolent to the point she was lethargic and minimally responsive per daughter.  Daughter was concerned that pt may had taken 1 or 2 extra pain pills on morning of ED presentation.  She had a mechanical fall 1 week ago and had generalized aches following the fall.  Daughter states that pt has not had any fevers/chills/sweats, chest pain, SOB, N/V/D, abdominal pain, myalgias.   In ED, pt was found to be hypothermic to 93.45F, hypotensive with initial SBP 76 / 45 with lactate of 2.71.  She remained hypotensive with SBP in low 80's despite 2.5 L IVF.  PCCM was called for admission.  Of note, she had recent admission 11/29/14 through 12/09/14 for SOB thought due to aspiration and klebsiella UTI. Additionally, she has hx of stage 4 NSCLC (followed by Dr. Julien Nordmann) and is on daily PO  chemo.    PAST MEDICAL HISTORY :   has a past medical history of Ulcer (06-08-13); Hypertension; Thyroid disease; Esophageal reflux; COPD (chronic obstructive pulmonary disease); Peripheral vascular disease; Hypothyroidism; Insomnia; Arthritis; Dysrhythmia; DVT (deep venous thrombosis) (2008); Paroxysmal a-fib (2008); PE (pulmonary thromboembolism) (2008); Wears dentures; Moderate to severe pulmonary hypertension; Acute and chronic respiratory failure with hypoxia (06/14/2014); Lung cancer, main bronchus (05/03/2014); Adenocarcinoma, lung (04/15/2014); Hemorrhagic shock (05/27/2014); and Spontaneous hemorrhage.  has past surgical history that includes Tonsillectomy; Eye surgery (Right, Aug. 2014); Eye surgery (Left, Oct. 2014); Abdominal hysterectomy (1984); Video bronchoscopy with endobronchial navigation (N/A, 04/20/2014); and Video bronchoscopy with endobronchial ultrasound (N/A, 04/20/2014). Prior to Admission medications   Medication Sig Start Date End Date Taking? Authorizing Provider  acetaminophen (TYLENOL) 500 MG tablet Take 500 mg by mouth every 6 (six) hours as needed (pain).   Yes Historical Provider, MD  apixaban (ELIQUIS) 2.5 MG TABS tablet Take 1 tablet (2.5 mg total) by mouth 2 (two) times daily. 12/09/14  Yes Verlee Monte, MD  calcium-vitamin D (OSCAL WITH D) 500-200 MG-UNIT per tablet Take 1 tablet by mouth daily with breakfast.    Yes Historical Provider, MD  diltiazem (CARDIZEM CD) 180 MG 24 hr capsule Take 1 capsule (180 mg total) by mouth daily. 01/23/15  Yes Arnoldo Lenis, MD  folic acid (FOLVITE) 1 MG tablet Take 1 tablet (1 mg total) by mouth daily. 05/03/14  Yes Curt Bears, MD  furosemide (LASIX) 40 MG tablet Take 1 tablet (40 mg total) by mouth 2 (two) times daily. 01/23/15  Yes Arnoldo Lenis, MD  HYDROcodone-acetaminophen (NORCO/VICODIN) 5-325 MG per tablet Take 1 tablet by mouth every 6 (six) hours as needed for moderate pain. 09/06/14  Yes Lezlie Octave Black, NP  IRESSA  250 MG tablet TAKE 1 TABLET BY MOUTH ONCE DAILY 09/27/14  Yes Curt Bears, MD  levothyroxine (SYNTHROID, LEVOTHROID) 88 MCG tablet Take 88 mcg by mouth daily before breakfast.   Yes Historical Provider, MD  metolazone (ZAROXOLYN) 2.5 MG tablet Take 2.5 mg by mouth daily.  01/21/15  Yes Historical Provider, MD  metoprolol tartrate (LOPRESSOR) 25 MG tablet Take 0.5 tablets (12.5 mg total) by mouth 2 (two) times daily. 12/09/14 05/04/15 Yes Verlee Monte, MD  Omega-3 Fatty Acids (SALMON OIL-1000 PO) Take 1 tablet by mouth daily.    Yes Historical Provider, MD  potassium chloride SA (K-DUR,KLOR-CON) 20 MEQ tablet Take 1 tablet (20 mEq total) by mouth daily. 12/09/14  Yes Verlee Monte, MD  vitamin C (ASCORBIC ACID) 500 MG tablet Take 500 mg by mouth 2 (two) times daily.   Yes Historical Provider, MD   Allergies  Allergen Reactions  . Poison Ivy Extract [Extract Of Poison Ivy] Rash  . Sulfa Antibiotics Rash  . Sulfacetamide Sodium Rash    FAMILY HISTORY:  Family History  Problem Relation Age of Onset  . Heart disease Maternal Grandmother   . Cancer Brother     kidney  . Stroke Brother     SOCIAL HISTORY:  reports that she has never smoked. She has never used smokeless tobacco. She reports that she does not drink alcohol or use illicit drugs.  REVIEW OF SYSTEMS:  Unable to obtain as pt is encephalopathic.  SUBJECTIVE:   VITAL SIGNS: Temp:  [93.3 F (34.1 C)] 93.3 F (34.1 C) (07/28 1941) Pulse Rate:  [49-97] 78 (07/28 2045) Resp:  [12-23] 19 (07/28 2045) BP: (71-117)/(42-83) 85/49 mmHg (07/28 2045) SpO2:  [87 %-97 %] 97 % (07/28 2045) HEMODYNAMICS:   VENTILATOR SETTINGS:   INTAKE / OUTPUT: Intake/Output      07/28 0701 - 07/29 0700   Urine 20   Total Output 20   Net -20         PHYSICAL EXAMINATION: General: Chronically ill appearing female, in NAD. Neuro: Non-responsive to voice, opens eyes and mumbles to noxious stimuli. HEENT: [place]/AT. PERRL, arcus  senilis. Cardiovascular: IRIR, no M/R/G.  Lungs: Respirations shallow and unlabored.  Clear bilaterally. Abdomen: BS x 4, soft, NT/ND.  Musculoskeletal: BLE edema and erythema / cellulitis. Skin: Intact, warm, BLE cellulitis.  LABS:  CBC  Recent Labs Lab 01/24/15 1041 01/16/2015 1745  WBC 3.1* 3.7*  HGB 11.7 12.1  HCT 35.9 38.5  PLT 118* 122*   Coag's No results for input(s): APTT, INR in the last 168 hours. BMET  Recent Labs Lab 01/24/15 1041 01/05/2015 1745  NA 139 137  K 4.7 5.3*  CL  --  87*  CO2 40* 38*  BUN 60.1* 67*  CREATININE 2.2* 2.83*  GLUCOSE 69* 93   Electrolytes  Recent Labs Lab 01/24/15 1041 01/25/2015 1745  CALCIUM 9.3 9.6   Sepsis Markers  Recent Labs Lab 01/25/2015 1817 01/09/2015 2020  LATICACIDVEN 2.71* 2.07*   ABG No results for input(s): PHART, PCO2ART, PO2ART in the last 168 hours. Liver Enzymes  Recent Labs Lab 01/24/15 1041 01/10/2015 1745  AST 39* 39  ALT 18 21  ALKPHOS 159* 144*  BILITOT 1.13 1.4*  ALBUMIN 3.3* 3.3*   Cardiac Enzymes No results for input(s): TROPONINI, PROBNP in the last 168 hours. Glucose No results for input(s): GLUCAP in the last 168 hours.  Imaging Dg Chest Port 1 View  01/06/2015   CLINICAL DATA:  Failure to thrive, hypotension  EXAM: PORTABLE CHEST - 1 VIEW  COMPARISON:  12/04/2014, 12/13/2014  FINDINGS: Marked cardiomegaly. Increased vascular and interstitial prominence concerning for early developing edema pattern compared to 12/04/2014. No significant effusion. No focal pneumonia or pneumothorax. Bibasilar atelectasis noted. Aortic atherosclerosis present.  IMPRESSION: Marked cardiomegaly with vascular and interstitial prominence concerning for early edema  Minor basilar atelectasis  Aortic atherosclerosis   Electronically Signed   By: Jerilynn Mages.  Shick M.D.   On: 01/03/2015 17:50     ASSESSMENT / PLAN:  PULMONARY A: Acute on chronic hypoxic respiratory failure - I suspect that there is a component of  hypercarbia as well. At risk intubation. Stage IV NSCLC - on daily  PO chemo (followed by Dr. Julien Nordmann).   PAH (PASP 31 by echo 12/09/14). COPD by report - no PFT's on file. P:   Continue supplemental O2 as needed to maintain SpO2 > 92%. ABG STAT. May need BiPAP. If declines or unable to tolerate BiPAP, will require intubation. Would recommend palliative care consult. CXR in AM.  CARDIOVASCULAR CVL pending 7/28 >>> A:  Septic shock - due to urosepsis and exacerbated by hypovolemia. Hx HTN, PVD,  P:  Insert CVL. Vasopressor support as needed for goal MAP > 65. Goal CVP 8 - 12. Trend lactate. Repeat troponin. No role stress steroids as cortisol normal (> 60). Consider TTE. Hold outpatient diltiazem, furosemide, metolazone, metoprolol.  RENAL A:   Mild hyperkalemia. Metabolic alkalosis. AoCKD. P:   NS @ 75. Kayexalate 30g x 1. BMP in AM.  GASTROINTESTINAL A:   Esophageal reflux. Nutrition. P:   NPO for now.  HEMATOLOGIC / ONCOLOGIC A:   Hx stage IV NSLC - on daily  PO chemo (followed by Dr. Julien Nordmann). Pancytopenia - due to chemo. Hx DVT / PE - on chronic apixaban.. VTE Prophylaxis. P:  Hold outpatient daily iressa while NPO. Day team to please notify oncology of admission. Hold daily apixaban. SCD's / Heparin gtt. Coags. CBC in AM.  INFECTIOUS A:   Septic shock - due to urosepsis and LE cellulitis. P:   BCx2 7/28 > UCx 7/28 > Sputum Cx 7/28 > Abx: Vanc, start date 7/28, day 1/x. Abx: Zosyn, start date 7/28, day 1/x. PCT likely misleading given immunocompromised state.  ENDOCRINE A:   Hypothyroidism. P:   Continue synthroid, change to IV at 1/2 outpatient PO dose. Check TSH.  NEUROLOGIC A:   Acute metabolic encephalopathy - ? Related to extra doses of opiates vs hypercarbia.  Also exacerbated by persistent hypotension / shock. P:   Narcan x 1. Check ABG, ammonia. Hold outpatient norco.   Family updated: Daugher, [name] updated over phone.   She requests aggressive measures and insistent on full code.  Interdisciplinary Family Meeting v Palliative Care Meeting:  Due by: 02/09/2015.  CC time: 40 minutes.   [name], [facility] Pager: [phone]  or [phone] 01/14/2015, 10:24 PM

## 2015-01-27 ENCOUNTER — Inpatient Hospital Stay (HOSPITAL_COMMUNITY): Payer: Medicare Other

## 2015-01-27 ENCOUNTER — Other Ambulatory Visit: Payer: Self-pay | Admitting: Internal Medicine

## 2015-01-27 DIAGNOSIS — Z452 Encounter for adjustment and management of vascular access device: Secondary | ICD-10-CM

## 2015-01-27 DIAGNOSIS — I9589 Other hypotension: Secondary | ICD-10-CM

## 2015-01-27 DIAGNOSIS — I959 Hypotension, unspecified: Secondary | ICD-10-CM

## 2015-01-27 DIAGNOSIS — J9601 Acute respiratory failure with hypoxia: Secondary | ICD-10-CM | POA: Insufficient documentation

## 2015-01-27 DIAGNOSIS — E875 Hyperkalemia: Secondary | ICD-10-CM | POA: Insufficient documentation

## 2015-01-27 DIAGNOSIS — N390 Urinary tract infection, site not specified: Secondary | ICD-10-CM | POA: Insufficient documentation

## 2015-01-27 DIAGNOSIS — J9602 Acute respiratory failure with hypercapnia: Secondary | ICD-10-CM

## 2015-01-27 LAB — LIPASE, BLOOD: Lipase: 19 U/L — ABNORMAL LOW (ref 22–51)

## 2015-01-27 LAB — AMMONIA: AMMONIA: 53 umol/L — AB (ref 9–35)

## 2015-01-27 LAB — APTT
APTT: 37 s (ref 24–37)
APTT: 83 s — AB (ref 24–37)
aPTT: >200 seconds (ref 24–37)

## 2015-01-27 LAB — CBC
HEMATOCRIT: 34.7 % — AB (ref 36.0–46.0)
HEMOGLOBIN: 11.2 g/dL — AB (ref 12.0–15.0)
MCH: 34.4 pg — ABNORMAL HIGH (ref 26.0–34.0)
MCHC: 32.3 g/dL (ref 30.0–36.0)
MCV: 106.4 fL — ABNORMAL HIGH (ref 78.0–100.0)
Platelets: 107 10*3/uL — ABNORMAL LOW (ref 150–400)
RBC: 3.26 MIL/uL — ABNORMAL LOW (ref 3.87–5.11)
RDW: 17.3 % — AB (ref 11.5–15.5)
WBC: 3.7 10*3/uL — AB (ref 4.0–10.5)

## 2015-01-27 LAB — BASIC METABOLIC PANEL
ANION GAP: 14 (ref 5–15)
BUN: 64 mg/dL — AB (ref 6–20)
CHLORIDE: 92 mmol/L — AB (ref 101–111)
CO2: 31 mmol/L (ref 22–32)
CREATININE: 2.95 mg/dL — AB (ref 0.44–1.00)
Calcium: 9.2 mg/dL (ref 8.9–10.3)
GFR calc Af Amer: 16 mL/min — ABNORMAL LOW (ref >60)
GFR calc non Af Amer: 14 mL/min — ABNORMAL LOW (ref >60)
Glucose, Bld: 94 mg/dL (ref 65–99)
Potassium: 4.9 mmol/L (ref 3.5–5.1)
SODIUM: 137 mmol/L (ref 135–145)

## 2015-01-27 LAB — LACTATE DEHYDROGENASE: LDH: 177 U/L (ref 98–192)

## 2015-01-27 LAB — POCT I-STAT 3, ART BLOOD GAS (G3+)
Acid-Base Excess: 13 mmol/L — ABNORMAL HIGH (ref 0.0–2.0)
Bicarbonate: 37.3 mEq/L — ABNORMAL HIGH (ref 20.0–24.0)
O2 Saturation: 100 %
Patient temperature: 98.7
TCO2: 39 mmol/L (ref 0–100)
pCO2 arterial: 45.5 mmHg — ABNORMAL HIGH (ref 35.0–45.0)
pH, Arterial: 7.522 — ABNORMAL HIGH (ref 7.350–7.450)
pO2, Arterial: 313 mmHg — ABNORMAL HIGH (ref 80.0–100.0)

## 2015-01-27 LAB — HEPARIN LEVEL (UNFRACTIONATED)

## 2015-01-27 LAB — AMYLASE: AMYLASE: 89 U/L (ref 28–100)

## 2015-01-27 LAB — LACTIC ACID, PLASMA
Lactic Acid, Venous: 3.9 mmol/L (ref 0.5–2.0)
Lactic Acid, Venous: 2 mmol/L (ref 0.5–2.0)

## 2015-01-27 LAB — GLUCOSE, CAPILLARY: Glucose-Capillary: 96 mg/dL (ref 65–99)

## 2015-01-27 LAB — PHOSPHORUS: PHOSPHORUS: 4.7 mg/dL — AB (ref 2.5–4.6)

## 2015-01-27 LAB — TSH: TSH: 1.48 u[IU]/mL (ref 0.350–4.500)

## 2015-01-27 LAB — MRSA PCR SCREENING: MRSA by PCR: NEGATIVE

## 2015-01-27 LAB — MAGNESIUM: MAGNESIUM: 2 mg/dL (ref 1.7–2.4)

## 2015-01-27 MED ORDER — FENTANYL CITRATE (PF) 100 MCG/2ML IJ SOLN
INTRAMUSCULAR | Status: AC
  Administered 2015-01-27: 100 ug
  Filled 2015-01-27: qty 2

## 2015-01-27 MED ORDER — MIDAZOLAM HCL 2 MG/2ML IJ SOLN
1.0000 mg | INTRAMUSCULAR | Status: DC | PRN
  Administered 2015-01-28 – 2015-01-29 (×3): 1 mg via INTRAVENOUS
  Filled 2015-01-27 (×4): qty 2

## 2015-01-27 MED ORDER — IOHEXOL 300 MG/ML  SOLN
25.0000 mL | INTRAMUSCULAR | Status: AC
  Administered 2015-01-27 (×2): 25 mL via ORAL

## 2015-01-27 MED ORDER — PANTOPRAZOLE SODIUM 40 MG IV SOLR
40.0000 mg | Freq: Every day | INTRAVENOUS | Status: DC
  Administered 2015-01-27 – 2015-01-31 (×5): 40 mg via INTRAVENOUS
  Filled 2015-01-27 (×6): qty 40

## 2015-01-27 MED ORDER — HEPARIN (PORCINE) IN NACL 100-0.45 UNIT/ML-% IJ SOLN
850.0000 [IU]/h | INTRAMUSCULAR | Status: DC
  Administered 2015-01-28: 750 [IU]/h via INTRAVENOUS
  Administered 2015-01-29: 850 [IU]/h via INTRAVENOUS
  Filled 2015-01-27 (×4): qty 250

## 2015-01-27 MED ORDER — MIDAZOLAM HCL 2 MG/2ML IJ SOLN
1.0000 mg | INTRAMUSCULAR | Status: DC | PRN
  Administered 2015-01-27: 1 mg via INTRAVENOUS
  Filled 2015-01-27 (×2): qty 2

## 2015-01-27 MED ORDER — CETYLPYRIDINIUM CHLORIDE 0.05 % MT LIQD
7.0000 mL | Freq: Four times a day (QID) | OROMUCOSAL | Status: DC
  Administered 2015-01-27 – 2015-02-01 (×21): 7 mL via OROMUCOSAL

## 2015-01-27 MED ORDER — FENTANYL BOLUS VIA INFUSION
25.0000 ug | INTRAVENOUS | Status: DC | PRN
  Administered 2015-01-27 – 2015-01-29 (×3): 25 ug via INTRAVENOUS
  Filled 2015-01-27: qty 25

## 2015-01-27 MED ORDER — FENTANYL CITRATE (PF) 100 MCG/2ML IJ SOLN
100.0000 ug | Freq: Once | INTRAMUSCULAR | Status: AC
Start: 2015-01-27 — End: 2015-01-27
  Administered 2015-01-27: 100 ug via INTRAVENOUS

## 2015-01-27 MED ORDER — FENTANYL CITRATE (PF) 100 MCG/2ML IJ SOLN
50.0000 ug | Freq: Once | INTRAMUSCULAR | Status: AC
  Administered 2015-01-27: 50 ug via INTRAVENOUS
  Filled 2015-01-27: qty 2

## 2015-01-27 MED ORDER — SODIUM CHLORIDE 0.9 % IV BOLUS (SEPSIS)
1000.0000 mL | Freq: Once | INTRAVENOUS | Status: AC
  Administered 2015-01-27: 1000 mL via INTRAVENOUS

## 2015-01-27 MED ORDER — CHLORHEXIDINE GLUCONATE 0.12 % MT SOLN
15.0000 mL | Freq: Two times a day (BID) | OROMUCOSAL | Status: DC
  Administered 2015-01-27 – 2015-02-01 (×12): 15 mL via OROMUCOSAL
  Filled 2015-01-27 (×9): qty 15

## 2015-01-27 MED ORDER — MIDAZOLAM HCL 2 MG/2ML IJ SOLN
1.0000 mg | Freq: Once | INTRAMUSCULAR | Status: AC
  Administered 2015-01-27: 1 mg via INTRAVENOUS

## 2015-01-27 MED ORDER — SODIUM CHLORIDE 0.9 % IV SOLN
25.0000 ug/h | INTRAVENOUS | Status: DC
  Administered 2015-01-27: 250 ug/h via INTRAVENOUS
  Administered 2015-01-27: 50 ug/h via INTRAVENOUS
  Administered 2015-01-28: 250 ug/h via INTRAVENOUS
  Administered 2015-01-28: 50 ug/h via INTRAVENOUS
  Administered 2015-01-29: 100 ug/h via INTRAVENOUS
  Administered 2015-01-31: 50 ug/h via INTRAVENOUS
  Filled 2015-01-27 (×6): qty 50

## 2015-01-27 MED ORDER — NOREPINEPHRINE BITARTRATE 1 MG/ML IV SOLN
2.0000 ug/min | INTRAVENOUS | Status: DC
  Filled 2015-01-27: qty 16

## 2015-01-27 NOTE — Progress Notes (Signed)
ANTICOAGULATION CONSULT NOTE - Follow Up Consult  Pharmacy Consult for heparin Indication: atrial fibrillation   Labs:  Recent Labs  01/24/2015 1745 01/27/15 0223 01/27/15 0225 01/27/15 0443 01/27/15 1215 01/27/15 2050  HGB 12.1  --   --  11.2*  --   --   HCT 38.5  --   --  34.7*  --   --   PLT 122*  --   --  107*  --   --   APTT  --   --  37  --  83* >200*  HEPARINUNFRC  --  >2.20*  --   --   --   --   CREATININE 2.83*  --   --  2.95*  --   --      Assessment: 80yo female now supratherapeutic on heparin after one PTT at goal, apparently accumulating; lab drawn correctly from central line, heparin running peripherally.  Goal of Therapy:  Heparin level 66-102 units/ml   Plan:  Will hold heparin gtt x1hr then resume heparin at lower rate of 750 units/hr and check PTT in [place], PharmD, BCPS  01/27/2015,11:56 PM

## 2015-01-27 NOTE — Progress Notes (Signed)
Initial Nutrition Assessment  DOCUMENTATION CODES:   Severe malnutrition in context of chronic illness  INTERVENTION:   If pt remains intubated and abdomen cleared recommend:  Vital AF 1.2 @ 40 ml/hr 30 ml Prostat TID  Provides: 1452 kcal, 117 grams protein, and 778 ml H2O.  NUTRITION DIAGNOSIS:   Malnutrition related to chronic illness as evidenced by severe depletion of body fat, severe depletion of muscle mass.   GOAL:   Patient will meet greater than or equal to 90% of their needs   MONITOR:   Skin, I & O's, Vent status, Labs  REASON FOR ASSESSMENT:   Ventilator    ASSESSMENT:   she has hx of stage 4 NSCLC (followed by Dr. Julien Nordmann) and is on daily PO chemo. Lives with daughter admitted for lethargy and eventually intubated.   Patient is currently intubated on ventilator support MV: 7.9 L/min Temp (24hrs), Avg:96.2 F (35.7 C), Min:93.3 F (34.1 C), Max:97.6 F (36.4 C)  Concern for abdominal process, CT pending. NPO for now.   OG tube, tip in stomach to suction Labs reviewed: BUN/Cr elevated Per daughter pt has lost weight but she feels that this is fluid related. Had gotten down to 150's. She reports that pt has a good appetite. Daughter cooks breakfast and dinner and they have sandwiches for lunch.  Nutrition-Focused physical exam completed. Findings are severe fat depletion, severe muscle depletion, and moderate edema.    Diet Order:  Diet NPO time specified  Skin:  Wound (see comment) (stage II buttocks)  Last BM:  PTA  Height:   Ht Readings from Last 1 Encounters:  01/23/15 '5\' 7"'$  (1.702 m)    Weight:   Wt Readings from Last 1 Encounters:  01/27/15 177 lb 4 oz (80.4 kg)    Ideal Body Weight:  61.3 kg  BMI:  Body mass index is 27.75 kg/(m^2).  Estimated Nutritional Needs:   Kcal:  1413  Protein:  110-120 grams  Fluid:  > 1.5 L/day  EDUCATION NEEDS:   No education needs identified at this time  Hernandez, [place],  [place] Pager [phone] After Hours Pager

## 2015-01-27 NOTE — Procedures (Signed)
Central Venous Catheter Insertion Procedure Note Brittney Thomas [id number] [birth year]  Procedure: Insertion of Central Venous Catheter Indications: Assessment of intravascular volume, Drug and/or fluid administration and Frequent blood sampling  Procedure Details Consent: Risks of procedure as well as the alternatives and risks of each were explained to the (patient/caregiver).  Consent for procedure obtained. Time Out: Verified patient identification, verified procedure, site/side was marked, verified correct patient position, special equipment/implants available, medications/allergies/relevent history reviewed, required imaging and test results available.  Performed  Maximum sterile technique was used including antiseptics, cap, gloves, gown, hand hygiene, mask and sheet. Skin prep: Chlorhexidine; local anesthetic administered A antimicrobial bonded/coated triple lumen catheter was placed in the left internal jugular vein using the Seldinger technique.  Evaluation Blood flow good Complications: No apparent complications Patient did tolerate procedure well. Chest X-ray ordered to verify placement.  CXR: pending.  Procedure performed under direct ultrasound guidance for real time vessel cannulation.      [name], [place] [facility] Pager: [phone]  or [phone] 01/27/2015, 1:21 AM

## 2015-01-27 NOTE — Progress Notes (Signed)
PCCM Interval Progress Note  HPI:  ABG obtained and revealed significant hypercarbic respiratory failure with pH 7.2 and pCO2 > 100.  Pt still somnolent on exam and difficult to arouse.  Concern for ability to protect airway with BiPAP.  Interventions:  Intubate now.  Increase MV and recheck ABG in 1 hour.  Fentanyl gtt with PRN midazolam for sedation - titrate to goal RASS 0 to -1.  Multiple phone calls made to son [name] and daughter [name] - not able to reach either over the phone.   [name], [place] [facility] Pager: [phone]  or [phone] 01/27/2015, 12:48 AM

## 2015-01-27 NOTE — Progress Notes (Signed)
CRITICAL VALUE ALERT  Critical value received:  Lactic Acid 3.9  Date of notification:  01/27/15  Time of notification:  0330  Critical value read back:Yes.    Nurse who received alert:  [place]  MD notified (1st page):  Dr. Jimmy Footman  Time of first page:  [phone]  MD notified (2nd page):  Time of second page:  Responding MD:  Dr. Jimmy Footman  Time MD responded:  [phone]

## 2015-01-27 NOTE — Progress Notes (Signed)
PULMONARY / CRITICAL CARE MEDICINE   Name: Brittney Thomas MRN: [id number] DOB: 1932-10-07    ADMISSION DATE:  01/25/2015 CONSULTATION DATE:  01/27/2015  REFERRING MD :  EDP  CHIEF COMPLAINT:  Hypotension  INITIAL PRESENTATION:  81 y.o. F  brought to [facility] ED 7/28 with hypotension and AMS.  PCCM called for admission given persistent hypotension despite 2.5 L IVF. Patient intubated, central line placed  BRIEF PATIENT DESCRIPTION: Brittney Thomas is an 81 y.o. F with NSCLD on po chemo daily, with PMH of HTN, thyroid dz, COPD, VCD, PE 2008, pulm HTN. She is followed by Dr. Lake Bells for chronic respiratory failure. She was brought to [facility] ED 01/05/2015 due to being lethargic and minimally arouseable by her daughter earlier that day.  Daughter states pt was in her USOH up until 1 day prior; however, after eating supper that night, she became nauseous. She gave her zofran which relieved nausea.  The following morning, pt was very somnolent initially and didn't each much of her breakfast.  As the day progressed, pt became more and more somnolent to the point she was lethargic and minimally responsive per daughter.  Daughter was concerned that pt may had taken 1 or 2 extra pain pills on morning of ED presentation.  She had a mechanical fall 1 week ago and had generalized aches following the fall.  Daughter states that pt has not had any fevers/chills/sweats, chest pain, SOB, N/V/D, abdominal pain, myalgias.   In ED, pt was found to be hypothermic to 93.11F, hypotensive with initial SBP 76 / 45 with lactate of 2.71.  She remained hypotensive with SBP in low 80's despite 2.5 L IVF.  PCCM was called for admission.  Of note, she had recent admission 11/29/14 through 12/09/14 for SOB thought due to aspiration and klebsiella UTI. Additionally, she has hx of stage 4 NSCLC (followed by Dr. Julien Nordmann) and is on daily PO chemo.  STUDIES:  CXR 7/28 >>> CM with early edema.  Basilar ATX.  SIGNIFICANT EVENTS: 7/28 - admitted with  hypotension / shock.  LINES/DRAINS: 7/28 ETT >> 7/28 L IJ Central line >>  SUBJECTIVE:  Unresponsive, sedated. No acute events overnight.  VITAL SIGNS: Temp:  [93.3 F (34.1 C)-97.3 F (36.3 C)] 97.3 F (36.3 C) (07/29 0732) Pulse Rate:  [28-106] 95 (07/29 0715) Resp:  [12-26] 23 (07/29 0715) BP: (71-136)/(42-87) 121/77 mmHg (07/29 0715) SpO2:  [80 %-100 %] 95 % (07/29 0715) FiO2 (%):  [50 %-100 %] 60 % (07/29 0400) Weight:  [80.4 kg (177 lb 4 oz)] 80.4 kg (177 lb 4 oz) (07/29 0419) HEMODYNAMICS: CVP:  [22 mmHg-24 mmHg] 24 mmHg VENTILATOR SETTINGS: Vent Mode:  [-] PRVC FiO2 (%):  [50 %-100 %] 60 % Set Rate:  [18 bmp-26 bmp] 26 bmp Vt Set:  [500 mL] 500 mL PEEP:  [5 cmH20] 5 cmH20 Plateau Pressure:  [23 cmH20-25 cmH20] 23 cmH20 INTAKE / OUTPUT: Intake/Output      07/28 0701 - 07/29 0700 07/29 0701 - 07/30 0700   I.V. (mL/kg) 557.1 (6.9)    IV Piggyback 50    Total Intake(mL/kg) 607.1 (7.6)    Urine (mL/kg/hr) 170    Total Output 170     Net +437.1            PHYSICAL EXAMINATION: General: Chronically ill appearing female, in NAD. Neuro: Non-responsive to voice, localized to pain when fentanyl reduced HEENT: [place]/AT. Cardiovascular: IRIR, no M/R/G.  Lungs: Respirations shallow and unlabored.  Clear bilaterally. Abdomen:  abdomen distended central predominant, tympanic, soft on deep palpation, absent BS Musculoskeletal: BLE edema and erythema / venous stasis changes, vs cellulitis Skin: Intact, warm  LABS:  CBC  Recent Labs Lab 01/24/15 1041 01/18/2015 1745 01/27/15 0443  WBC 3.1* 3.7* 3.7*  HGB 11.7 12.1 11.2*  HCT 35.9 38.5 34.7*  PLT 118* 122* 107*   Coag's  Recent Labs Lab 01/27/15 0225  APTT 37   BMET  Recent Labs Lab 01/24/15 1041 01/17/2015 1745 01/27/15 0443  NA 139 137 137  K 4.7 5.3* 4.9  CL  --  87* 92*  CO2 40* 38* 31  BUN 60.1* 67* 64*  CREATININE 2.2* 2.83* 2.95*  GLUCOSE 69* 93 94   Electrolytes  Recent Labs Lab  01/24/15 1041 01/12/2015 1745 01/27/15 0443  CALCIUM 9.3 9.6 9.2  MG  --   --  2.0  PHOS  --   --  4.7*   Sepsis Markers  Recent Labs Lab 01/01/2015 1817 01/13/2015 2020 01/27/15 0225  LATICACIDVEN 2.71* 2.07* 3.9*   ABG  Recent Labs Lab 01/27/15 0140  PHART 7.522*  PCO2ART 45.5*  PO2ART 313.0*   Liver Enzymes  Recent Labs Lab 01/24/15 1041 01/22/2015 1745  AST 39* 39  ALT 18 21  ALKPHOS 159* 144*  BILITOT 1.13 1.4*  ALBUMIN 3.3* 3.3*   Cardiac Enzymes No results for input(s): TROPONINI, PROBNP in the last 168 hours. Glucose  Recent Labs Lab 01/27/15 0048  GLUCAP 96    Imaging Dg Chest Port 1 View  01/27/2015   CLINICAL DATA:  Central line placement  EXAM: PORTABLE CHEST - 1 VIEW  COMPARISON:  01/23/2015  FINDINGS: There is a new left jugular central line with tip in the SVC. Endotracheal tube is 3.3 cm above the carina. Nasogastric tube extends into the stomach.  There is no pneumothorax.  The lungs are grossly clear.  IMPRESSION: New left jugular central line. No pneumothorax. Satisfactory ET and NG tube positions.   Electronically Signed   By: Andreas Newport M.D.   On: 01/27/2015 02:26   Dg Chest Port 1 View  01/27/2015   CLINICAL DATA:  Post intubation, evaluate endotracheal tube.  EXAM: PORTABLE CHEST - 1 VIEW  COMPARISON:  Chest radiograph January 26, 2015 at 1720 hours  FINDINGS: Endotracheal tube tip projects 2.4 cm above the carina. Cardiac silhouette is moderate to severely enlarged, similar to prior examination. Mild central pulmonary vascular congestion. Elevated LEFT hemidiaphragm without pleural effusion. No pneumothorax. Old RIGHT mid clavicle fracture. Soft tissue planes are nonsuspicious.  IMPRESSION: Endotracheal tube tip projects 2.4 cm above the carina.  Stable cardiomegaly mild pulmonary vascular congestion.   Electronically Signed   By: Elon Alas M.D.   On: 01/27/2015 00:41   Dg Chest Port 1 View  01/14/2015   CLINICAL DATA:  Failure to  thrive, hypotension  EXAM: PORTABLE CHEST - 1 VIEW  COMPARISON:  12/04/2014, 12/13/2014  FINDINGS: Marked cardiomegaly. Increased vascular and interstitial prominence concerning for early developing edema pattern compared to 12/04/2014. No significant effusion. No focal pneumonia or pneumothorax. Bibasilar atelectasis noted. Aortic atherosclerosis present.  IMPRESSION: Marked cardiomegaly with vascular and interstitial prominence concerning for early edema  Minor basilar atelectasis  Aortic atherosclerosis   Electronically Signed   By: Jerilynn Mages.  Shick M.D.   On: 01/18/2015 17:50   Dg Abd Portable 1v  01/27/2015   CLINICAL DATA:  81 year old female undergoing nasogastric tube placement  EXAM: PORTABLE ABDOMEN - 1 VIEW  COMPARISON:  Prior CT scan  chest/abdomen/ pelvis 12/13/2014  FINDINGS: The nasogastric tube projects over the gastric fundus. The bowel gas pattern is not obstructed. No large free air on this single supine radiograph. Atherosclerotic calcifications noted in the abdominal aorta. Non retrievable IVC filter. Multilevel degenerative disc disease in the thoracolumbar spine with degenerative rotary levoconvex scoliosis.  IMPRESSION: 1. The nasogastric tube overlies the gastric fundus. 2. Aortic atherosclerosis. 3. Non retrievable IVC filter in place.   Electronically Signed   By: Jacqulynn Cadet M.D.   On: 01/27/2015 07:46     ASSESSMENT / PLAN:  PULMONARY A: Acute on chronic hypoxic respiratory failure requiring mechanical ventilation Stage IV NSCLC - on daily  PO chemo (followed by Dr. Julien Nordmann).   PAH (PASP 31 by echo 12/09/14). COPD by report - no PFT's on file. P:   ABG following - adjust vent to ABG, reduce rate  Would recommend palliative care consult. CXR this am, ett wnl May be able to sbt today   CARDIOVASCULAR L IJ central 7/28 >>> A:  Septic shock - source unclear see ID Hx HTN, PVD, IVC filter Negative troponin P:  Vasopressor support as needed for goal MAP > 60 cvp trend  more important with baseline PA pressures, , LA rising, bolus - repeat LA Trend lactate concerning, re bolus No role stress steroids as cortisol normal (> 60). Hold outpatient diltiazem, furosemide, metolazone, metoprolol.  RENAL A:   Mild hyperkalemia. Metabolic alkalosis. AoCKD - Cr 2.95, baseline 1.8 Hypovolemia / ATN P:   NS increase bolus Discontinue Kayexelate - K normal now, continue to follow BMP in AM.  GASTROINTESTINAL A:   Distended abdomen with hx of nausea, concern for abdominal process Esophageal reflux. Nutrition. P:   Order stat CT LDH, repeat LA, amy, lip NPO for now. OGT to int suction  HEMATOLOGIC / ONCOLOGIC A:   Hx stage IV NSLC - on daily  PO chemo (followed by Dr. Julien Nordmann). Pancytopenia - due to chemo. Hx DVT / PE - on chronic apixaban VTE Prophylaxis. P:  Hold outpatient daily iressa while NP Hold daily apixaban. SCD's / Heparin gtt Coags. CBC in AM.  INFECTIOUS A:   Septic shock - due to sepsis of urinary source vs. Abdominal source P:   BCx2 7/28 > UCx 7/28 > Sputum Cx 7/28 >  Abx: Vanc, start date 7/28>>> Abx: Zosyn, start date 7/28>>> PCT likely misleading given immunocompromised state- not gonna use  Image renals now for hydro   ENDOCRINE A:   Hypothyroidism, TSH 1.48 7/29 P:   Continue synthroid IV at 1/2 outpatient PO dose.  NEUROLOGIC A:   Acute metabolic encephalopathy - Related to extra doses of opiates vs hypercarbia.  Also exacerbated by persistent hypotension / shock. Ammonia 53 P:   Check ABG Hold outpatient norco Fentanyl with WUA  Palliative care consideration  Will call Oncology to update   Family updated: Daugher, [name] updated over phone on 7/28.  She requests aggressive measures and insistent on full code.  Interdisciplinary Family Meeting v Palliative Care Meeting:  Due by: 02/14/15.  Ccm time 35 min   Lavon Paganini. Titus Mould, MD, [place] Pgr: [place] [facility]

## 2015-01-27 NOTE — Progress Notes (Signed)
ANTICOAGULATION CONSULT NOTE - Follow Up Consult  Pharmacy Consult for Heparin Indication: atrial fibrillation  Allergies  Allergen Reactions  . Poison Ivy Extract [Extract Of Poison Ivy] Rash  . Sulfa Antibiotics Rash  . Sulfacetamide Sodium Rash     Weight: 177 lb 4 oz (80.4 kg) Heparin Weight: 74.8KG   Vital Signs: Temp: 97.6 F (36.4 C) (07/29 1145) Temp Source: Oral (07/29 1145) BP: 104/79 mmHg (07/29 1300) Pulse Rate: 96 (07/29 1300)  Labs:  Recent Labs  01/17/2015 1745 01/27/15 0223 01/27/15 0225 01/27/15 0443 01/27/15 1215  HGB 12.1  --   --  11.2*  --   HCT 38.5  --   --  34.7*  --   PLT 122*  --   --  107*  --   APTT  --   --  37  --  83*  HEPARINUNFRC  --  >2.20*  --   --   --   CREATININE 2.83*  --   --  2.95*  --     Estimated Creatinine Clearance: 16.6 mL/min (by C-G formula based on Cr of 2.95).   Medical History: Past Medical History  Diagnosis Date  . Ulcer 06-08-13    Left medial ankle  . Hypertension   . Thyroid disease     Hypo-Thyroidism  . Esophageal reflux   . COPD (chronic obstructive pulmonary disease)   . Peripheral vascular disease   . Hypothyroidism   . Insomnia     takes lorazepam  . Arthritis   . Dysrhythmia   . DVT (deep venous thrombosis) 2008  . Paroxysmal a-fib 2008    Anticoag stopped due to rectus sheath bleed  . PE (pulmonary thromboembolism) 2008  . Wears dentures   . Moderate to severe pulmonary hypertension   . Acute and chronic respiratory failure with hypoxia 06/14/2014    December 2015 2 L of oxygen continuously   . Lung cancer, main bronchus 05/03/2014    LUL, non small cell   . Adenocarcinoma, lung 04/15/2014    EBUS 04/20/14 >NON small cell carcinoma , adenocarcinoma ;LEFT >refer to Oncology     . Hemorrhagic shock 05/27/2014  . Spontaneous hemorrhage     Rectus sheath hematoma, while on anticoag    Medications:  Scheduled:  . antiseptic oral rinse  7 mL Mouth Rinse QID  . chlorhexidine  15 mL  Mouth Rinse BID  . levothyroxine  44 mcg Intravenous Daily  . pantoprazole (PROTONIX) IV  40 mg Intravenous Daily  . piperacillin-tazobactam (ZOSYN)  IV  2.25 g Intravenous Q8H  . sodium polystyrene  30 g Oral Once  . [START ON 01/28/2015] vancomycin  1,000 mg Intravenous Q48H   Infusions:  . sodium chloride 125 mL/hr at 01/27/15 1135  . fentaNYL infusion INTRAVENOUS 250 mcg/hr (01/27/15 1358)  . heparin 1,000 Units/hr (01/27/15 0700)  . norepinephrine (LEVOPHED) Adult infusion      Assessment: 81 yo female presented to ED with hypotension. Has a history of COPD and CHF, prior DVTs and stage IV lung cancer. Patient was on Apixaban prior to admission for Afib. Currently on IV heparin at 1000 units/hr. BL HL this AM was supra-therapeutic since apixaban has not been full cleared. APTT this afternoon is therapeutic at 83s. No s/s of bleeding noted.   Goal of Therapy:  Heparin level 0.3-0.7 units/ml Monitor platelets by anticoagulation protocol: Yes  APTT 66-102s   Plan:  - Continue heparin infusion at 1000 units/hr - F/u confirmatory APTT in 8 hours.  Will hold off on ordering another anti-Xa level till next AM since Eliquis has probably not cleared yet due to poor renal fx - Monitor daily heparin levels, CBC - Will continue to monitor for any signs/symptoms of bleeding and will follow up with heparin level in 8 hours after infusion begins   Albertina Parr, PharmD., BCPS Clinical Pharmacist Pager [phone]

## 2015-01-27 NOTE — Progress Notes (Signed)
RT Note: Pt transported to CT & back to unit with no complications. RT will continue to monitor. 

## 2015-01-27 NOTE — Clinical Documentation Improvement (Signed)
  Please specify the stage of Chronic Kidney Disease if known.  CKD Stage I- GFR > or = 90 CKD Stage II- GFR = 60-80 CKD Stage III- GFR = 30-59 CKD Stage IV- GFR= 15-29 CKD Stage V- GFR < 15 ESRD Other Unable to suspect or determine  01/27/15 Progress Note:  "AoCKD- Cr 2.95, baseline 1.8" 01/27/15 GFR = 14  Thank you!  Pryor Montes, BSN, RN [place] HIM/Clinical Documentation Specialist [email or web address] [phone]

## 2015-01-28 ENCOUNTER — Inpatient Hospital Stay (HOSPITAL_COMMUNITY): Payer: Medicare Other

## 2015-01-28 DIAGNOSIS — G9340 Encephalopathy, unspecified: Secondary | ICD-10-CM

## 2015-01-28 DIAGNOSIS — C3400 Malignant neoplasm of unspecified main bronchus: Secondary | ICD-10-CM

## 2015-01-28 LAB — URINE CULTURE: Culture: >=100000

## 2015-01-28 LAB — CBC
HCT: 30.6 % — ABNORMAL LOW (ref 36.0–46.0)
Hemoglobin: 10.9 g/dL — ABNORMAL LOW (ref 12.0–15.0)
MCH: 34.6 pg — AB (ref 26.0–34.0)
MCHC: 35.6 g/dL (ref 30.0–36.0)
MCV: 97.1 fL (ref 78.0–100.0)
Platelets: 111 10*3/uL — ABNORMAL LOW (ref 150–400)
RBC: 3.15 MIL/uL — AB (ref 3.87–5.11)
RDW: 17.4 % — AB (ref 11.5–15.5)
WBC: 4.6 10*3/uL (ref 4.0–10.5)

## 2015-01-28 LAB — LACTIC ACID, PLASMA: LACTIC ACID, VENOUS: 1.3 mmol/L (ref 0.5–2.0)

## 2015-01-28 LAB — APTT
aPTT: 68 seconds — ABNORMAL HIGH (ref 24–37)
aPTT: 60 seconds — ABNORMAL HIGH (ref 24–37)

## 2015-01-28 LAB — HEPARIN LEVEL (UNFRACTIONATED): Heparin Unfractionated: >2.2 IU/mL — ABNORMAL HIGH (ref 0.30–0.70)

## 2015-01-28 MED ORDER — FUROSEMIDE 10 MG/ML IJ SOLN
40.0000 mg | Freq: Once | INTRAMUSCULAR | Status: AC
  Administered 2015-01-28: 40 mg via INTRAVENOUS
  Filled 2015-01-28: qty 4

## 2015-01-28 MED ORDER — CEFTRIAXONE SODIUM IN DEXTROSE 20 MG/ML IV SOLN
1.0000 g | INTRAVENOUS | Status: DC
  Administered 2015-01-28 – 2015-01-31 (×4): 1 g via INTRAVENOUS
  Filled 2015-01-28 (×5): qty 50

## 2015-01-28 NOTE — Progress Notes (Signed)
ANTICOAGULATION CONSULT NOTE - Follow Up Consult  Pharmacy Consult for Heparin Indication: atrial fibrillation  Allergies  Allergen Reactions  . Poison Ivy Extract [Extract Of Poison Ivy] Rash  . Sulfa Antibiotics Rash  . Sulfacetamide Sodium Rash     Weight: 186 lb 4.6 oz (84.5 kg) Heparin Weight: 74.8KG   Vital Signs: Temp: 98.9 F (37.2 C) (07/30 1542) Temp Source: Oral (07/30 1542) BP: 96/66 mmHg (07/30 1800) Pulse Rate: 112 (07/30 1800)  Labs:  Recent Labs  01/22/2015 1745 01/27/15 0223  01/27/15 0443  01/27/15 2050 01/28/15 0854 01/28/15 0855 01/28/15 1024 01/28/15 1800  HGB 12.1  --   --  11.2*  --   --   --   --  10.9*  --   HCT 38.5  --   --  34.7*  --   --   --   --  30.6*  --   PLT 122*  --   --  107*  --   --   --   --  111*  --   APTT  --   --   < >  --   < > >200* 68*  --   --  60*  HEPARINUNFRC  --  >2.20*  --   --   --   --   --  >2.20*  --   --   CREATININE 2.83*  --   --  2.95*  --   --   --   --   --   --   < > = values in this interval not displayed.  Estimated Creatinine Clearance: 17 mL/min (by C-G formula based on Cr of 2.95).  Medications:  Infusions:  . sodium chloride 125 mL/hr at 01/28/15 1342  . fentaNYL infusion INTRAVENOUS 100 mcg/hr (01/28/15 1500)  . heparin 750 Units/hr (01/28/15 0859)  . norepinephrine (LEVOPHED) Adult infusion     Assessment: 81 yo female presented to ED with hypotension. Has a history of COPD and CHF, prior DVTs and stage IV lung cancer. Patient was on Apixaban prior to admission for Afib but now transitioned to heparin IV. APTT is slightly low at 60. No bleeding or other problems noted.   Goal of Therapy:  Heparin level 0.3-0.7 units/ml Monitor platelets by anticoagulation protocol: Yes  APTT 66-102s   Plan:  - Increase heparin gtt to 850 units/hr - Check an 8 hour aPTT - Monitor daily heparin levels, CBC, aPTT  Salome Arnt, PharmD, BCPS Pager # [phone] 01/28/2015 6:41 PM

## 2015-01-28 NOTE — Progress Notes (Signed)
ANTIBIOTIC CONSULT NOTE - INITIAL  Pharmacy Consult for Ceftriaxone  Indication: UTI  Allergies  Allergen Reactions  . Poison Ivy Extract [Extract Of Poison Ivy] Rash  . Sulfa Antibiotics Rash  . Sulfacetamide Sodium Rash    Patient Measurements: Weight: 186 lb 4.6 oz (84.5 kg) Adjusted Body Weight: n/a  Vital Signs: Temp: 98.7 F (37.1 C) (07/30 1147) Temp Source: Oral (07/30 1147) BP: 96/62 mmHg (07/30 1158) Pulse Rate: 112 (07/30 1158) Intake/Output from previous day: 07/29 0701 - 07/30 0700 In: 3763.3 [I.V.:3493.3; NG/GT:120; IV Piggyback:150] Out: 1180 [Urine:980; Emesis/NG output:200] Intake/Output from this shift: Total I/O In: 647.5 [I.V.:597.5; IV Piggyback:50] Out: 230 [Urine:230]  Labs:  Recent Labs  01/28/2015 1745 01/27/15 0443 01/28/15 1024  WBC 3.7* 3.7* 4.6  HGB 12.1 11.2* 10.9*  PLT 122* 107* 111*  CREATININE 2.83* 2.95*  --    Estimated Creatinine Clearance: 17 mL/min (by C-G formula based on Cr of 2.95). No results for input(s): VANCOTROUGH, VANCOPEAK, VANCORANDOM, GENTTROUGH, GENTPEAK, GENTRANDOM, TOBRATROUGH, TOBRAPEAK, TOBRARND, AMIKACINPEAK, AMIKACINTROU, AMIKACIN in the last 72 hours.   Microbiology: Recent Results (from the past 720 hour(s))  Blood Culture (routine x 2)     Status: None (Preliminary result)   Collection Time: 01/24/2015  5:30 PM  Result Value Ref Range Status   Specimen Description BLOOD RIGHT ARM  Final   Special Requests BOTTLES DRAWN AEROBIC ONLY 5ML  Final   Culture NO GROWTH 2 DAYS  Final   Report Status PENDING  Incomplete  Blood Culture (routine x 2)     Status: None (Preliminary result)   Collection Time: 01/11/2015  5:40 PM  Result Value Ref Range Status   Specimen Description BLOOD RIGHT HAND  Final   Special Requests BOTTLES DRAWN AEROBIC ONLY 5ML  Final   Culture NO GROWTH 2 DAYS  Final   Report Status PENDING  Incomplete  Urine culture     Status: None   Collection Time: 01/02/2015  7:42 PM  Result Value  Ref Range Status   Specimen Description URINE, CATHETERIZED  Final   Special Requests NONE  Final   Culture >=100,000 COLONIES/mL KLEBSIELLA PNEUMONIAE  Final   Report Status 01/28/2015 FINAL  Final   Organism ID, Bacteria KLEBSIELLA PNEUMONIAE  Final      Susceptibility   Klebsiella pneumoniae - MIC*    AMPICILLIN >=32 RESISTANT Resistant     CEFAZOLIN <=4 SENSITIVE Sensitive     CEFTRIAXONE <=1 SENSITIVE Sensitive     CIPROFLOXACIN <=0.25 SENSITIVE Sensitive     GENTAMICIN <=1 SENSITIVE Sensitive     IMIPENEM <=0.25 SENSITIVE Sensitive     NITROFURANTOIN 32 SENSITIVE Sensitive     TRIMETH/SULFA <=20 SENSITIVE Sensitive     AMPICILLIN/SULBACTAM 4 SENSITIVE Sensitive     PIP/TAZO <=4 SENSITIVE Sensitive     * >=100,000 COLONIES/mL KLEBSIELLA PNEUMONIAE  MRSA PCR Screening     Status: None   Collection Time: 01/27/15 12:20 AM  Result Value Ref Range Status   MRSA by PCR NEGATIVE NEGATIVE Final    Comment:        The GeneXpert MRSA Assay (FDA approved for NASAL specimens only), is one component of a comprehensive MRSA colonization surveillance program. It is not intended to diagnose MRSA infection nor to guide or monitor treatment for MRSA infections.   Culture, blood (routine x 2)     Status: None (Preliminary result)   Collection Time: 01/27/15 10:55 AM  Result Value Ref Range Status   Specimen Description BLOOD RIGHT HAND  Final   Special Requests BOTTLES DRAWN AEROBIC ONLY 5CC  Final   Culture NO GROWTH 1 DAY  Final   Report Status PENDING  Incomplete  Culture, blood (routine x 2)     Status: None (Preliminary result)   Collection Time: 01/27/15 11:05 AM  Result Value Ref Range Status   Specimen Description BLOOD LEFT HAND  Final   Special Requests IN PEDIATRIC BOTTLE 1CC  Final   Culture NO GROWTH 1 DAY  Final   Report Status PENDING  Incomplete    Medical History: Past Medical History  Diagnosis Date  . Ulcer 06-08-13    Left medial ankle  . Hypertension   .  Thyroid disease     Hypo-Thyroidism  . Esophageal reflux   . COPD (chronic obstructive pulmonary disease)   . Peripheral vascular disease   . Hypothyroidism   . Insomnia     takes lorazepam  . Arthritis   . Dysrhythmia   . DVT (deep venous thrombosis) 2008  . Paroxysmal a-fib 2008    Anticoag stopped due to rectus sheath bleed  . PE (pulmonary thromboembolism) 2008  . Wears dentures   . Moderate to severe pulmonary hypertension   . Acute and chronic respiratory failure with hypoxia 06/14/2014    December 2015 2 L of oxygen continuously   . Lung cancer, main bronchus 05/03/2014    LUL, non small cell   . Adenocarcinoma, lung 04/15/2014    EBUS 04/20/14 >NON small cell carcinoma , adenocarcinoma ;LEFT >refer to Oncology     . Hemorrhagic shock 05/27/2014  . Spontaneous hemorrhage     Rectus sheath hematoma, while on anticoag    Assessment: 81 yo female with septic shock due to UTI, now resolving.  Culture growing Klebsiella.  Pharmacy asked to narrow antibiotics to ceftriaxone.  7/28 Blood cx x 2 > pending 7/28 Urine cx > Klebsiella - R ampicillin - otherwise sens  7/28 Vanc>>7/30 7/28 Zosyn>>7/30 7/30 CTX >   Goal of Therapy:  Resolution of infection  Plan:  1. Ceftriaxone 1g IV q 24 hrs. 2. Monitor for length of therapy.  Uvaldo Rising, BCPS  Clinical Pharmacist Pager [phone]  01/28/2015 12:43 PM

## 2015-01-28 NOTE — Progress Notes (Signed)
PULMONARY / CRITICAL CARE MEDICINE   Name: Brittney Thomas MRN: [id number] DOB: Apr 03, 1933    ADMISSION DATE:  01/12/2015 CONSULTATION DATE:  01/28/2015  REFERRING MD :  EDP  CHIEF COMPLAINT:  Hypotension  INITIAL PRESENTATION:  81 y.o. F  brought to [facility] ED 7/28 with hypotension and AMS.  PCCM called for admission given persistent hypotension despite 2.5 L IVF. Patient intubated, central line placed  BRIEF PATIENT DESCRIPTION: Brittney Thomas is an 81 y.o. F with NSCLD on po chemo daily, with PMH of HTN, thyroid dz, COPD, VCD, PE 2008, pulm HTN. She is followed by Dr. Lake Bells for chronic respiratory failure. She was brought to [facility] ED 01/10/2015 due to being lethargic and minimally arouseable by her daughter earlier that day.  Daughter states pt was in her USOH up until 1 day prior; however, after eating supper that night, she became nauseous. She gave her zofran which relieved nausea.  The following morning, pt was very somnolent initially and didn't each much of her breakfast.  As the day progressed, pt became more and more somnolent to the point she was lethargic and minimally responsive per daughter.  Daughter was concerned that pt may had taken 1 or 2 extra pain pills on morning of ED presentation.  She had a mechanical fall 1 week ago and had generalized aches following the fall.  Daughter states that pt has not had any fevers/chills/sweats, chest pain, SOB, N/V/D, abdominal pain, myalgias.   In ED, pt was found to be hypothermic to 93.57F, hypotensive with initial SBP 76 / 45 with lactate of 2.71.  She remained hypotensive with SBP in low 80's despite 2.5 L IVF.  PCCM was called for admission.  Of note, she had recent admission 11/29/14 through 12/09/14 for SOB thought due to aspiration and klebsiella UTI. Additionally, she has hx of stage 4 NSCLC (followed by Dr. Julien Nordmann) and is on daily PO chemo.  STUDIES:  CXR 7/28 >>> CM with early edema.  Basilar ATX.  SIGNIFICANT EVENTS: 7/28 - admitted with  hypotension / shock.  LINES/DRAINS: 7/28 ETT >> 7/28 L IJ Central line >>  SUBJECTIVE:  Unresponsive, sedated. No acute events overnight.  VITAL SIGNS: Temp:  [96.9 F (36.1 C)-98.9 F (37.2 C)] 98.8 F (37.1 C) (07/30 0738) Pulse Rate:  [66-115] 115 (07/30 0900) Resp:  [14-18] 18 (07/30 0900) BP: (62-146)/(46-127) 109/76 mmHg (07/30 0900) SpO2:  [95 %-100 %] 100 % (07/30 0900) FiO2 (%):  [40 %] 40 % (07/30 0731) Weight:  [186 lb 4.6 oz (84.5 kg)] 186 lb 4.6 oz (84.5 kg) (07/30 0424) HEMODYNAMICS: CVP:  [12 mmHg-31 mmHg] 20 mmHg VENTILATOR SETTINGS: Vent Mode:  [-] PRVC FiO2 (%):  [40 %] 40 % Set Rate:  [16 bmp] 16 bmp Vt Set:  [500 mL] 500 mL PEEP:  [5 cmH20] 5 cmH20 Plateau Pressure:  [19 cmH20-27 cmH20] 21 cmH20 INTAKE / OUTPUT: Intake/Output      07/29 0701 - 07/30 0700 07/30 0701 - 07/31 0700   I.V. (mL/kg) 3493.3 (41.3) 440 (5.2)   NG/GT 120    IV Piggyback 150    Total Intake(mL/kg) 3763.3 (44.5) 440 (5.2)   Urine (mL/kg/hr) 980 (0.5) 230 (0.9)   Emesis/NG output 200 (0.1)    Total Output 1180 230   Net +2583.3 +210          PHYSICAL EXAMINATION: General: Chronically ill appearing female, in NAD. Neuro: Non-responsive to voice, localized to pain when fentanyl reduced HEENT: [place]/AT. Cardiovascular: IRIR, no  M/R/G.  Lungs: Respirations shallow and unlabored.  Decreased bilaterally. Abdomen: abdomen distended central predominant, tympanic, soft on deep palpation, absent BS Musculoskeletal: BLE edema and erythema / venous stasis changes, vs cellulitis Skin: Intact, warm  LABS:  CBC  Recent Labs Lab 01/24/15 1041 01/08/2015 1745 01/27/15 0443  WBC 3.1* 3.7* 3.7*  HGB 11.7 12.1 11.2*  HCT 35.9 38.5 34.7*  PLT 118* 122* 107*   Coag's  Recent Labs Lab 01/27/15 1215 01/27/15 2050 01/28/15 0854  APTT 83* >200* 68*   BMET  Recent Labs Lab 01/24/15 1041 01/09/2015 1745 01/27/15 0443  NA 139 137 137  K 4.7 5.3* 4.9  CL  --  87* 92*  CO2 40*  38* 31  BUN 60.1* 67* 64*  CREATININE 2.2* 2.83* 2.95*  GLUCOSE 69* 93 94   Electrolytes  Recent Labs Lab 01/24/15 1041 01/19/2015 1745 01/27/15 0443  CALCIUM 9.3 9.6 9.2  MG  --   --  2.0  PHOS  --   --  4.7*   Sepsis Markers  Recent Labs Lab 12/31/2014 2020 01/27/15 0225 01/27/15 1115  LATICACIDVEN 2.07* 3.9* 2.0   ABG  Recent Labs Lab 01/27/15 0140  PHART 7.522*  PCO2ART 45.5*  PO2ART 313.0*   Liver Enzymes  Recent Labs Lab 01/24/15 1041 01/01/2015 1745  AST 39* 39  ALT 18 21  ALKPHOS 159* 144*  BILITOT 1.13 1.4*  ALBUMIN 3.3* 3.3*   Cardiac Enzymes No results for input(s): TROPONINI, PROBNP in the last 168 hours. Glucose  Recent Labs Lab 01/27/15 0048  GLUCAP 96    Imaging Ct Abdomen Pelvis Wo Contrast  01/27/2015   CLINICAL DATA:  Abdominal bloating.  Septic shock.  EXAM: CT ABDOMEN AND PELVIS WITHOUT CONTRAST  TECHNIQUE: Multidetector CT imaging of the abdomen and pelvis was performed following the standard protocol without IV contrast.  COMPARISON:  12/13/2014  FINDINGS: Lower chest: Small bilateral pleural effusions. These are increased from previous exam. The heart size appears enlarged. There is aortic atherosclerosis noted.  Hepatobiliary: Similar appearance of low-attenuation structure within the medial segment of left lobe measuring 2.1 cm. Stone identified within the gallbladder. No biliary dilatation.  Pancreas: Negative.  Spleen: Negative.  Adrenals/Urinary Tract: The normal appearance of the adrenal glands. The kidneys are both unremarkable. The urinary bladder is collapsed around a Foley catheter.  Stomach/Bowel: Nasogastric tube is identified within the stomach. The small bowel loops have a normal course and caliber without evidence for bowel obstruction. Enteric contrast material is identified within the colon up to the level of the rectum.  Vascular/Lymphatic: Aortic atherosclerosis identified. There is a filter within the IVC. Prominent  periaortic lymph node is unchanged measuring 9 mm. Large varix is identified within the left side of pelvis as before, image 73/series 2. Left pelvic side wall fluid collection measure 5.6 x 1.5 cm, image 76/ series 2. This is decreased from 6.9 x 2.3 cm previously.  Reproductive: Previous hysterectomy.  No adnexal mass.  Other: There is moderate ascites within the abdomen and pelvis which appears increased in volume from previous exam. There is also diffuse body wall edema compatible with anasarca, progressed from the previous exam.  Musculoskeletal: Similar appearance of sclerotic lesion within right iliac wing, image 55/series 2 peer there is a mild scoliosis deformity. Multi level degenerative disc disease identified. Stable sclerosis within the L4 vertebra, image 63/series 6.  IMPRESSION: 1. Interval progression of fluid overload state. There is been increase in bilateral pleural effusions, body wall edema and abdominal  ascites. 2. Cirrhosis 3. Aortic atherosclerotic disease. 4. Decrease in volume of left pelvic sidewall extra peritoneal fluid collection.   Electronically Signed   By: Kerby Moors M.D.   On: 01/27/2015 15:59   Portable Chest Xray  01/28/2015   CLINICAL DATA:  Respiratory failure  EXAM: PORTABLE CHEST - 1 VIEW  COMPARISON:  01/27/2015  FINDINGS: Patient is rotated.  Lungs are essentially clear. No frank interstitial edema or focal consolidation. No pleural effusion or pneumothorax.  Endotracheal tube terminates 3.5 cm above the carina.  Cardiomegaly.  Right IJ venous catheter terminates in the mid SVC. Enteric tube courses into the gastric body.  IMPRESSION: Endotracheal tube terminates 2.5 cm above the carina.  Additional support apparatus as above.  No evidence of acute cardiopulmonary disease.   Electronically Signed   By: Julian Hy M.D.   On: 01/28/2015 07:16     ASSESSMENT / PLAN:  PULMONARY A: Acute on chronic hypoxic respiratory failure requiring mechanical  ventilation Stage IV NSCLC - on daily  PO chemo (followed by Dr. Julien Nordmann).   PAH (PASP 31 by echo 12/09/14). COPD by report - no PFT's on file. P:   ABG following - adjust vent to ABG, reduce rate  Would recommend palliative care consult. Not currently weanable   CARDIOVASCULAR L IJ central 7/28 >>> A:  Septic shock - source unclear see ID Hx HTN, PVD, IVC filter Negative troponin P:  Vasopressor support as needed for goal MAP > 60, currently off pressors cvp 22 but with elevated pa pressures ? Correlation.  Trend lactate  No role stress steroids as cortisol normal (> 60). Hold outpatient diltiazem, furosemide, metolazone, metoprolol.  RENAL  Recent Labs Lab 01/24/15 1041 01/06/2015 1745 01/27/15 0443  K 4.7 5.3* 4.9   Lab Results  Component Value Date   CREATININE 2.95* 01/27/2015   CREATININE 2.83* 01/23/2015   CREATININE 2.2* 01/24/2015   CREATININE 2.0* 01/17/2015   CREATININE 2.0* 01/11/2015   CREATININE 1.49* 12/09/2014     A:   Mild hyperkalemia. resolved Metabolic alkalosis. AoCKD - Cr 2.95, baseline 1.8 Hypovolemia / ATN P:   NS increase bolus Discontinue Kayexelate - K normal now, continue to follow BMP daily  GASTROINTESTINAL A:   Distended abdomen with hx of nausea, concern for abdominal process Esophageal reflux. Nutrition. P:    CT abd with ascites  Follw hepatic panel as needed NPO for now. OGT to int suction  HEMATOLOGIC / ONCOLOGIC A:   Hx stage IV NSLC - on daily  PO chemo (followed by Dr. Julien Nordmann). Pancytopenia - due to chemo. Hx DVT / PE - on chronic apixaban VTE Prophylaxis. P:  Hold outpatient daily iressa while NP Hold daily apixaban. SCD's / Heparin gtt Coags. CBC   INFECTIOUS A:   Septic shock - due to sepsis of urinary source most likely P:   BCx2 7/28 > UCx 7/28 >Kleb pneum ss pip/tazo and ceftriaxone  Sputum Cx 7/28 >  Abx: Vanc, start date 7/28>>> Abx: Zosyn, start date 7/28>>> PCT likely misleading given  immunocompromised state- not going to use  Image renals now for hydro   ENDOCRINE A:   Hypothyroidism, TSH 1.48 7/29 P:   Continue synthroid IV at 1/2 outpatient PO dose.  NEUROLOGIC A:   Acute metabolic encephalopathy - Related to extra doses of opiates vs hypercarbia.  Also exacerbated by persistent hypotension / shock. Ammonia 53 P:   Check ABG, OK 7/29 Hold outpatient norco Fentanyl with WUA  Palliative care consideration  Oncology  updated   Family updated: Daugher, [name] updated over phone on 7/28.  She requests aggressive measures and insistent on full code.  Interdisciplinary Family Meeting v Palliative Care Meeting:  Due by: Feb 20, 2015.  Richardson Landry Minor ACNP Maryanna Shape PCCM Pager [phone] till 3 pm If no answer page [phone] 01/28/2015, 10:09 AM

## 2015-01-28 NOTE — Progress Notes (Signed)
ANTICOAGULATION CONSULT NOTE - Follow Up Consult  Pharmacy Consult for Heparin Indication: atrial fibrillation  Allergies  Allergen Reactions  . Poison Ivy Extract [Extract Of Poison Ivy] Rash  . Sulfa Antibiotics Rash  . Sulfacetamide Sodium Rash     Weight: 186 lb 4.6 oz (84.5 kg) Heparin Weight: 74.8KG   Vital Signs: Temp: 98.8 F (37.1 C) (07/30 0738) Temp Source: Oral (07/30 0738) BP: 104/67 mmHg (07/30 1000) Pulse Rate: 104 (07/30 1012)  Labs:  Recent Labs  01/25/2015 1745 01/27/15 0223  01/27/15 0443 01/27/15 1215 01/27/15 2050 01/28/15 0854 01/28/15 1024  HGB 12.1  --   --  11.2*  --   --   --  10.9*  HCT 38.5  --   --  34.7*  --   --   --  30.6*  PLT 122*  --   --  107*  --   --   --  111*  APTT  --   --   < >  --  83* >200* 68*  --   HEPARINUNFRC  --  >2.20*  --   --   --   --   --   --   CREATININE 2.83*  --   --  2.95*  --   --   --   --   < > = values in this interval not displayed.  Estimated Creatinine Clearance: 17 mL/min (by C-G formula based on Cr of 2.95).   Medical History: Past Medical History  Diagnosis Date  . Ulcer 06-08-13    Left medial ankle  . Hypertension   . Thyroid disease     Hypo-Thyroidism  . Esophageal reflux   . COPD (chronic obstructive pulmonary disease)   . Peripheral vascular disease   . Hypothyroidism   . Insomnia     takes lorazepam  . Arthritis   . Dysrhythmia   . DVT (deep venous thrombosis) 2008  . Paroxysmal a-fib 2008    Anticoag stopped due to rectus sheath bleed  . PE (pulmonary thromboembolism) 2008  . Wears dentures   . Moderate to severe pulmonary hypertension   . Acute and chronic respiratory failure with hypoxia 06/14/2014    December 2015 2 L of oxygen continuously   . Lung cancer, main bronchus 05/03/2014    LUL, non small cell   . Adenocarcinoma, lung 04/15/2014    EBUS 04/20/14 >NON small cell carcinoma , adenocarcinoma ;LEFT >refer to Oncology     . Hemorrhagic shock 05/27/2014  .  Spontaneous hemorrhage     Rectus sheath hematoma, while on anticoag    Medications:  Scheduled:  . antiseptic oral rinse  7 mL Mouth Rinse QID  . chlorhexidine  15 mL Mouth Rinse BID  . levothyroxine  44 mcg Intravenous Daily  . pantoprazole (PROTONIX) IV  40 mg Intravenous Daily  . piperacillin-tazobactam (ZOSYN)  IV  2.25 g Intravenous Q8H  . sodium polystyrene  30 g Oral Once  . vancomycin  1,000 mg Intravenous Q48H   Infusions:  . sodium chloride 125 mL/hr at 01/28/15 0644  . fentaNYL infusion INTRAVENOUS 250 mcg/hr (01/28/15 0110)  . heparin 750 Units/hr (01/28/15 0859)  . norepinephrine (LEVOPHED) Adult infusion      Assessment: 81 yo female presented to ED with hypotension. Has a history of COPD and CHF, prior DVTs and stage IV lung cancer. Patient was on Apixaban prior to admission for Afib.  PTT last night was above goal, heparin gtt rate reduced and now  PTT down to 68 (at goal).  Heparin level remains elevated given effect of recent apixaban.  Goal of Therapy:  Heparin level 0.3-0.7 units/ml Monitor platelets by anticoagulation protocol: Yes  APTT 66-102s   Plan:  - Continue heparin infusion at 750 units/hr - F/u confirmatory APTT in 8 hours. Will hold off on ordering another anti-Xa level till next AM since Eliquis has probably not cleared yet due to poor renal fx. - Monitor daily heparin levels, CBC - Will continue to monitor for any signs/symptoms of bleeding and will follow up with heparin level in 8 hours after infusion begins  Uvaldo Rising, BCPS  Clinical Pharmacist Pager [phone]  01/28/2015 11:11 AM

## 2015-01-29 ENCOUNTER — Inpatient Hospital Stay (HOSPITAL_COMMUNITY): Payer: Medicare Other

## 2015-01-29 LAB — CBC
HEMATOCRIT: 30.2 % — AB (ref 36.0–46.0)
Hemoglobin: 10.6 g/dL — ABNORMAL LOW (ref 12.0–15.0)
MCH: 33.7 pg (ref 26.0–34.0)
MCHC: 35.1 g/dL (ref 30.0–36.0)
MCV: 95.9 fL (ref 78.0–100.0)
Platelets: 109 10*3/uL — ABNORMAL LOW (ref 150–400)
RBC: 3.15 MIL/uL — AB (ref 3.87–5.11)
RDW: 17.6 % — ABNORMAL HIGH (ref 11.5–15.5)
WBC: 5.1 10*3/uL (ref 4.0–10.5)

## 2015-01-29 LAB — BASIC METABOLIC PANEL
Anion gap: 10 (ref 5–15)
BUN: 57 mg/dL — ABNORMAL HIGH (ref 6–20)
CHLORIDE: 100 mmol/L — AB (ref 101–111)
CO2: 30 mmol/L (ref 22–32)
CREATININE: 2.53 mg/dL — AB (ref 0.44–1.00)
Calcium: 8.3 mg/dL — ABNORMAL LOW (ref 8.9–10.3)
GFR calc Af Amer: 20 mL/min — ABNORMAL LOW (ref >60)
GFR calc non Af Amer: 17 mL/min — ABNORMAL LOW (ref >60)
GLUCOSE: 68 mg/dL (ref 65–99)
POTASSIUM: 3.4 mmol/L — AB (ref 3.5–5.1)
Sodium: 140 mmol/L (ref 135–145)

## 2015-01-29 LAB — GLUCOSE, CAPILLARY
GLUCOSE-CAPILLARY: 88 mg/dL (ref 65–99)
Glucose-Capillary: 96 mg/dL (ref 65–99)
Glucose-Capillary: 94 mg/dL (ref 65–99)

## 2015-01-29 LAB — MAGNESIUM: Magnesium: 1.7 mg/dL (ref 1.7–2.4)

## 2015-01-29 LAB — HEPARIN LEVEL (UNFRACTIONATED): HEPARIN UNFRACTIONATED: 2.1 [IU]/mL — AB (ref 0.30–0.70)

## 2015-01-29 LAB — PHOSPHORUS: PHOSPHORUS: 3 mg/dL (ref 2.5–4.6)

## 2015-01-29 LAB — APTT: aPTT: 73 seconds — ABNORMAL HIGH (ref 24–37)

## 2015-01-29 MED ORDER — VITAL HIGH PROTEIN PO LIQD
1000.0000 mL | ORAL | Status: DC
  Administered 2015-01-29: 1000 mL
  Filled 2015-01-29 (×5): qty 1000

## 2015-01-29 MED ORDER — MAGNESIUM SULFATE IN D5W 10-5 MG/ML-% IV SOLN
1.0000 g | Freq: Once | INTRAVENOUS | Status: AC
  Administered 2015-01-29: 1 g via INTRAVENOUS
  Filled 2015-01-29: qty 100

## 2015-01-29 MED ORDER — VITAL HIGH PROTEIN PO LIQD
1000.0000 mL | ORAL | Status: DC
  Filled 2015-01-29: qty 1000

## 2015-01-29 MED ORDER — ONDANSETRON HCL 4 MG/2ML IJ SOLN
INTRAMUSCULAR | Status: AC
  Filled 2015-01-29: qty 2

## 2015-01-29 NOTE — Progress Notes (Signed)
[facility] Progress Note Patient Name: Brittney Thomas DOB: 16-Oct-1932 MRN: [id number]   Date of Service  01/29/2015  HPI/Events of Note  Low Mg  eICU Interventions  replaced     Intervention Category Minor Interventions: Electrolytes abnormality - evaluation and management  Mauri Brooklyn, P 01/29/2015, 5:16 AM

## 2015-01-29 NOTE — Progress Notes (Signed)
ANTICOAGULATION CONSULT NOTE - Follow Up Consult  Pharmacy Consult for Heparin Indication: atrial fibrillation  Allergies  Allergen Reactions  . Poison Ivy Extract [Extract Of Poison Ivy] Rash  . Sulfa Antibiotics Rash  . Sulfacetamide Sodium Rash     Weight: 186 lb 4.6 oz (84.5 kg) Heparin Weight: 74.8KG   Vital Signs: Temp: 98 F (36.7 C) (07/31 0022) Temp Source: Oral (07/31 0022) BP: 120/101 mmHg (07/31 0321) Pulse Rate: 117 (07/31 0321)  Labs:  Recent Labs  01/22/2015 1745 01/27/15 0223  01/27/15 0443  01/28/15 0854 01/28/15 0855 01/28/15 1024 01/28/15 1800 01/29/15 0300  HGB 12.1  --   --  11.2*  --   --   --  10.9*  --  10.6*  HCT 38.5  --   --  34.7*  --   --   --  30.6*  --  30.2*  PLT 122*  --   --  107*  --   --   --  111*  --  109*  APTT  --   --   < >  --   < > 68*  --   --  60* 73*  HEPARINUNFRC  --  >2.20*  --   --   --   --  >2.20*  --   --   --   CREATININE 2.83*  --   --  2.95*  --   --   --   --   --   --   < > = values in this interval not displayed.  Estimated Creatinine Clearance: 17 mL/min (by C-G formula based on Cr of 2.95).  Medications:  Infusions:  . sodium chloride 125 mL/hr at 01/28/15 2250  . fentaNYL infusion INTRAVENOUS 100 mcg/hr (01/29/15 0300)  . heparin 850 Units/hr (01/29/15 0300)  . norepinephrine (LEVOPHED) Adult infusion     Assessment: 81 yo female presented to ED with hypotension. Has a history of COPD and CHF, prior DVTs and stage IV lung cancer. Patient was on Apixaban prior to admission for Afib but now transitioned to heparin IV. APTT therapeutic. Heparin level 2.1 (still being affected by Apixaban) No bleeding or other problems noted.   Goal of Therapy:  Heparin level 0.3-0.7 units/ml Monitor platelets by anticoagulation protocol: Yes  APTT 66-102 sec   Plan:  - Continue heparin gtt at 850 units/hr - Monitor daily heparin levels, CBC, aPTT  Sherlon Handing, PharmD, BCPS Clinical pharmacist, pager [phone]  01/29/2015 3:50 AM

## 2015-01-29 NOTE — Progress Notes (Signed)
PULMONARY / CRITICAL CARE MEDICINE   Name: Brittney Thomas MRN: [id number] DOB: 10-24-1932    ADMISSION DATE:  01/08/2015 CONSULTATION DATE:  01/29/2015  REFERRING MD :  EDP  CHIEF COMPLAINT:  Hypotension  INITIAL PRESENTATION:  81 y.o. F  brought to [facility] ED 7/28 with hypotension and AMS.  PCCM called for admission given persistent hypotension despite 2.5 L IVF. Patient intubated, central line placed  BRIEF PATIENT DESCRIPTION: Brittney Thomas is an 81 y.o. F with NSCLD on po chemo daily, with PMH of HTN, thyroid dz, COPD, VCD, PE 2008, pulm HTN. She is followed by Dr. Lake Bells for chronic respiratory failure. She was brought to [facility] ED 01/13/2015 due to being lethargic and minimally arouseable by her daughter earlier that day.  Daughter states pt was in her USOH up until 1 day prior; however, after eating supper that night, she became nauseous. She gave her zofran which relieved nausea.  The following morning, pt was very somnolent initially and didn't each much of her breakfast.  As the day progressed, pt became more and more somnolent to the point she was lethargic and minimally responsive per daughter.  Daughter was concerned that pt may had taken 1 or 2 extra pain pills on morning of ED presentation.  She had a mechanical fall 1 week ago and had generalized aches following the fall.  Daughter states that pt has not had any fevers/chills/sweats, chest pain, SOB, N/V/D, abdominal pain, myalgias.   In ED, pt was found to be hypothermic to 93.95F, hypotensive with initial SBP 76 / 45 with lactate of 2.71.  She remained hypotensive with SBP in low 80's despite 2.5 L IVF.  PCCM was called for admission.  Of note, she had recent admission 11/29/14 through 12/09/14 for SOB thought due to aspiration and klebsiella UTI. Additionally, she has hx of stage 4 NSCLC (followed by Dr. Julien Nordmann) and is on daily PO chemo.  STUDIES:  CXR 7/28 >>> CM with early edema.  Basilar ATX.  SIGNIFICANT EVENTS: 7/28 - admitted with  hypotension / shock.  LINES/DRAINS: 7/28 ETT >> 7/28 L IJ Central line >>  SUBJECTIVE:  Responsive  VITAL SIGNS: Temp:  [97.6 F (36.4 C)-98.9 F (37.2 C)] 97.9 F (36.6 C) (07/31 0751) Pulse Rate:  [68-133] 121 (07/31 0821) Resp:  [16-23] 17 (07/31 0821) BP: (91-136)/(51-101) 133/79 mmHg (07/31 0821) SpO2:  [90 %-99 %] 96 % (07/31 0821) FiO2 (%):  [40 %-50 %] 40 % (07/31 0821) Weight:  [183 lb 6.8 oz (83.2 kg)] 183 lb 6.8 oz (83.2 kg) (07/31 0400) HEMODYNAMICS: CVP:  [10 mmHg-20 mmHg] 10 mmHg VENTILATOR SETTINGS: Vent Mode:  [-] PRVC FiO2 (%):  [40 %-50 %] 40 % Set Rate:  [16 bmp] 16 bmp Vt Set:  [500 mL] 500 mL PEEP:  [5 cmH20] 5 cmH20 Plateau Pressure:  [18 cmH20-23 cmH20] 21 cmH20 INTAKE / OUTPUT: Intake/Output      07/30 0701 - 07/31 0700 07/31 0701 - 08/01 0700   I.V. (mL/kg) 3493.8 (42) 137 (1.6)   NG/GT 90 30   IV Piggyback 200    Total Intake(mL/kg) 3783.8 (45.5) 167 (2)   Urine (mL/kg/hr) 3810 (1.9)    Emesis/NG output 100 (0.1)    Total Output 3910     Net -126.2 +167          PHYSICAL EXAMINATION: General: Chronically ill appearing female, in NAD. Neuro: Follows commands HEENT: [place]/AT. Cardiovascular: IRIR, no M/R/G.  Lungs: Respirations shallow and unlabored.  Decreased bilaterally.  Abdomen: abdomen distended central predominant, tympanic, soft on deep palpation, absent BS Musculoskeletal: BLE edema and erythema / venous stasis changes, vs cellulitis Skin: Intact, warm  LABS:  CBC  Recent Labs Lab 01/27/15 0443 01/28/15 1024 01/29/15 0300  WBC 3.7* 4.6 5.1  HGB 11.2* 10.9* 10.6*  HCT 34.7* 30.6* 30.2*  PLT 107* 111* 109*   Coag's  Recent Labs Lab 01/28/15 0854 01/28/15 1800 01/29/15 0300  APTT 68* 60* 73*   BMET  Recent Labs Lab 01/08/2015 1745 01/27/15 0443 01/29/15 0300  NA 137 137 140  K 5.3* 4.9 3.4*  CL 87* 92* 100*  CO2 38* 31 30  BUN 67* 64* 57*  CREATININE 2.83* 2.95* 2.53*  GLUCOSE 93 94 68    Electrolytes  Recent Labs Lab 01/15/2015 1745 01/27/15 0443 01/29/15 0300  CALCIUM 9.6 9.2 8.3*  MG  --  2.0 1.7  PHOS  --  4.7* 3.0   Sepsis Markers  Recent Labs Lab 01/27/15 0225 01/27/15 1115 01/28/15 1043  LATICACIDVEN 3.9* 2.0 1.3   ABG  Recent Labs Lab 01/27/15 0140  PHART 7.522*  PCO2ART 45.5*  PO2ART 313.0*   Liver Enzymes  Recent Labs Lab 01/24/15 1041 01/23/2015 1745  AST 39* 39  ALT 18 21  ALKPHOS 159* 144*  BILITOT 1.13 1.4*  ALBUMIN 3.3* 3.3*   Cardiac Enzymes No results for input(s): TROPONINI, PROBNP in the last 168 hours. Glucose  Recent Labs Lab 01/27/15 0048  GLUCAP 96    Imaging Dg Chest Port 1 View  01/29/2015   CLINICAL DATA:  Respiratory failure  EXAM: PORTABLE CHEST - 1 VIEW  COMPARISON:  Radiograph 01/28/2015  FINDINGS: Endotracheal 2, LEFT central venous line , and NG tube are unchanged. There is RIGHT basilar atelectasis which is increased. LEFT lung is clear. No pneumothorax.  IMPRESSION: 1. Stable support apparatus. 2. Interval increase in RIGHT basilar atelectasis.   Electronically Signed   By: Suzy Bouchard M.D.   On: 01/29/2015 07:55     ASSESSMENT / PLAN:  PULMONARY A: Acute on chronic hypoxic respiratory failure requiring mechanical ventilation Stage IV NSCLC - on daily  PO chemo (followed by Dr. Julien Nordmann).   PAH (PASP 31 by echo 12/09/14). COPD by report - no PFT's on file. P:   Vent bundle Would recommend palliative care consult. Not currently weanable   CARDIOVASCULAR L IJ central 7/28 >>> A:  Septic shock - resolved Hx HTN, PVD, IVC filter Negative troponin P:  Vasopressor support as needed for goal MAP > 60, currently off pressors cvp 10 but with elevated pa pressures ? Correlation.  No role stress steroids as cortisol normal (> 60). Hold outpatient diltiazem, furosemide, metolazone, metoprolol.  RENAL  Recent Labs Lab 01/04/2015 1745 01/27/15 0443 01/29/15 0300  K 5.3* 4.9 3.4*   Lab  Results  Component Value Date   CREATININE 2.53* 01/29/2015   CREATININE 2.95* 01/27/2015   CREATININE 2.83* 01/10/2015   CREATININE 2.2* 01/24/2015   CREATININE 2.0* 01/17/2015   CREATININE 2.0* 01/11/2015     A:   Mild hyperkalemia. resolved Metabolic alkalosis. AoCKD - Cr 2.95, baseline 1.8 > improving Hypovolemia / ATN P:   NS increase BMP daily  GASTROINTESTINAL A:   Distended abdomen with hx of nausea, concern for abdominal process Esophageal reflux. Nutrition. P:    CT abd with ascites  Follow hepatic panel as needed NPO for now. OGT to int suction  HEMATOLOGIC / ONCOLOGIC A:   Hx stage IV NSLC - on daily  PO chemo (followed by Dr. Julien Nordmann). Pancytopenia - due to chemo. Hx DVT / PE - on chronic apixaban VTE Prophylaxis. P:  Hold outpatient daily iressa while NP Hold daily apixaban. SCD's / Heparin gtt Coags. CBC   INFECTIOUS A:   Septic shock - due to sepsis of urinary source most likely P:   BCx2 7/28 > UCx 7/28 >Kleb pneum ss pip/tazo and ceftriaxone  Sputum Cx 7/28 >  Abx: Vanc, start date 7/28>>>7/30 Abx: Zosyn, start date 7/28>>>7/30 7/30 roc>>   Image renals now for hydro 7/28 >>neg  ENDOCRINE A:   Hypothyroidism, TSH 1.48 7/29 P:   Continue synthroid IV at 1/2 outpatient PO dose.  NEUROLOGIC A:   Acute metabolic encephalopathy - Related to extra doses of opiates vs hypercarbia.  Also exacerbated by persistent hypotension / shock. Ammonia 53 7/31 follows commands weakly P:   Check ABG, OK 7/29 Hold outpatient norco Fentanyl with WUA  Palliative care consideration   Oncology  updated   Family updated: Daugher, [name] updated over phone on 7/28.  She requests aggressive measures and insistent on full code.  Interdisciplinary Family Meeting v Palliative Care Meeting:  Due by: 02-15-15.  Richardson Landry Minor ACNP Maryanna Shape PCCM Pager [phone] till 3 pm If no answer page [phone] 01/29/2015, 9:02 AM  Attending:  I have seen and  examined the patient with nurse practitioner/resident and agree with the note above.   More awake today, off pressors  Lungs clear, vent supported breaths Still very weak  UTI sepsis improved, now not in shock Acute respiratory failure> we are now limited in terms of extubation by her weakness; May need to diurese if fails SBT tomorrow AKI> improving  Overall prognosis dismal; I tried to call her daughter yesterday and could not get through.  Will try to meet her again today.  My cc time 35 minutes  Roselie Awkward, MD [place] PCCM Pager: [phone] Cell: [phone] After 3pm or if no response, call [phone]

## 2015-01-30 ENCOUNTER — Inpatient Hospital Stay (HOSPITAL_COMMUNITY): Payer: Medicare Other

## 2015-01-30 DIAGNOSIS — J9819 Other pulmonary collapse: Secondary | ICD-10-CM

## 2015-01-30 DIAGNOSIS — R140 Abdominal distension (gaseous): Secondary | ICD-10-CM | POA: Insufficient documentation

## 2015-01-30 DIAGNOSIS — R569 Unspecified convulsions: Secondary | ICD-10-CM | POA: Insufficient documentation

## 2015-01-30 LAB — CBC
HCT: 29.2 % — ABNORMAL LOW (ref 36.0–46.0)
HEMOGLOBIN: 10.3 g/dL — AB (ref 12.0–15.0)
MCH: 34.2 pg — ABNORMAL HIGH (ref 26.0–34.0)
MCHC: 35.3 g/dL (ref 30.0–36.0)
MCV: 97 fL (ref 78.0–100.0)
Platelets: 101 10*3/uL — ABNORMAL LOW (ref 150–400)
RBC: 3.01 MIL/uL — ABNORMAL LOW (ref 3.87–5.11)
RDW: 17.5 % — AB (ref 11.5–15.5)
WBC: 4.7 10*3/uL (ref 4.0–10.5)

## 2015-01-30 LAB — PHOSPHORUS: Phosphorus: 2.9 mg/dL (ref 2.5–4.6)

## 2015-01-30 LAB — GLUCOSE, CAPILLARY
GLUCOSE-CAPILLARY: 102 mg/dL — AB (ref 65–99)
GLUCOSE-CAPILLARY: 104 mg/dL — AB (ref 65–99)
GLUCOSE-CAPILLARY: 100 mg/dL — AB (ref 65–99)
Glucose-Capillary: 100 mg/dL — ABNORMAL HIGH (ref 65–99)
Glucose-Capillary: 106 mg/dL — ABNORMAL HIGH (ref 65–99)
Glucose-Capillary: 137 mg/dL — ABNORMAL HIGH (ref 65–99)
Glucose-Capillary: 85 mg/dL (ref 65–99)

## 2015-01-30 LAB — BASIC METABOLIC PANEL
Anion gap: 6 (ref 5–15)
BUN: 46 mg/dL — ABNORMAL HIGH (ref 6–20)
CO2: 28 mmol/L (ref 22–32)
CREATININE: 2.13 mg/dL — AB (ref 0.44–1.00)
Calcium: 8.2 mg/dL — ABNORMAL LOW (ref 8.9–10.3)
Chloride: 105 mmol/L (ref 101–111)
GFR calc Af Amer: 24 mL/min — ABNORMAL LOW (ref >60)
GFR, EST NON AFRICAN AMERICAN: 21 mL/min — AB (ref >60)
GLUCOSE: 98 mg/dL (ref 65–99)
Potassium: 3.2 mmol/L — ABNORMAL LOW (ref 3.5–5.1)
Sodium: 139 mmol/L (ref 135–145)

## 2015-01-30 LAB — MAGNESIUM: MAGNESIUM: 1.8 mg/dL (ref 1.7–2.4)

## 2015-01-30 LAB — HEPARIN LEVEL (UNFRACTIONATED): Heparin Unfractionated: 1.52 IU/mL — ABNORMAL HIGH (ref 0.30–0.70)

## 2015-01-30 LAB — APTT: aPTT: 91 seconds — ABNORMAL HIGH (ref 24–37)

## 2015-01-30 MED ORDER — POTASSIUM CHLORIDE 20 MEQ/15ML (10%) PO SOLN
40.0000 meq | ORAL | Status: AC
  Administered 2015-01-30 (×2): 40 meq
  Filled 2015-01-30 (×2): qty 30

## 2015-01-30 MED ORDER — LEVALBUTEROL HCL 0.63 MG/3ML IN NEBU
0.6300 mg | INHALATION_SOLUTION | Freq: Four times a day (QID) | RESPIRATORY_TRACT | Status: DC | PRN
  Administered 2015-01-30 – 2015-02-01 (×4): 0.63 mg via RESPIRATORY_TRACT
  Filled 2015-01-30 (×4): qty 3

## 2015-01-30 MED ORDER — LACTULOSE 10 GM/15ML PO SOLN
30.0000 g | Freq: Two times a day (BID) | ORAL | Status: DC
  Administered 2015-01-30 – 2015-01-31 (×3): 30 g via ORAL
  Filled 2015-01-30 (×6): qty 45

## 2015-01-30 MED ORDER — LORAZEPAM 2 MG/ML IJ SOLN
INTRAMUSCULAR | Status: AC
  Filled 2015-01-30: qty 1

## 2015-01-30 MED ORDER — MIDAZOLAM HCL 2 MG/2ML IJ SOLN
2.0000 mg | INTRAMUSCULAR | Status: DC | PRN
  Administered 2015-01-30 – 2015-01-31 (×3): 2 mg via INTRAVENOUS
  Filled 2015-01-30 (×2): qty 2

## 2015-01-30 MED ORDER — ACETYLCYSTEINE 20 % IN SOLN
4.0000 mL | Freq: Two times a day (BID) | RESPIRATORY_TRACT | Status: AC
  Administered 2015-01-30 – 2015-01-31 (×3): 4 mL via RESPIRATORY_TRACT
  Filled 2015-01-30 (×4): qty 4

## 2015-01-30 MED ORDER — MIDAZOLAM HCL 2 MG/2ML IJ SOLN
INTRAMUSCULAR | Status: AC
  Filled 2015-01-30: qty 2

## 2015-01-30 MED ORDER — SODIUM CHLORIDE 0.9 % IV SOLN: 500.0000 mg | Freq: Two times a day (BID) | INTRAVENOUS | Status: DC

## 2015-01-30 MED ORDER — WHITE PETROLATUM GEL
Status: AC
  Administered 2015-01-30: 1
  Filled 2015-01-30: qty 1

## 2015-01-30 MED ORDER — SODIUM CHLORIDE 0.9 % IV SOLN
500.0000 mg | Freq: Two times a day (BID) | INTRAVENOUS | Status: DC
  Administered 2015-01-30 – 2015-02-01 (×5): 500 mg via INTRAVENOUS
  Filled 2015-01-30 (×7): qty 5

## 2015-01-30 NOTE — Progress Notes (Signed)
PEEP increased to 10 per Dr. Titus Mould.  Orders to not adjust PEEP until specified to do so. Will continue to monitor.

## 2015-01-30 NOTE — Progress Notes (Signed)
Patient ID: MAIRA CHRISTON, female   DOB: 1933-04-19, 81 y.o.   MRN: [id number] I have had extensive discussions with family daughter. We discussed patients current circumstances and organ failures. We also discussed patient's prior wishes under circumstances such as this. Family has decided to NOT perform resuscitation if arrest but to continue current medical support for now. No escalation, no addition pressors Note recent loss of sons son death MVA, adding a lot of stress to this already horrible circumstance  Lavon Paganini. Titus Mould, MD, [place] Pgr: [facility]

## 2015-01-30 NOTE — Progress Notes (Signed)
Bedside EEG completed, results pending. 

## 2015-01-30 NOTE — Progress Notes (Signed)
PULMONARY / CRITICAL CARE MEDICINE   Name: EPIFANIA LITTRELL MRN: [id number] DOB: 11/18/1932    ADMISSION DATE:  01/12/2015 CONSULTATION DATE:  01/30/2015  REFERRING MD :  EDP  CHIEF COMPLAINT:  Hypotension  INITIAL PRESENTATION:  81 y.o. F  brought to [facility] ED 7/28 with hypotension and AMS.  PCCM called for admission given persistent hypotension despite 2.5 L IVF. Patient intubated, central line placed  BRIEF PATIENT DESCRIPTION: JOSLYNE MARSHBURN is an 81 y.o. F with NSCLD on po chemo daily, with PMH of HTN, thyroid dz, COPD, VCD, PE 2008, pulm HTN. She is followed by Dr. Lake Bells for chronic respiratory failure. She was brought to [facility] ED 01/18/2015 due to being lethargic Of note, she had recent admission 11/29/14 through 12/09/14 for SOB thought due to aspiration and klebsiella UTI. Additionally, she has hx of stage 4 NSCLC (followed by Dr. Julien Nordmann) and is on daily PO chemo.  STUDIES:  CXR 7/28 >>> CM with early edema.  Basilar ATX. 7/30 ct abdo/pelvis>>>overlaod, effusion rt, cirrhosis  SIGNIFICANT EVENTS: 7/28 - admitted with hypotension / shock. 8/1- seizure  LINES/DRAINS: 7/28 ETT >> 7/28 L IJ Central line >>  SUBJECTIVE:  unresponsive  VITAL SIGNS: Temp:  [97.6 F (36.4 C)-98.8 F (37.1 C)] 98.8 F (37.1 C) (08/01 0425) Pulse Rate:  [35-151] 89 (08/01 0849) Resp:  [14-21] 16 (08/01 0849) BP: (82-151)/(49-109) 109/71 mmHg (08/01 0849) SpO2:  [88 %-100 %] 100 % (08/01 0849) FiO2 (%):  [30 %-40 %] 30 % (08/01 0849) Weight:  [86.2 kg (190 lb 0.6 oz)] 86.2 kg (190 lb 0.6 oz) (08/01 0500) HEMODYNAMICS:   VENTILATOR SETTINGS: Vent Mode:  [-] PRVC FiO2 (%):  [30 %-40 %] 30 % Set Rate:  [16 bmp] 16 bmp Vt Set:  [500 mL] 500 mL PEEP:  [5 cmH20] 5 cmH20 Plateau Pressure:  [18 cmH20-22 cmH20] 21 cmH20 INTAKE / OUTPUT: Intake/Output      07/31 0701 - 08/01 0700 08/01 0701 - 08/02 0700   I.V. (mL/kg) 3351.8 (38.9)    NG/GT 497.7    IV Piggyback 50    Total Intake(mL/kg) 3899.4 (45.2)     Urine (mL/kg/hr) 2225 (1.1) 275 (1.2)   Emesis/NG output     Total Output 2225 275   Net +1674.4 -275          PHYSICAL EXAMINATION: General: Chronically ill appearing female, in NAD. Neuro: not following commands, perr 77m HEENT: [place]/AT. Cardiovascular: IRr, no M/R/G.  Lungs:clear Abdomen: abdomen distended central predominant, tympanic, soft on deep palpation, absent BS Musculoskeletal: BLE edema and erythema / venous stasis changes likley Skin: Intact, warm  LABS:  CBC  Recent Labs Lab 01/28/15 1024 01/29/15 0300 01/30/15 0525  WBC 4.6 5.1 4.7  HGB 10.9* 10.6* 10.3*  HCT 30.6* 30.2* 29.2*  PLT 111* 109* 101*   Coag's  Recent Labs Lab 01/28/15 1800 01/29/15 0300 01/30/15 0525  APTT 60* 73* 91*   BMET  Recent Labs Lab 01/27/15 0443 01/29/15 0300 01/30/15 0525  NA 137 140 139  K 4.9 3.4* 3.2*  CL 92* 100* 105  CO2 '31 30 28  '$ BUN 64* 57* 46*  CREATININE 2.95* 2.53* 2.13*  GLUCOSE 94 68 98   Electrolytes  Recent Labs Lab 01/27/15 0443 01/29/15 0300 01/30/15 0525  CALCIUM 9.2 8.3* 8.2*  MG 2.0 1.7 1.8  PHOS 4.7* 3.0 2.9   Sepsis Markers  Recent Labs Lab 01/27/15 0225 01/27/15 1115 01/28/15 1043  LATICACIDVEN 3.9* 2.0 1.3   ABG  Recent Labs Lab 01/27/15 0140  PHART 7.522*  PCO2ART 45.5*  PO2ART 313.0*   Liver Enzymes  Recent Labs Lab 01/24/15 1041 01/01/2015 1745  AST 39* 39  ALT 18 21  ALKPHOS 159* 144*  BILITOT 1.13 1.4*  ALBUMIN 3.3* 3.3*   Cardiac Enzymes No results for input(s): TROPONINI, PROBNP in the last 168 hours. Glucose  Recent Labs Lab 01/29/15 1539 01/29/15 1930 01/30/15 0017 01/30/15 0423 01/30/15 0645 01/30/15 0832  GLUCAP 96 94 85 102* 137* 104*    Imaging Dg Chest Port 1 View  01/30/2015   CLINICAL DATA:  Hypoxia  EXAM: PORTABLE CHEST - 1 VIEW  COMPARISON:  January 29, 2015  FINDINGS: Endotracheal tube tip is 2.8 cm above the carina. Nasogastric tube tip and side port are in the stomach. Central  catheter tip is in the superior vena cava. No pneumothorax. There is consolidation on the right with volume loss in portions of the right middle and lower lobes with small right effusion. Left lung is clear. Heart size is within normal limits. The pulmonary vascular is normal. There is atherosclerotic change in the aorta. There is an old healed fracture of the right clavicle.  IMPRESSION: Tube and catheter positions as described without pneumothorax. Consolidation with volume loss involving portions the right middle and lower lobes, stable. Small right effusion. Left lung clear. No change in cardiac silhouette.   Electronically Signed   By: Lowella Grip III M.D.   On: 01/30/2015 07:19     ASSESSMENT / PLAN:  PULMONARY A: Acute on chronic hypoxic respiratory failure requiring mechanical ventilation Stage IV NSCLC - on daily  PO chemo (followed by Dr. Julien Nordmann).   PAH (PASP 31 by echo 12/09/14). COPD by report - no PFT's on file. collapse rml, rll P:   Vent bundle Consider bronch for collapse, first increase peep, although with cancer and fullness at [facility] on last CT , may consider earlier for endobronchial lesion Ensure chest PT NO SBT mucomysts  CARDIOVASCULAR L IJ central 7/28 >>> A:  Septic shock - resolved Hx HTN, PVD, IVC filter Negative troponin P:  cvp dc given pulm pressures No role stress steroids as cortisol normal (> 60). Hold outpatient diltiazem, furosemide, metolazone, metoprolol.  RENAL  Recent Labs Lab 01/27/15 0443 01/29/15 0300 01/30/15 0525  K 4.9 3.4* 3.2*   Lab Results  Component Value Date   CREATININE 2.13* 01/30/2015   CREATININE 2.53* 01/29/2015   CREATININE 2.95* 01/27/2015   CREATININE 2.2* 01/24/2015   CREATININE 2.0* 01/17/2015   CREATININE 2.0* 01/11/2015     A:   Mild hyperkalemia. resolved Metabolic alkalosis. AoCKD - Cr 2.95, baseline 1.8 > improving Hypovolemia / ATN P:   NS  Maintain as crt improving with  BMP daily k  supp  GASTROINTESTINAL A:   Distended abdomen with hx of nausea, concern for abdominal process Esophageal reflux. Nutrition. 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 Julien Nordmann). Pancytopenia - due to chemo. Hx DVT / PE - on chronic apixaban VTE Prophylaxis. P:  Hold outpatient daily iressa  SCD's / Heparin gtt dc until MRi  / ct head done with new changes Coags. CBC  scd  INFECTIOUS A:   Septic shock - due to sepsis of urinary source most likely P:   BCx2 7/28 >  UCx 7/28 >Kleb pneum ss pip/tazo and ceftriaxone  Sputum Cx 7/28 >  Abx: Vanc, start date 7/28>>>7/30 Abx: Zosyn, start date 7/28>>>7/30 7/30 roc>> Add stop date 8/3  Image renals now for hydro 7/28 >>neg  ENDOCRINE A:   Hypothyroidism, TSH 1.48 7/29 P:   Continue synthroid IV at 1/2 outpatient PO dose.  NEUROLOGIC A:   Acute metabolic encephalopathy - Related to extra doses of opiates vs hypercarbia.  Also exacerbated by persistent hypotension / shock. Ammonia 53 7/31 follows commands weakly New onset seizures r/o mets, hepatic encpeph? P:   Hold outpatient norco Fentanyl with WUA  Will have a meeting with family Add lactulose MRI brain if able, if not immediate then CT head eeg Dc hep drip for now  If mets  = decardon benzo   Oncology  updated   Family updated: Daugher, [name] updated over phone on 7/28.  She requests aggressive measures and insistent on full code.  Interdisciplinary Family Meeting v Palliative Care Meeting:  Due by: 2015/02/17.  Ccm time 30 min   Lavon Paganini. Titus Mould, MD, [place] Pgr: [facility]

## 2015-01-30 NOTE — Progress Notes (Signed)
Patient transported to MRI and back to room 2M02 without any complications.

## 2015-01-30 NOTE — Progress Notes (Signed)
eLink Physician-Brief Progress Note Patient Name: Brittney Thomas DOB: May 27, 1933 MRN: [id number]   Date of Service  01/30/2015  HPI/Events of Note       Seizure. Appears tonic clonic.  eICU Interventions  Will order: 1. Versed 2 mg IV Q 30 minutes PRN.  2. Keppra 1000 mg IV now and Q 12 hours. 3. Head CT Scan now.     Intervention Category Major Interventions: Seizures - evaluation and management  Tad Fancher [place] 01/30/2015, 6:45 AM

## 2015-01-30 NOTE — Care Management Important Message (Signed)
Important Message  Patient Details  Name: Brittney Thomas MRN: [id number] Date of Birth: 21-Sep-1932   Medicare Important Message Given:  [facility] notification given    Nathen May 01/30/2015, 11:55 AMImportant Message  Patient Details  Name: Brittney Thomas MRN: [id number] Date of Birth: Sep 19, 1932   Medicare Important Message Given:  Yes-second notification given    Nathen May 01/30/2015, 11:55 AM

## 2015-01-30 NOTE — Procedures (Signed)
History: 81 yo M with AMS  Sedation: versed '2mg'$  at 7am  Technique: This is a 19 channel routine scalp EEG performed at the bedside with bipolar and monopolar montages arranged in accordance to the international 10/20 system of electrode placement. One channel was dedicated to EKG recording.    Background: The background consists predominantly of low voltage irregular generalized delta activity, as well as a low voltage PDR. The posterior rhythm achieves a maximal frequency of 7.5 Hz. No sleep is recorded.   Photic stimulation: Physiologic driving is not performed  EEG Abnormalities: 1) Generalized irregular slow activity 2) Slow PDR.   Clinical Interpretation: This EEG is consistent with a mild generalized non-specific cerebral dysfunction(encephalopathy). There was no seizure or seizure predisposition recorded on this study.   Roland Rack, MD [facility] [phone]  If 7pm- 7am, please page neurology on call as listed in [place].

## 2015-01-30 DEATH — deceased

## 2015-01-31 ENCOUNTER — Inpatient Hospital Stay (HOSPITAL_COMMUNITY): Payer: Medicare Other

## 2015-01-31 ENCOUNTER — Telehealth: Payer: Self-pay | Admitting: Internal Medicine

## 2015-01-31 ENCOUNTER — Other Ambulatory Visit: Payer: Medicare Other

## 2015-01-31 LAB — CBC WITH DIFFERENTIAL/PLATELET
Basophils Absolute: 0 10*3/uL (ref 0.0–0.1)
Basophils Relative: 1 % (ref 0–1)
Eosinophils Absolute: 0.1 10*3/uL (ref 0.0–0.7)
Eosinophils Relative: 3 % (ref 0–5)
HEMATOCRIT: 25.1 % — AB (ref 36.0–46.0)
HEMOGLOBIN: 8.8 g/dL — AB (ref 12.0–15.0)
Lymphocytes Relative: 22 % (ref 12–46)
Lymphs Abs: 0.8 10*3/uL (ref 0.7–4.0)
MCH: 33.6 pg (ref 26.0–34.0)
MCHC: 35.1 g/dL (ref 30.0–36.0)
MCV: 95.8 fL (ref 78.0–100.0)
MONO ABS: 0.5 10*3/uL (ref 0.1–1.0)
MONOS PCT: 12 % (ref 3–12)
Neutro Abs: 2.3 10*3/uL (ref 1.7–7.7)
Neutrophils Relative %: 62 % (ref 43–77)
Platelets: 82 10*3/uL — ABNORMAL LOW (ref 150–400)
RBC: 2.62 MIL/uL — AB (ref 3.87–5.11)
RDW: 17.7 % — AB (ref 11.5–15.5)
WBC: 3.6 10*3/uL — ABNORMAL LOW (ref 4.0–10.5)

## 2015-01-31 LAB — COMPREHENSIVE METABOLIC PANEL
ALBUMIN: 2.2 g/dL — AB (ref 3.5–5.0)
ALT: 13 U/L — AB (ref 14–54)
ANION GAP: 10 (ref 5–15)
AST: 24 U/L (ref 15–41)
Alkaline Phosphatase: 85 U/L (ref 38–126)
BILIRUBIN TOTAL: 1.4 mg/dL — AB (ref 0.3–1.2)
BUN: 39 mg/dL — ABNORMAL HIGH (ref 6–20)
CALCIUM: 8.1 mg/dL — AB (ref 8.9–10.3)
CO2: 26 mmol/L (ref 22–32)
Chloride: 106 mmol/L (ref 101–111)
Creatinine, Ser: 1.93 mg/dL — ABNORMAL HIGH (ref 0.44–1.00)
GFR calc Af Amer: 27 mL/min — ABNORMAL LOW (ref >60)
GFR calc non Af Amer: 23 mL/min — ABNORMAL LOW (ref >60)
Glucose, Bld: 102 mg/dL — ABNORMAL HIGH (ref 65–99)
Potassium: 3.9 mmol/L (ref 3.5–5.1)
Sodium: 142 mmol/L (ref 135–145)
Total Protein: 4.9 g/dL — ABNORMAL LOW (ref 6.5–8.1)

## 2015-01-31 LAB — APTT: aPTT: 36 seconds (ref 24–37)

## 2015-01-31 LAB — CULTURE, BLOOD (ROUTINE X 2)
Culture: NO GROWTH
Culture: NO GROWTH

## 2015-01-31 LAB — GLUCOSE, CAPILLARY
GLUCOSE-CAPILLARY: 101 mg/dL — AB (ref 65–99)
GLUCOSE-CAPILLARY: 104 mg/dL — AB (ref 65–99)
GLUCOSE-CAPILLARY: 104 mg/dL — AB (ref 65–99)
GLUCOSE-CAPILLARY: 114 mg/dL — AB (ref 65–99)
GLUCOSE-CAPILLARY: 98 mg/dL (ref 65–99)
Glucose-Capillary: 100 mg/dL — ABNORMAL HIGH (ref 65–99)

## 2015-01-31 MED ORDER — HEPARIN (PORCINE) IN NACL 100-0.45 UNIT/ML-% IJ SOLN
850.0000 [IU]/h | INTRAMUSCULAR | Status: DC
  Administered 2015-01-31: 850 [IU]/h via INTRAVENOUS
  Filled 2015-01-31 (×3): qty 250

## 2015-01-31 MED ORDER — LEVOTHYROXINE SODIUM 88 MCG PO TABS
88.0000 ug | ORAL_TABLET | Freq: Every day | ORAL | Status: DC
  Filled 2015-01-31 (×2): qty 1

## 2015-01-31 NOTE — Procedures (Signed)
Extubation Procedure Note  Patient Details:   Name: LAPORCHA MARCHESI DOB: [birth year] MRN: [id number]   Airway Documentation:     Evaluation  O2 sats: stable throughout Complications: No apparent complications Patient did tolerate procedure well. Bilateral Breath Sounds: Rhonchi Suctioning: Airway Yes   Patient extubated to 4L nasal cannula per MD order.  Positive cuff leak noted.  No evidence of stridor.  Sats 99%.  Vitals are stable.  Patient able to speak post extubation.  No apparent complications.   Philomena Doheny 01/31/2015, 4:17 PM

## 2015-01-31 NOTE — Progress Notes (Signed)
PULMONARY / CRITICAL CARE MEDICINE   Name: BRITAIN ANAGNOS MRN: [id number] DOB: 1933/02/20    ADMISSION DATE:  01/12/2015 CONSULTATION DATE:  01/31/2015  REFERRING MD :  EDP  CHIEF COMPLAINT:  Hypotension  INITIAL PRESENTATION:  81 y.o. F  brought to [facility] ED 7/28 with hypotension and AMS.  PCCM called for admission given persistent hypotension despite 2.5 L IVF. Patient intubated, central line placed  BRIEF PATIENT DESCRIPTION: Brittney Thomas is an 81 y.o. F with NSCLD on po chemo daily, with PMH of HTN, thyroid dz, COPD, VCD, PE 2008, pulm HTN. She is followed by Dr. Lake Bells for chronic respiratory failure. She was brought to [facility] ED 01/14/2015 due to being lethargic Of note, she had recent admission 11/29/14 through 12/09/14 for SOB thought due to aspiration and klebsiella UTI. Additionally, she has hx of stage 4 NSCLC (followed by Dr. Julien Nordmann) and is on daily PO chemo.  STUDIES:  CXR 7/28 >>> CM with early edema.  Basilar ATX. 7/30 ct abdo/pelvis>>>overlaod, effusion rt, cirrhosis 8/1 eeg>>no focus, gen slowing  SIGNIFICANT EVENTS: 7/28 - admitted with hypotension / shock. 8/1- seizure  LINES/DRAINS: 7/28 ETT >> 7/28 L IJ Central line >>  SUBJECTIVE:  Wide awake watching [employer]  VITAL SIGNS: Temp:  [97.4 F (36.3 C)-99.3 F (37.4 C)] 98.6 F (37 C) (08/02 0723) Pulse Rate:  [63-130] 110 (08/02 0727) Resp:  [15-34] 16 (08/02 0727) BP: (78-143)/(41-97) 104/66 mmHg (08/02 0727) SpO2:  [98 %-100 %] 100 % (08/02 0727) FiO2 (%):  [30 %] 30 % (08/02 0727) Weight:  [88.2 kg (194 lb 7.1 oz)] 88.2 kg (194 lb 7.1 oz) (08/02 0431) HEMODYNAMICS:   VENTILATOR SETTINGS: Vent Mode:  [-] PRVC FiO2 (%):  [30 %] 30 % Set Rate:  [16 bmp] 16 bmp Vt Set:  [500 mL] 500 mL PEEP:  [10 cmH20] 10 cmH20 Plateau Pressure:  [20 cmH20-26 cmH20] 23 cmH20 INTAKE / OUTPUT: Intake/Output      08/01 0701 - 08/02 0700 08/02 0701 - 08/03 0700   I.V. (mL/kg) 2895 (32.8)    NG/GT 550    IV Piggyback 255    Total Intake(mL/kg) 3700 (42)    Urine (mL/kg/hr) 1685 (0.8)    Total Output 1685     Net +2015            PHYSICAL EXAMINATION: General: Chronically ill appearing female, anxious Neuro: following commands, perr 68m HEENT: jvd up Cardiovascular: IRr, no M/R/G.  Lungs: ronchi Abdomen: abdomen distended central predominant, soft on deep palpation, present BS Musculoskeletal: BLE edema and erythema / venous stasis changes likley Skin: Intact, warm  LABS:  CBC  Recent Labs Lab 01/29/15 0300 01/30/15 0525 01/31/15 0430  WBC 5.1 4.7 3.6*  HGB 10.6* 10.3* 8.8*  HCT 30.2* 29.2* 25.1*  PLT 109* 101* 82*   Coag's  Recent Labs Lab 01/29/15 0300 01/30/15 0525 01/31/15 0430  APTT 73* 91* 36   BMET  Recent Labs Lab 01/29/15 0300 01/30/15 0525 01/31/15 0430  NA 140 139 142  K 3.4* 3.2* 3.9  CL 100* 105 106  CO2 '30 28 26  '$ BUN 57* 46* 39*  CREATININE 2.53* 2.13* 1.93*  GLUCOSE 68 98 102*   Electrolytes  Recent Labs Lab 01/27/15 0443 01/29/15 0300 01/30/15 0525 01/31/15 0430  CALCIUM 9.2 8.3* 8.2* 8.1*  MG 2.0 1.7 1.8  --   PHOS 4.7* 3.0 2.9  --    Sepsis Markers  Recent Labs Lab 01/27/15 0225 01/27/15 1115 01/28/15 1043  LATICACIDVEN 3.9* 2.0 1.3   ABG  Recent Labs Lab 01/27/15 0140  PHART 7.522*  PCO2ART 45.5*  PO2ART 313.0*   Liver Enzymes  Recent Labs Lab 01/24/15 1041 01/10/2015 1745 01/31/15 0430  AST 39* 39 24  ALT 18 21 13*  ALKPHOS 159* 144* 85  BILITOT 1.13 1.4* 1.4*  ALBUMIN 3.3* 3.3* 2.2*   Cardiac Enzymes No results for input(s): TROPONINI, PROBNP in the last 168 hours. Glucose  Recent Labs Lab 01/30/15 1120 01/30/15 1610 01/30/15 2005 01/30/15 2331 01/31/15 0414 01/31/15 0721  GLUCAP 100* 106* 100* 104* 101* 100*    Imaging Mr Brain Wo Contrast  01/30/2015   CLINICAL DATA:  Seizure.  Lung cancer.  EXAM: MRI HEAD WITHOUT CONTRAST  TECHNIQUE: Multiplanar, multiecho pulse sequences of the brain and surrounding  structures were obtained without intravenous contrast.  COMPARISON:  MRI head 09/05/2014  FINDINGS: Cortical atrophy unchanged.  Ventricle size is within normal limits.  Negative for acute infarct. Mild hyperintensity in the cerebral white matter bilaterally has progressed and this may be related to chemotherapy induced white matter changes.  Pituitary normal in size. Cervical medullary junction normal. No skull lesion.  Negative for intracranial hemorrhage.  Negative for mass or edema. Small metastatic deposits could be missed without intravenous contrast.  IMPRESSION: Negative for acute infarct or mass.  Mild chronic white matter changes have progressed since September 05, 2014 and may be related to chemotherapy.   Electronically Signed   By: Franchot Gallo M.D.   On: 01/30/2015 14:00   Dg Chest Port 1 View  01/31/2015   CLINICAL DATA:  Lung collapse.  EXAM: PORTABLE CHEST - 1 VIEW  COMPARISON:  01/30/2015.  FINDINGS: Endotracheal tube, left IJ line, NG tube in stable position. Partial clearing of right lower lobe atelectasis and infiltrate. Small right pleural effusion cannot be excluded. Stable cardiomegaly.  IMPRESSION: 1. Lines and tubes in stable position. 2. Partial clearing of right lower lobe atelectasis and/or infiltrate. Small right pleural effusion cannot be excluded .   Electronically Signed   By: Marcello Moores  Register   On: 01/31/2015 07:07     ASSESSMENT / PLAN:  PULMONARY A: Acute on chronic hypoxic respiratory failure requiring mechanical ventilation Stage IV NSCLC - on daily  PO chemo (followed by Dr. Julien Nordmann).   PAH (PASP 31 by echo 12/09/14). COPD by report - no PFT's on file. collapse rml, rll - improved peep 10  P:   Vent bundle pcxr is improved, peep to 8 today Wean cpap 8 ps 5, goal 2 hrs DNi with planned extubation Ensure chest PT further mucomysts x 2 more doses then dc  CARDIOVASCULAR L IJ central 7/28 >>> A:  Septic shock - resolved Hx HTN, PVD, IVC filter Negative  troponin P:  No role stress steroids as cortisol normal (> 60). continued pos balance  Hold outpatient diltiazem, furosemide, metolazone, metoprolol.  RENAL  Recent Labs Lab 01/29/15 0300 01/30/15 0525 01/31/15 0430  K 3.4* 3.2* 3.9   Lab Results  Component Value Date   CREATININE 1.93* 01/31/2015   CREATININE 2.13* 01/30/2015   CREATININE 2.53* 01/29/2015   CREATININE 2.2* 01/24/2015   CREATININE 2.0* 01/17/2015   CREATININE 2.0* 81/19/1478     A:   Metabolic alkalosis resolved AoCKD - Cr 2.95, baseline 1.8 > improving daily Hypovolemia / ATN P:   Continued to allow pos balance, improving daily May need kvo in am   GASTROINTESTINAL A:   Distended abdomen with hx of nausea, concern for  abdominal process Esophageal reflux. Nutrition. cirrhosis (medication induced per family) P:   Follow hepatic panel as needed OGT to int suction Tf can increase  HEMATOLOGIC / ONCOLOGIC A:   Hx stage IV NSLC - on daily  PO chemo (followed by Dr. Julien Nordmann). Pancytopenia - due to chemo. Hx DVT / PE - on chronic apixaban VTE Prophylaxis. P:  Hold outpatient daily iressa  SCD's / Heparin gtt restart as MRi negative Coags. CBC in am , dilutional changes noted scd  INFECTIOUS A:   Septic shock - due to sepsis of urinary source most likely P:   BCx2 7/28 > UCx 7/28 >Kleb pneum ss pip/tazo and ceftriaxone  Sputum Cx 7/28 >  Abx: Vanc, start date 7/28>>>7/30 Abx: Zosyn, start date 7/28>>>7/30 7/30 roc>> Add stop date 8/3  Image renals now for hydro 7/28 >>neg  ENDOCRINE A:   Hypothyroidism, TSH 1.48 7/29 P:   Continue synthroid to po  NEUROLOGIC A:   Acute metabolic encephalopathy - Related to extra doses of opiates vs hypercarbia.  Also exacerbated by persistent hypotension / shock. Ammonia 53 7/31 follows commands weakly New onset seizures r/o mets, hepatic encpeph? MRI neg mets P:   Fentanyl with WUA  Will have a meeting with family - no code blue She is  awake now and wants tube out , will discuss with her her wishes Maintain lactulose eeg reviewed benzo   Oncology  updated  Family updated: Daugher, [name] updated over phone on 7/28.  She requests aggressive measures and insistent on full code.  Interdisciplinary Family Meeting v Palliative Care Meeting:  Due by: February 27, 2015.  Ccm time 30 min   Lavon Paganini. Titus Mould, MD, [place] Pgr: [facility]

## 2015-01-31 NOTE — Telephone Encounter (Signed)
pt dtr called to advised that pt is in ICU at [facility]. appt for 8.2 cx,

## 2015-01-31 NOTE — Progress Notes (Signed)
ANTICOAGULATION CONSULT NOTE - Initial Consult  Pharmacy Consult for Heparin Indication: atrial fibrillation and hx of DVT/PE  Allergies  Allergen Reactions  . Poison Ivy Extract [Extract Of Poison Ivy] Rash  . Sulfa Antibiotics Rash  . Sulfacetamide Sodium Rash     Weight: 194 lb 7.1 oz (88.2 kg)  Heparin Weight: 88.2KG   Vital Signs: Temp: 98.6 F (37 C) (08/02 0723) Temp Source: Oral (08/02 0723) BP: 132/81 mmHg (08/02 0855) Pulse Rate: 117 (08/02 0855)  Labs:  Recent Labs  01/29/15 0300 01/30/15 0525 01/31/15 0430  HGB 10.6* 10.3* 8.8*  HCT 30.2* 29.2* 25.1*  PLT 109* 101* 82*  APTT 73* 91* 36  HEPARINUNFRC 2.10* 1.52*  --   CREATININE 2.53* 2.13* 1.93*    Estimated Creatinine Clearance: 26.5 mL/min (by C-G formula based on Cr of 1.93).  Medications:  Infusions:  . sodium chloride 125 mL/hr at 01/31/15 0849  . fentaNYL infusion INTRAVENOUS 50 mcg/hr (01/31/15 0421)  . heparin     Assessment: 81 yo female presented toED with hypotension. Has a history prior DVTs. Patient was on Apixaban prior to admission for Afib then transitioned to heparin IV. Following aPTTs to monitor decreasing effects of apixaban. Heparin IV was stopped prior to MRI 8/1. Resumed today because MRI negative. Due to pt Hgb will not give bolus. Hgb 8.8, Plt 82  Goal of Therapy:  Heparin level 0.3-0.7 units/ml Monitor platelets by anticoagulation protocol: Yes  APTT 66-102 sec   Plan:  - Restart start infusion at previous therapeutic rate, Heparin 850u/hr - Check HL/aPTT at 1800 - Monitor daily heparin levels, CBC, aPTT  Darl Pikes, PharmD Clinical Pharmacist- Resident Pager: [phone]   01/31/2015 9:14 AM

## 2015-01-31 NOTE — Progress Notes (Signed)
Wasted 190 cc of Fentanyl in the sink with Allegra Grana, RN as witnessed.

## 2015-02-01 DIAGNOSIS — Z4659 Encounter for fitting and adjustment of other gastrointestinal appliance and device: Secondary | ICD-10-CM | POA: Insufficient documentation

## 2015-02-01 DIAGNOSIS — Z87898 Personal history of other specified conditions: Secondary | ICD-10-CM

## 2015-02-01 LAB — CBC
HCT: 30.1 % — ABNORMAL LOW (ref 36.0–46.0)
Hemoglobin: 9.8 g/dL — ABNORMAL LOW (ref 12.0–15.0)
MCH: 34.5 pg — ABNORMAL HIGH (ref 26.0–34.0)
MCHC: 32.6 g/dL (ref 30.0–36.0)
MCV: 106 fL — ABNORMAL HIGH (ref 78.0–100.0)
Platelets: 104 10*3/uL — ABNORMAL LOW (ref 150–400)
RBC: 2.84 MIL/uL — ABNORMAL LOW (ref 3.87–5.11)
RDW: 18.6 % — ABNORMAL HIGH (ref 11.5–15.5)
WBC: 6.3 10*3/uL (ref 4.0–10.5)

## 2015-02-01 LAB — GLUCOSE, CAPILLARY
GLUCOSE-CAPILLARY: 99 mg/dL (ref 65–99)
GLUCOSE-CAPILLARY: 83 mg/dL (ref 65–99)
Glucose-Capillary: 85 mg/dL (ref 65–99)
Glucose-Capillary: 91 mg/dL (ref 65–99)

## 2015-02-01 LAB — HEPARIN LEVEL (UNFRACTIONATED)
HEPARIN UNFRACTIONATED: 0.82 [IU]/mL — AB (ref 0.30–0.70)
HEPARIN UNFRACTIONATED: 0.95 [IU]/mL — AB (ref 0.30–0.70)

## 2015-02-01 LAB — BASIC METABOLIC PANEL
Anion gap: 5 (ref 5–15)
BUN: 40 mg/dL — ABNORMAL HIGH (ref 6–20)
CALCIUM: 8.3 mg/dL — AB (ref 8.9–10.3)
CO2: 27 mmol/L (ref 22–32)
CREATININE: 2.05 mg/dL — AB (ref 0.44–1.00)
Chloride: 113 mmol/L — ABNORMAL HIGH (ref 101–111)
GFR, EST AFRICAN AMERICAN: 25 mL/min — AB (ref >60)
GFR, EST NON AFRICAN AMERICAN: 22 mL/min — AB (ref >60)
GLUCOSE: 92 mg/dL (ref 65–99)
Potassium: 4.9 mmol/L (ref 3.5–5.1)
SODIUM: 145 mmol/L (ref 135–145)

## 2015-02-01 LAB — CULTURE, BLOOD (ROUTINE X 2)
Culture: NO GROWTH
Culture: NO GROWTH

## 2015-02-01 LAB — APTT
APTT: 98 s — AB (ref 24–37)
aPTT: 100 seconds — ABNORMAL HIGH (ref 24–37)

## 2015-02-01 MED ORDER — LORAZEPAM 2 MG/ML IJ SOLN
5.0000 mg/h | INTRAVENOUS | Status: DC
  Administered 2015-02-01: 5 mg/h via INTRAVENOUS
  Filled 2015-02-01: qty 25

## 2015-02-01 MED ORDER — LORAZEPAM BOLUS VIA INFUSION
2.0000 mg | INTRAVENOUS | Status: DC | PRN
  Filled 2015-02-01: qty 5

## 2015-02-01 MED ORDER — MORPHINE BOLUS VIA INFUSION
5.0000 mg | INTRAVENOUS | Status: DC | PRN
  Filled 2015-02-01: qty 20

## 2015-02-01 MED ORDER — MORPHINE SULFATE 25 MG/ML IV SOLN
10.0000 mg/h | INTRAVENOUS | Status: DC
  Administered 2015-02-01 (×2): 10 mg/h via INTRAVENOUS
  Filled 2015-02-01: qty 10

## 2015-02-02 NOTE — Care Management Note (Signed)
Case Management Note  Patient Details  Name: Brittney Thomas MRN: [id number] Date of Birth: 10-17-1932  Subjective/Objective:                    Action/Plan:   Expected Discharge Date:                  Expected Discharge Plan:  Home w Hospice Care  In-House Referral:     Discharge planning Services  CM Consult  Post Acute Care Choice:    Choice offered to:     DME Arranged:    DME Agency:     HH Arranged:    [place] Agency:     Status of Service:  Completed, signed off  Medicare Important Message Given:  Yes-second notification given Date Medicare IM Given:    Medicare IM give by:    Date Additional Medicare IM Given:    Additional Medicare Important Message give by:     If discussed at [place] of Stay Meetings, dates discussed:    Additional Comments:  Vergie Living, RN 02/02/2015, 7:22 AM

## 2015-02-06 ENCOUNTER — Telehealth: Payer: Self-pay

## 2015-02-06 NOTE — Telephone Encounter (Signed)
On 02/06/2015 I received a death certificate from [place]. The death certificate is for cremation. The patient is a patient of Doctor Titus Mould. The death certificate will be taken to the pulmonary unit for signature this pm. On 02/16/15 I received the death certificate back from Doctor [place]. I called the funeral home to let them know it was ready for pickup and also faxed them a copy per their request.

## 2015-02-07 ENCOUNTER — Other Ambulatory Visit: Payer: Medicare Other

## 2015-02-14 ENCOUNTER — Ambulatory Visit: Payer: Medicare Other | Admitting: Internal Medicine

## 2015-02-14 ENCOUNTER — Other Ambulatory Visit: Payer: Medicare Other

## 2015-02-17 ENCOUNTER — Encounter: Payer: Self-pay | Admitting: Pulmonary Disease

## 2015-02-17 ENCOUNTER — Telehealth: Payer: Self-pay | Admitting: Pulmonary Disease

## 2015-02-17 NOTE — Telephone Encounter (Signed)
Daughter Wynelle Beckmann called to request removal of Lung Cancer and the questioned why box was checked yes for "Did tobacco use contribute to death" , she is concerned because she indicates her mother never smoked. I informed her Dr. Halford Chessman via feedback from [facility] would not be honoring the request. She feels strongly about the request and will be completing an request to change the document through the Department of Health and Human Services request form.

## 2015-02-24 ENCOUNTER — Ambulatory Visit: Payer: Medicare Other | Admitting: Cardiology

## 2015-03-02 NOTE — Progress Notes (Signed)
Called POA [name] in regards to patient status change. Notified MD Titus Mould in regards to patient being uncomfortable and labored. MD ordered morphine and ativan IV. Comfort care being initiated.

## 2015-03-02 NOTE — Significant Event (Signed)
Wasted approximately 175cc of fentanyl and 20cc of versed in sink and flush. Waste these with another Curator. Chrstopher Malenfant, Therapist, sports.

## 2015-03-02 NOTE — Progress Notes (Signed)
Chaplain originally responded to consult and spoke with pt at bedside.  Chaplain later returned to comfort family following pt's death.  Chaplain met with family at bedside provided emotional and spiritual support through empathetic listening, prayer and presence.  Chaplain will continue to follow up with family until they leave unit.      02-27-2015 1500  Clinical Encounter Type  Visited With Family  Visit Type Follow-up;Spiritual support;Social support;Death  Referral From Physician  Spiritual Encounters  Spiritual Needs Prayer;Emotional;Grief support  Stress Factors  Family Stress Factors Loss   Geralyn Flash 02/27/2015  3:18 PM

## 2015-03-02 NOTE — Progress Notes (Signed)
ANTICOAGULATION CONSULT NOTE - Follow Up Consult  Pharmacy Consult for Heparin  Indication: A-Fib/Hx VTE  Allergies  Allergen Reactions  . Poison Ivy Extract [Extract Of Poison Ivy] Rash  . Sulfa Antibiotics Rash  . Sulfacetamide Sodium Rash    Patient Measurements: Weight: 194 lb 7.1 oz (88.2 kg)  Vital Signs: Temp: 98.1 F (36.7 C) (08/02 2321) Temp Source: Axillary (08/02 2321) BP: 80/51 mmHg (08/03 0030) Pulse Rate: 129 (08/03 0030)  Labs:  Recent Labs  01/29/15 0300 01/30/15 0525 01/31/15 0430 2015-02-12 0045  HGB 10.6* 10.3* 8.8*  --   HCT 30.2* 29.2* 25.1*  --   PLT 109* 101* 82*  --   APTT 73* 91* 36 100*  HEPARINUNFRC 2.10* 1.52*  --  0.95*  CREATININE 2.53* 2.13* 1.93*  --     Estimated Creatinine Clearance: 26.5 mL/min (by C-G formula based on Cr of 1.93).  Assessment: Therapeutic aPTT (using aPTT to dose for now given apixaban influence on anti-Xa level).   Goal of Therapy:  Heparin level 0.3-0.7 units/ml aPTT 66-102 seconds Monitor platelets by anticoagulation protocol: Yes   Plan:  -Continue heparin at 850 units/hr -0900 aPTT/HL -Daily CBC/aPTT/HL -Begin using HL when correlating with aPTT -Trend Hgb  Narda Bonds 2015/02/12,2:23 AM

## 2015-03-02 NOTE — Progress Notes (Signed)
Pt passed at 1418. Pupils non-reactive to light, breath and heart sounds absent. No pulse palpable. This was verified along side Water engineer. Family at beside and aware of status. Elink RN Clarise Cruz Adome notified of situation.

## 2015-03-02 NOTE — Discharge Summary (Signed)
NAMEFREDRIKA, Brittney Thomas NO.:  [id number]  MEDICAL RECORD NO.:  [id number]  LOCATION:  2M02C                        FACILITY:  [facility]  PHYSICIAN:  Raylene Miyamoto, MD DATE OF BIRTH:  [birth year]  DATE OF ADMISSION:  01/27/2015 DATE OF DISCHARGE:  02/16/2015                              DISCHARGE SUMMARY   DEATH SUMMARY  This is an 81 year old female, brought to the [facility] on January 26, 2015, with hypertension and acute change in mental status.  The patient has a significant history of non-small cell lung cancer, on chemotherapy daily, with past medical history additionally of hypertension, thyroid disease, COPD, __________ pulmonary embolism in 2008, and significant pulmonary hypertension, followed by Dr. Roselie Awkward for chronic respiratory failure.  Essentially on the 28th, the patient was found to be lethargic.  She had a recent admission on Nov 28, 2013, secondary to shortness of breath __________ aspiration, and had a urinary tract infection.  The patient was readmitted this time on January 26, 2015, secondary to urinary tract infection causing septic shock.  She had undergone on January 28, 2015, CT scan of abdomen and pelvis showing overload effusions and cirrhosis.  The patient had a central line placed on the left side on January 26, 2015, as well. Hospital course is significant also on August 1st and 2nd having seizures concerning at that time for __________.  The patient's hospital course was found to have an MRI negative for any metastatic disease in the brain.  The patient was being treated with heparin for the history of PE and DVT.  She had been on chronic Apixaban, which was held.  The patient was treated aggressively with antibiotics in terms of vancomycin and Zosyn, and then deescalated down to Rocephin.  She also noted to have an ultrasound of kidneys, negative for hydronephrosis.  The patient was improving to some extent with her  acute renal failure.  Discussions were had to plan extubation with the daughter.  The patient was extubated after some slight improvement.  The patient was awake and alert and oriented, was able to make decision not to have aggressive heroic measures any further.  She declined throughout the night with worsening mucous plugging, atelectasis likely, and worsening distress. Comfort care was provided for her, and she expired.  FINAL DIAGNOSES UPON DEATH: 1. Urinary tract infection. 2. Septic shock. 3. Stage IV non-small lung cancer. 4. Atelectasis secondary to likely mucus plugging, rule out HCAP. 5. Acute renal failure secondary to, acute tubular necrosis,     hypovolemia; improved. 6. Severe pulmonary hypertension with a history of chronic obstructive     pulmonary disease. 7. History of deep venous thrombosis and pulmonary embolism. 8. Seizure. 9. Palliative care provided by the Critical Care Team.     Raylene Miyamoto, MD     DJF/MEDQ  D:  02/13/2015  T:  02/14/2015  Job:  [id number]

## 2015-03-02 NOTE — Progress Notes (Signed)
PT Cancellation Note  Patient Details Name: Brittney Thomas MRN: [id number] DOB: [birth year]   Cancelled Treatment:    Reason Eval/Treat Not Completed: Patient not medically ready. Discussed pt case with RN who states that pt has been intermittently arousable this morning. RN states may be moving towards a comfort approach. Will hold PT eval at this time and check back for appropriateness to participate with therapy.    Rolinda Roan 2015-02-05, 9:38 AM   Rolinda Roan, PT, DPT Acute Rehabilitation Services Pager: [phone]

## 2015-03-02 NOTE — Progress Notes (Signed)
Notified and made referral to CDS.

## 2015-03-02 NOTE — Progress Notes (Signed)
PULMONARY / CRITICAL CARE MEDICINE   Name: Brittney Thomas MRN: [id number] DOB: 01-27-1933    ADMISSION DATE:  01/20/2015 CONSULTATION DATE:  March 03, 2015  REFERRING MD :  EDP  CHIEF COMPLAINT:  Hypotension  INITIAL PRESENTATION:  81 y.o. F  [facility] with hypotension and AMS.  PCCM called for admission given persistent hypotension despite 2.5 L IVF. Patient intubated, central line placed  BRIEF PATIENT DESCRIPTION: Brittney Thomas is an 81 y.o. F with NSCLD on po chemo daily, with PMH of HTN, thyroid dz, COPD, VCD, PE 2008, pulm HTN. She is followed by Dr. Lake Bells for chronic respiratory failure. She was brought to [facility] ED 01/16/2015 due to being lethargic Of note, she had recent admission 11/29/14 through 12/09/14 for SOB thought due to aspiration and klebsiella UTI. Additionally, she has hx of stage 4 NSCLC (followed by Dr. Julien Nordmann) and is on daily PO chemo.  STUDIES:  CXR 7/28 >>> CM with early edema.  Basilar ATX. 7/30 ct abdo/pelvis>>>overlaod, effusion rt, cirrhosis 8/1 eeg>>no focus, gen slowing  SIGNIFICANT EVENTS: 7/28 - admitted with hypotension / shock. 8/1- seizure 8/2- pt decided no escalation, ett out  LINES/DRAINS: 7/28 ETT >> 7/28 L IJ Central line >>  SUBJECTIVE:  Distress on 100% NRB, tachy  VITAL SIGNS: Temp:  [97.5 F (36.4 C)-98.9 F (37.2 C)] 97.9 F (36.6 C) (08/03 0722) Pulse Rate:  [82-152] 120 (08/03 0900) Resp:  [13-38] 15 (08/03 0900) BP: (73-133)/(46-90) 79/52 mmHg (08/03 0900) SpO2:  [89 %-100 %] 89 % (08/03 1112) FiO2 (%):  [30 %] 30 % (08/02 1600) Weight:  [90.4 kg (199 lb 4.7 oz)] 90.4 kg (199 lb 4.7 oz) (08/03 0500) HEMODYNAMICS:   VENTILATOR SETTINGS: Vent Mode:  [-] PRVC FiO2 (%):  [30 %] 30 % Set Rate:  [16 bmp] 16 bmp Vt Set:  [500 mL] 500 mL PEEP:  [8 cmH20] 8 cmH20 Plateau Pressure:  [20 cmH20] 20 cmH20 INTAKE / OUTPUT: Intake/Output      08/02 0701 - 08/03 0700 08/03 0701 - 08/04 0700   I.V. (mL/kg) 3114.5 (34.5) 267 (3)    NG/GT 40    IV Piggyback 255    Total Intake(mL/kg) 3409.5 (37.7) 267 (3)   Urine (mL/kg/hr) 680 (0.3) 35 (0.1)   Stool  0 (0)   Total Output 680 35   Net +2729.5 +232        Stool Occurrence  1 x     PHYSICAL EXAMINATION: General: Chronically ill appearing female, anxious, severe distress  Neuro: following commands intermittent, perr 8m HEENT: jvd up Cardiovascular: IRtachy, no M/R/G.  Lungs: ronchi diffue severe Abdomen: abdomen distended central predominant, soft on deep palpation, present BS Musculoskeletal: BLE edema and erythema / venous stasis changes likley Skin: Intact, warm  LABS:  CBC  Recent Labs Lab 01/30/15 0525 01/31/15 0430 [id number]  WBC 4.7 3.6* 6.3  HGB 10.3* 8.8* 9.8*  HCT 29.2* 25.1* 30.1*  PLT 101* 82* 104*   Coag's  Recent Labs Lab 01/31/15 0430 [id number]  APTT 36 100* 98*   BMET  Recent Labs Lab 01/30/15 0525 01/31/15 0430 [id number], [id number]  NA 139 142 145  K 3.2* 3.9 4.9  CL 105 106 113*  CO2 '28 26 27  '$ BUN 46* 39* 40*  CREATININE 2.13* 1.93* 2.05*  GLUCOSE 98 102* 92   Electrolytes  Recent Labs Lab 01/27/15 0443 01/29/15 0300 01/30/15 0525 01/31/15 0430 [id number]  CALCIUM 9.2 8.3* 8.2* 8.1* 8.3*  MG 2.0 1.7 1.8  --   --   PHOS 4.7* 3.0 2.9  --   --    Sepsis Markers  Recent Labs Lab 01/27/15 0225 01/27/15 1115 01/28/15 1043  LATICACIDVEN 3.9* 2.0 1.3   ABG  Recent Labs Lab 01/27/15 0140  PHART 7.522*  PCO2ART 45.5*  PO2ART 313.0*   Liver Enzymes  Recent Labs Lab 01/17/2015 1745 01/31/15 0430  AST 39 24  ALT 21 13*  ALKPHOS 144* 85  BILITOT 1.4* 1.4*  ALBUMIN 3.3* 2.2*   Cardiac Enzymes No results for input(s): TROPONINI, PROBNP in the last 168 hours. Glucose  Recent Labs Lab 01/31/15 0721 01/31/15 1129 01/31/15 1522 01/31/15 1915 02-06-15 0456 2015/02/06 0718  GLUCAP 100* 104* 98 114* 85 83    Imaging No results found.   ASSESSMENT /  PLAN:  PULMONARY A: Acute on chronic hypoxic respiratory failure requiring mechanical ventilation Stage IV NSCLC - on daily  PO chemo (followed by Dr. Julien Nordmann).   PAH (PASP 31 by echo 12/09/14). COPD by report - no PFT's on file. collapse rml, rll - improved peep 10  Severe distress this am , tachy, she is passing P:   She made it clear that she does not want heroics She is dying active now Start morphine, ativan ( seizure) Titrate to rr 12-18 Once comfortable , back to nasal cannula, high flow O2 will prolong suffering To floor once comfortable   Lavon Paganini. Titus Mould, MD, [place] Pgr: [place]

## 2015-03-02 DEATH — deceased

## 2015-05-01 IMAGING — CT CT BIOPSY
1 of 3 series · 13 of 32 positions shown, 19 images · non-contrast
Comparison: none

CLINICAL DATA: Left upper lobe PET positive nodule

[Series 2: i-spiral 5.0 b40f · axial · 0.92mm/px · z∈[+1281,+1442]mm · 13 of 54 slices shown, 19 images]
[im 4/54  soft-tissue]
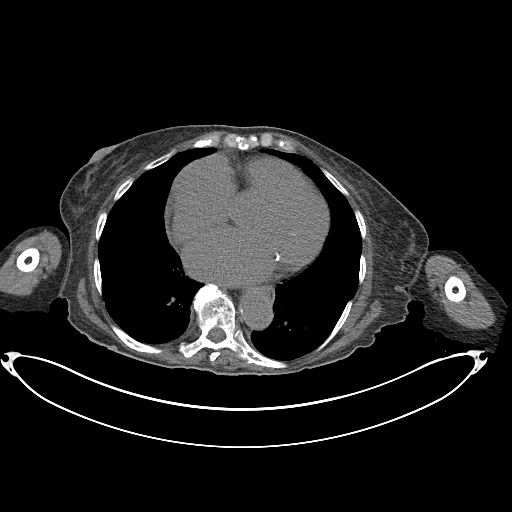
[im 4/54  bone]
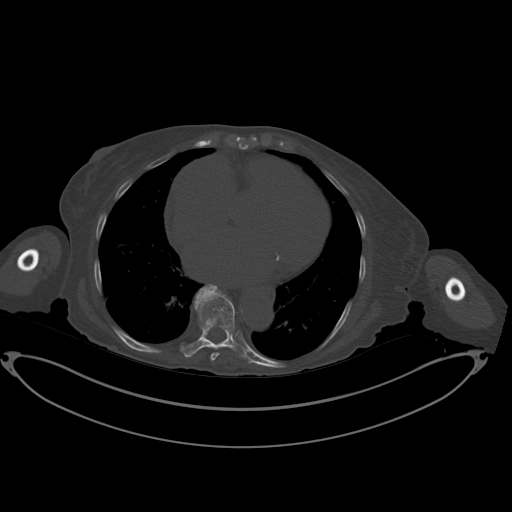
[im 8/54  soft-tissue]
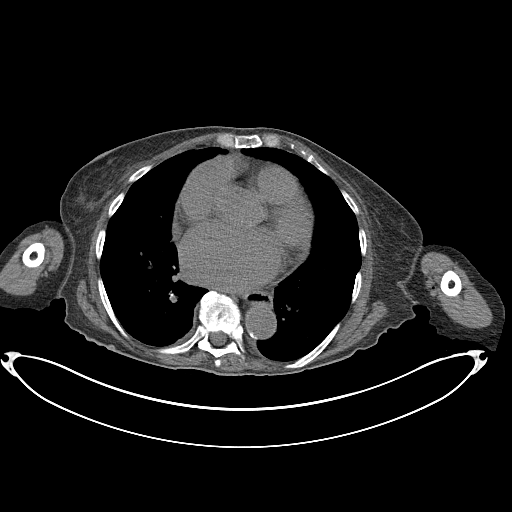
[im 12/54  soft-tissue]
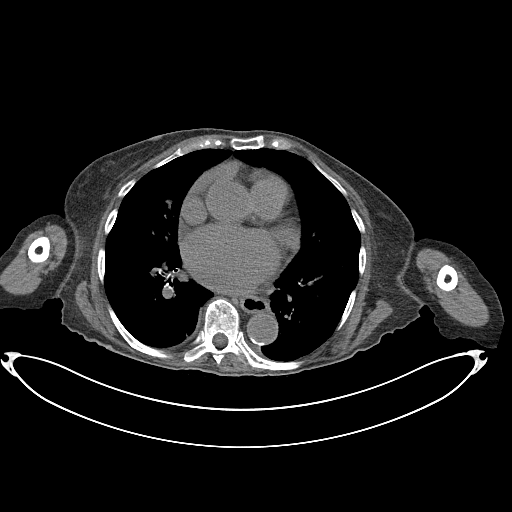
[im 16/54  soft-tissue]
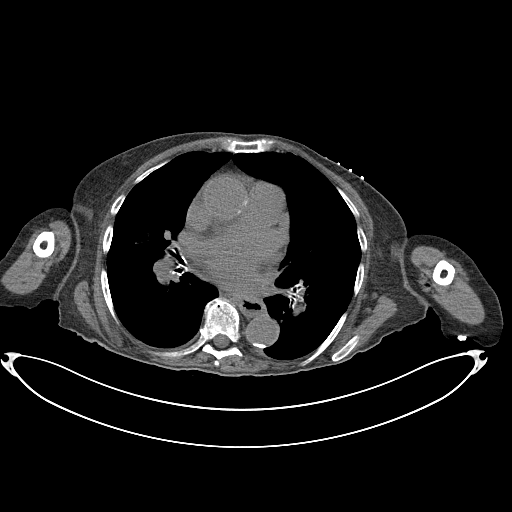
[im 19/54  soft-tissue]
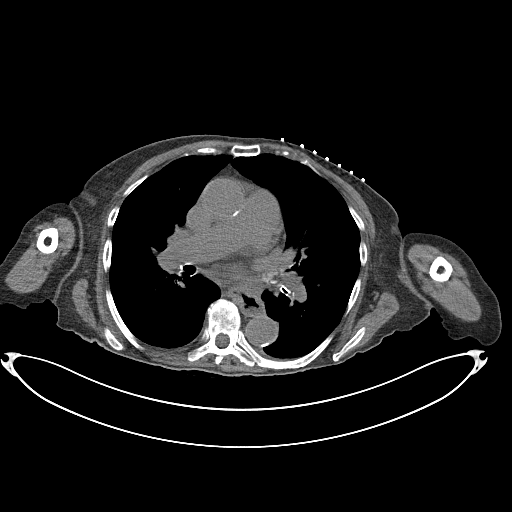
[im 23/54  soft-tissue]
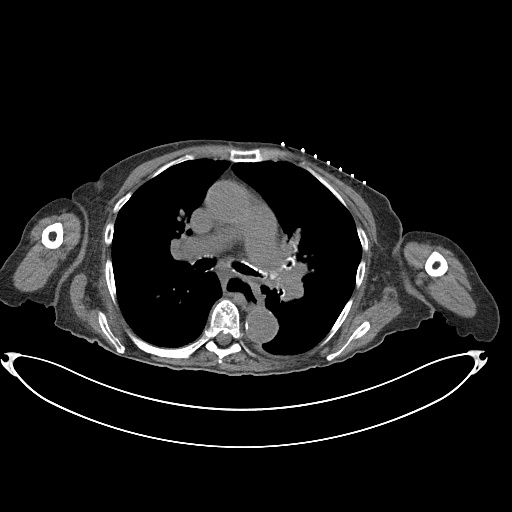
[im 27/54  soft-tissue]
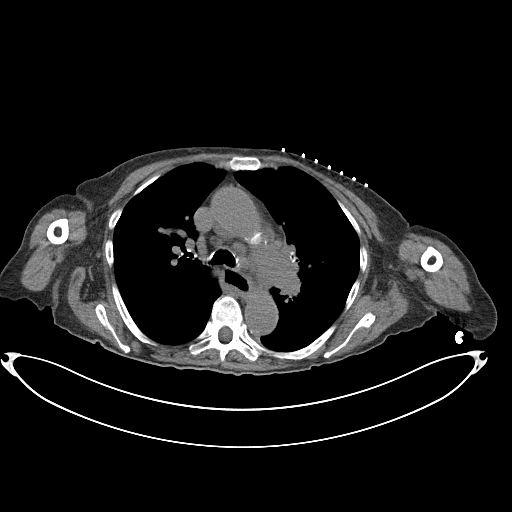
[im 31/54  soft-tissue]
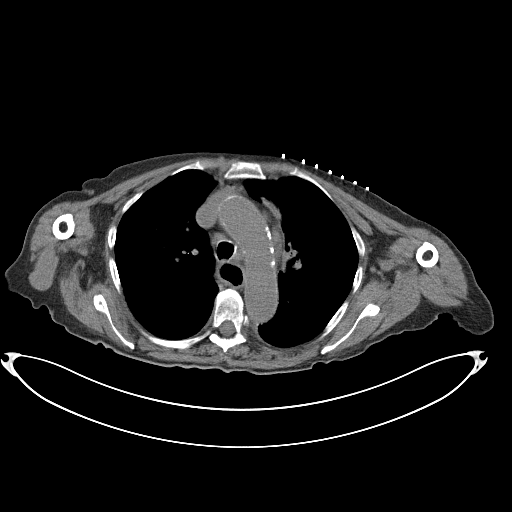
[im 35/54  soft-tissue]
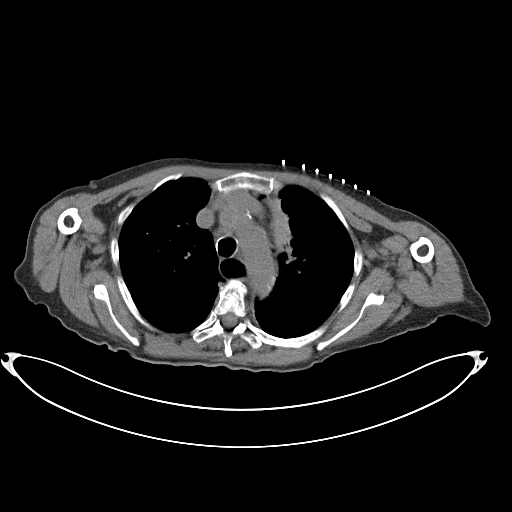
[im 35/54  bone]
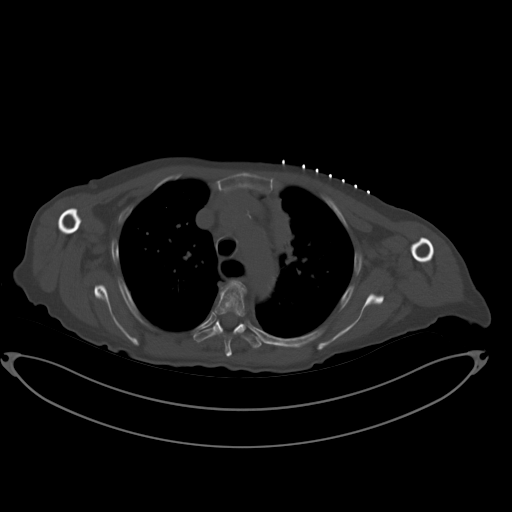
[im 38/54  soft-tissue]
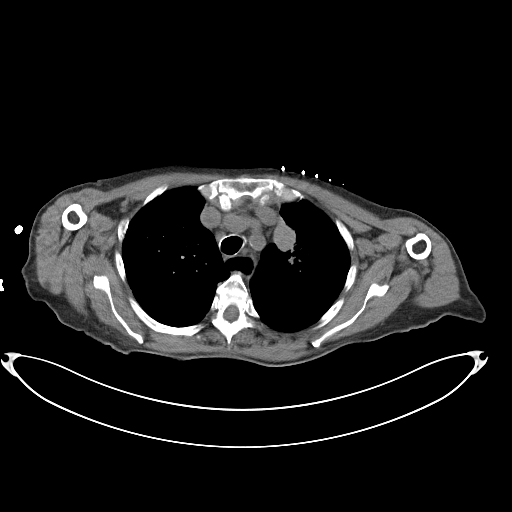
[im 38/54  lung]
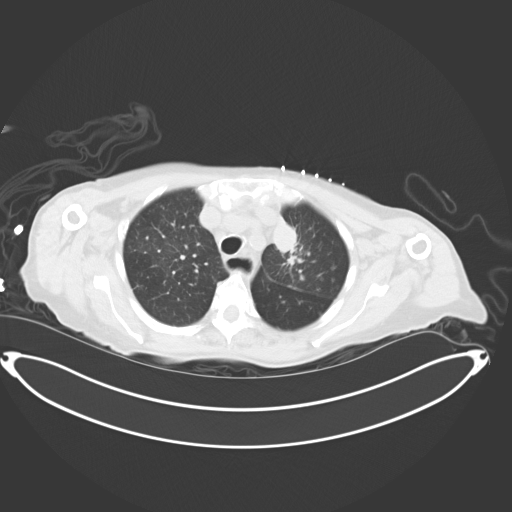
[im 42/54  soft-tissue]
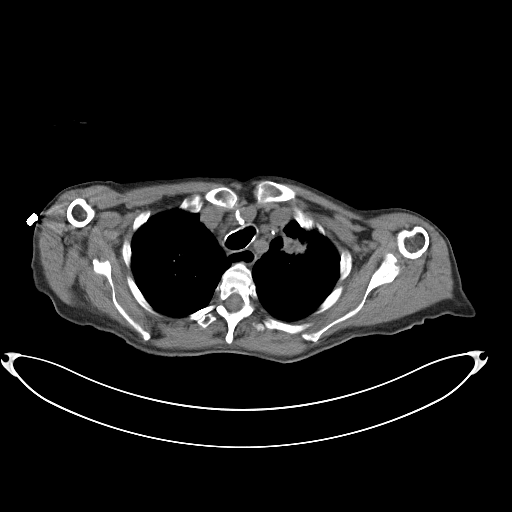
[im 42/54  lung]
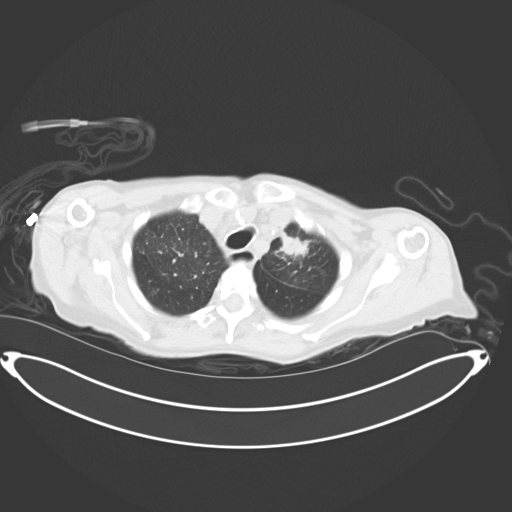
[im 46/54  soft-tissue]
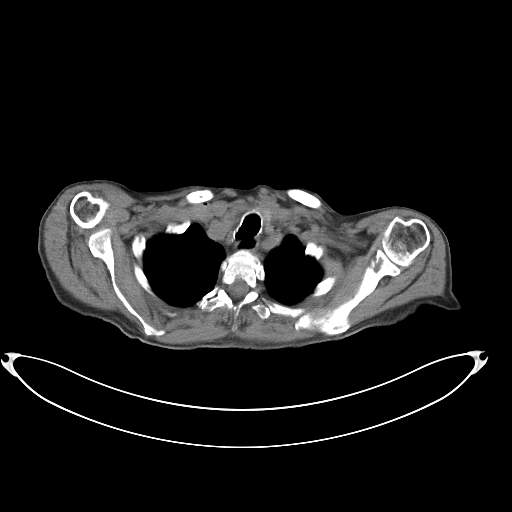
[im 46/54  lung]
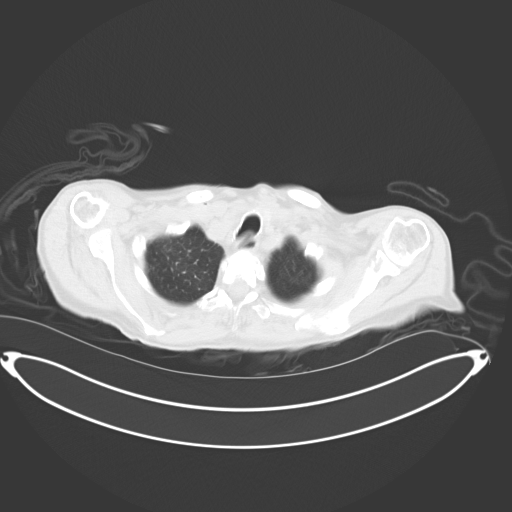
[im 50/54  soft-tissue]
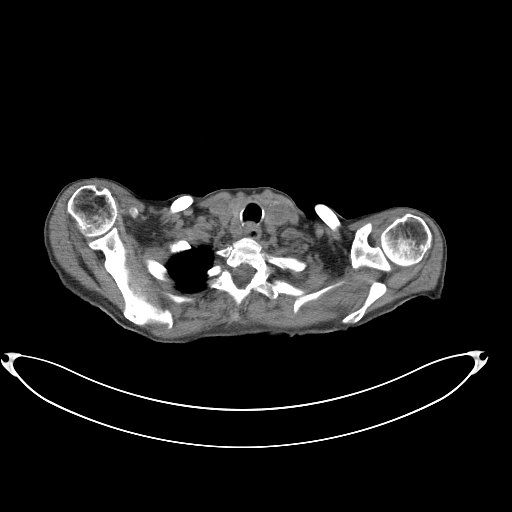
[im 50/54  lung]
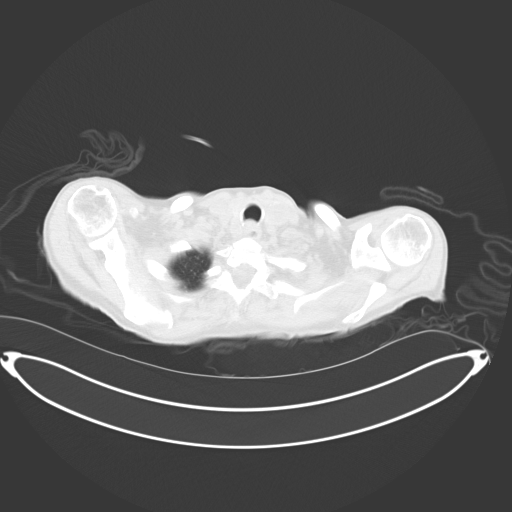

[13 of 32 positions shown; findings below may reference images not displayed]

EXAM:
CT GUIDED CORE BIOPSY OF LEFT UPPER LOBE NODULE

ANESTHESIA/SEDATION:
0.5  Mg IV Versed; 25 mcg IV Fentanyl

Total Moderate Sedation Time: 15 minutes.

PROCEDURE:
The procedure risks, benefits, and alternatives were explained to
the patient. Questions regarding the procedure were encouraged and
answered. The patient understands and consents to the procedure.

The left anterior chest was prepped with Betadinein a sterile
fashion, and a sterile drape was applied covering the operative
field. A sterile gown and sterile gloves were used for the
procedure. Local anesthesia was provided with 1% Lidocaine.

Previous imaging reviewed. Patient positioned supine. Noncontrast
localization CT performed. Anterior left upper lobe spiculated
nodule was localized. Under sterile conditions and local anesthesia,
a 17 gauge 6.8 cm access needle was advanced from an anterior
intercostal approach to the left upper lobe nodule. Needle position
confirmed with CT. [DATE] gauge core biopsies obtained.

To aide in the pneumothorax prevention, the [employer] tract sealant
device was utilized upon needle removal.

Postprocedure imaging demonstrates no significant hemorrhage,
effusion or pneumothorax.

Complications: None immediate
FINDINGS: Imaging confirms needle placement into the left upper lobe mass for
core biopsy
IMPRESSION: Successful CT-guided left upper lobe mass 18 gauge core biopsies.

## 2015-05-01 IMAGING — CR DG CHEST 1V PORT
1 series · 1 of 1 positions shown · non-contrast
Comparison: Correlation with biopsy chest CT from the same day.

CLINICAL DATA: 81-year-old with left upper lobe mass

EXAM:
PORTABLE CHEST - 1 VIEW

[AP]
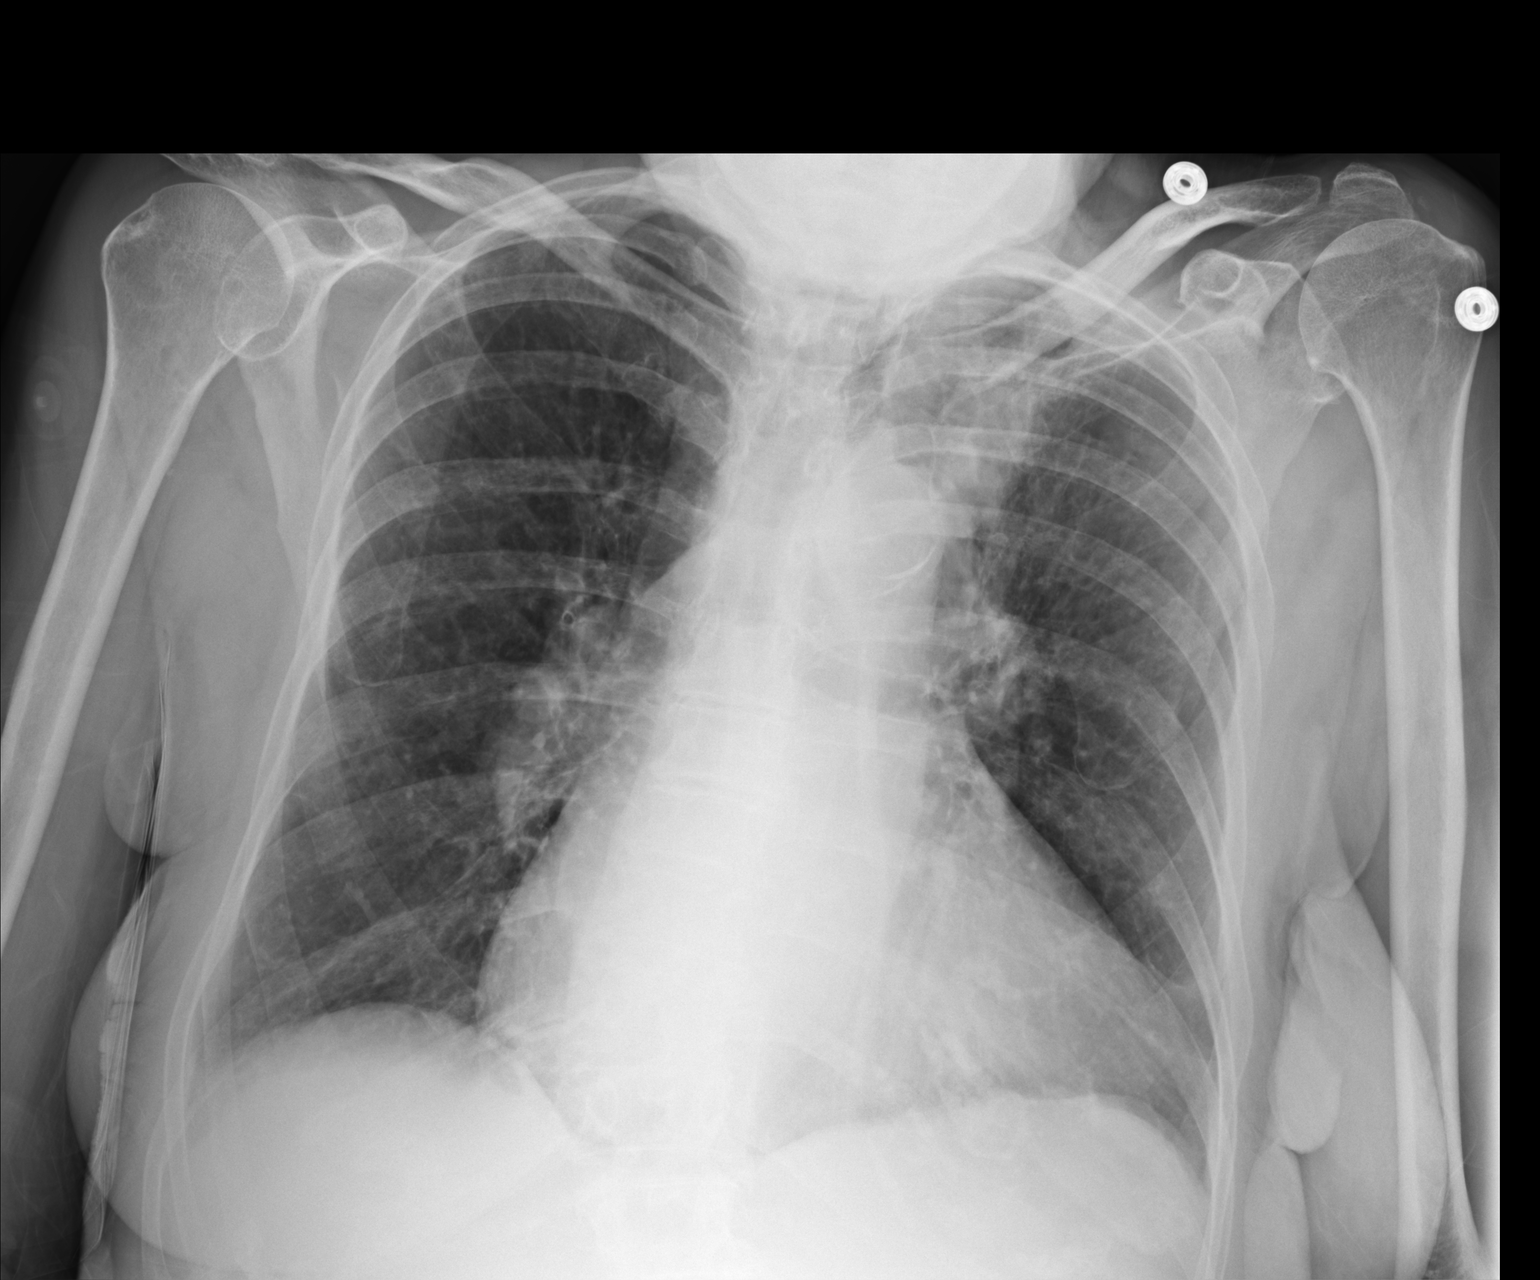

[1 of 1 positions shown; findings below may reference images not displayed]

FINDINGS: The cardiac silhouette is enlarged. Mediastinal contours are
unchanged. Atherosclerotic aortic calcifications are present

A in the medial aspect of the left upper lobe a focal opacity/mass
is noted. There is no pneumothorax status post biopsy of this mass.
Linear opacities in the lung bases likely correspond to atelectasis.
There is no pleural effusion. There is no overt pulmonary edema.

There is no acute osseous abnormality.
IMPRESSION: No pneumothorax status post left upper lobe mass biopsy.
